# Patient Record
Sex: Female | Born: 1935 | Race: White | Hispanic: No | Smoking: Never smoker
Health system: Southern US, Community
[De-identification: ages and names within clinical notes are randomized; demographics above are authoritative.]

## PROBLEM LIST (undated history)

## (undated) ENCOUNTER — Ambulatory Visit (HOSPITAL_COMMUNITY): Disposition: A | Discharge status: Home / Self Care | Payer: Medicare Other

## (undated) DIAGNOSIS — I5032 Chronic diastolic (congestive) heart failure: Secondary | ICD-10-CM

## (undated) DIAGNOSIS — E039 Hypothyroidism, unspecified: Secondary | ICD-10-CM

## (undated) DIAGNOSIS — R32 Unspecified urinary incontinence: Secondary | ICD-10-CM

## (undated) DIAGNOSIS — R609 Edema, unspecified: Secondary | ICD-10-CM

## (undated) DIAGNOSIS — M1990 Unspecified osteoarthritis, unspecified site: Secondary | ICD-10-CM

## (undated) DIAGNOSIS — I2699 Other pulmonary embolism without acute cor pulmonale: Secondary | ICD-10-CM

## (undated) DIAGNOSIS — I10 Essential (primary) hypertension: Secondary | ICD-10-CM

## (undated) DIAGNOSIS — E119 Type 2 diabetes mellitus without complications: Secondary | ICD-10-CM

## (undated) DIAGNOSIS — E785 Hyperlipidemia, unspecified: Secondary | ICD-10-CM

## (undated) DIAGNOSIS — Z974 Presence of external hearing-aid: Secondary | ICD-10-CM

## (undated) DIAGNOSIS — N183 Chronic kidney disease, stage 3 unspecified: Secondary | ICD-10-CM

## (undated) HISTORY — DX: Edema, unspecified: R60.9

## (undated) HISTORY — DX: Presence of external hearing-aid: Z97.4

## (undated) HISTORY — DX: Other pulmonary embolism without acute cor pulmonale: I26.99

## (undated) HISTORY — PX: EYE SURGERY: SHX253

## (undated) HISTORY — PX: CHOLECYSTECTOMY: SHX55

## (undated) HISTORY — DX: Hypothyroidism, unspecified: E03.9

---
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
## 1997-08-15 HISTORY — PX: OTHER SURGICAL HISTORY: SHX169

## 1998-03-16 ENCOUNTER — Ambulatory Visit (HOSPITAL_COMMUNITY): Admission: RE | Admit: 1998-03-16 | Discharge: 1998-03-16 | Payer: Self-pay | Admitting: Orthopedic Surgery

## 1998-04-03 ENCOUNTER — Inpatient Hospital Stay (HOSPITAL_COMMUNITY): Admission: RE | Admit: 1998-04-03 | Discharge: 1998-04-05 | Payer: Self-pay | Admitting: Orthopedic Surgery

## 1998-08-15 HISTORY — PX: BLADDER SURGERY: SHX569

## 1998-08-18 ENCOUNTER — Other Ambulatory Visit: Admission: RE | Admit: 1998-08-18 | Discharge: 1998-08-18 | Payer: Self-pay | Admitting: Internal Medicine

## 1999-03-01 ENCOUNTER — Encounter: Admission: RE | Admit: 1999-03-01 | Discharge: 1999-03-15 | Payer: Self-pay | Admitting: Internal Medicine

## 1999-03-11 ENCOUNTER — Encounter: Admission: RE | Admit: 1999-03-11 | Discharge: 1999-03-15 | Payer: Self-pay | Admitting: Internal Medicine

## 1999-03-16 ENCOUNTER — Encounter: Admission: RE | Admit: 1999-03-16 | Discharge: 1999-06-14 | Payer: Self-pay | Admitting: Internal Medicine

## 2000-06-11 ENCOUNTER — Emergency Department (HOSPITAL_COMMUNITY): Admission: EM | Admit: 2000-06-11 | Discharge: 2000-06-11 | Payer: Self-pay | Admitting: Emergency Medicine

## 2001-02-28 ENCOUNTER — Encounter (HOSPITAL_BASED_OUTPATIENT_CLINIC_OR_DEPARTMENT_OTHER): Payer: Self-pay | Admitting: Internal Medicine

## 2001-02-28 ENCOUNTER — Encounter: Admission: RE | Admit: 2001-02-28 | Discharge: 2001-02-28 | Payer: Self-pay | Admitting: Internal Medicine

## 2001-10-24 ENCOUNTER — Encounter: Admission: RE | Admit: 2001-10-24 | Discharge: 2002-01-22 | Payer: Self-pay | Admitting: Internal Medicine

## 2002-04-24 ENCOUNTER — Encounter: Admission: RE | Admit: 2002-04-24 | Discharge: 2002-04-24 | Payer: Self-pay | Admitting: Internal Medicine

## 2002-04-24 ENCOUNTER — Encounter: Payer: Self-pay | Admitting: Internal Medicine

## 2002-05-20 ENCOUNTER — Encounter: Payer: Self-pay | Admitting: Urology

## 2002-05-23 ENCOUNTER — Encounter (INDEPENDENT_AMBULATORY_CARE_PROVIDER_SITE_OTHER): Payer: Self-pay | Admitting: Specialist

## 2002-05-23 ENCOUNTER — Observation Stay (HOSPITAL_COMMUNITY): Admission: RE | Admit: 2002-05-23 | Discharge: 2002-05-24 | Payer: Self-pay | Admitting: Urology

## 2006-06-20 ENCOUNTER — Encounter: Admission: RE | Admit: 2006-06-20 | Discharge: 2006-06-20 | Payer: Self-pay | Admitting: Neurology

## 2006-09-08 ENCOUNTER — Encounter: Admission: RE | Admit: 2006-09-08 | Discharge: 2006-09-29 | Payer: Self-pay | Admitting: Neurology

## 2006-10-10 ENCOUNTER — Encounter: Admission: RE | Admit: 2006-10-10 | Discharge: 2007-01-08 | Payer: Self-pay | Admitting: Neurology

## 2006-11-24 ENCOUNTER — Encounter: Admission: RE | Admit: 2006-11-24 | Discharge: 2006-11-24 | Payer: Self-pay | Admitting: Neurology

## 2009-04-22 ENCOUNTER — Encounter: Admission: RE | Admit: 2009-04-22 | Discharge: 2009-07-21 | Payer: Self-pay | Admitting: Orthopaedic Surgery

## 2009-07-23 ENCOUNTER — Encounter: Admission: RE | Admit: 2009-07-23 | Discharge: 2009-10-02 | Payer: Self-pay | Admitting: Orthopaedic Surgery

## 2009-08-03 ENCOUNTER — Encounter: Admission: RE | Admit: 2009-08-03 | Discharge: 2009-08-17 | Payer: Self-pay | Admitting: Orthopaedic Surgery

## 2009-08-28 ENCOUNTER — Encounter: Admission: RE | Admit: 2009-08-28 | Discharge: 2009-11-26 | Payer: Self-pay | Admitting: Orthopaedic Surgery

## 2010-03-22 ENCOUNTER — Ambulatory Visit (HOSPITAL_COMMUNITY): Admission: RE | Admit: 2010-03-22 | Discharge: 2010-03-23 | Payer: Self-pay | Admitting: Surgery

## 2010-03-22 ENCOUNTER — Encounter (INDEPENDENT_AMBULATORY_CARE_PROVIDER_SITE_OTHER): Payer: Self-pay | Admitting: Surgery

## 2010-10-29 LAB — GLUCOSE, CAPILLARY
Glucose-Capillary: 163 mg/dL — ABNORMAL HIGH (ref 70–99)
Glucose-Capillary: 181 mg/dL — ABNORMAL HIGH (ref 70–99)
Glucose-Capillary: 187 mg/dL — ABNORMAL HIGH (ref 70–99)

## 2010-10-30 LAB — DIFFERENTIAL
Basophils Absolute: 0 10*3/uL (ref 0.0–0.1)
Basophils Relative: 1 % (ref 0–1)
Eosinophils Absolute: 0.2 10*3/uL (ref 0.0–0.7)
Eosinophils Relative: 2 % (ref 0–5)
Lymphs Abs: 1.9 10*3/uL (ref 0.7–4.0)
Neutrophils Relative %: 64 % (ref 43–77)

## 2010-10-30 LAB — CBC
HCT: 37.4 % (ref 36.0–46.0)
Hemoglobin: 12.7 g/dL (ref 12.0–15.0)
MCHC: 33.9 g/dL (ref 30.0–36.0)
RBC: 4.16 MIL/uL (ref 3.87–5.11)

## 2010-10-30 LAB — URINALYSIS, ROUTINE W REFLEX MICROSCOPIC
Bilirubin Urine: NEGATIVE
Glucose, UA: NEGATIVE mg/dL
Ketones, ur: NEGATIVE mg/dL
Specific Gravity, Urine: 1.02 (ref 1.005–1.030)
pH: 5.5 (ref 5.0–8.0)

## 2010-10-30 LAB — COMPREHENSIVE METABOLIC PANEL
ALT: 15 U/L (ref 0–35)
AST: 17 U/L (ref 0–37)
Alkaline Phosphatase: 65 U/L (ref 39–117)
CO2: 31 mEq/L (ref 19–32)
Calcium: 10.8 mg/dL — ABNORMAL HIGH (ref 8.4–10.5)
Chloride: 108 mEq/L (ref 96–112)
GFR calc Af Amer: 49 mL/min — ABNORMAL LOW (ref >60)
GFR calc non Af Amer: 40 mL/min — ABNORMAL LOW (ref >60)
Glucose, Bld: 114 mg/dL — ABNORMAL HIGH (ref 70–99)
Sodium: 144 mEq/L (ref 135–145)
Total Bilirubin: 0.9 mg/dL (ref 0.3–1.2)

## 2010-10-30 LAB — SURGICAL PCR SCREEN: Staphylococcus aureus: NEGATIVE

## 2010-10-30 LAB — LIPASE, BLOOD: Lipase: 36 U/L (ref 11–59)

## 2010-12-31 NOTE — Op Note (Signed)
NAME:  Melissa Shannon, Melissa Shannon                          ACCOUNT NO.:  [id number]   MEDICAL RECORD NO.:  [id number]                   PATIENT TYPE:  OBV   LOCATION:  [id number]                                 FACILITY:  [facility]   PHYSICIAN:  Arlean Hopping, M.D.                DATE OF BIRTH:  [birth year]   DATE OF PROCEDURE:  05/23/2002  DATE OF DISCHARGE:                                 OPERATIVE REPORT   PREOPERATIVE DIAGNOSES:  1. Urinary stress incontinence.  2. Vaginal vault prolapse.   POSTOPERATIVE DIAGNOSES:  Not given.   PROCEDURE:  1. SPARC urethral sling.  2. Anterior vaginal vault repair.  3. Excision of cervical polyp.  4. Excision of vaginal polyp.   ANESTHESIA:  General.   SURGEON:  Sigmund I. Gaynelle Arabian, M.D.   ASSISTANT:  Arlean Hopping, M.D.   BRIEF HISTORY:  Melissa Shannon is a 77 year old white female with stress urinary  incontinence and a grade 3 cystocele who has elected for open repair.   DESCRIPTION OF PROCEDURE:  Following the administration of IV antibiotics  and general anesthesia, Melissa Shannon was prepped and draped in the dorsal  lithotomy position. A weighted speculum was placed in the vagina to pull  down the posterior vaginal wall and expose the anterior vaginal wall and  cervix. A Foley catheter was placed sterilely. The Foley catheter balloon  was palpable at the bladder neck through the anterior vaginal wall. At that  time, I began the urethral sling procedure by making a longitudinal incision  in the anterior vaginal mucosa starting proximally at the bladder neck and  extending distally for 1 1/2 cm. A plane was then dissected out submucosally  laterally on both sides of this incision around the urethra. This was  achieved by both blunt and sharp dissection. This was uncomplicated. We then  proceeded to make two small suprapubic incisions at each superior lateral  aspect of the pubic symphysis. These are made sharply using a 10 blade  through the skin. We  then proceeded to pass curved suspension needles from  the abdominal incision down directed out through the previously made  dissections at the anterior vaginal wall. Both the left and right needles  were passed without difficulty. Following passage of the needles, cystoscopy  was performed using both 30 and 70 degree lenses. There was no evidence of  any violation of the bladder by the needles. At that time, the [facility] vaginal  tape was connected to each of the suspension needles. They were both then  slowly pulled upward to the site of both suprapubic skin incisions. They  were pulled upward until the vaginal tape was flushed tension free allowing  passage of a ring clamp between the urethra and the tape. The sleeve of the  tape were then removed while carefully keeping the tape in place. Each end  of the tape were then cut beneath the level of  the skin incisions. A small  piece of cadaveric fascia was then placed over the tape and the mucosal  incision was closed using running 2-0 Vicryl suture. The abdominal incisions  were approximated using Benzoin and Steri-Strips. We then began our vaginal  vault repair by making a longitudinal incision along the anterior bladder  wall starting just distally to the previously made incision. This incision  was approximately 3 cm in length. Then using blunt and sharp dissection, the  cystocele was completely dissected off the overlying vaginal submucosal  tissue creating a good plane circumferentially. This allowed [place] to then  reduce the cystocele by gently pushing it back up into the pelvis. Vertical  mattress sutures were then used to pex the cystocele up in place, this was  performed using the 2-0 Vicryl. While continuing to reduce the cystocele,  the overlying submucosal tissues were reapproximated to keep the cystocele  reduced using mattress sutures with 2-0 Vicryl. Again at that time, the  suture line was covered with a soft piece of cadaveric  fascia which was  sutured in its lateral aspect to the vaginal submucosa. The vaginal mucosa  was then closed using a running 2-0 Vicryl suture. During the course of the  procedure, a small polyp was seen emanating from the cervix. This was  removed using sharp dissection and the stalk was suture ligated.  Additionally we noted a mucosal polyp within the vagina which was removed in  a similar fashion. Both of these were sent to pathology for evaluation. At  the end of the procedure, the vagina was tacked and the Foley catheter was  kept in place.   ESTIMATED BLOOD LOSS:  Minimal.   COMPLICATIONS:  None.   DRAINS:  Foley catheter.   PACKS:  Vaginal packing.   DISPOSITION:  She was taken to the recovery room in stable condition.                                               Arlean Hopping, M.D.    DK/MEDQ  D:  05/23/2002  T:  05/23/2002  Job:  [id number]

## 2011-09-26 DIAGNOSIS — E538 Deficiency of other specified B group vitamins: Secondary | ICD-10-CM | POA: Diagnosis not present

## 2011-09-26 DIAGNOSIS — IMO0001 Reserved for inherently not codable concepts without codable children: Secondary | ICD-10-CM | POA: Diagnosis not present

## 2011-11-01 DIAGNOSIS — E538 Deficiency of other specified B group vitamins: Secondary | ICD-10-CM | POA: Diagnosis not present

## 2011-11-01 DIAGNOSIS — E1149 Type 2 diabetes mellitus with other diabetic neurological complication: Secondary | ICD-10-CM | POA: Diagnosis not present

## 2011-11-01 DIAGNOSIS — G589 Mononeuropathy, unspecified: Secondary | ICD-10-CM | POA: Diagnosis not present

## 2011-12-01 DIAGNOSIS — E1149 Type 2 diabetes mellitus with other diabetic neurological complication: Secondary | ICD-10-CM | POA: Diagnosis not present

## 2011-12-01 DIAGNOSIS — G589 Mononeuropathy, unspecified: Secondary | ICD-10-CM | POA: Diagnosis not present

## 2011-12-01 DIAGNOSIS — E538 Deficiency of other specified B group vitamins: Secondary | ICD-10-CM | POA: Diagnosis not present

## 2012-01-03 DIAGNOSIS — E538 Deficiency of other specified B group vitamins: Secondary | ICD-10-CM | POA: Diagnosis not present

## 2012-02-07 DIAGNOSIS — E538 Deficiency of other specified B group vitamins: Secondary | ICD-10-CM | POA: Diagnosis not present

## 2012-03-08 DIAGNOSIS — E538 Deficiency of other specified B group vitamins: Secondary | ICD-10-CM | POA: Diagnosis not present

## 2012-03-28 DIAGNOSIS — E1149 Type 2 diabetes mellitus with other diabetic neurological complication: Secondary | ICD-10-CM | POA: Diagnosis not present

## 2012-03-28 DIAGNOSIS — E039 Hypothyroidism, unspecified: Secondary | ICD-10-CM | POA: Diagnosis not present

## 2012-03-28 DIAGNOSIS — I10 Essential (primary) hypertension: Secondary | ICD-10-CM | POA: Diagnosis not present

## 2012-03-28 DIAGNOSIS — E785 Hyperlipidemia, unspecified: Secondary | ICD-10-CM | POA: Diagnosis not present

## 2012-03-28 DIAGNOSIS — E538 Deficiency of other specified B group vitamins: Secondary | ICD-10-CM | POA: Diagnosis not present

## 2012-04-12 DIAGNOSIS — E538 Deficiency of other specified B group vitamins: Secondary | ICD-10-CM | POA: Diagnosis not present

## 2012-05-16 DIAGNOSIS — E538 Deficiency of other specified B group vitamins: Secondary | ICD-10-CM | POA: Diagnosis not present

## 2012-06-19 DIAGNOSIS — E538 Deficiency of other specified B group vitamins: Secondary | ICD-10-CM | POA: Diagnosis not present

## 2012-07-19 DIAGNOSIS — E538 Deficiency of other specified B group vitamins: Secondary | ICD-10-CM | POA: Diagnosis not present

## 2012-08-30 DIAGNOSIS — E538 Deficiency of other specified B group vitamins: Secondary | ICD-10-CM | POA: Diagnosis not present

## 2012-10-04 DIAGNOSIS — E538 Deficiency of other specified B group vitamins: Secondary | ICD-10-CM | POA: Diagnosis not present

## 2012-11-07 DIAGNOSIS — E538 Deficiency of other specified B group vitamins: Secondary | ICD-10-CM | POA: Diagnosis not present

## 2012-12-12 DIAGNOSIS — E538 Deficiency of other specified B group vitamins: Secondary | ICD-10-CM | POA: Diagnosis not present

## 2013-01-16 DIAGNOSIS — E538 Deficiency of other specified B group vitamins: Secondary | ICD-10-CM | POA: Diagnosis not present

## 2013-02-11 DIAGNOSIS — H26019 Infantile and juvenile cortical, lamellar, or zonular cataract, unspecified eye: Secondary | ICD-10-CM | POA: Diagnosis not present

## 2013-02-11 DIAGNOSIS — E119 Type 2 diabetes mellitus without complications: Secondary | ICD-10-CM | POA: Diagnosis not present

## 2013-02-11 DIAGNOSIS — H2510 Age-related nuclear cataract, unspecified eye: Secondary | ICD-10-CM | POA: Diagnosis not present

## 2013-02-27 DIAGNOSIS — H2510 Age-related nuclear cataract, unspecified eye: Secondary | ICD-10-CM | POA: Diagnosis not present

## 2013-02-27 DIAGNOSIS — H26019 Infantile and juvenile cortical, lamellar, or zonular cataract, unspecified eye: Secondary | ICD-10-CM | POA: Diagnosis not present

## 2013-03-06 DIAGNOSIS — H26019 Infantile and juvenile cortical, lamellar, or zonular cataract, unspecified eye: Secondary | ICD-10-CM | POA: Diagnosis not present

## 2013-03-06 DIAGNOSIS — H2510 Age-related nuclear cataract, unspecified eye: Secondary | ICD-10-CM | POA: Diagnosis not present

## 2013-03-15 HISTORY — PX: CATARACT EXTRACTION: SUR2

## 2013-03-25 ENCOUNTER — Encounter: Payer: Self-pay | Admitting: Internal Medicine

## 2013-07-04 ENCOUNTER — Emergency Department (HOSPITAL_COMMUNITY)
Admission: EM | Admit: 2013-07-04 | Discharge: 2013-07-04 | Disposition: A | Discharge status: Home / Self Care | Payer: Medicare Other | Attending: Emergency Medicine | Admitting: Emergency Medicine

## 2013-07-04 ENCOUNTER — Encounter (HOSPITAL_COMMUNITY): Payer: Self-pay | Admitting: Emergency Medicine

## 2013-07-04 DIAGNOSIS — E860 Dehydration: Secondary | ICD-10-CM | POA: Diagnosis not present

## 2013-07-04 DIAGNOSIS — I10 Essential (primary) hypertension: Secondary | ICD-10-CM | POA: Insufficient documentation

## 2013-07-04 DIAGNOSIS — E119 Type 2 diabetes mellitus without complications: Secondary | ICD-10-CM | POA: Diagnosis not present

## 2013-07-04 DIAGNOSIS — N390 Urinary tract infection, site not specified: Secondary | ICD-10-CM | POA: Insufficient documentation

## 2013-07-04 DIAGNOSIS — Z8739 Personal history of other diseases of the musculoskeletal system and connective tissue: Secondary | ICD-10-CM | POA: Insufficient documentation

## 2013-07-04 DIAGNOSIS — Z79899 Other long term (current) drug therapy: Secondary | ICD-10-CM | POA: Diagnosis not present

## 2013-07-04 DIAGNOSIS — K5900 Constipation, unspecified: Secondary | ICD-10-CM | POA: Diagnosis not present

## 2013-07-04 DIAGNOSIS — N179 Acute kidney failure, unspecified: Secondary | ICD-10-CM | POA: Insufficient documentation

## 2013-07-04 DIAGNOSIS — R109 Unspecified abdominal pain: Secondary | ICD-10-CM | POA: Diagnosis not present

## 2013-07-04 HISTORY — DX: Essential (primary) hypertension: I10

## 2013-07-04 HISTORY — DX: Type 2 diabetes mellitus without complications: E11.9

## 2013-07-04 HISTORY — DX: Unspecified osteoarthritis, unspecified site: M19.90

## 2013-07-04 LAB — URINALYSIS, ROUTINE W REFLEX MICROSCOPIC
Nitrite: POSITIVE — AB
Specific Gravity, Urine: 1.023 (ref 1.005–1.030)
Urobilinogen, UA: 0.2 mg/dL (ref 0.0–1.0)
pH: 5.5 (ref 5.0–8.0)

## 2013-07-04 LAB — CBC
MCH: 29.3 pg (ref 26.0–34.0)
MCHC: 33.1 g/dL (ref 30.0–36.0)
MCV: 88.6 fL (ref 78.0–100.0)
Platelets: 201 10*3/uL (ref 150–400)
RBC: 4.3 MIL/uL (ref 3.87–5.11)
RDW: 13.2 % (ref 11.5–15.5)

## 2013-07-04 LAB — COMPREHENSIVE METABOLIC PANEL
ALT: 10 U/L (ref 0–35)
AST: 12 U/L (ref 0–37)
CO2: 25 mEq/L (ref 19–32)
Calcium: 10.9 mg/dL — ABNORMAL HIGH (ref 8.4–10.5)
Creatinine, Ser: 1.61 mg/dL — ABNORMAL HIGH (ref 0.50–1.10)
GFR calc Af Amer: 33 mL/min — ABNORMAL LOW (ref >90)
GFR calc non Af Amer: 28 mL/min — ABNORMAL LOW (ref >90)
Sodium: 138 mEq/L (ref 135–145)
Total Protein: 6.9 g/dL (ref 6.0–8.3)

## 2013-07-04 LAB — URINE MICROSCOPIC-ADD ON

## 2013-07-04 MED ORDER — LIDOCAINE HCL 2 % EX GEL
Freq: Once | CUTANEOUS | Status: DC
  Filled 2013-07-04: qty 10

## 2013-07-04 MED ORDER — DEXTROSE 5 % IV SOLN
1.0000 g | Freq: Once | INTRAVENOUS | Status: AC
  Administered 2013-07-04: 1 g via INTRAVENOUS
  Filled 2013-07-04: qty 10

## 2013-07-04 MED ORDER — SORBITOL 70 % SOLN
960.0000 mL | TOPICAL_OIL | Freq: Once | ORAL | Status: AC
  Administered 2013-07-04: 960 mL via RECTAL
  Filled 2013-07-04: qty 240

## 2013-07-04 MED ORDER — SODIUM CHLORIDE 0.9 % IV BOLUS (SEPSIS)
1000.0000 mL | Freq: Once | INTRAVENOUS | Status: AC
  Administered 2013-07-04: 1000 mL via INTRAVENOUS

## 2013-07-04 MED ORDER — CEFPODOXIME PROXETIL 100 MG PO TABS: 100.0000 mg | ORAL_TABLET | Freq: Two times a day (BID) | ORAL | Status: DC

## 2013-07-04 NOTE — ED Notes (Signed)
Pt will be up for discharged but will be able to go after antibiotics finish.

## 2013-07-04 NOTE — ED Provider Notes (Signed)
TIME SEEN: 12:45 PM  CHIEF COMPLAINT: Constipation  HPI: Patient is a 79 y.o. female with a history of hypertension, diabetes who presents emergency Department constipation. She reports her last normal bowel movement was 4 days ago. She normally has a bowel movement everyday. She did try taking one stool softener 2 days ago with no relief. Denies any fevers, chills, nausea, vomiting, chest pain or shortness of breath. No recent changes in medication. She's not on narcotics. She states that she does not feel like she has been drinking as well as she should and this is why she is constipated. She states she is having some discomfort in her abdomen but does not describe it as pain. She is passing gas.  ROS: See HPI Constitutional: no fever  Eyes: no drainage  ENT: no runny nose   Cardiovascular:  no chest pain  Resp: no SOB  GI: no vomiting GU: no dysuria Integumentary: no rash  Allergy: no hives  Musculoskeletal: no leg swelling  Neurological: no slurred speech ROS otherwise negative  PAST MEDICAL HISTORY/PAST SURGICAL HISTORY:  Past Medical History  Diagnosis Date  . Hypertension   . Diabetes mellitus without complication   . Arthritis     MEDICATIONS:  Prior to Admission medications   Medication Sig Start Date End Date Taking? Authorizing Provider  amLODipine-benazepril (LOTREL) 5-20 MG per capsule Take 1 capsule by mouth daily.   Yes Historical Provider, MD  bisoprolol-hydrochlorothiazide (ZIAC) 10-6.25 MG per tablet Take 1 tablet by mouth 2 (two) times daily.   Yes Historical Provider, MD  docusate sodium (COLACE) 100 MG capsule Take 100 mg by mouth daily as needed for mild constipation.   Yes Historical Provider, MD  levothyroxine (SYNTHROID, LEVOTHROID) 100 MCG tablet Take 100 mcg by mouth daily before breakfast.   Yes Historical Provider, MD  metFORMIN (GLUCOPHAGE) 500 MG tablet Take 500 mg by mouth 2 (two) times daily with a meal.   Yes Historical Provider, MD  rosuvastatin  (CRESTOR) 20 MG tablet Take 20 mg by mouth daily.   Yes Historical Provider, MD    ALLERGIES:  No Known Allergies  SOCIAL HISTORY:  History  Substance Use Topics  . Smoking status: Never Smoker   . Smokeless tobacco: Never Used  . Alcohol Use: Not on file    FAMILY HISTORY: History reviewed. No pertinent family history.  EXAM: BP 113/48  Pulse 68  Temp(Src) 99 F (37.2 C) (Oral)  Resp 20  Ht 5\' 7"  (1.702 m)  Wt 245 lb (111.131 kg)  BMI 38.36 kg/m2  SpO2 94% CONSTITUTIONAL: Alert and oriented and responds appropriately to questions. Well-appearing; well-nourished HEAD: Normocephalic EYES: Conjunctivae clear, PERRL ENT: normal nose; no rhinorrhea; moist mucous membranes; pharynx without lesions noted NECK: Supple, no meningismus, no LAD  CARD: RRR; S1 and S2 appreciated; no murmurs, no clicks, no rubs, no gallops RESP: Normal chest excursion without splinting or tachypnea; breath sounds clear and equal bilaterally; no wheezes, no rhonchi, no rales,  ABD/GI: Normal bowel sounds; non-distended; soft, non-tender, no rebound, no guarding RECTAL:  Patient has a large stool ball in her rectal vault which is soft, she is unable to tolerate digital disimpaction; no gross blood or melena BACK:  The back appears normal and is non-tender to palpation, there is no CVA tenderness EXT: Normal ROM in all joints; non-tender to palpation; no edema; normal capillary refill; no cyanosis    SKIN: Normal color for age and race; warm NEURO: Moves all extremities equally PSYCH: The patient's mood  and manner are appropriate. Grooming and personal hygiene are appropriate.  MEDICAL DECISION MAKING: Patient with constipation, fecal impaction. She is unable to tolerate disimpaction in the ED. Given her age and comorbidities, we'll check basic blood work, urine. We'll give IV fluids and enema and reassess. I have reviewed her abdominal xray from Urgent Care today.  X-ray shows no sign of small bowel  obstruction, free air.  There is large stool burden diffusely but there is gas throughout the bowel.  ED PROGRESS: Patient has urinary tract infection. Urine culture pending. Will give ceftriaxone. Suspect her urinary tract infection may be contributing to her constipation. I have disimpacted a large amount of stool from her rectal vault. Will continue enema.   Patient was able to have a large bowel movement and reports she feels much better. We'll discharge home with return precautions, instructions for increasing fluid intake and fiber intake, using MiraLax and Colace over-the-counter. Given return precautions and PCP followup. Patient verbalizes understanding and is comfortable with plan.  [place], DO 07/04/13 [phone]

## 2013-07-04 NOTE — ED Notes (Signed)
Pt on commode having BM now.

## 2013-07-04 NOTE — ED Notes (Addendum)
Patient reports that she last had a BM 4 days ago and it was normal. Patient reports that she did take a stool softener 2 days ago with no results. Patient denies any N/V. Patient c/o feeling tightness in her abdomen. Patient states that she went to an Urgent Care this AM and brought a CD of x-rays.

## 2013-07-04 NOTE — ED Notes (Signed)
Bed: [id number] Expected date:  Expected time:  Means of arrival:  Comments: [place]

## 2013-07-08 DIAGNOSIS — Z961 Presence of intraocular lens: Secondary | ICD-10-CM | POA: Diagnosis not present

## 2013-07-08 LAB — URINE CULTURE: Colony Count: >=100000

## 2014-05-28 DIAGNOSIS — Z1389 Encounter for screening for other disorder: Secondary | ICD-10-CM | POA: Diagnosis not present

## 2014-05-28 DIAGNOSIS — E039 Hypothyroidism, unspecified: Secondary | ICD-10-CM | POA: Diagnosis not present

## 2014-05-28 DIAGNOSIS — I10 Essential (primary) hypertension: Secondary | ICD-10-CM | POA: Diagnosis not present

## 2014-05-28 DIAGNOSIS — E785 Hyperlipidemia, unspecified: Secondary | ICD-10-CM | POA: Diagnosis not present

## 2014-05-28 DIAGNOSIS — Z79899 Other long term (current) drug therapy: Secondary | ICD-10-CM | POA: Diagnosis not present

## 2014-05-28 DIAGNOSIS — E538 Deficiency of other specified B group vitamins: Secondary | ICD-10-CM | POA: Diagnosis not present

## 2014-05-28 DIAGNOSIS — E1149 Type 2 diabetes mellitus with other diabetic neurological complication: Secondary | ICD-10-CM | POA: Diagnosis not present

## 2014-05-28 DIAGNOSIS — E559 Vitamin D deficiency, unspecified: Secondary | ICD-10-CM | POA: Diagnosis not present

## 2014-05-28 DIAGNOSIS — M8580 Other specified disorders of bone density and structure, unspecified site: Secondary | ICD-10-CM | POA: Diagnosis not present

## 2014-06-11 ENCOUNTER — Emergency Department (HOSPITAL_COMMUNITY): Payer: Medicare Other

## 2014-06-11 ENCOUNTER — Inpatient Hospital Stay (HOSPITAL_COMMUNITY)
Admission: EM | Admit: 2014-06-11 | Discharge: 2014-06-19 | DRG: 291 | Disposition: A | Discharge status: Home Health | Payer: Medicare Other | Attending: Internal Medicine | Admitting: Internal Medicine

## 2014-06-11 ENCOUNTER — Encounter (HOSPITAL_COMMUNITY): Payer: Self-pay | Admitting: Emergency Medicine

## 2014-06-11 DIAGNOSIS — R911 Solitary pulmonary nodule: Secondary | ICD-10-CM | POA: Diagnosis present

## 2014-06-11 DIAGNOSIS — M1990 Unspecified osteoarthritis, unspecified site: Secondary | ICD-10-CM | POA: Diagnosis present

## 2014-06-11 DIAGNOSIS — E669 Obesity, unspecified: Secondary | ICD-10-CM | POA: Diagnosis present

## 2014-06-11 DIAGNOSIS — Z880 Allergy status to penicillin: Secondary | ICD-10-CM

## 2014-06-11 DIAGNOSIS — E440 Moderate protein-calorie malnutrition: Secondary | ICD-10-CM | POA: Diagnosis present

## 2014-06-11 DIAGNOSIS — M7989 Other specified soft tissue disorders: Secondary | ICD-10-CM | POA: Diagnosis not present

## 2014-06-11 DIAGNOSIS — I509 Heart failure, unspecified: Secondary | ICD-10-CM | POA: Diagnosis not present

## 2014-06-11 DIAGNOSIS — Z6833 Body mass index (BMI) 33.0-33.9, adult: Secondary | ICD-10-CM | POA: Diagnosis not present

## 2014-06-11 DIAGNOSIS — I5031 Acute diastolic (congestive) heart failure: Secondary | ICD-10-CM | POA: Diagnosis not present

## 2014-06-11 DIAGNOSIS — R918 Other nonspecific abnormal finding of lung field: Secondary | ICD-10-CM | POA: Diagnosis not present

## 2014-06-11 DIAGNOSIS — R0600 Dyspnea, unspecified: Secondary | ICD-10-CM | POA: Diagnosis not present

## 2014-06-11 DIAGNOSIS — I129 Hypertensive chronic kidney disease with stage 1 through stage 4 chronic kidney disease, or unspecified chronic kidney disease: Secondary | ICD-10-CM | POA: Diagnosis present

## 2014-06-11 DIAGNOSIS — Z79899 Other long term (current) drug therapy: Secondary | ICD-10-CM

## 2014-06-11 DIAGNOSIS — I519 Heart disease, unspecified: Secondary | ICD-10-CM | POA: Diagnosis not present

## 2014-06-11 DIAGNOSIS — I270 Primary pulmonary hypertension: Secondary | ICD-10-CM | POA: Diagnosis not present

## 2014-06-11 DIAGNOSIS — N183 Chronic kidney disease, stage 3 (moderate): Secondary | ICD-10-CM | POA: Diagnosis present

## 2014-06-11 DIAGNOSIS — I951 Orthostatic hypotension: Secondary | ICD-10-CM | POA: Diagnosis not present

## 2014-06-11 DIAGNOSIS — J9601 Acute respiratory failure with hypoxia: Secondary | ICD-10-CM

## 2014-06-11 DIAGNOSIS — R748 Abnormal levels of other serum enzymes: Secondary | ICD-10-CM | POA: Diagnosis present

## 2014-06-11 DIAGNOSIS — E039 Hypothyroidism, unspecified: Secondary | ICD-10-CM | POA: Diagnosis present

## 2014-06-11 DIAGNOSIS — R0602 Shortness of breath: Secondary | ICD-10-CM

## 2014-06-11 DIAGNOSIS — I272 Other secondary pulmonary hypertension: Secondary | ICD-10-CM | POA: Diagnosis present

## 2014-06-11 DIAGNOSIS — E119 Type 2 diabetes mellitus without complications: Secondary | ICD-10-CM | POA: Diagnosis present

## 2014-06-11 DIAGNOSIS — R0902 Hypoxemia: Secondary | ICD-10-CM | POA: Diagnosis present

## 2014-06-11 DIAGNOSIS — R634 Abnormal weight loss: Secondary | ICD-10-CM | POA: Diagnosis present

## 2014-06-11 DIAGNOSIS — R42 Dizziness and giddiness: Secondary | ICD-10-CM | POA: Diagnosis not present

## 2014-06-11 DIAGNOSIS — E785 Hyperlipidemia, unspecified: Secondary | ICD-10-CM | POA: Diagnosis present

## 2014-06-11 DIAGNOSIS — I248 Other forms of acute ischemic heart disease: Secondary | ICD-10-CM | POA: Diagnosis present

## 2014-06-11 DIAGNOSIS — I2729 Other secondary pulmonary hypertension: Secondary | ICD-10-CM

## 2014-06-11 DIAGNOSIS — I10 Essential (primary) hypertension: Secondary | ICD-10-CM | POA: Diagnosis not present

## 2014-06-11 DIAGNOSIS — I50810 Right heart failure, unspecified: Secondary | ICD-10-CM

## 2014-06-11 DIAGNOSIS — J984 Other disorders of lung: Secondary | ICD-10-CM | POA: Diagnosis not present

## 2014-06-11 DIAGNOSIS — N179 Acute kidney failure, unspecified: Secondary | ICD-10-CM | POA: Diagnosis not present

## 2014-06-11 DIAGNOSIS — I5023 Acute on chronic systolic (congestive) heart failure: Secondary | ICD-10-CM | POA: Diagnosis not present

## 2014-06-11 DIAGNOSIS — I5032 Chronic diastolic (congestive) heart failure: Secondary | ICD-10-CM | POA: Diagnosis not present

## 2014-06-11 HISTORY — DX: Unspecified urinary incontinence: R32

## 2014-06-11 HISTORY — DX: Hyperlipidemia, unspecified: E78.5

## 2014-06-11 LAB — BASIC METABOLIC PANEL
Anion gap: 16 — ABNORMAL HIGH (ref 5–15)
BUN: 30 mg/dL — ABNORMAL HIGH (ref 6–23)
CO2: 23 mEq/L (ref 19–32)
Calcium: 10.1 mg/dL (ref 8.4–10.5)
Chloride: 102 mEq/L (ref 96–112)
Creatinine, Ser: 1.52 mg/dL — ABNORMAL HIGH (ref 0.50–1.10)
GFR calc Af Amer: 35 mL/min — ABNORMAL LOW (ref >90)
GFR calc non Af Amer: 30 mL/min — ABNORMAL LOW (ref >90)
Glucose, Bld: 136 mg/dL — ABNORMAL HIGH (ref 70–99)
Potassium: 4.3 mEq/L (ref 3.7–5.3)
Sodium: 141 mEq/L (ref 137–147)

## 2014-06-11 LAB — CBC
HCT: 38.6 % (ref 36.0–46.0)
HEMATOCRIT: 36.3 % (ref 36.0–46.0)
HEMOGLOBIN: 11.8 g/dL — AB (ref 12.0–15.0)
Hemoglobin: 12.6 g/dL (ref 12.0–15.0)
MCH: 29.4 pg (ref 26.0–34.0)
MCH: 29.1 pg (ref 26.0–34.0)
MCHC: 32.6 g/dL (ref 30.0–36.0)
MCHC: 32.5 g/dL (ref 30.0–36.0)
MCV: 90 fL (ref 78.0–100.0)
MCV: 89.6 fL (ref 78.0–100.0)
Platelets: 173 10*3/uL (ref 150–400)
Platelets: 166 10*3/uL (ref 150–400)
RBC: 4.29 MIL/uL (ref 3.87–5.11)
RBC: 4.05 MIL/uL (ref 3.87–5.11)
RDW: 13.1 % (ref 11.5–15.5)
RDW: 13 % (ref 11.5–15.5)
WBC: 8.7 10*3/uL (ref 4.0–10.5)
WBC: 9 10*3/uL (ref 4.0–10.5)

## 2014-06-11 LAB — PRO B NATRIURETIC PEPTIDE: Pro B Natriuretic peptide (BNP): 10167 pg/mL — ABNORMAL HIGH (ref 0–450)

## 2014-06-11 LAB — CREATININE, SERUM
Creatinine, Ser: 1.67 mg/dL — ABNORMAL HIGH (ref 0.50–1.10)
GFR, EST AFRICAN AMERICAN: 31 mL/min — AB (ref >90)
GFR, EST NON AFRICAN AMERICAN: 27 mL/min — AB (ref >90)

## 2014-06-11 LAB — I-STAT TROPONIN, ED: Troponin i, poc: 0.3 ng/mL (ref 0.00–0.08)

## 2014-06-11 LAB — TROPONIN I: TROPONIN I: 0.66 ng/mL — AB (ref <0.30)

## 2014-06-11 MED ORDER — ASPIRIN 81 MG PO CHEW
324.0000 mg | CHEWABLE_TABLET | Freq: Once | ORAL | Status: AC
  Administered 2014-06-11: 324 mg via ORAL
  Filled 2014-06-11: qty 4

## 2014-06-11 MED ORDER — ASPIRIN 325 MG PO TABS: 325.0000 mg | ORAL_TABLET | Freq: Once | ORAL | Status: DC

## 2014-06-11 MED ORDER — ACETAMINOPHEN 650 MG RE SUPP: 650.0000 mg | Freq: Four times a day (QID) | RECTAL | Status: DC | PRN

## 2014-06-11 MED ORDER — ROSUVASTATIN CALCIUM 20 MG PO TABS
20.0000 mg | ORAL_TABLET | Freq: Every day | ORAL | Status: DC
  Administered 2014-06-12 – 2014-06-18 (×7): 20 mg via ORAL
  Filled 2014-06-11 (×8): qty 1

## 2014-06-11 MED ORDER — ONDANSETRON HCL 4 MG/2ML IJ SOLN: 4.0000 mg | Freq: Four times a day (QID) | INTRAMUSCULAR | Status: DC | PRN

## 2014-06-11 MED ORDER — METFORMIN HCL 500 MG PO TABS: 500.0000 mg | ORAL_TABLET | Freq: Two times a day (BID) | ORAL | Status: DC

## 2014-06-11 MED ORDER — LEVOTHYROXINE SODIUM 100 MCG PO TABS
100.0000 ug | ORAL_TABLET | Freq: Every day | ORAL | Status: DC
  Administered 2014-06-12 – 2014-06-19 (×8): 100 ug via ORAL
  Filled 2014-06-11 (×9): qty 1

## 2014-06-11 MED ORDER — BISOPROLOL-HYDROCHLOROTHIAZIDE 10-6.25 MG PO TABS
1.0000 | ORAL_TABLET | Freq: Two times a day (BID) | ORAL | Status: DC
  Filled 2014-06-11 (×3): qty 1

## 2014-06-11 MED ORDER — SODIUM CHLORIDE 0.9 % IJ SOLN
3.0000 mL | Freq: Two times a day (BID) | INTRAMUSCULAR | Status: DC
  Administered 2014-06-11 – 2014-06-17 (×7): 3 mL via INTRAVENOUS

## 2014-06-11 MED ORDER — FUROSEMIDE 10 MG/ML IJ SOLN
40.0000 mg | Freq: Every day | INTRAMUSCULAR | Status: DC
  Administered 2014-06-11 – 2014-06-13 (×3): 40 mg via INTRAVENOUS
  Filled 2014-06-11 (×3): qty 4

## 2014-06-11 MED ORDER — HEPARIN SODIUM (PORCINE) 5000 UNIT/ML IJ SOLN
5000.0000 [IU] | Freq: Three times a day (TID) | INTRAMUSCULAR | Status: DC
  Administered 2014-06-11 – 2014-06-14 (×8): 5000 [IU] via SUBCUTANEOUS
  Filled 2014-06-11 (×11): qty 1

## 2014-06-11 MED ORDER — HEPARIN BOLUS VIA INFUSION
4000.0000 [IU] | Freq: Once | INTRAVENOUS | Status: DC
  Filled 2014-06-11: qty 4000

## 2014-06-11 MED ORDER — ONDANSETRON HCL 4 MG PO TABS: 4.0000 mg | ORAL_TABLET | Freq: Four times a day (QID) | ORAL | Status: DC | PRN

## 2014-06-11 MED ORDER — AMLODIPINE BESY-BENAZEPRIL HCL 5-20 MG PO CAPS: 1.0000 | ORAL_CAPSULE | Freq: Two times a day (BID) | ORAL | Status: DC

## 2014-06-11 MED ORDER — SODIUM CHLORIDE 0.9 % IJ SOLN: 3.0000 mL | INTRAMUSCULAR | Status: DC | PRN

## 2014-06-11 MED ORDER — SODIUM CHLORIDE 0.9 % IV SOLN: 250.0000 mL | INTRAVENOUS | Status: DC | PRN

## 2014-06-11 MED ORDER — BENAZEPRIL HCL 20 MG PO TABS
20.0000 mg | ORAL_TABLET | Freq: Every day | ORAL | Status: DC
  Filled 2014-06-11: qty 1

## 2014-06-11 MED ORDER — FOLIC ACID 1 MG PO TABS
1.0000 mg | ORAL_TABLET | Freq: Every day | ORAL | Status: DC
  Administered 2014-06-11 – 2014-06-19 (×9): 1 mg via ORAL
  Filled 2014-06-11 (×9): qty 1

## 2014-06-11 MED ORDER — ZOLPIDEM TARTRATE 5 MG PO TABS: 5.0000 mg | ORAL_TABLET | Freq: Every evening | ORAL | Status: DC | PRN

## 2014-06-11 MED ORDER — AMLODIPINE BESYLATE 5 MG PO TABS
5.0000 mg | ORAL_TABLET | Freq: Every day | ORAL | Status: DC
  Filled 2014-06-11: qty 1

## 2014-06-11 MED ORDER — VITAMIN B-1 100 MG PO TABS
100.0000 mg | ORAL_TABLET | Freq: Every day | ORAL | Status: DC
  Administered 2014-06-11 – 2014-06-19 (×9): 100 mg via ORAL
  Filled 2014-06-11 (×9): qty 1

## 2014-06-11 MED ORDER — ACETAMINOPHEN 325 MG PO TABS: 650.0000 mg | ORAL_TABLET | Freq: Four times a day (QID) | ORAL | Status: DC | PRN

## 2014-06-11 MED ORDER — SODIUM CHLORIDE 0.9 % IJ SOLN
3.0000 mL | Freq: Two times a day (BID) | INTRAMUSCULAR | Status: DC
  Administered 2014-06-11 – 2014-06-18 (×8): 3 mL via INTRAVENOUS

## 2014-06-11 MED ORDER — HEPARIN (PORCINE) IN NACL 100-0.45 UNIT/ML-% IJ SOLN: 1200.0000 [IU]/h | INTRAMUSCULAR | Status: DC

## 2014-06-11 MED ORDER — ADULT MULTIVITAMIN W/MINERALS CH
1.0000 | ORAL_TABLET | Freq: Every day | ORAL | Status: DC
  Administered 2014-06-11 – 2014-06-19 (×9): 1 via ORAL
  Filled 2014-06-11 (×9): qty 1

## 2014-06-11 MED ORDER — FUROSEMIDE 10 MG/ML IJ SOLN
40.0000 mg | Freq: Once | INTRAMUSCULAR | Status: AC
  Administered 2014-06-11: 40 mg via INTRAVENOUS
  Filled 2014-06-11: qty 4

## 2014-06-11 MED ORDER — ASPIRIN EC 325 MG PO TBEC
325.0000 mg | DELAYED_RELEASE_TABLET | Freq: Every day | ORAL | Status: DC
  Administered 2014-06-11 – 2014-06-14 (×4): 325 mg via ORAL
  Filled 2014-06-11 (×4): qty 1

## 2014-06-11 NOTE — ED Notes (Signed)
Pt. i-stat ctnl was 0.30. Results were shown to Dr. Mingo Amber. Nurse was notified.

## 2014-06-11 NOTE — ED Notes (Signed)
Pt was given a [place] sandwich and a Diet coke with permission from [employer].

## 2014-06-11 NOTE — Progress Notes (Signed)
CRITICAL VALUE ALERT  Critical value received:  Troponin 0.66  Date of notification:  06/11/2014  Time of notification:  2320  Critical value read back:Yes.    Nurse who received alert:  Carnella Guadal[place]ara I  MD notified (1st page):  Laurelyn Sickle, MD  Time of first page:  2342  Responding MD:  Laurelyn Sickle, MD  Time MD responded:  2352  MD acknowledged lab value, no new orders given, will continue to monitor pt closely.

## 2014-06-11 NOTE — Progress Notes (Signed)
Pt transported to 4th floor and ambulated to bed with 2 assist. VSS at this time. Daughter at bedside, no distress noted. Belongings with daughter. Will continue to monitor pt and carry out POC. Carnella Gu[place]ara I

## 2014-06-11 NOTE — ED Notes (Signed)
Pt states that she has been having increased SOB since Monday.  Denies CP.  Pt's large legs are baseline for her.  Denies any increased swelling.  States that she has never been SOB before.

## 2014-06-11 NOTE — ED Provider Notes (Signed)
CSN: [id number]     Arrival date & time 06/11/14  1709 History   First MD Initiated Contact with Patient 06/11/14 1725     Chief Complaint  Patient presents with  . Shortness of Breath     (Consider location/radiation/quality/duration/timing/severity/associated sxs/prior Treatment) Patient is a 80 y.o. female presenting with shortness of breath. The history is provided by the patient.  Shortness of Breath Severity:  Moderate Onset quality:  Gradual Duration:  2 days Timing:  Constant Progression:  Worsening Chronicity:  New Context: not URI   Relieved by:  Nothing Worsened by:  Exertion Associated symptoms: no abdominal pain, no cough, no fever and no vomiting     Past Medical History  Diagnosis Date  . Hypertension   . Diabetes mellitus without complication   . Arthritis    Past Surgical History  Procedure Laterality Date  . Cholecystectomy    . Tonsillectomy    . Pilonidal cyst excision     History reviewed. No pertinent family history. History  Substance Use Topics  . Smoking status: Never Smoker   . Smokeless tobacco: Never Used  . Alcohol Use: Not on file   OB History   Grav Para Term Preterm Abortions TAB SAB Ect Mult Living                 Review of Systems  Constitutional: Negative for fever and chills.  Respiratory: Negative for cough and shortness of breath.   Gastrointestinal: Negative for vomiting and abdominal pain.  All other systems reviewed and are negative.     Allergies  Review of patient's allergies indicates no known allergies.  Home Medications   Prior to Admission medications   Medication Sig Start Date End Date Taking? Authorizing Provider  amLODipine-benazepril (LOTREL) 5-20 MG per capsule Take 1 capsule by mouth 2 (two) times daily.    Yes Historical Provider, MD  bisoprolol-hydrochlorothiazide (ZIAC) 10-6.25 MG per tablet Take 1 tablet by mouth 2 (two) times daily.   Yes Historical Provider, MD  Cholecalciferol (VITAMIN D3)  5000 UNITS CHEW Chew 1 tablet by mouth daily.   Yes Historical Provider, MD  levothyroxine (SYNTHROID, LEVOTHROID) 100 MCG tablet Take 100 mcg by mouth daily before breakfast.   Yes Historical Provider, MD  metFORMIN (GLUCOPHAGE) 500 MG tablet Take 500 mg by mouth 2 (two) times daily with a meal.   Yes Historical Provider, MD  rosuvastatin (CRESTOR) 20 MG tablet Take 20 mg by mouth daily.   Yes Historical Provider, MD   BP 128/71  Pulse 79  Temp(Src) 98.6 F (37 C) (Oral)  Resp 18  SpO2 97% Physical Exam  Nursing note and vitals reviewed. Constitutional: She is oriented to person, place, and time. She appears well-developed and well-nourished. No distress.  HENT:  Head: Normocephalic and atraumatic.  Mouth/Throat: Oropharynx is clear and moist.  Eyes: EOM are normal. Pupils are equal, round, and reactive to light.  Neck: Normal range of motion. Neck supple.  Cardiovascular: Normal rate and regular rhythm.  Exam reveals no friction rub.   No murmur heard. Pulmonary/Chest: Effort normal and breath sounds normal. No respiratory distress. She has no wheezes. She has no rales.  Abdominal: Soft. She exhibits no distension. There is no tenderness. There is no rebound.  Musculoskeletal: Normal range of motion. She exhibits no edema.  Neurological: She is alert and oriented to person, place, and time. No cranial nerve deficit. She exhibits normal muscle tone. Coordination normal.  Skin: No rash noted. She is not diaphoretic.  ED Course  Procedures (including critical care time) Labs Review Labs Reviewed  [place], ED    Imaging Review Dg Chest Port 1 View  06/11/2014   CLINICAL DATA:  Shortness of breath x2 days.  EXAM: PORTABLE CHEST - 1 VIEW  COMPARISON:  03/11/2010.  FINDINGS: Mediastinum hilar structures normal. Diffuse mild interstitial prominence noted suggesting mild pneumonitis. Heart size normal. Pulmonary  vascularity is normal. No pleural effusion or pneumothorax. No acute osseous abnormality. Degenerative changes both shoulders. Degenerative changes thoracic spine.  IMPRESSION: Diffuse mild interstitial prominence suggesting mild pneumonitis .   Electronically Signed   By: Marcello Moores  Register   On: 06/11/2014 18:04     EKG Interpretation None      Date: 06/11/2014  Rate: 80  Rhythm: normal sinus rhythm  QRS Axis: normal  Intervals: normal  ST/T Wave abnormalities: normal  Conduction Disutrbances:none  Narrative Interpretation:   Old EKG Reviewed: none available    MDM   Final diagnoses:  Acute congestive heart failure, unspecified congestive heart failure type    51F presents with SOB since Monday. No fevers, cough, CP. DOE and unable to ambulate without assistance which is new. Afebrile, normotensive here. Hypoxic in triage - 87%, improved on 2 L [place]. Doesn't use oxygen at baseline. Lungs clear, no rales, no rhonchi. Will check labs. CXR shows mild pneumonitis, but not pneumonia. Troponin mildlly elevated, BNP majorly elevated. I spoke with Dr. Wynonia Lawman who recommended Hospitalist admission.  Dr. Humphrey Rolls admitting.  Evelina Bucy, MD 06/11/14 [phone]

## 2014-06-11 NOTE — H&P (Signed)
[facility] History and Physical  Melissa Shannon [id number] DOB: Jun 02, 1928 DOA: 06/11/2014  Referring physician: Evelina Bucy, MD PCP: No primary provider on file.   Chief Complaint: Shortness of Breath  HPI: Melissa Shannon is a 80 y.o. female presents with shortness of breath. She states that she has been short of breath since Monday. She has had no chest pain. She has no fevers or chills. No cough or congestion noted. She has chronic edema of her legs. She has no headache dizziness. No prior history of heart failure. She states that she has no cardiac issues in the past. She has no stroke in the past. She states that since she has been in the ED her symptoms have resolved. She is a diabetic and has hypertension.   Review of Systems:  Constitutional:  No weight loss, night sweats, Fevers, chills, fatigue.  HEENT:  No headaches, Difficulty swallowing  Cardio-vascular:  No chest pain, Orthopnea, PND, ++swelling in lower extremities  GI:  No heartburn, indigestion, abdominal pain, nausea, vomiting, diarrhea Resp:  ++shortness of breath with exertion. no productive cough Skin:  no rash or lesions GU:  no dysuria, change in color of urine.  Musculoskeletal:  ++arthritis  Psych:  No change in mood or affect. No depression or anxiety. No memory loss.   Past Medical History  Diagnosis Date  . Hypertension   . Diabetes mellitus without complication   . Arthritis   . Hyperlipidemia   . Urinary incontinence    Past Surgical History  Procedure Laterality Date  . Cholecystectomy    . Tonsillectomy    . Pilonidal cyst excision    . Eye surgery      bilateral cataracts  . Right elbow      x 2, still has one piece of metal in it   Social History:  reports that she has never smoked. She has never used smokeless tobacco. She reports that she does not drink alcohol or use illicit drugs.  Allergies  Allergen Reactions  . Penicillins Other (See Comments)    Throat felt  tight    History reviewed. No pertinent family history.   Prior to Admission medications   Medication Sig Start Date End Date Taking? Authorizing Provider  amLODipine-benazepril (LOTREL) 5-20 MG per capsule Take 1 capsule by mouth 2 (two) times daily.    Yes Historical Provider, MD  bisoprolol-hydrochlorothiazide (ZIAC) 10-6.25 MG per tablet Take 1 tablet by mouth 2 (two) times daily.   Yes Historical Provider, MD  Cholecalciferol (VITAMIN D3) 5000 UNITS CHEW Chew 1 tablet by mouth daily.   Yes Historical Provider, MD  levothyroxine (SYNTHROID, LEVOTHROID) 100 MCG tablet Take 100 mcg by mouth daily before breakfast.   Yes Historical Provider, MD  metFORMIN (GLUCOPHAGE) 500 MG tablet Take 500 mg by mouth 2 (two) times daily with a meal.   Yes Historical Provider, MD  rosuvastatin (CRESTOR) 20 MG tablet Take 20 mg by mouth daily.   Yes Historical Provider, MD   Physical Exam: Filed Vitals:   06/11/14 1735 06/11/14 1736 06/11/14 1839 06/11/14 1941  BP:    101/53  Pulse:    70  Temp:      TempSrc:      Resp:   16 20  SpO2: 87% 97% 98% 100%    Wt Readings from Last 3 Encounters:  07/04/13 111.131 kg (245 lb)    General:  Appears calm and comfortable Eyes: PERRL, normal lids, irises & conjunctiva ENT: grossly normal hearing, lips &  tongue Neck: no LAD, masses or thyromegaly Cardiovascular: RRR, no m/r/g. Chronic LE edema. Telemetry: SR, no arrhythmias Respiratory: few crackles bilaterally, Normal respiratory effort. Abdomen: soft, ntnd Skin: no rash or induration seen on limited exam Musculoskeletal: grossly normal tone BUE/BLE Psychiatric: grossly normal mood and affect, speech fluent and appropriate Neurologic: grossly non-focal.          Labs on Admission:  Basic Metabolic Panel:  Recent Labs Lab 06/11/14 1814  NA 141  K 4.3  CL 102  CO2 23  GLUCOSE 136*  BUN 30*  CREATININE 1.52*  CALCIUM 10.1   Liver Function Tests: No results found for this basename: AST,  ALT, ALKPHOS, BILITOT, PROT, ALBUMIN,  in the last 168 hours No results found for this basename: LIPASE, AMYLASE,  in the last 168 hours No results found for this basename: AMMONIA,  in the last 168 hours CBC:  Recent Labs Lab 06/11/14 1814  WBC 8.7  HGB 12.6  HCT 38.6  MCV 90.0  PLT 173   Cardiac Enzymes: No results found for this basename: CKTOTAL, CKMB, CKMBINDEX, TROPONINI,  in the last 168 hours  BNP (last 3 results)  Recent Labs  06/11/14 1814  PROBNP 10167.0*   CBG: No results found for this basename: GLUCAP,  in the last 168 hours  Radiological Exams on Admission: Dg Chest Port 1 View  06/11/2014   CLINICAL DATA:  Shortness of breath x2 days.  EXAM: PORTABLE CHEST - 1 VIEW  COMPARISON:  03/11/2010.  FINDINGS: Mediastinum hilar structures normal. Diffuse mild interstitial prominence noted suggesting mild pneumonitis. Heart size normal. Pulmonary vascularity is normal. No pleural effusion or pneumothorax. No acute osseous abnormality. Degenerative changes both shoulders. Degenerative changes thoracic spine.  IMPRESSION: Diffuse mild interstitial prominence suggesting mild pneumonitis .   Electronically Signed   By: Marcello Moores  Register   On: 06/11/2014 18:04    EKG: Independently reviewed. NSR  Assessment/Plan Active Problems:   Hypertension   Diabetes mellitus without complication   Hyperlipemia   Congestive heart failure   CHF (congestive heart failure)   1. CHF -will get an echo in am -will start on lasix -continue with oxygen -check serial enzymes  2. Hypertension -will continue on her home medications -continue to monitor her pressures  3. Hyperlipidemia -continue statins  4. Type 2 Diabetes Uncomplicated -will monitor blood glucose -continue oral agents -check A1C  5. Hypothyroid -will check TSH    Code Status: Full Code (must indicate code status--if unknown or must be presumed, indicate so) DVT Prophylaxis:Heparin Family Communication:  Daughter (indicate person spoken with, if applicable, with phone number if by telephone) Disposition Plan: Home (indicate anticipated LOS)  Time spent: 70min  KHAN,SAADAT A [facility] Pager [phone]

## 2014-06-11 NOTE — Progress Notes (Signed)
ANTICOAGULATION CONSULT NOTE - Initial Consult  Pharmacy Consult for Heparin Indication: chest pain/ACS  No Known Allergies  Patient Measurements:   ED RN reports weight as 111 kg and height as 170 cm, IBW 61.4 kg Heparin Dosing Weight: 87 kg  Vital Signs: Temp: 98.6 F (37 C) (10/28 1722) Temp Source: Oral (10/28 1722) BP: 101/53 mmHg (10/28 1941) Pulse Rate: 70 (10/28 1941)  Labs:  Recent Labs  06/11/14 1814  HGB 12.6  HCT 38.6  PLT 173  CREATININE 1.52*    The CrCl is unknown because both a height and weight (above a minimum accepted value) are required for this calculation.   Medical History: Past Medical History  Diagnosis Date  . Hypertension   . Diabetes mellitus without complication   . Arthritis     Assessment: 71 yoF presents with shortness of breath.  CXR shows mild pneumonitis, but not pneumonia.  No fever, WBC WNL.  Troponin elevated.  Pharmacy consulted to start heparin infusion for ACS.  Baseline aPTT and INR ordered. CBC: WNL Renal: SCr 1.52, CrCl~47 ml/min  Goal of Therapy:  Heparin level 0.3-0.7 units/ml Monitor platelets by anticoagulation protocol: Yes   Plan:  1.  Heparin 4000 unit IV bolus x 1 then start heparin infusion at 1200 units/hr. 2.  Check heparin level in 8 hours. 3.  Daily CBC and heparin level while on heparin.  Hershal Coria 06/11/2014,7:46 PM

## 2014-06-11 NOTE — ED Notes (Signed)
Attempted to call report. Floor nurse will call back for report.

## 2014-06-12 DIAGNOSIS — E785 Hyperlipidemia, unspecified: Secondary | ICD-10-CM | POA: Diagnosis not present

## 2014-06-12 DIAGNOSIS — N179 Acute kidney failure, unspecified: Secondary | ICD-10-CM | POA: Diagnosis not present

## 2014-06-12 DIAGNOSIS — M7989 Other specified soft tissue disorders: Secondary | ICD-10-CM | POA: Diagnosis not present

## 2014-06-12 DIAGNOSIS — R918 Other nonspecific abnormal finding of lung field: Secondary | ICD-10-CM | POA: Diagnosis not present

## 2014-06-12 DIAGNOSIS — R42 Dizziness and giddiness: Secondary | ICD-10-CM | POA: Diagnosis not present

## 2014-06-12 DIAGNOSIS — I951 Orthostatic hypotension: Secondary | ICD-10-CM | POA: Diagnosis not present

## 2014-06-12 DIAGNOSIS — R911 Solitary pulmonary nodule: Secondary | ICD-10-CM | POA: Diagnosis not present

## 2014-06-12 DIAGNOSIS — J984 Other disorders of lung: Secondary | ICD-10-CM | POA: Diagnosis not present

## 2014-06-12 DIAGNOSIS — I5023 Acute on chronic systolic (congestive) heart failure: Secondary | ICD-10-CM | POA: Diagnosis not present

## 2014-06-12 DIAGNOSIS — I519 Heart disease, unspecified: Secondary | ICD-10-CM | POA: Diagnosis not present

## 2014-06-12 DIAGNOSIS — I248 Other forms of acute ischemic heart disease: Secondary | ICD-10-CM | POA: Diagnosis present

## 2014-06-12 DIAGNOSIS — R0602 Shortness of breath: Secondary | ICD-10-CM

## 2014-06-12 DIAGNOSIS — N183 Chronic kidney disease, stage 3 (moderate): Secondary | ICD-10-CM | POA: Diagnosis present

## 2014-06-12 DIAGNOSIS — Z6833 Body mass index (BMI) 33.0-33.9, adult: Secondary | ICD-10-CM | POA: Diagnosis not present

## 2014-06-12 DIAGNOSIS — I509 Heart failure, unspecified: Secondary | ICD-10-CM

## 2014-06-12 DIAGNOSIS — I10 Essential (primary) hypertension: Secondary | ICD-10-CM | POA: Diagnosis not present

## 2014-06-12 DIAGNOSIS — I5032 Chronic diastolic (congestive) heart failure: Secondary | ICD-10-CM | POA: Diagnosis not present

## 2014-06-12 DIAGNOSIS — Z79899 Other long term (current) drug therapy: Secondary | ICD-10-CM | POA: Diagnosis not present

## 2014-06-12 DIAGNOSIS — E440 Moderate protein-calorie malnutrition: Secondary | ICD-10-CM | POA: Diagnosis not present

## 2014-06-12 DIAGNOSIS — Z880 Allergy status to penicillin: Secondary | ICD-10-CM | POA: Diagnosis not present

## 2014-06-12 DIAGNOSIS — E119 Type 2 diabetes mellitus without complications: Secondary | ICD-10-CM

## 2014-06-12 DIAGNOSIS — I5031 Acute diastolic (congestive) heart failure: Secondary | ICD-10-CM | POA: Diagnosis not present

## 2014-06-12 DIAGNOSIS — I270 Primary pulmonary hypertension: Secondary | ICD-10-CM | POA: Diagnosis not present

## 2014-06-12 DIAGNOSIS — R0600 Dyspnea, unspecified: Secondary | ICD-10-CM | POA: Diagnosis not present

## 2014-06-12 DIAGNOSIS — E669 Obesity, unspecified: Secondary | ICD-10-CM | POA: Diagnosis present

## 2014-06-12 DIAGNOSIS — E039 Hypothyroidism, unspecified: Secondary | ICD-10-CM | POA: Diagnosis present

## 2014-06-12 DIAGNOSIS — M1990 Unspecified osteoarthritis, unspecified site: Secondary | ICD-10-CM | POA: Diagnosis present

## 2014-06-12 DIAGNOSIS — I272 Other secondary pulmonary hypertension: Secondary | ICD-10-CM | POA: Diagnosis not present

## 2014-06-12 DIAGNOSIS — J9601 Acute respiratory failure with hypoxia: Secondary | ICD-10-CM | POA: Diagnosis not present

## 2014-06-12 DIAGNOSIS — I129 Hypertensive chronic kidney disease with stage 1 through stage 4 chronic kidney disease, or unspecified chronic kidney disease: Secondary | ICD-10-CM | POA: Diagnosis present

## 2014-06-12 DIAGNOSIS — R634 Abnormal weight loss: Secondary | ICD-10-CM | POA: Diagnosis present

## 2014-06-12 DIAGNOSIS — R0902 Hypoxemia: Secondary | ICD-10-CM | POA: Diagnosis present

## 2014-06-12 DIAGNOSIS — R748 Abnormal levels of other serum enzymes: Secondary | ICD-10-CM | POA: Diagnosis present

## 2014-06-12 LAB — COMPREHENSIVE METABOLIC PANEL
ALT: 6 U/L (ref 0–35)
ANION GAP: 11 (ref 5–15)
AST: 10 U/L (ref 0–37)
Albumin: 3.1 g/dL — ABNORMAL LOW (ref 3.5–5.2)
Alkaline Phosphatase: 61 U/L (ref 39–117)
BUN: 34 mg/dL — AB (ref 6–23)
CALCIUM: 9.9 mg/dL (ref 8.4–10.5)
CO2: 28 mEq/L (ref 19–32)
Chloride: 105 mEq/L (ref 96–112)
Creatinine, Ser: 1.7 mg/dL — ABNORMAL HIGH (ref 0.50–1.10)
GFR calc Af Amer: 31 mL/min — ABNORMAL LOW (ref >90)
GFR calc non Af Amer: 26 mL/min — ABNORMAL LOW (ref >90)
Glucose, Bld: 117 mg/dL — ABNORMAL HIGH (ref 70–99)
Potassium: 4.2 mEq/L (ref 3.7–5.3)
Sodium: 144 mEq/L (ref 137–147)
TOTAL PROTEIN: 5.9 g/dL — AB (ref 6.0–8.3)
Total Bilirubin: 0.4 mg/dL (ref 0.3–1.2)

## 2014-06-12 LAB — CBC
HEMATOCRIT: 35.6 % — AB (ref 36.0–46.0)
Hemoglobin: 11.4 g/dL — ABNORMAL LOW (ref 12.0–15.0)
MCH: 29 pg (ref 26.0–34.0)
MCHC: 32 g/dL (ref 30.0–36.0)
MCV: 90.6 fL (ref 78.0–100.0)
Platelets: 148 10*3/uL — ABNORMAL LOW (ref 150–400)
RBC: 3.93 MIL/uL (ref 3.87–5.11)
RDW: 13 % (ref 11.5–15.5)
WBC: 8.6 10*3/uL (ref 4.0–10.5)

## 2014-06-12 LAB — TROPONIN I
TROPONIN I: 0.61 ng/mL — AB (ref <0.30)
Troponin I: 0.59 ng/mL (ref <0.30)

## 2014-06-12 LAB — GLUCOSE, CAPILLARY
GLUCOSE-CAPILLARY: 109 mg/dL — AB (ref 70–99)
GLUCOSE-CAPILLARY: 98 mg/dL (ref 70–99)
Glucose-Capillary: 151 mg/dL — ABNORMAL HIGH (ref 70–99)
Glucose-Capillary: 133 mg/dL — ABNORMAL HIGH (ref 70–99)

## 2014-06-12 LAB — HEMOGLOBIN A1C
Hgb A1c MFr Bld: 6.9 % — ABNORMAL HIGH (ref <5.7)
Mean Plasma Glucose: 151 mg/dL — ABNORMAL HIGH (ref <117)

## 2014-06-12 LAB — TSH: TSH: 0.681 u[IU]/mL (ref 0.350–4.500)

## 2014-06-12 MED ORDER — INSULIN ASPART 100 UNIT/ML ~~LOC~~ SOLN
0.0000 [IU] | Freq: Three times a day (TID) | SUBCUTANEOUS | Status: DC
  Administered 2014-06-12: 2 [IU] via SUBCUTANEOUS
  Administered 2014-06-13 (×3): 1 [IU] via SUBCUTANEOUS
  Administered 2014-06-14: 2 [IU] via SUBCUTANEOUS
  Administered 2014-06-15 (×2): 1 [IU] via SUBCUTANEOUS
  Administered 2014-06-15: 2 [IU] via SUBCUTANEOUS
  Administered 2014-06-16 (×2): 1 [IU] via SUBCUTANEOUS
  Administered 2014-06-16 – 2014-06-17 (×2): 2 [IU] via SUBCUTANEOUS
  Administered 2014-06-17: 1 [IU] via SUBCUTANEOUS
  Administered 2014-06-17: 3 [IU] via SUBCUTANEOUS
  Administered 2014-06-18 (×2): 2 [IU] via SUBCUTANEOUS
  Administered 2014-06-18 – 2014-06-19 (×2): 1 [IU] via SUBCUTANEOUS

## 2014-06-12 MED ORDER — BISOPROLOL FUMARATE 10 MG PO TABS
10.0000 mg | ORAL_TABLET | Freq: Two times a day (BID) | ORAL | Status: DC
  Filled 2014-06-12 (×2): qty 1

## 2014-06-12 MED ORDER — ENSURE COMPLETE PO LIQD: 237.0000 mL | Freq: Two times a day (BID) | ORAL | Status: DC | PRN

## 2014-06-12 NOTE — Progress Notes (Signed)
PHARMACIST - PHYSICIAN COMMUNICATION DR:  Humphrey Rolls CONCERNING:  METFORMIN SAFE ADMINISTRATION POLICY  RECOMMENDATION: Metformin has been placed on DISCONTINUE (rejected order) STATUS and should be reordered only after any of the conditions below are ruled out.  Current safety recommendations include avoiding metformin for a minimum of 48 hours after the patient's exposure to intravenous contrast media.  DESCRIPTION:  The Pharmacy Committee has adopted a policy that restricts the use of metformin in hospitalized patients until all the contraindications to administration have been ruled out. Specific contraindications are: []  Serum creatinine ? 1.5 for males [x]  Serum creatinine ? 1.4 for females []  Shock, acute MI, sepsis, hypoxemia, dehydration []  Planned administration of intravenous iodinated contrast media []  Heart Failure patients with low EF []  Acute or chronic metabolic acidosis (including DKA)

## 2014-06-12 NOTE — Progress Notes (Signed)
INITIAL NUTRITION ASSESSMENT  DOCUMENTATION CODES Per approved criteria  -Non-severe (moderate) malnutrition in the context of chronic illness  Pt meets criteria for moderate MALNUTRITION in the context of chronic illness as evidenced by PO intake < 75% for one month, 10% body weight loss in one year .   INTERVENTION: -Recommend Ensure Complete po BID PRN, each supplement provides 350 kcal and 13 grams of protein -Encouraged intake of Carnation Instant Breakfast to utilize upon d/c, promoted intake of heart healthy snacks -RD to continue to monitor  NUTRITION DIAGNOSIS: Inadequate oral intake related to early satiety/decreased appetite as evidenced by PO intake < 75%, 25 lb wt loss in one year.   Goal: Pt to meet >/= 90% of their estimated nutrition needs    Monitor:  Total protein/energy intake, labs, weights, supplement tolerance/acceptance  Reason for Assessment: MST  80 y.o. female  Admitting Dx: <principal problem not specified>  ASSESSMENT: Melissa Shannon is a 80 y.o. female presents with shortness of breath. She states that she has been short of breath since Monday. She has had no chest pain. She has no fevers or chills. No cough or congestion noted. She has chronic edema of her legs  -Pt endorsed an unintentional wt loss of 25 lbs in past one year (10% body weight loss, significant for time frame) -Diet recall indicated pt consuming three small meals daily. Breakfast consists of one piece of toast, and will have salads with chicken strips, or frozen TV dinners (lean cuisine, "diet" dinners) -Was going to trial [employer] pta, but had not yet tried. Encouraged use of 2% milk for additional calories and to comply with heart healthy recommendations. Reviewed heart healthy protein and snacks for muscle maintenance. Was willing to consume Ensure Complete as needed as current PO intake at 25%. -Denied nausea/vomiting or abd pain; noted feelings of early satiety,  and weakness that inhibit PO intake  Height: Ht Readings from Last 1 Encounters:  06/11/14 5\' 8"  (1.727 m)    Weight: Wt Readings from Last 1 Encounters:  06/11/14 219 lb 9.6 oz (99.61 kg)    Ideal Body Weight: 140 lb  % Ideal Body Weight: 156%  Wt Readings from Last 10 Encounters:  06/11/14 219 lb 9.6 oz (99.61 kg)  07/04/13 245 lb (111.131 kg)    Usual Body Weight: 240-245 lbs  % Usual Body Weight: 90%  BMI:  Body mass index is 33.4 kg/(m^2).  Estimated Nutritional Needs: Kcal: 1600-1800 Protein: 90-100 gram Fluid: >/= 1600 ml daily  Skin: WDL  Diet Order:    EDUCATION NEEDS: -Education needs addressed   Intake/Output Summary (Last 24 hours) at 06/12/14 1602 Last data filed at 06/12/14 1356  Gross per 24 hour  Intake    720 ml  Output   1550 ml  Net   -830 ml    Last BM: PTA   Labs:   Recent Labs Lab 06/11/14 1814 06/11/14 2214 06/12/14 0421  NA 141  --  144  K 4.3  --  4.2  CL 102  --  105  CO2 23  --  28  BUN 30*  --  34*  CREATININE 1.52* 1.67* 1.70*  CALCIUM 10.1  --  9.9  GLUCOSE 136*  --  117*    CBG (last 3)   Recent Labs  06/12/14 0743 06/12/14 1144  GLUCAP 109* 151*    Scheduled Meds: . aspirin EC  325 mg Oral Daily  . bisoprolol  10 mg Oral BID  .  folic acid  1 mg Oral Daily  . furosemide  40 mg Intravenous Daily  . heparin  5,000 Units Subcutaneous 3 times per day  . insulin aspart  0-9 Units Subcutaneous TID WC  . levothyroxine  100 mcg Oral QAC breakfast  . multivitamin with minerals  1 tablet Oral Daily  . rosuvastatin  20 mg Oral q1800  . sodium chloride  3 mL Intravenous Q12H  . sodium chloride  3 mL Intravenous Q12H  . thiamine  100 mg Oral Daily    Continuous Infusions:   Past Medical History  Diagnosis Date  . Hypertension   . Diabetes mellitus without complication   . Arthritis   . Hyperlipidemia   . Urinary incontinence     Past Surgical History  Procedure Laterality Date  . Cholecystectomy     . Tonsillectomy    . Pilonidal cyst excision    . Eye surgery      bilateral cataracts  . Right elbow      x 2, still has one piece of metal in it    Atlee Abide [facility] Clinical Dietitian [id number]

## 2014-06-12 NOTE — Progress Notes (Addendum)
[facility] PROGRESS NOTE  Melissa Shannon [id number] DOB: 1928/05/23 DOA: 06/11/2014 PCP: No primary provider on file.  Assessment/Plan:  Acute CHF: unclear if diastolic or systolic, pending ECHO.  -Chest x ray with interstitial prominence suggesting mild Pneumonitis. BNP elevated at 10,167 -ECHO pending.  -will start on lasix  -Negative 1.3 L.  -cardiology consulted.  -continue with IV lasix 40 mg daily, monitor renal function.  -repeat chest x ray 10-30.   Elevated troponin: could be demand ischemia in setting of heart failure. Also patient with KCD. ECHO ordered. Cardiology consulted.   CKD stage III:  Last Cr per records 2014 at 1.6.  Monitor on lasix.  Hold ACE.   Hypertension -hold Norvasc, HCTZ, and ACE.  -Continue with bistolic.   Hyperlipidemia -continue statins   Type 2 Diabetes Uncomplicated -DC metformin due to renal failure.  -SSI.  -check A1C   5. Hypothyroid  -TSH; 0.68   Code Status: Full Code.  Family Communication: care discussed with patient.  Disposition Plan: remain inpatient.    Consultants:  Cardiology  Procedures:  ECHO pending.   Antibiotics: None  HPI/Subjective: Feeling better, breathing better.  No SOB. No chest pain. No weight gain.   Objective: Filed Vitals:   06/12/14 0448  BP: 102/55  Pulse: 60  Temp: 98.3 F (36.8 C)  Resp: 22    Intake/Output Summary (Last 24 hours) at 06/12/14 0848 Last data filed at 06/12/14 0800  Gross per 24 hour  Intake    240 ml  Output   1550 ml  Net  -1310 ml   Filed Weights   06/11/14 2147  Weight: 99.61 kg (219 lb 9.6 oz)    Exam:   General:  Alert in no distress.   Cardiovascular: S 1, S 2 RRR  Respiratory: CTA  Abdomen: Bs present, soft, NT  Musculoskeletal: trace edema.    Data Reviewed: Basic Metabolic Panel:  Recent Labs Lab 06/11/14 1814 06/11/14 2214 06/12/14 0421  NA 141  --  144  K 4.3  --  4.2  CL 102  --  105  CO2 23  --  28   GLUCOSE 136*  --  117*  BUN 30*  --  34*  CREATININE 1.52* 1.67* 1.70*  CALCIUM 10.1  --  9.9   Liver Function Tests:  Recent Labs Lab 06/12/14 0421  AST 10  ALT 6  ALKPHOS 61  BILITOT 0.4  PROT 5.9*  ALBUMIN 3.1*   No results found for this basename: LIPASE, AMYLASE,  in the last 168 hours No results found for this basename: AMMONIA,  in the last 168 hours CBC:  Recent Labs Lab 06/11/14 1814 06/11/14 2214 06/12/14 0421  WBC 8.7 9.0 8.6  HGB 12.6 11.8* 11.4*  HCT 38.6 36.3 35.6*  MCV 90.0 89.6 90.6  PLT 173 166 148*   Cardiac Enzymes:  Recent Labs Lab 06/11/14 2214 06/12/14 0421  TROPONINI 0.66* 0.61*   BNP (last 3 results)  Recent Labs  06/11/14 1814  PROBNP 10167.0*   CBG:  Recent Labs Lab 06/12/14 0743  GLUCAP 109*    No results found for this or any previous visit (from the past 240 hour(s)).   Studies: Dg Chest Port 1 View  06/11/2014   CLINICAL DATA:  Shortness of breath x2 days.  EXAM: PORTABLE CHEST - 1 VIEW  COMPARISON:  03/11/2010.  FINDINGS: Mediastinum hilar structures normal. Diffuse mild interstitial prominence noted suggesting mild pneumonitis. Heart size normal. Pulmonary vascularity is normal. No pleural  effusion or pneumothorax. No acute osseous abnormality. Degenerative changes both shoulders. Degenerative changes thoracic spine.  IMPRESSION: Diffuse mild interstitial prominence suggesting mild pneumonitis .   Electronically Signed   By: Marcello Moores  Register   On: 06/11/2014 18:04    Scheduled Meds: . amLODipine  5 mg Oral Daily   And  . benazepril  20 mg Oral Daily  . aspirin EC  325 mg Oral Daily  . bisoprolol-hydrochlorothiazide  1 tablet Oral BID  . folic acid  1 mg Oral Daily  . furosemide  40 mg Intravenous Daily  . heparin  5,000 Units Subcutaneous 3 times per day  . levothyroxine  100 mcg Oral QAC breakfast  . multivitamin with minerals  1 tablet Oral Daily  . rosuvastatin  20 mg Oral q1800  . sodium chloride  3 mL  Intravenous Q12H  . sodium chloride  3 mL Intravenous Q12H  . thiamine  100 mg Oral Daily   Continuous Infusions:   Active Problems:   Hypertension   Diabetes mellitus without complication   Hyperlipemia   Congestive heart failure   CHF (congestive heart failure)    Time spent: 35 minutes.     Niel Hummer A  [facility] Pager [phone]. If 7PM-7AM, please contact night-coverage at [email or web address], password [facility] 06/12/2014, 8:48 AM  LOS: 1 day

## 2014-06-12 NOTE — Progress Notes (Signed)
  Echocardiogram 2D Echocardiogram has been performed.  Arne Schlender 06/12/2014, 10:56 AM

## 2014-06-12 NOTE — Progress Notes (Signed)
UR completed 

## 2014-06-12 NOTE — Consult Note (Signed)
Admit date: 06/11/2014 Referring Physician  Dr. Tyrell Antonio Primary Physician No primary provider on file. Primary Cardiologist  none Reason for Consultation  elevated troponin in the setting of heart failure  HPI: 80 year old female admitted with shortness of breath since Monday denying chest pain, fevers, cough with chronic edema of her lower extremities with no prior cardiovascular illness who is currently being treated for heart failure with IV Lasix 40 mg daily whose troponin was mildly elevated at 0.6.  Since Monday, she states that she has been more short of breath with activity, class III symptoms. She is not one to complain. Her daughter is with her presently, states that she lives in a split-level home has help 3 times a week. Has a chairlift up to her bedroom. Over the past year she has lost approximate 30 pounds without trying.  Currently, after receiving IV Lasix, she has felt improvement in her breathing. She did tell me however if she were to try to get up, she would be short of breath. She has complained of weakness in her hip muscles which she thinks is from atorvastatin.  Currently her ACE inhibitor and hydrochlorothiazide are on hold. Blood pressure fairly soft.    PMH:   Past Medical History  Diagnosis Date  . Hypertension   . Diabetes mellitus without complication   . Arthritis   . Hyperlipidemia   . Urinary incontinence     PSH:   Past Surgical History  Procedure Laterality Date  . Cholecystectomy    . Tonsillectomy    . Pilonidal cyst excision    . Eye surgery      bilateral cataracts  . Right elbow      x 2, still has one piece of metal in it   Allergies:  Penicillins Prior to Admit Meds:   Prior to Admission medications   Medication Sig Start Date End Date Taking? Authorizing Provider  amLODipine-benazepril (LOTREL) 5-20 MG per capsule Take 1 capsule by mouth 2 (two) times daily.    Yes Historical Provider, MD  bisoprolol-hydrochlorothiazide (ZIAC)  10-6.25 MG per tablet Take 1 tablet by mouth 2 (two) times daily.   Yes Historical Provider, MD  Cholecalciferol (VITAMIN D3) 5000 UNITS CHEW Chew 1 tablet by mouth daily.   Yes Historical Provider, MD  levothyroxine (SYNTHROID, LEVOTHROID) 100 MCG tablet Take 100 mcg by mouth daily before breakfast.   Yes Historical Provider, MD  metFORMIN (GLUCOPHAGE) 500 MG tablet Take 500 mg by mouth 2 (two) times daily with a meal.   Yes Historical Provider, MD  rosuvastatin (CRESTOR) 20 MG tablet Take 20 mg by mouth daily.   Yes Historical Provider, MD   Fam HX:   History reviewed. No pertinent family history. Social HX:    History   Social History  . Marital Status: Widowed    Spouse Name: N/A    Number of Children: N/A  . Years of Education: N/A   Occupational History  . Not on file.   Social History Main Topics  . Smoking status: Never Smoker   . Smokeless tobacco: Never Used  . Alcohol Use: No  . Drug Use: No  . Sexual Activity: Not on file   Other Topics Concern  . Not on file   Social History Narrative  . No narrative on file     ROS:  Denies any cough, fevers, syncope, strokelike symptoms, bleeding, chest pain. All 11 ROS were addressed and are negative except what is stated in the HPI  Physical Exam: Blood pressure 102/55, pulse 60, temperature 98.3 F (36.8 C), temperature source Oral, resp. rate 22, height 5\' 8"  (1.727 m), weight 219 lb 9.6 oz (99.61 kg), SpO2 96.00%.   General: Elderly well nourished, in no acute distress Head: Eyes PERRLA, No xanthomas.   Normal cephalic and atramatic  Lungs:   Clear bilaterally to auscultation and percussion. Normal respiratory effort. No wheezes, no rales. Heart:   HRRR S1 S2 Pulses are 2+ & equal. No murmur, rubs, gallops.  No carotid bruit. No JVD.  No abdominal bruits.  Abdomen: Bowel sounds are positive, abdomen soft and non-tender without masses. No hepatosplenomegaly. Overweight Msk:  Back normal. Normal strength and tone for  age. Extremities:  No clubbing, cyanosis, chronic heavy burden of subcutaneous tissue in her lower extremities.  Difficult to determine how much edema is present. DP +1 Neuro: Alert and oriented X 3, non-focal, MAE x 4 GU: Deferred Rectal: Deferred Psych:  Good affect, responds appropriately      Labs: Lab Results  Component Value Date   WBC 8.6 06/12/2014   HGB 11.4* 06/12/2014   HCT 35.6* 06/12/2014   MCV 90.6 06/12/2014   PLT 148* 06/12/2014      Recent Labs  06/11/14 2214 06/12/14 0421  TROPONINI 0.66* 0.61*       Radiology:  Dg Chest Port 1 View  06/11/2014   CLINICAL DATA:  Shortness of breath x2 days.  EXAM: PORTABLE CHEST - 1 VIEW  COMPARISON:  03/11/2010.  FINDINGS: Mediastinum hilar structures normal. Diffuse mild interstitial prominence noted suggesting mild pneumonitis. Heart size normal. Pulmonary vascularity is normal. No pleural effusion or pneumothorax. No acute osseous abnormality. Degenerative changes both shoulders. Degenerative changes thoracic spine.  IMPRESSION: Diffuse mild interstitial prominence suggesting mild pneumonitis .   Electronically Signed   By: Marcello Moores  Register   On: 06/11/2014 18:04   Personally viewed.  EKG:  Normal sinus rhythm by report in history and physical however I do not see copy of EKG in epic. I am reordering.  ASSESSMENT/PLAN:    80 year old with new onset heart failure, mildly elevated troponin, chronic kidney disease stage III, hypertension, diabetes, hyperlipidemia, hypothyroidism.  1. Elevated troponin-in the setting of her heart failure, in the absence of chest pain and normal sinus rhythm EKG previously reviewed in history and physical, troponin elevation of 0.6 is likely secondary to demand ischemia. I would not be surprised if she does have underlying coronary artery disease given her advanced age as well as diabetes. However, no chest discomfort. I would not place on full anticoagulation such as IV heparin at this time.  Will continue to treat the underlying heart failure. Awaiting echocardiogram. If ejection fraction is markedly reduced on echocardiogram or if symptoms of shortness of breath with exertion do not resolve with medical therapy, one could consider stress test to evaluate for ischemia. At this time, continue with medical management. Beta blocker, Lasix, aspirin, statin. Holding ACE inhibitor because of chronic kidney disease and soft blood pressure.  2. Acute heart failure-getting echocardiogram to determine whether this is diastolic or systolic. BNP is 10,067. Agree with current treatment strategy. Carefully monitoring creatinine which has increased slightly from 1.5 to 1.70. Chest x-ray read as interstitial prominence suggesting mild pneumonitis.  3. Unexplained weight loss of 30 pounds over the past year-could simply be nutritional. Per primary team.  4. Diabetes-per primary team  5. Hyperlipidemia-Crestor.  Candee Furbish, MD  06/12/2014  9:29 AM

## 2014-06-12 NOTE — Progress Notes (Signed)
Care of pt assumed at this time.  Pt assessed, agree with previously charted assessment from this shift.  Will monitor.  Coolidge Breeze, RN 06/12/2014

## 2014-06-12 NOTE — Progress Notes (Signed)
Pt's BP 104/49 tonight, HR running in low 60's. NP on call notified, K. Schorr regarding beta blocker, bisoprolol, that is due tonight. NP gave verbal orders to hold beta blocker at this time. Will continue to monitor pt closely. Carnella Guadalajara I

## 2014-06-13 ENCOUNTER — Inpatient Hospital Stay (HOSPITAL_COMMUNITY): Payer: Medicare Other

## 2014-06-13 DIAGNOSIS — I5031 Acute diastolic (congestive) heart failure: Principal | ICD-10-CM

## 2014-06-13 LAB — BASIC METABOLIC PANEL
Anion gap: 13 (ref 5–15)
BUN: 41 mg/dL — ABNORMAL HIGH (ref 6–23)
CO2: 29 mEq/L (ref 19–32)
Calcium: 10 mg/dL (ref 8.4–10.5)
Chloride: 101 mEq/L (ref 96–112)
Creatinine, Ser: 1.86 mg/dL — ABNORMAL HIGH (ref 0.50–1.10)
GFR, EST AFRICAN AMERICAN: 28 mL/min — AB (ref >90)
GFR, EST NON AFRICAN AMERICAN: 24 mL/min — AB (ref >90)
Glucose, Bld: 116 mg/dL — ABNORMAL HIGH (ref 70–99)
Potassium: 3.6 mEq/L — ABNORMAL LOW (ref 3.7–5.3)
SODIUM: 143 meq/L (ref 137–147)

## 2014-06-13 LAB — GLUCOSE, CAPILLARY
Glucose-Capillary: 136 mg/dL — ABNORMAL HIGH (ref 70–99)
Glucose-Capillary: 135 mg/dL — ABNORMAL HIGH (ref 70–99)
Glucose-Capillary: 132 mg/dL — ABNORMAL HIGH (ref 70–99)
Glucose-Capillary: 156 mg/dL — ABNORMAL HIGH (ref 70–99)

## 2014-06-13 MED ORDER — TECHNETIUM TC 99M DIETHYLENETRIAME-PENTAACETIC ACID: 44.8000 | Freq: Once | INTRAVENOUS | Status: AC | PRN

## 2014-06-13 MED ORDER — BISOPROLOL FUMARATE 5 MG PO TABS
5.0000 mg | ORAL_TABLET | Freq: Two times a day (BID) | ORAL | Status: DC
  Administered 2014-06-13 – 2014-06-15 (×3): 5 mg via ORAL
  Filled 2014-06-13 (×8): qty 1

## 2014-06-13 MED ORDER — FUROSEMIDE 40 MG PO TABS
40.0000 mg | ORAL_TABLET | Freq: Every day | ORAL | Status: DC
  Administered 2014-06-14 – 2014-06-15 (×2): 40 mg via ORAL
  Filled 2014-06-13 (×4): qty 1

## 2014-06-13 MED ORDER — TECHNETIUM TO 99M ALBUMIN AGGREGATED
5.2000 | Freq: Once | INTRAVENOUS | Status: AC | PRN
  Administered 2014-06-13: 5 via INTRAVENOUS

## 2014-06-13 MED ORDER — BISACODYL 5 MG PO TBEC
5.0000 mg | DELAYED_RELEASE_TABLET | Freq: Every day | ORAL | Status: DC | PRN
  Administered 2014-06-13: 5 mg via ORAL
  Filled 2014-06-13: qty 1

## 2014-06-13 NOTE — Evaluation (Signed)
Occupational Therapy Evaluation and Discharge Summary Patient Details Name: Melissa Shannon MRN: [id number] DOB: [birth year] Today's Date: 06/13/2014    History of Present Illness PT is an 80 yo female admitted for sob.  Pt found to have pulmonary HTN an CHF.  VQ scan done on this date.   Clinical Impression   Pt admitted with the above diagnosis and from an OT standpoint appears to be at baseline. Pt has an aid at home 3 days a week for 4 hours that can supervise bathing/dressing/cooking.  Pt is able to do those tasks with S at this time.  Reviewed energy conservation information with the pt as well as recommended at [facility] in case she would fall at home and not be able to access the phone.  Pt did reluctantly agree.   Pt has had LE weakness for some time now and has learned to compensate very well.  O2 was in place during session as VQ scan results pending but nursing okayed therapy in room.    Follow Up Recommendations  No OT follow up    Equipment Recommendations  3 in 1 bedside comode    Recommendations for Other Services       Precautions / Restrictions Precautions Precautions: Fall Precaution Comments: Pt does occaisionally fall.  Not more than 1-2x per year. Restrictions Weight Bearing Restrictions: No Other Position/Activity Restrictions: Pt with hip and LE weakness for years per report and has not walked w/o assistive device outside home in years.  Furniture walks in bedroom and throughout house with occasional use of walke in home.      Mobility Bed Mobility Overal bed mobility: Modified Independent             General bed mobility comments: Pt needed no assist to get OOB and no rails but did fatigue doing so.  Transfers Overall transfer level: Needs assistance Equipment used: 1 person hand held assist Transfers: Sit to/from Stand Sit to Stand: Supervision         General transfer comment: S for safety    Balance Overall balance assessment: Needs  assistance Sitting-balance support: Feet supported Sitting balance-Leahy Scale: Normal     Standing balance support: Bilateral upper extremity supported;During functional activity Standing balance-Leahy Scale: Fair Standing balance comment: Pt can stand w/o an assistive device but cannot take challenges.  Needs walker for ambulation                            ADL Overall ADL's : At baseline                                       General ADL Comments: Pt has had LE weakness for years and has a specific way she does her adls.  She also has assist from an aid 3x a week for 4 hours.  This aid is there to assist her in shower if needed and cooks and cleans for her.       Vision                     Perception     Praxis      Pertinent Vitals/Pain Pain Assessment: No/denies pain     Hand Dominance Right   Extremity/Trunk Assessment Upper Extremity Assessment Upper Extremity Assessment: Overall WFL for tasks assessed   Lower Extremity Assessment Lower  Extremity Assessment: Defer to PT evaluation   Cervical / Trunk Assessment Cervical / Trunk Assessment: Normal   Communication Communication Communication: No difficulties   Cognition Arousal/Alertness: Awake/alert Behavior During Therapy: WFL for tasks assessed/performed Overall Cognitive Status: Within Functional Limits for tasks assessed                     General Comments       Exercises       Shoulder Instructions      Home Living Family/patient expects to be discharged to:: Private residence Living Arrangements: Alone Available Help at Discharge: Family;Available PRN/intermittently Type of Home: House Home Access: Stairs to enter Entrance Stairs-Number of Steps: 2   Home Layout: Multi-level Alternate Level Stairs-Number of Steps: 7 up and 7 down.  7 up has a lift and she does not have to use 7 down.   Bathroom Shower/Tub: Gaffer;Door   [employer] Toilet:  Standard     Home Equipment: Walker - 2 wheels          Prior Functioning/Environment Level of Independence: Independent with assistive device(s)        Comments: uses walker at times and holds onto furniture.    OT Diagnosis: Generalized weakness   OT Problem List:     OT Treatment/Interventions:      OT Goals(Current goals can be found in the care plan section) Acute Rehab OT Goals Patient Stated Goal: to get home.  OT Frequency:     Barriers to D/C:            Co-evaluation              End of Session Equipment Utilized During Treatment: Rolling walker Nurse Communication: Mobility status  Activity Tolerance: Patient tolerated treatment well Patient left: in chair;with call bell/phone within reach;with family/visitor present   Time: 1125-1200 OT Time Calculation (min): 35 min Charges:  OT General Charges $OT Visit: 1 Procedure OT Evaluation $Initial OT Evaluation Tier I: 1 Procedure OT Treatments $Self Care/Home Management : 23-37 mins G-Codes:    Glenford Peers 2014/06/29, 12:11 PM [phone]

## 2014-06-13 NOTE — Progress Notes (Signed)
[facility] PROGRESS NOTE  Melissa Shannon [id number] DOB: Jul 12, 1928 DOA: 06/11/2014 PCP: No primary provider on file.  Assessment/Plan:  Acute Diastolic CHF:  -Chest x ray with interstitial prominence suggesting mild Pneumonitis. BNP elevated at 10,167 -ECHO pulmonary HTN, diastolic dysfunction grade 1.   -Negative 1.3 L.  -cardiology consulted.  -plan to change lasix to oral on 10-30  Intermediate probability V-Q scan. Order by cardio due to pulmonary HTN. Radiology recommend CT chest to evaluate for mass. Will check doppler.   Elevated troponin: could be demand ischemia in setting of heart failure. Also patient with KCD.   CKD stage III:  Last Cr per records 2014 at 1.6.  Monitor on lasix.  Hold ACE.  Cr today at 1.8  Hypertension -hold Norvasc, HCTZ, and ACE.  -Continue with bistolic.   Hyperlipidemia -continue statins   Type 2 Diabetes Uncomplicated -DC metformin due to renal failure.  -SSI.  -check A1C   5. Hypothyroid  -TSH; 0.68   Code Status: Full Code.  Family Communication: care discussed with patient.  Disposition Plan: remain inpatient.    Consultants:  Cardiology  Procedures:  ECHO pulmonary HTN, diastolic dysfunction.   Antibiotics: None  HPI/Subjective: Feeling better, breathing better. Eating lunch.   Objective: Filed Vitals:   06/13/14 1430  BP: 93/47  Pulse: 62  Temp: 97.7 F (36.5 C)  Resp: 18    Intake/Output Summary (Last 24 hours) at 06/13/14 1727 Last data filed at 06/13/14 0811  Gross per 24 hour  Intake    360 ml  Output    650 ml  Net   -290 ml   Filed Weights   06/11/14 2147 06/13/14 0454  Weight: 99.61 kg (219 lb 9.6 oz) 98.567 kg (217 lb 4.8 oz)    Exam:   General:  Alert in no distress.   Cardiovascular: S 1, S 2 RRR  Respiratory: CTA  Abdomen: Bs present, soft, NT  Musculoskeletal: trace edema.    Data Reviewed: Basic Metabolic Panel:  Recent Labs Lab 06/11/14 1814  06/11/14 2214 06/12/14 0421 06/13/14 0338  NA 141  --  144 143  K 4.3  --  4.2 3.6*  CL 102  --  105 101  CO2 23  --  28 29  GLUCOSE 136*  --  117* 116*  BUN 30*  --  34* 41*  CREATININE 1.52* 1.67* 1.70* 1.86*  CALCIUM 10.1  --  9.9 10.0   Liver Function Tests:  Recent Labs Lab 06/12/14 0421  AST 10  ALT 6  ALKPHOS 61  BILITOT 0.4  PROT 5.9*  ALBUMIN 3.1*   No results found for this basename: LIPASE, AMYLASE,  in the last 168 hours No results found for this basename: AMMONIA,  in the last 168 hours CBC:  Recent Labs Lab 06/11/14 1814 06/11/14 2214 06/12/14 0421  WBC 8.7 9.0 8.6  HGB 12.6 11.8* 11.4*  HCT 38.6 36.3 35.6*  MCV 90.0 89.6 90.6  PLT 173 166 148*   Cardiac Enzymes:  Recent Labs Lab 06/11/14 2214 06/12/14 0421 06/12/14 1010  TROPONINI 0.66* 0.61* 0.59*   BNP (last 3 results)  Recent Labs  06/11/14 1814  PROBNP 10167.0*   CBG:  Recent Labs Lab 06/12/14 1640 06/12/14 2104 06/13/14 0722 06/13/14 1153 06/13/14 1640  GLUCAP 98 133* 136* 135* 132*    No results found for this or any previous visit (from the past 240 hour(s)).   Studies: Dg Chest 2 View  06/13/2014   CLINICAL DATA:  Shortness of Breath, hypertension, diabetes  EXAM: CHEST  2 VIEW  COMPARISON:  06/11/2014  FINDINGS: Cardiomediastinal silhouette is stable. No acute infiltrate or pleural effusion. No pulmonary edema. Osteopenia and mild degenerative changes thoracic spine.  IMPRESSION: No active cardiopulmonary disease.   Electronically Signed   By: Lahoma Crocker M.D.   On: 06/13/2014 08:53   Ct Chest Wo Contrast  06/13/2014   CLINICAL DATA:  Shortness of breath.  EXAM: CT CHEST WITHOUT CONTRAST  TECHNIQUE: Multidetector CT imaging of the chest was performed following the standard protocol without IV contrast.  COMPARISON:  Chest radiograph and V/Q scan of same day.  FINDINGS: No pneumothorax or pleural effusion is noted. 7 x 3 mm nodule is seen anteriorly in the left lower  lobe best seen on image number 41 of series 5. Fusiform density is seen in the right middle lobe medially that measures 35 x 11 x 10 mm. No mediastinal mass or adenopathy is noted. Atherosclerotic calcifications of thoracic aorta are noted. No significant abnormality seen in the visualized portion of the upper abdomen  IMPRESSION: 35 x 11 x 10 mm fusiform density is seen in the right middle lobe medially. The possibility of neoplasm or malignancy must be considered, and further evaluation with PET scan is recommended.  Also noted is 7 x 3 mm nodule anteriorly in the left lower lobe. If the patient is at high risk for bronchogenic carcinoma, follow-up chest CT at 3-30months is recommended. If the patient is at low risk for bronchogenic carcinoma, follow-up chest CT at 6-12 months is recommended. This recommendation follows the consensus statement: Guidelines for Management of Small Pulmonary Nodules Detected on CT Scans: A Statement from the [employer] as published in Radiology 2005; 237:395-400.   Electronically Signed   By: Sabino Dick M.D.   On: 06/13/2014 16:33   Nm Pulmonary Perf And Vent  06/13/2014   CLINICAL DATA:  Dyspnea for 5 days.  EXAM: NUCLEAR MEDICINE VENTILATION - PERFUSION LUNG SCAN  TECHNIQUE: Ventilation images were obtained in multiple projections using inhaled aerosol technetium 99 M DTPA. Perfusion images were obtained in multiple projections after intravenous injection of Tc-80m MAA.  RADIOPHARMACEUTICALS:  44.8 mCi Tc-77m DTPA aerosol and 5 point to mCi Tc-17m MAA  COMPARISON:  Chest radiographs 06/13/2014  FINDINGS: Ventilation: Diffusely heterogeneous tracer, particularly peripherally, suggestive of underlying COPD, with some central airway tracer deposition.  Perfusion: There is a moderate sized matched defect in the superior segment left lower lobe. There is also a moderate sized matched defect in the anterior segment right upper lobe. Perfusion is diffusely diminished  throughout the right lung compared to the left.  IMPRESSION: Intermediate probability for pulmonary embolus. No mismatched defects are identified, however there is asymmetrically diminished perfusion throughout the right lung. Potential causes of this include hilar obstruction due to mass or lymphadenopathy, pulmonary artery stenosis, as well as proximal pulmonary embolus. Due to the patient's diminished renal function, a noncontrast chest CT is recommended to assess for a central obstructing lesion.   Electronically Signed   By: Logan Bores   On: 06/13/2014 12:26   Dg Chest Port 1 View  06/11/2014   CLINICAL DATA:  Shortness of breath x2 days.  EXAM: PORTABLE CHEST - 1 VIEW  COMPARISON:  03/11/2010.  FINDINGS: Mediastinum hilar structures normal. Diffuse mild interstitial prominence noted suggesting mild pneumonitis. Heart size normal. Pulmonary vascularity is normal. No pleural effusion or pneumothorax. No acute osseous abnormality. Degenerative changes both shoulders. Degenerative changes thoracic spine.  IMPRESSION: Diffuse mild interstitial prominence suggesting mild pneumonitis .   Electronically Signed   By: [place]   On: 06/11/2014 18:04    Scheduled Meds: . aspirin EC  325 mg Oral Daily  . bisoprolol  5 mg Oral BID  . folic acid  1 mg Oral Daily  . furosemide  40 mg Intravenous Daily  . heparin  5,000 Units Subcutaneous 3 times per day  . insulin aspart  0-9 Units Subcutaneous TID WC  . levothyroxine  100 mcg Oral QAC breakfast  . multivitamin with minerals  1 tablet Oral Daily  . rosuvastatin  20 mg Oral q1800  . sodium chloride  3 mL Intravenous Q12H  . sodium chloride  3 mL Intravenous Q12H  . thiamine  100 mg Oral Daily   Continuous Infusions:   Principal Problem:   CHF (congestive heart failure) Active Problems:   Hypertension   Diabetes mellitus without complication   Hyperlipemia   Congestive heart failure   Malnutrition of moderate degree    Time spent: 35  minutes.     Niel Hummer A  [facility] Pager [phone]. If 7PM-7AM, please contact night-coverage at [email or web address], password [facility] 06/13/2014, 5:27 PM  LOS: 2 days

## 2014-06-13 NOTE — Evaluation (Signed)
Physical Therapy Evaluation Patient Details Name: Melissa Shannon MRN: [id number] DOB: 03/30/1928 Today's Date: 06/13/2014   History of Present Illness  PT is an 80 yo female admitted for SOB.  Pt found to have pulmonary HTN an CHF.  VQ scan: Intermediate probability for pulmonary embolus. No mismatched defects are identified, however there is asymmetrically diminished perfusion throughout the right lung. Potential causes of this include hilar obstruction due to mass or lymphadenopathy, pulmonary artery stenosis, as well as proximal pulmonary embolus. Due to the patient's diminished renal function, a noncontrast chest CT is recommended to assess for a central obstructing lesion."   Clinical Impression  Pt currently with functional limitations due to the deficits listed below (see PT Problem List).  Pt will benefit from skilled PT to increase their independence and safety with mobility to allow discharge to the venue listed below.  Pt limited today due to reported dizziness which pt reported upon PT entering room and did not get better so pt requested back to bed.  Pt reports increased DOE lately, typically household ambulation distances.  Pt reports not being on oxygen at home.  Pt also states she has had increased LE weakness esp in hips over the years believed to be related to her statin per pt and daughter.  Pt to f/u with cardiologist so suggested pt discuss statin and weakness side effect during visit.  Pt and daughter agreeable to HHPT to improve endurance and strength.  Daughter reports she and her siblings are going to rotate nights for a while upon pt d/c home and try to increase home health care hours/days.      Follow Up Recommendations Home health PT;Supervision for mobility/OOB    Equipment Recommendations  None recommended by PT    Recommendations for Other Services       Precautions / Restrictions Precautions Precautions: Fall Precaution Comments: Pt does occaisionally fall.  Not  more than 1-2x per year. Restrictions Weight Bearing Restrictions: No Other Position/Activity Restrictions: Pt with hip and LE weakness for years per report and has not walked w/o assistive device outside home in years.  Furniture walks in bedroom and throughout house with occasional use of walke in home.      Mobility  Bed Mobility Overal bed mobility: Modified Independent             General bed mobility comments: increased time and UE assist for LEs  Transfers Overall transfer level: Needs assistance Equipment used: Rolling walker (2 wheeled) Transfers: Sit to/from Stand Sit to Stand: Min guard         General transfer comment: min/guard due to pt feeling dizzy, pt up in recliner on arrival so assisted back to bed per pt request, pt did not feel well enough to ambulate due to dizziness however RN gave insulin and pt feeling better upon laying down, pt on pulse ox during session and SpO2 never dropped below 93% on 3L O2 [place] and HR WNL  Ambulation/Gait                Stairs            Wheelchair Mobility    Modified Rankin (Stroke Patients Only)       Balance Overall balance assessment: Needs assistance Sitting-balance support: Feet supported Sitting balance-Leahy Scale: Normal     Standing balance support: Bilateral upper extremity supported;During functional activity Standing balance-Leahy Scale: Fair Standing balance comment: Pt can stand w/o an assistive device but cannot take challenges.  Needs walker  for ambulation                             Pertinent Vitals/Pain Pain Assessment: No/denies pain    Home Living Family/patient expects to be discharged to:: Private residence Living Arrangements: Alone Available Help at Discharge: Family;Available PRN/intermittently Type of Home: House Home Access: Stairs to enter   Entrance Stairs-Number of Steps: 2 Home Layout: Multi-level Home Equipment: Walker - 2 wheels      Prior  Function Level of Independence: Independent with assistive device(s)         Comments: Pt with hip and LE weakness for years per report and has not walked w/o assistive device outside home in years.  Furniture walks in bedroom and throughout house with occasional use of walke in home.     Hand Dominance   Dominant Hand: Right    Extremity/Trunk Assessment   Upper Extremity Assessment: Overall WFL for tasks assessed           Lower Extremity Assessment: Generalized weakness;RLE deficits/detail;LLE deficits/detail RLE Deficits / Details: decreased functional hip strength requiring assist from UEs for placement (crossing legs, lifting onto bed) LLE Deficits / Details: decreased functional hip strength requiring assist from UEs for placement (crossing legs, lifting onto bed)  Cervical / Trunk Assessment: Normal  Communication   Communication: No difficulties  Cognition Arousal/Alertness: Awake/alert Behavior During Therapy: WFL for tasks assessed/performed Overall Cognitive Status: Within Functional Limits for tasks assessed                      General Comments      Exercises        Assessment/Plan    PT Assessment Patient needs continued PT services  PT Diagnosis Difficulty walking   PT Problem List Decreased strength;Decreased activity tolerance;Decreased mobility  PT Treatment Interventions DME instruction;Gait training;Functional mobility training;Therapeutic activities;Therapeutic exercise;Patient/family education   PT Goals (Current goals can be found in the Care Plan section) Acute Rehab PT Goals Patient Stated Goal: to get home. PT Goal Formulation: With patient/family Time For Goal Achievement: 06/20/14 Potential to Achieve Goals: Good    Frequency Min 3X/week   Barriers to discharge        Co-evaluation               End of Session Equipment Utilized During Treatment: Oxygen Activity Tolerance: Other (comment) (limited by  dizziness) Patient left: in bed;with call bell/phone within reach;with family/visitor present Nurse Communication: Mobility status (aware pt back in bed and dizziness)         Time: [id number] PT Time Calculation (min): 25 min   Charges:   PT Evaluation $Initial PT Evaluation Tier I: 1 Procedure PT Treatments $Therapeutic Activity: 8-22 mins   PT G Codes:          Koleman Marling,KATHrine E 06/13/2014, 1:34 PM  Carmelia Bake, PT, DPT 06/13/2014 Pager: [phone]

## 2014-06-13 NOTE — Progress Notes (Signed)
    Subjective:  Dyspnea improving; no chest pain   Objective:  Filed Vitals:   06/12/14 0448 06/12/14 1355 06/12/14 2109 06/13/14 0454  BP: 102/55 106/58 104/49 103/45  Pulse: 60 71 65 57  Temp: 98.3 F (36.8 C) 98.1 F (36.7 C) 98.2 F (36.8 C) 98.2 F (36.8 C)  TempSrc: Oral Oral Oral Oral  Resp: 22 18 22 20   Height:      Weight:    217 lb 4.8 oz (98.567 kg)  SpO2: 96% 96% 96% 99%    Intake/Output from previous day:  Intake/Output Summary (Last 24 hours) at 06/13/14 0701 Last data filed at 06/13/14 0502  Gross per 24 hour  Intake    720 ml  Output    950 ml  Net   -230 ml    Physical Exam: Physical exam: Well-developed obese in no acute distress.  Skin is warm and dry.  HEENT is normal.  Neck is supple.  Chest is clear to auscultation with normal expansion.  Cardiovascular exam is regular rate and rhythm.  Abdominal exam nontender or distended. No masses palpated. Extremities show no edema. neuro grossly intact    Lab Results: Basic Metabolic Panel:  Recent Labs  06/12/14 0421 06/13/14 0338  NA 144 143  K 4.2 3.6*  CL 105 101  CO2 28 29  GLUCOSE 117* 116*  BUN 34* 41*  CREATININE 1.70* 1.86*  CALCIUM 9.9 10.0   CBC:  Recent Labs  06/11/14 2214 06/12/14 0421  WBC 9.0 8.6  HGB 11.8* 11.4*  HCT 36.3 35.6*  MCV 89.6 90.6  PLT 166 148*   Cardiac Enzymes:  Recent Labs  06/11/14 2214 06/12/14 0421 06/12/14 1010  TROPONINI 0.66* 0.61* 0.59*     Assessment/Plan:  1 acute diastolic congestive heart failure-echocardiogram has been reviewed. LV function is normal with grade 1 diastolic dysfunction. There is evidence of right side enlargement/reduced RV function suggestive of pulmonary hypertension. Her symptoms are improving. Will continue Lasix IV today and transition to by mouth tomorrow. Follow renal function closely. 2 pulmonary hypertension-noted on echocardiogram. Etiology unclear. There may be a component of obesity hypoventilation  syndrome or sleep apnea. I will schedule a VQ scan to screen for thrombo-embolic disease. She will need pulmonary function tests. If above negative would arrange outpatient sleep study and if sleep apnea is documented she would need CPAP. There may also be a component of pulmonary venous hypertension. 3 elevated troponin-the patient has not had chest pain and there is no clear trend up with her troponins. Plan outpatient nuclear study and then follow-up with Dr. Marlou Porch. 4 acute on chronic renal failure-patient's BUN and creatinine are mildly increased. Will continue Lasix 40 mg IV daily today and transition to oral regimen tomorrow. Follow renal function closely. 5 hypertension-blood pressure borderline. Decrease bisoprolol to 5 mg twice a day. 6 hyperlipidemia-continue statin. 7 weight loss-further evaluation per primary care.  Kirk Ruths 06/13/2014, 7:01 AM

## 2014-06-14 DIAGNOSIS — I272 Other secondary pulmonary hypertension: Secondary | ICD-10-CM

## 2014-06-14 DIAGNOSIS — I2729 Other secondary pulmonary hypertension: Secondary | ICD-10-CM

## 2014-06-14 DIAGNOSIS — I50810 Right heart failure, unspecified: Secondary | ICD-10-CM

## 2014-06-14 DIAGNOSIS — J9601 Acute respiratory failure with hypoxia: Secondary | ICD-10-CM

## 2014-06-14 DIAGNOSIS — I270 Primary pulmonary hypertension: Secondary | ICD-10-CM

## 2014-06-14 DIAGNOSIS — M7989 Other specified soft tissue disorders: Secondary | ICD-10-CM

## 2014-06-14 DIAGNOSIS — R918 Other nonspecific abnormal finding of lung field: Secondary | ICD-10-CM

## 2014-06-14 LAB — BASIC METABOLIC PANEL
ANION GAP: 12 (ref 5–15)
BUN: 46 mg/dL — ABNORMAL HIGH (ref 6–23)
CHLORIDE: 100 meq/L (ref 96–112)
CO2: 28 meq/L (ref 19–32)
CREATININE: 1.8 mg/dL — AB (ref 0.50–1.10)
Calcium: 9.6 mg/dL (ref 8.4–10.5)
GFR calc Af Amer: 29 mL/min — ABNORMAL LOW (ref >90)
GFR calc non Af Amer: 25 mL/min — ABNORMAL LOW (ref >90)
Glucose, Bld: 119 mg/dL — ABNORMAL HIGH (ref 70–99)
Potassium: 3.3 mEq/L — ABNORMAL LOW (ref 3.7–5.3)
Sodium: 140 mEq/L (ref 137–147)

## 2014-06-14 LAB — CBC
HCT: 33.4 % — ABNORMAL LOW (ref 36.0–46.0)
HEMOGLOBIN: 10.7 g/dL — AB (ref 12.0–15.0)
MCH: 28.8 pg (ref 26.0–34.0)
MCHC: 32 g/dL (ref 30.0–36.0)
MCV: 90 fL (ref 78.0–100.0)
Platelets: 154 10*3/uL (ref 150–400)
RBC: 3.71 MIL/uL — ABNORMAL LOW (ref 3.87–5.11)
RDW: 13 % (ref 11.5–15.5)
WBC: 9.1 10*3/uL (ref 4.0–10.5)

## 2014-06-14 LAB — GLUCOSE, CAPILLARY
GLUCOSE-CAPILLARY: 120 mg/dL — AB (ref 70–99)
GLUCOSE-CAPILLARY: 118 mg/dL — AB (ref 70–99)
Glucose-Capillary: 156 mg/dL — ABNORMAL HIGH (ref 70–99)
Glucose-Capillary: 112 mg/dL — ABNORMAL HIGH (ref 70–99)

## 2014-06-14 MED ORDER — ASPIRIN EC 81 MG PO TBEC
81.0000 mg | DELAYED_RELEASE_TABLET | Freq: Every day | ORAL | Status: DC
  Administered 2014-06-15 – 2014-06-19 (×5): 81 mg via ORAL
  Filled 2014-06-14 (×5): qty 1

## 2014-06-14 MED ORDER — ENOXAPARIN SODIUM 100 MG/ML ~~LOC~~ SOLN
100.0000 mg | SUBCUTANEOUS | Status: DC
  Administered 2014-06-14 – 2014-06-16 (×3): 100 mg via SUBCUTANEOUS
  Filled 2014-06-14 (×4): qty 1

## 2014-06-14 MED ORDER — POTASSIUM CHLORIDE CRYS ER 20 MEQ PO TBCR
20.0000 meq | EXTENDED_RELEASE_TABLET | Freq: Once | ORAL | Status: AC
  Administered 2014-06-14: 20 meq via ORAL
  Filled 2014-06-14: qty 1

## 2014-06-14 NOTE — Progress Notes (Signed)
[facility] PROGRESS NOTE  Melissa Shannon [id number] DOB: 05-31-1928 DOA: 06/11/2014 PCP: No primary provider on file.  Assessment/Plan:  Acute Diastolic CHF:  -Chest x ray with interstitial prominence suggesting mild Pneumonitis. BNP elevated at 10,167 -ECHO pulmonary HTN, diastolic dysfunction grade 1.   -Negative 1.3 L.  -cardiology consulted.  -plan to change lasix to oral on 10-30  Pulmonary HTN, probably secondary to PE.  -Intermediate probability V-Q scan.  -Radiology recommend CT chest to evaluate for mass.  -CT Chest : 35 x 11 x 10 mm fusiform density is seen in the right middle lobe medially. Also noted is 7 x 3 mm nodule anteriorly in the left lower lobe. If the patient is at high risk for bronchogenic carcinoma, -Doppler preliminary report small deep vein thrombosis of the proximal popliteal vein , age undetermined.  -Will start patient on Lovenox.  -need ECHO in 3 month.   Multiple Pulmonary nodules: Lung nodule, and fusiform density 35 X 11 mm: Pulmonary consulted.  Appreciate pulmonary recommendation. Need CT contrast scan in 3 month.   Elevated troponin: could be demand ischemia in setting of heart failure. Also patient with KCD.   CKD stage III:  Last Cr per records 2014 at 1.6.  Monitor on lasix.  Hold ACE.  Cr today at 1.8  Hypertension -hold Norvasc, HCTZ, and ACE.  -Continue with bistolic.   Hyperlipidemia -continue statins   Type 2 Diabetes Uncomplicated -DC metformin due to renal failure.  -SSI.  - A1C 6.9  5. Hypothyroid  -TSH; 0.68   Code Status: Full Code.  Family Communication: care discussed with patient.  Disposition Plan: remain inpatient.    Consultants:  Cardiology  Pulmonary   Procedures:  ECHO pulmonary HTN, diastolic dysfunction.   Doppler: Positive for a small deep vein thrombosis of the proximal popliteal vein , age undetermined. No evidence of a superficial thrombosis or Baker's cyst. Left: No evidence of  DVT, superficial thrombosis, or Baker's cyst.   Antibiotics: None  HPI/Subjective: Feeling better, breathing better.   Objective: Filed Vitals:   06/14/14 0542  BP: 101/53  Pulse: 62  Temp: 97.9 F (36.6 C)  Resp: 18    Intake/Output Summary (Last 24 hours) at 06/14/14 1059 Last data filed at 06/14/14 1000  Gross per 24 hour  Intake    483 ml  Output    750 ml  Net   -267 ml   Filed Weights   06/11/14 2147 06/13/14 0454 06/14/14 0542  Weight: 99.61 kg (219 lb 9.6 oz) 98.567 kg (217 lb 4.8 oz) 98.9 kg (218 lb 0.6 oz)    Exam:   General:  Alert in no distress.   Cardiovascular: S 1, S 2 RRR  Respiratory: CTA  Abdomen: Bs present, soft, NT  Musculoskeletal: trace edema.    Data Reviewed: Basic Metabolic Panel:  Recent Labs Lab 06/11/14 1814 06/11/14 2214 06/12/14 0421 06/13/14 0338 06/14/14 0405  NA 141  --  144 143 140  K 4.3  --  4.2 3.6* 3.3*  CL 102  --  105 101 100  CO2 23  --  28 29 28   GLUCOSE 136*  --  117* 116* 119*  BUN 30*  --  34* 41* 46*  CREATININE 1.52* 1.67* 1.70* 1.86* 1.80*  CALCIUM 10.1  --  9.9 10.0 9.6   Liver Function Tests:  Recent Labs Lab 06/12/14 0421  AST 10  ALT 6  ALKPHOS 61  BILITOT 0.4  PROT 5.9*  ALBUMIN 3.1*  No results found for this basename: LIPASE, AMYLASE,  in the last 168 hours No results found for this basename: AMMONIA,  in the last 168 hours CBC:  Recent Labs Lab 06/11/14 1814 06/11/14 2214 06/12/14 0421 06/14/14 0405  WBC 8.7 9.0 8.6 9.1  HGB 12.6 11.8* 11.4* 10.7*  HCT 38.6 36.3 35.6* 33.4*  MCV 90.0 89.6 90.6 90.0  PLT 173 166 148* 154   Cardiac Enzymes:  Recent Labs Lab 06/11/14 2214 06/12/14 0421 06/12/14 1010  TROPONINI 0.66* 0.61* 0.59*   BNP (last 3 results)  Recent Labs  06/11/14 1814  PROBNP 10167.0*   CBG:  Recent Labs Lab 06/13/14 0722 06/13/14 1153 06/13/14 1640 06/13/14 2127 06/14/14 0756  GLUCAP 136* 135* 132* 156* 120*    No results found for  this or any previous visit (from the past 240 hour(s)).   Studies: Dg Chest 2 View  06/13/2014   CLINICAL DATA:  Shortness of Breath, hypertension, diabetes  EXAM: CHEST  2 VIEW  COMPARISON:  06/11/2014  FINDINGS: Cardiomediastinal silhouette is stable. No acute infiltrate or pleural effusion. No pulmonary edema. Osteopenia and mild degenerative changes thoracic spine.  IMPRESSION: No active cardiopulmonary disease.   Electronically Signed   By: Lahoma Crocker M.D.   On: 06/13/2014 08:53   Ct Chest Wo Contrast  06/13/2014   CLINICAL DATA:  Shortness of breath.  EXAM: CT CHEST WITHOUT CONTRAST  TECHNIQUE: Multidetector CT imaging of the chest was performed following the standard protocol without IV contrast.  COMPARISON:  Chest radiograph and V/Q scan of same day.  FINDINGS: No pneumothorax or pleural effusion is noted. 7 x 3 mm nodule is seen anteriorly in the left lower lobe best seen on image number 41 of series 5. Fusiform density is seen in the right middle lobe medially that measures 35 x 11 x 10 mm. No mediastinal mass or adenopathy is noted. Atherosclerotic calcifications of thoracic aorta are noted. No significant abnormality seen in the visualized portion of the upper abdomen  IMPRESSION: 35 x 11 x 10 mm fusiform density is seen in the right middle lobe medially. The possibility of neoplasm or malignancy must be considered, and further evaluation with PET scan is recommended.  Also noted is 7 x 3 mm nodule anteriorly in the left lower lobe. If the patient is at high risk for bronchogenic carcinoma, follow-up chest CT at 3-76months is recommended. If the patient is at low risk for bronchogenic carcinoma, follow-up chest CT at 6-12 months is recommended. This recommendation follows the consensus statement: Guidelines for Management of Small Pulmonary Nodules Detected on CT Scans: A Statement from the [place] as published in Radiology 2005; 237:395-400.   Electronically Signed   By: Sabino Dick  M.D.   On: 06/13/2014 16:33   Nm Pulmonary Perf And Vent  06/13/2014   CLINICAL DATA:  Dyspnea for 5 days.  EXAM: NUCLEAR MEDICINE VENTILATION - PERFUSION LUNG SCAN  TECHNIQUE: Ventilation images were obtained in multiple projections using inhaled aerosol technetium 99 M DTPA. Perfusion images were obtained in multiple projections after intravenous injection of Tc-90m MAA.  RADIOPHARMACEUTICALS:  44.8 mCi Tc-49m DTPA aerosol and 5 point to mCi Tc-60m MAA  COMPARISON:  Chest radiographs 06/13/2014  FINDINGS: Ventilation: Diffusely heterogeneous tracer, particularly peripherally, suggestive of underlying COPD, with some central airway tracer deposition.  Perfusion: There is a moderate sized matched defect in the superior segment left lower lobe. There is also a moderate sized matched defect in the anterior segment right upper  lobe. Perfusion is diffusely diminished throughout the right lung compared to the left.  IMPRESSION: Intermediate probability for pulmonary embolus. No mismatched defects are identified, however there is asymmetrically diminished perfusion throughout the right lung. Potential causes of this include hilar obstruction due to mass or lymphadenopathy, pulmonary artery stenosis, as well as proximal pulmonary embolus. Due to the patient's diminished renal function, a noncontrast chest CT is recommended to assess for a central obstructing lesion.   Electronically Signed   By: Logan Bores   On: 06/13/2014 12:26    Scheduled Meds: . aspirin EC  325 mg Oral Daily  . bisoprolol  5 mg Oral BID  . folic acid  1 mg Oral Daily  . furosemide  40 mg Oral Daily  . heparin  5,000 Units Subcutaneous 3 times per day  . insulin aspart  0-9 Units Subcutaneous TID WC  . levothyroxine  100 mcg Oral QAC breakfast  . multivitamin with minerals  1 tablet Oral Daily  . potassium chloride  20 mEq Oral Once  . rosuvastatin  20 mg Oral q1800  . sodium chloride  3 mL Intravenous Q12H  . sodium chloride  3 mL  Intravenous Q12H  . thiamine  100 mg Oral Daily   Continuous Infusions:   Principal Problem:   CHF (congestive heart failure) Active Problems:   Hypertension   Diabetes mellitus without complication   Hyperlipemia   Congestive heart failure   Malnutrition of moderate degree   Right heart failure due to pulmonary hypertension   Pulmonary hypertension    Time spent: 35 minutes.     Niel Hummer A  [facility] Pager [phone]. If 7PM-7AM, please contact night-coverage at [email or web address], password [facility] 06/14/2014, 10:59 AM  LOS: 3 days

## 2014-06-14 NOTE — Consult Note (Signed)
PULMONARY / CRITICAL CARE MEDICINE   Name: Melissa Shannon MRN: [id number] DOB: [birth year]    ADMISSION DATE:  06/11/2014 CONSULTATION DATE:  06/14/14   REFERRING MD :  [facility]  CHIEF COMPLAINT: sob  INITIAL PRESENTATION:  65 yowf never smoker very inactive  typically limited by djd with new onset sob with minimal activity 06/08/14 with cards  w/u c/w PH so v/q done showing intermediate with abnormal CT s contrast and preliminary evidence dvt so rx with lovenox and PCCM eval requested am 10/31   STUDIES:  Echo 06/12/14 Normal LV size with compression from enlarged RV. LV EF 60-65%. D-shaped interventricular septum suggesting RV pressure/volume overload. The RV was severely dilated with moderate systolic dysfunciton. There was mild pulmonary hypertension. 06/13/13:  intrmediate pob VQ 06/13/14 : CT s contrast  MPNs largest oblong density  35 x 11 x 10 mm in RML 06/13/14 Venous dopplers>>>   SIGNIFICANT EVENTS:  therapeutic lovenox rx 10/31 >>>  HISTORY OF PRESENT ILLNESS:   86 yowf presented with shortness of breath. She states that she has been short of breath since 10/25 She has had no chest pain. She has no fevers or chills. No cough or congestion noted. She has chronic edema of her legs. She has no headache dizziness. No prior history of heart failure. With she no cardiac issues in the past. She has no stroke in the past. She states that since she arroved om ED her symptoms  resolved (at rest on 02) She is a diabetic and has hypertension.  No obvious day to day or daytime variabilty or assoc chronic cough or cp or chest tightness, subjective wheeze overt sinus or hb symptoms. No unusual exp hx or h/o childhood pna/ asthma or knowledge of premature birth.  Sleeping ok without nocturnal  or early am exacerbation  of respiratory  c/o's or need for noct saba. Also denies any obvious fluctuation of symptoms with weather or environmental changes or other aggravating or alleviating factors  except as outlined above   Current Medications, Allergies, Complete Past Medical History, Past Surgical History, Family History, and Social History were reviewed in [employer] record.  ROS  The following are not active complaints unless bolded sore throat, dysphagia, dental problems, itching, sneezing,  nasal congestion or excess/ purulent secretions, ear ache,   fever, chills, sweats, unintended wt loss x 30 lbs, pleuritic or exertional cp, hemoptysis,  orthopnea pnd or leg swelling no change x 4 y, presyncope, palpitations, heartburn, abdominal pain, anorexia, nausea, vomiting, diarrhea  or change in bowel or urinary habits, change in stools or urine, dysuria,hematuria,  rash, arthralgias, visual complaints, headache, numbness weakness or ataxia or problems with walking or coordination,  change in mood/affect or memory.        PAST MEDICAL HISTORY :   has a past medical history of Hypertension; Diabetes mellitus without complication; Arthritis; Hyperlipidemia; and Urinary incontinence.  has past surgical history that includes Cholecystectomy; Tonsillectomy; Pilonidal cyst excision; Eye surgery; and right elbow. Prior to Admission medications   Medication Sig Start Date End Date Taking? Authorizing Provider  amLODipine-benazepril (LOTREL) 5-20 MG per capsule Take 1 capsule by mouth 2 (two) times daily.    Yes Historical Provider, MD  bisoprolol-hydrochlorothiazide (ZIAC) 10-6.25 MG per tablet Take 1 tablet by mouth 2 (two) times daily.   Yes Historical Provider, MD  Cholecalciferol (VITAMIN D3) 5000 UNITS CHEW Chew 1 tablet by mouth daily.   Yes Historical Provider, MD  levothyroxine (SYNTHROID, LEVOTHROID) 100 MCG  tablet Take 100 mcg by mouth daily before breakfast.   Yes Historical Provider, MD  metFORMIN (GLUCOPHAGE) 500 MG tablet Take 500 mg by mouth 2 (two) times daily with a meal.   Yes Historical Provider, MD  rosuvastatin (CRESTOR) 20 MG tablet Take 20 mg by mouth  daily.   Yes Historical Provider, MD   Allergies  Allergen Reactions  . Penicillins Other (See Comments)    Throat felt tight    FAMILY HISTORY:  indicated that her mother is deceased. She indicated that her father is deceased.  SOCIAL HISTORY:  reports that she has never smoked. She has never used smokeless tobacco. She reports that she does not drink alcohol or use illicit drugs.     SUBJECTIVE:  nad at rest at 45 degrees on 02 2lpm   VITAL SIGNS: Temp:  [97.7 F (36.5 C)-98.1 F (36.7 C)] 97.9 F (36.6 C) (10/31 0542) Pulse Rate:  [62-69] 62 (10/31 0542) Resp:  [18] 18 (10/31 0542) BP: (93-101)/(41-53) 101/53 mmHg (10/31 0542) SpO2:  [95 %-98 %] 95 % (10/31 0542) Weight:  [218 lb 0.6 oz (98.9 kg)] 218 lb 0.6 oz (98.9 kg) (10/31 0542) HEMODYNAMICS:   VENTILATOR SETTINGS:   INTAKE / OUTPUT:  Intake/Output Summary (Last 24 hours) at 06/14/14 1237 Last data filed at 06/14/14 1000  Gross per 24 hour  Intake    483 ml  Output    750 ml  Net   -267 ml    PHYSICAL EXAMINATION: General: elderly talkative wf nad  HEENT: nl dentition, turbinates, and orophanx. Nl external ear canals without cough reflex   NECK :  without JVD/Nodes/TM/ nl carotid upstrokes bilaterally   LUNGS: no acc muscle use, clear to A and P bilaterally without cough on insp or exp maneuvers   CV:  RRR  no s3 or murmur or increase in P2, trace sym bilateral pitting  edema   ABD:  Very obese but soft and nontender with nl excursion in the supine position. No bruits or organomegaly, bowel sounds nl  MS:  warm without deformities, calf tenderness, cyanosis or clubbing  SKIN: warm and dry without lesions    NEURO:  alert, approp, no deficits   LABS:  CBC  Recent Labs Lab 06/11/14 2214 06/12/14 0421 06/14/14 0405  WBC 9.0 8.6 9.1  HGB 11.8* 11.4* 10.7*  HCT 36.3 35.6* 33.4*  PLT 166 148* 154   Coag's No results found for this basename: APTT, INR,  in the last 168  hours BMET  Recent Labs Lab 06/12/14 0421 06/13/14 0338 06/14/14 0405  NA 144 143 140  K 4.2 3.6* 3.3*  CL 105 101 100  CO2 28 29 28   BUN 34* 41* 46*  CREATININE 1.70* 1.86* 1.80*  GLUCOSE 117* 116* 119*   Electrolytes  Recent Labs Lab 06/12/14 0421 06/13/14 0338 06/14/14 0405  CALCIUM 9.9 10.0 9.6       Recent Labs Lab 06/11/14 1814 06/11/14 2214 06/12/14 0421 06/12/14 1010  TROPONINI  --  0.66* 0.61* 0.59*  PROBNP 10167.0*  --   --   --    Glucose  Recent Labs Lab 06/12/14 2104 06/13/14 0722 06/13/14 1153 06/13/14 1640 06/13/14 2127 06/14/14 0756  GLUCAP 133* 136* 135* 132* 156* 120*    Imaging Dg Chest 2 View  06/13/2014   CLINICAL DATA:  Shortness of Breath, hypertension, diabetes  EXAM: CHEST  2 VIEW  COMPARISON:  06/11/2014  FINDINGS: Cardiomediastinal silhouette is stable. No acute infiltrate or pleural effusion.  No pulmonary edema. Osteopenia and mild degenerative changes thoracic spine.  IMPRESSION: No active cardiopulmonary disease.   Electronically Signed   By: Lahoma Crocker M.D.   On: 06/13/2014 08:53   Ct Chest Wo Contrast  06/13/2014   CLINICAL DATA:  Shortness of breath.  EXAM: CT CHEST WITHOUT CONTRAST  TECHNIQUE: Multidetector CT imaging of the chest was performed following the standard protocol without IV contrast.  COMPARISON:  Chest radiograph and V/Q scan of same day.  FINDINGS: No pneumothorax or pleural effusion is noted. 7 x 3 mm nodule is seen anteriorly in the left lower lobe best seen on image number 41 of series 5. Fusiform density is seen in the right middle lobe medially that measures 35 x 11 x 10 mm. No mediastinal mass or adenopathy is noted. Atherosclerotic calcifications of thoracic aorta are noted. No significant abnormality seen in the visualized portion of the upper abdomen  IMPRESSION: 35 x 11 x 10 mm fusiform density is seen in the right middle lobe medially. The possibility of neoplasm or malignancy must be considered, and  further evaluation with PET scan is recommended.  Also noted is 7 x 3 mm nodule anteriorly in the left lower lobe. If the patient is at high risk for bronchogenic carcinoma, follow-up chest CT at 3-27months is recommended. If the patient is at low risk for bronchogenic carcinoma, follow-up chest CT at 6-12 months is recommended. This recommendation follows the consensus statement: Guidelines for Management of Small Pulmonary Nodules Detected on CT Scans: A Statement from the [employer] as published in Radiology 2005; 237:395-400.   Electronically Signed   By: Sabino Dick M.D.   On: 06/13/2014 16:33   Nm Pulmonary Perf And Vent  06/13/2014   CLINICAL DATA:  Dyspnea for 5 days.  EXAM: NUCLEAR MEDICINE VENTILATION - PERFUSION LUNG SCAN  TECHNIQUE: Ventilation images were obtained in multiple projections using inhaled aerosol technetium 99 M DTPA. Perfusion images were obtained in multiple projections after intravenous injection of Tc-49m MAA.  RADIOPHARMACEUTICALS:  44.8 mCi Tc-70m DTPA aerosol and 5 point to mCi Tc-21m MAA  COMPARISON:  Chest radiographs 06/13/2014  FINDINGS: Ventilation: Diffusely heterogeneous tracer, particularly peripherally, suggestive of underlying COPD, with some central airway tracer deposition.  Perfusion: There is a moderate sized matched defect in the superior segment left lower lobe. There is also a moderate sized matched defect in the anterior segment right upper lobe. Perfusion is diffusely diminished throughout the right lung compared to the left.  IMPRESSION: Intermediate probability for pulmonary embolus. No mismatched defects are identified, however there is asymmetrically diminished perfusion throughout the right lung. Potential causes of this include hilar obstruction due to mass or lymphadenopathy, pulmonary artery stenosis, as well as proximal pulmonary embolus. Due to the patient's diminished renal function, a noncontrast chest CT is recommended to assess for a  central obstructing lesion.   Electronically Signed   By: Logan Bores   On: 06/13/2014 12:26     ASSESSMENT / PLAN:  1) Acute PH secondary to likely multiple PE which are underestimated by v/q. - Rec min of 6 months of lovenox bridged to coumdin or a factor Xa inhibitor adjusted to creat clearance.  - repeat Echo at 3 months   2) Multiple Pulmonary nodules/ never smoker so very unlikely this is neoplasm though the new clotting problem does raise the issue of acquired hypercoagulability.  I don't know of a single life saved looking for metastatic ca as cause so I certainly don't recommend a  bx on anything having just started full anticoagulation - Rec repeat CT s contrast in 3 months    3) Acute respiratory failure with hypoxemia secondary to PE - titrate 02 off for sats > 92 % in setting of new dx of TEPAH noting the management is no different for the first 3 months and many would say 6 in this setting    I would be happy to see her back @ 3 m or sooner if needed to coordinate these studies or defer it to Dr Loren Racer capable hands     Christinia Gully, MD Pulmonary and [place] [phone] After 5:30 PM or weekends, call [phone]

## 2014-06-14 NOTE — Progress Notes (Signed)
VASCULAR LAB PRELIMINARY  PRELIMINARY  PRELIMINARY  PRELIMINARY  Bilateral lower extremity venous duplex completed.    Preliminary report:  Positive for  a small deep vein thrombosis of the proximal popliteal vein , age undetermined. No evidence of a superficial thrombosis or Baker's cyst. Left:  No evidence of DVT, superficial thrombosis, or Baker's cyst.  Alasha Mcguinness, RVS 06/14/2014, 10:29 AM

## 2014-06-14 NOTE — Progress Notes (Signed)
ANTICOAGULATION CONSULT NOTE - Initial Consult  Pharmacy Consult for Lovenox Indication: VTE treatment  Allergies  Allergen Reactions  . Penicillins Other (See Comments)    Throat felt tight    Patient Measurements: Height: 5\' 8"  (172.7 cm) Weight: 218 lb 0.6 oz (98.9 kg) IBW/kg (Calculated) : 63.9  Vital Signs: Temp: 97.9 F (36.6 C) (10/31 0542) Temp Source: Oral (10/31 0542) BP: 101/53 mmHg (10/31 0542) Pulse Rate: 62 (10/31 0542)  Labs:  Recent Labs  06/11/14 2214 06/12/14 0421 06/12/14 1010 06/13/14 0338 06/14/14 0405  HGB 11.8* 11.4*  --   --  10.7*  HCT 36.3 35.6*  --   --  33.4*  PLT 166 148*  --   --  154  CREATININE 1.67* 1.70*  --  1.86* 1.80*  TROPONINI 0.66* 0.61* 0.59*  --   --     Estimated Creatinine Clearance: 28.6 ml/min (by C-G formula based on Cr of 1.8).   Medical History: Past Medical History  Diagnosis Date  . Hypertension   . Diabetes mellitus without complication   . Arthritis   . Hyperlipidemia   . Urinary incontinence     Assessment: 52 yoF with PMH of diastolic CHF, CKD stage III, HTN, hyperlipidemia, DM, hypothyroidism. Bilateral lower extremity venous duplex revealed a small DVT of the proximal popliteal vein. Pharmacy consulted to dose Lovenox for VTE treatment.  CBC: Hgb low but stable, Pltc WNL Renal: SCr 1.80 CrCl 53ml/min (N)  Goal of Therapy:  Appropriate anticoagulation for pt with DVT Monitor platelets by anticoagulation protocol: Yes   Plan:  Start Lovenox 100mg  subq 24H Follow renal function, CBC  Kizzie Furnish, PharmD Pager: [phone] 06/14/2014 11:07 AM

## 2014-06-14 NOTE — Progress Notes (Addendum)
Patient Name: Melissa Shannon Date of Encounter: 06/14/2014    SUBJECTIVE: Breathing is improved. Lower extremity swelling is improved.  TELEMETRY:  Normal sinus rhythm and sinus bradycardia Filed Vitals:   06/13/14 0454 06/13/14 1430 06/13/14 2124 06/14/14 0542  BP: 103/45 93/47 93/41  101/53  Pulse: 57 62 69 62  Temp: 98.2 F (36.8 C) 97.7 F (36.5 C) 98.1 F (36.7 C) 97.9 F (36.6 C)  TempSrc: Oral Oral Oral Oral  Resp: 20 18 18 18   Height:      Weight: 217 lb 4.8 oz (98.567 kg)   218 lb 0.6 oz (98.9 kg)  SpO2: 99% 97% 98% 95%    Intake/Output Summary (Last 24 hours) at 06/14/14 0746 Last data filed at 06/14/14 0555  Gross per 24 hour  Intake    600 ml  Output    550 ml  Net     50 ml   LABS: Basic Metabolic Panel:  Recent Labs  06/13/14 0338 06/14/14 0405  NA 143 140  K 3.6* 3.3*  CL 101 100  CO2 29 28  GLUCOSE 116* 119*  BUN 41* 46*  CREATININE 1.86* 1.80*  CALCIUM 10.0 9.6   CBC:  Recent Labs  06/12/14 0421 06/14/14 0405  WBC 8.6 9.1  HGB 11.4* 10.7*  HCT 35.6* 33.4*  MCV 90.6 90.0  PLT 148* 154   Cardiac Enzymes:  Recent Labs  06/11/14 2214 06/12/14 0421 06/12/14 1010  TROPONINI 0.66* 0.61* 0.59*   BNP: No components found with this basename: POCBNP,  Hemoglobin A1C:  Recent Labs  06/11/14 2214  HGBA1C 6.9*   BNP    Component Value Date/Time   PROBNP 10167.0* 06/11/2014 1814   Echocardiogram: 06/12/14 Study Conclusions  - Left ventricle: The cavity size was normal. Wall thickness was normal. Systolic function was normal. The estimated ejection fraction was in the range of 60% to 65%. Wall motion was normal; there were no regional wall motion abnormalities. Doppler parameters are consistent with abnormal left ventricular relaxation (grade 1 diastolic dysfunction). - Ventricular septum: D-shaped interventricular septum and compression of the LV, suggesting RV pressure/volume overload. - Aortic valve: Poorly  visualized. Probably trileaflet. There was no stenosis. - Mitral valve: There was no significant regurgitation. - Right ventricle: The cavity size was severely dilated. Systolic function was moderately reduced. - Right atrium: The atrium was moderately dilated. - Tricuspid valve: Peak RV-RA gradient (S): 39 mm Hg. - Pulmonary arteries: PA peak pressure: 47 mm Hg (S). - Systemic veins: IVC measured 2.3 cm with normal respirophasic variation, suggesting RA pressure 8 mmHg.    Radiology/Studies:  Ventilation Perfusion nuclear scan: 06/13/14 IMPRESSION:  Intermediate probability for pulmonary embolus. No mismatched  defects are identified, however there is asymmetrically diminished  perfusion throughout the right lung. Potential causes of this  include hilar obstruction due to mass or lymphadenopathy, pulmonary  artery stenosis, as well as proximal pulmonary embolus. Due to the  patient's diminished renal function, a noncontrast chest CT is  recommended to assess for a central obstructing lesion.   Non Contrast Chest CT: 06/13/14 IMPRESSION:  35 x 11 x 10 mm fusiform density is seen in the right middle lobe  medially. The possibility of neoplasm or malignancy must be  considered, and further evaluation with PET scan is recommended.  Also noted is 7 x 3 mm nodule anteriorly in the left lower lobe. If  the patient is at high risk for bronchogenic carcinoma, follow-up  chest CT at  3-34months is recommended. If the patient is at low risk  for bronchogenic carcinoma, follow-up chest CT at 6-12 months is  recommended. This recommendation follows the consensus statement:  Guidelines for Management of Small Pulmonary Nodules Detected on CT  Scans: A Statement from the [employer] as published in  Radiology 2005; 237:395-400.   Physical Exam: Blood pressure 101/53, pulse 62, temperature 97.9 F (36.6 C), temperature source Oral, resp. rate 18, height 5\' 8"  (1.727 m), weight 218 lb  0.6 oz (98.9 kg), SpO2 95.00%. Weight change: 11.8 oz (0.333 kg)  Wt Readings from Last 3 Encounters:  06/14/14 218 lb 0.6 oz (98.9 kg)  07/04/13 245 lb (111.131 kg)   Patient is lying flat without dyspnea Elevated neck veins. Lower extremities is soft without obvious edema Cardiac exam reveals an S4 gallop. No significant murmurs heard. Abdomen soft without ascites Neurological exam is unremarkable  ASSESSMENT:  1. Right heart failure, chronicity unknown. Moderate pulmonary hypertension noted by echocardiography. 2. Diastolic left heart failure, acute on chronic, markedly improved 3. Abnormal CT scan with suggesting right lung cancer. Could this finding be a pulmonary infarction? 4. Elevated troponin levels likely due to demand ischemia. Underlying coronary disease needs to be excluded with a myocardial perfusion study when appropriate 5. Acute on chronic kidney failure, this impacts the use of diuretic therapy   Plan:  1. Careful diuresis given the patient's right heart failure and pulmonary hypertension. Monitor renal function daily. 2. Further workup in characterization of the pulmonary abnormalities which suggest the possibility of lung cancer 3. DVT prophylaxis and consideration of full anticoagulation if there is concern for PE based upon the intermediate probability V/Q and abnormal chest CT (pulmonary infarction). 4. Consider pulmonary consult  Signed, Sinclair Grooms 06/14/2014, 7:46 AM

## 2014-06-15 DIAGNOSIS — I5032 Chronic diastolic (congestive) heart failure: Secondary | ICD-10-CM

## 2014-06-15 LAB — CBC
HCT: 35 % — ABNORMAL LOW (ref 36.0–46.0)
Hemoglobin: 11.6 g/dL — ABNORMAL LOW (ref 12.0–15.0)
MCH: 29.7 pg (ref 26.0–34.0)
MCHC: 33.1 g/dL (ref 30.0–36.0)
MCV: 89.7 fL (ref 78.0–100.0)
PLATELETS: 165 10*3/uL (ref 150–400)
RBC: 3.9 MIL/uL (ref 3.87–5.11)
RDW: 13.1 % (ref 11.5–15.5)
WBC: 7.7 10*3/uL (ref 4.0–10.5)

## 2014-06-15 LAB — GLUCOSE, CAPILLARY
GLUCOSE-CAPILLARY: 198 mg/dL — AB (ref 70–99)
Glucose-Capillary: 131 mg/dL — ABNORMAL HIGH (ref 70–99)
Glucose-Capillary: 146 mg/dL — ABNORMAL HIGH (ref 70–99)
Glucose-Capillary: 133 mg/dL — ABNORMAL HIGH (ref 70–99)

## 2014-06-15 LAB — BASIC METABOLIC PANEL
ANION GAP: 11 (ref 5–15)
BUN: 47 mg/dL — ABNORMAL HIGH (ref 6–23)
CHLORIDE: 102 meq/L (ref 96–112)
CO2: 28 meq/L (ref 19–32)
CREATININE: 1.76 mg/dL — AB (ref 0.50–1.10)
Calcium: 9.3 mg/dL (ref 8.4–10.5)
GFR calc Af Amer: 29 mL/min — ABNORMAL LOW (ref >90)
GFR calc non Af Amer: 25 mL/min — ABNORMAL LOW (ref >90)
GLUCOSE: 114 mg/dL — AB (ref 70–99)
Potassium: 3.6 mEq/L — ABNORMAL LOW (ref 3.7–5.3)
Sodium: 141 mEq/L (ref 137–147)

## 2014-06-15 MED ORDER — VITAMINS A & D EX OINT
TOPICAL_OINTMENT | CUTANEOUS | Status: AC
  Administered 2014-06-15: 01:00:00
  Filled 2014-06-15: qty 5

## 2014-06-15 MED ORDER — POTASSIUM CHLORIDE CRYS ER 20 MEQ PO TBCR
20.0000 meq | EXTENDED_RELEASE_TABLET | Freq: Once | ORAL | Status: AC
  Administered 2014-06-15: 20 meq via ORAL
  Filled 2014-06-15: qty 1

## 2014-06-15 NOTE — Progress Notes (Signed)
BP 99/46, HR 67, pt asymptomatic. On call MD made aware, held Zebeta's dose at 2200. Will cont to monitor.

## 2014-06-15 NOTE — Plan of Care (Signed)
Problem: Phase II Progression Outcomes Goal: Walk in hall or up in chair TID Outcome: Completed/Met Date Met:  06/15/14

## 2014-06-15 NOTE — Progress Notes (Signed)
       Patient Name: Melissa Shannon Date of Encounter: 06/15/2014    SUBJECTIVE:feels better this morning. No cough. Somewhat dizzy when she stands.  TELEMETRY:  Normal sinus rhythm: Filed Vitals:   06/14/14 1400 06/14/14 2203 06/15/14 0538 06/15/14 0612  BP: 94/51 99/46 94/48  110/55  Pulse: 70 67 62 63  Temp:  97.4 F (36.3 C) 97.1 F (36.2 C)   TempSrc: Oral Oral Oral   Resp: 18 16 16    Height:      Weight:   216 lb 7.9 oz (98.2 kg)   SpO2: 98% 97% 97%     Intake/Output Summary (Last 24 hours) at 06/15/14 0655 Last data filed at 06/15/14 [id number]  Gross per 24 hour  Intake      3 ml  Output   1300 ml  Net  -1297 ml   LABS: Basic Metabolic Panel:  Recent Labs  06/14/14 0405 06/15/14 0530  NA 140 141  K 3.3* 3.6*  CL 100 102  CO2 28 28  GLUCOSE 119* 114*  BUN 46* 47*  CREATININE 1.80* 1.76*  CALCIUM 9.6 9.3   CBC:  Recent Labs  06/14/14 0405 06/15/14 0530  WBC 9.1 7.7  HGB 10.7* 11.6*  HCT 33.4* 35.0*  MCV 90.0 89.7  PLT 154 165   Cardiac Enzymes:  Recent Labs  06/12/14 1010  TROPONINI 0.59*     Radiology/Studies:  No new data  Physical Exam: Blood pressure 110/55, pulse 63, temperature 97.1 F (36.2 C), temperature source Oral, resp. rate 16, height 5\' 8"  (1.727 m), weight 216 lb 7.9 oz (98.2 kg), SpO2 97 %. Weight change: -1 lb 8.7 oz (-0.7 kg)  Wt Readings from Last 3 Encounters:  06/15/14 216 lb 7.9 oz (98.2 kg)  07/04/13 245 lb (111.131 kg)    Moderate JVD with patient lying flat Chest decreased breath sounds at the bases Cardiac exam reveals no  Rub or gallop. He 2/6 systolic murmur is heard at the left lower sternal border No significant lower extremity edema  ASSESSMENT:  1. Pulmonary hypertension with right heart failure likely multifactorial related to diastolic left heart failure and pulmonary emboli 2. Elevated troponins felt secondary to demand 3. Diastolic left heart failure, compensated with modest diuresis. 4. Acute  on chronic kidney injury, stable/improving  Plan:  Agree with full dose anticoagulation under the circumstances Low-dose diuretic therapy as tolerated by blood pressure and renal function. Seems to be tolerating the oral diuretic regimen.  Demetrios Isaacs 06/15/2014, 6:55 AM

## 2014-06-15 NOTE — Progress Notes (Addendum)
[facility] PROGRESS NOTE  Melissa Shannon [id number] DOB: [birth year] DOA: 06/11/2014 PCP: No primary care provider on file.  Assessment/Plan:  Acute Diastolic CHF:  -Chest x ray with interstitial prominence suggesting mild Pneumonitis. BNP elevated at 10,167 -ECHO pulmonary HTN, diastolic dysfunction grade 1.   -Negative 2.7 L.  -cardiology consulted. Appreciate cardiology recommendation -continue with oral lasix, monitor renal function.   Pulmonary HTN, probably secondary to PE.  -Intermediate probability V-Q scan.  -Radiology recommend CT chest to evaluate for mass.  -CT Chest : 35 x 11 x 10 mm fusiform density is seen in the right middle lobe medially. Also noted is 7 x 3 mm nodule anteriorly in the left lower lobe. If the patient is at high risk for bronchogenic carcinoma, -Doppler negative. Final doppler report was negative for DVT.  -Continue with Lovenox. If renal function improved, will consider transition to eliquis.  -need ECHO in 3 month.   Multiple Pulmonary nodules: Lung nodule, and fusiform density 35 X 11 mm: Pulmonary consulted.  Appreciate pulmonary recommendation. Need CT contrast scan in 3 month.    Elevated troponin: could be demand ischemia in setting of heart failure. Also patient with KCD.   CKD stage III:  Last Cr per records 2014 at 1.6.  Monitor on lasix.  Hold ACE.  Cr today at 1.7  Hypertension -hold Norvasc, HCTZ, and ACE.  -Continue with bistolic.   Hyperlipidemia -continue statins   Type 2 Diabetes Uncomplicated -DC metformin due to renal failure.  -SSI.  - A1C 6.9  5. Hypothyroid  -TSH; 0.68  6-Dizziness; check orthostatic.    Code Status: Full Code.  Family Communication: care discussed with patient.  Disposition Plan: remain inpatient. Needs PT assessment.    Consultants:  Cardiology  Pulmonary   Procedures:  ECHO pulmonary HTN, diastolic dysfunction.   Doppler: Positive for a small deep vein thrombosis of  the proximal popliteal vein , age undetermined. No evidence of a superficial thrombosis or Baker's cyst. Left: No evidence of DVT, superficial thrombosis, or Baker's cyst.   Antibiotics: None  HPI/Subjective: She is feeling better. She was dizzy early this morning.  No chest pain, no dyspnea.   Objective: Filed Vitals:   06/15/14 0904  BP: 127/51  Pulse: 68  Temp:   Resp:     Intake/Output Summary (Last 24 hours) at 06/15/14 1323 Last data filed at 06/15/14 0945  Gross per 24 hour  Intake      0 ml  Output   1400 ml  Net  -1400 ml   Filed Weights   06/13/14 0454 06/14/14 0542 06/15/14 0538  Weight: 98.567 kg (217 lb 4.8 oz) 98.9 kg (218 lb 0.6 oz) 98.2 kg (216 lb 7.9 oz)    Exam:   General:  Alert in no distress.   Cardiovascular: S 1, S 2 RRR  Respiratory: CTA  Abdomen: Bs present, soft, NT  Musculoskeletal: trace edema.    Data Reviewed: Basic Metabolic Panel:  Recent Labs Lab 06/11/14 1814 06/11/14 2214 06/12/14 0421 06/13/14 0338 06/14/14 0405 06/15/14 0530  NA 141  --  144 143 140 141  K 4.3  --  4.2 3.6* 3.3* 3.6*  CL 102  --  105 101 100 102  CO2 23  --  28 29 28 28   GLUCOSE 136*  --  117* 116* 119* 114*  BUN 30*  --  34* 41* 46* 47*  CREATININE 1.52* 1.67* 1.70* 1.86* 1.80* 1.76*  CALCIUM 10.1  --  9.9 10.0  9.6 9.3   Liver Function Tests:  Recent Labs Lab 06/12/14 0421  AST 10  ALT 6  ALKPHOS 61  BILITOT 0.4  PROT 5.9*  ALBUMIN 3.1*   No results for input(s): LIPASE, AMYLASE in the last 168 hours. No results for input(s): AMMONIA in the last 168 hours. CBC:  Recent Labs Lab 06/11/14 1814 06/11/14 2214 06/12/14 0421 06/14/14 0405 06/15/14 0530  WBC 8.7 9.0 8.6 9.1 7.7  HGB 12.6 11.8* 11.4* 10.7* 11.6*  HCT 38.6 36.3 35.6* 33.4* 35.0*  MCV 90.0 89.6 90.6 90.0 89.7  PLT 173 166 148* 154 165   Cardiac Enzymes:  Recent Labs Lab 06/11/14 2214 06/12/14 0421 06/12/14 1010  TROPONINI 0.66* 0.61* 0.59*   BNP (last 3  results)  Recent Labs  06/11/14 1814  PROBNP 10167.0*   CBG:  Recent Labs Lab 06/14/14 1233 06/14/14 1640 06/14/14 2209 06/15/14 0734 06/15/14 1141  GLUCAP 156* 112* 118* 131* 198*    No results found for this or any previous visit (from the past 240 hour(s)).   Studies: Ct Chest Wo Contrast  06/13/2014   CLINICAL DATA:  Shortness of breath.  EXAM: CT CHEST WITHOUT CONTRAST  TECHNIQUE: Multidetector CT imaging of the chest was performed following the standard protocol without IV contrast.  COMPARISON:  Chest radiograph and V/Q scan of same day.  FINDINGS: No pneumothorax or pleural effusion is noted. 7 x 3 mm nodule is seen anteriorly in the left lower lobe best seen on image number 41 of series 5. Fusiform density is seen in the right middle lobe medially that measures 35 x 11 x 10 mm. No mediastinal mass or adenopathy is noted. Atherosclerotic calcifications of thoracic aorta are noted. No significant abnormality seen in the visualized portion of the upper abdomen  IMPRESSION: 35 x 11 x 10 mm fusiform density is seen in the right middle lobe medially. The possibility of neoplasm or malignancy must be considered, and further evaluation with PET scan is recommended.  Also noted is 7 x 3 mm nodule anteriorly in the left lower lobe. If the patient is at high risk for bronchogenic carcinoma, follow-up chest CT at 3-58months is recommended. If the patient is at low risk for bronchogenic carcinoma, follow-up chest CT at 6-12 months is recommended. This recommendation follows the consensus statement: Guidelines for Management of Small Pulmonary Nodules Detected on CT Scans: A Statement from the [employer] as published in Radiology 2005; 237:395-400.   Electronically Signed   By: Sabino Dick M.D.   On: 06/13/2014 16:33    Scheduled Meds: . aspirin EC  81 mg Oral Daily  . bisoprolol  5 mg Oral BID  . enoxaparin (LOVENOX) injection  100 mg Subcutaneous Q24H  . folic acid  1 mg Oral  Daily  . furosemide  40 mg Oral Daily  . insulin aspart  0-9 Units Subcutaneous TID WC  . levothyroxine  100 mcg Oral QAC breakfast  . multivitamin with minerals  1 tablet Oral Daily  . potassium chloride  20 mEq Oral Once  . rosuvastatin  20 mg Oral q1800  . sodium chloride  3 mL Intravenous Q12H  . sodium chloride  3 mL Intravenous Q12H  . thiamine  100 mg Oral Daily   Continuous Infusions:   Principal Problem:   CHF (congestive heart failure) Active Problems:   Hypertension   Diabetes mellitus without complication   Hyperlipemia   Congestive heart failure   Malnutrition of moderate degree   Right heart  failure due to pulmonary hypertension   Pulmonary hypertension   Multiple pulmonary nodules   Acute respiratory failure with hypoxia    Time spent: 35 minutes.     Niel Hummer A  [facility] Pager [phone]. If 7PM-7AM, please contact night-coverage at [email or web address], password [facility] 06/15/2014, 1:23 PM  LOS: 4 days

## 2014-06-16 DIAGNOSIS — I5023 Acute on chronic systolic (congestive) heart failure: Secondary | ICD-10-CM

## 2014-06-16 LAB — BASIC METABOLIC PANEL
ANION GAP: 10 (ref 5–15)
Anion gap: 9 (ref 5–15)
BUN: 44 mg/dL — ABNORMAL HIGH (ref 6–23)
BUN: 40 mg/dL — AB (ref 6–23)
CHLORIDE: 99 meq/L (ref 96–112)
CO2: 31 mEq/L (ref 19–32)
CO2: 33 mEq/L — ABNORMAL HIGH (ref 19–32)
CREATININE: 1.51 mg/dL — AB (ref 0.50–1.10)
Calcium: 9.8 mg/dL (ref 8.4–10.5)
Calcium: 9.6 mg/dL (ref 8.4–10.5)
Chloride: 100 mEq/L (ref 96–112)
Creatinine, Ser: 1.64 mg/dL — ABNORMAL HIGH (ref 0.50–1.10)
GFR calc Af Amer: 32 mL/min — ABNORMAL LOW (ref >90)
GFR calc Af Amer: 35 mL/min — ABNORMAL LOW (ref >90)
GFR calc non Af Amer: 28 mL/min — ABNORMAL LOW (ref >90)
GFR calc non Af Amer: 31 mL/min — ABNORMAL LOW (ref >90)
GLUCOSE: 219 mg/dL — AB (ref 70–99)
Glucose, Bld: 129 mg/dL — ABNORMAL HIGH (ref 70–99)
POTASSIUM: 3.7 meq/L (ref 3.7–5.3)
POTASSIUM: 5.3 meq/L (ref 3.7–5.3)
Sodium: 140 mEq/L (ref 137–147)
Sodium: 142 mEq/L (ref 137–147)

## 2014-06-16 LAB — GLUCOSE, CAPILLARY
GLUCOSE-CAPILLARY: 123 mg/dL — AB (ref 70–99)
Glucose-Capillary: 183 mg/dL — ABNORMAL HIGH (ref 70–99)
Glucose-Capillary: 123 mg/dL — ABNORMAL HIGH (ref 70–99)
Glucose-Capillary: 149 mg/dL — ABNORMAL HIGH (ref 70–99)

## 2014-06-16 LAB — CBC
HEMATOCRIT: 34.4 % — AB (ref 36.0–46.0)
Hemoglobin: 11 g/dL — ABNORMAL LOW (ref 12.0–15.0)
MCH: 29 pg (ref 26.0–34.0)
MCHC: 32 g/dL (ref 30.0–36.0)
MCV: 90.8 fL (ref 78.0–100.0)
PLATELETS: 154 10*3/uL (ref 150–400)
RBC: 3.79 MIL/uL — AB (ref 3.87–5.11)
RDW: 13.1 % (ref 11.5–15.5)
WBC: 8.2 10*3/uL (ref 4.0–10.5)

## 2014-06-16 LAB — PRO B NATRIURETIC PEPTIDE: PRO B NATRI PEPTIDE: 3540 pg/mL — AB (ref 0–450)

## 2014-06-16 MED ORDER — BISOPROLOL FUMARATE 5 MG PO TABS
5.0000 mg | ORAL_TABLET | Freq: Two times a day (BID) | ORAL | Status: DC
  Filled 2014-06-16 (×2): qty 1

## 2014-06-16 NOTE — Progress Notes (Signed)
Subjective: Breathing is stable  Not dizzy in bed.  Has not been up yet   Objective: Filed Vitals:   06/15/14 0904 06/15/14 1432 06/15/14 2237 06/16/14 0453  BP: 127/51  95/51 96/52  Pulse: 68  68 60  Temp:  97.5 F (36.4 C) 98.1 F (36.7 C) 97.6 F (36.4 C)  TempSrc:  Oral Oral Oral  Resp:  22 24 22   Height:      Weight:    215 lb 13.3 oz (97.9 kg)  SpO2:  97% 96% 95%   Weight change: -10.6 oz (-0.3 kg)  Intake/Output Summary (Last 24 hours) at 06/16/14 0745 Last data filed at 06/15/14 2026  Gross per 24 hour  Intake    640 ml  Output   1200 ml  Net   -560 ml    General: Alert, awake, oriented x3, in no acute distress Neck:  JVP is increased  Heart: Regular rate and rhythm, without murmurs, rubs, gallops.  Lungs: Clear to auscultation.  No rales or wheezes. Exemities:  Tr  edema.   Neuro: Grossly intact, nonfocal.  Tele:  SR   Lab Results: Results for orders placed or performed during the hospital encounter of 06/11/14 (from the past 24 hour(s))  Glucose, capillary     Status: Abnormal   Collection Time: 06/15/14 11:41 AM  Result Value Ref Range   Glucose-Capillary 198 (H) 70 - 99 mg/dL  Glucose, capillary     Status: Abnormal   Collection Time: 06/15/14  5:12 PM  Result Value Ref Range   Glucose-Capillary 146 (H) 70 - 99 mg/dL  Glucose, capillary     Status: Abnormal   Collection Time: 06/15/14 10:44 PM  Result Value Ref Range   Glucose-Capillary 133 (H) 70 - 99 mg/dL  CBC     Status: Abnormal   Collection Time: 06/16/14  4:18 AM  Result Value Ref Range   WBC 8.2 4.0 - 10.5 K/uL   RBC 3.79 (L) 3.87 - 5.11 MIL/uL   Hemoglobin 11.0 (L) 12.0 - 15.0 g/dL   HCT 34.4 (L) 36.0 - 46.0 %   MCV 90.8 78.0 - 100.0 fL   MCH 29.0 26.0 - 34.0 pg   MCHC 32.0 30.0 - 36.0 g/dL   RDW 13.1 11.5 - 15.5 %   Platelets 154 150 - 400 K/uL  Basic metabolic panel     Status: Abnormal   Collection Time: 06/16/14  4:18 AM  Result Value Ref Range   Sodium 140 137 - 147 mEq/L     Potassium 3.7 3.7 - 5.3 mEq/L   Chloride 99 96 - 112 mEq/L   CO2 31 19 - 32 mEq/L   Glucose, Bld 129 (H) 70 - 99 mg/dL   BUN 44 (H) 6 - 23 mg/dL   Creatinine, Ser 1.64 (H) 0.50 - 1.10 mg/dL   Calcium 9.8 8.4 - 10.5 mg/dL   GFR calc non Af Amer 28 (L) >90 mL/min   GFR calc Af Amer 32 (L) >90 mL/min   Anion gap 10 5 - 15    Studies/Results: No results found.  Medications: Reviewed   @PROBHOSP @  1.  Pulmonary HTN with RV failure.  Volume status has improved since admit  Prob still increased some.  BP limiting  Note pulmonary input Currently on bisoprolol  And antiicoagulation but no other agents. Will need close follow up in clinic  Will arrange for f/u in our pulm HTN clinic .   Check BNP Need instruction on low Na  diet.    2.  Dispo  Patient lives alone  Need to get PT/OT to assess    LOS: 5 days   Dorris Carnes 06/16/2014, 7:45 AM

## 2014-06-16 NOTE — Progress Notes (Signed)
PT Cancellation Note  Patient Details Name: Melissa Shannon MRN: [id number] DOB: [birth year]   Cancelled Treatment:     MD PN note "Hold PT today" hypotension   Rica Koyanagi  PTA [facility]  Acute  Rehab Pager      [phone]

## 2014-06-16 NOTE — Progress Notes (Addendum)
[facility] PROGRESS NOTE  Melissa Shannon [id number] DOB: Jun 23, 1928 DOA: 06/11/2014 PCP: No primary care provider on file.  Assessment/Plan:  Hypotension: Orthostatic. Hold diuretic, lasix. Will also hold bisoprolol. She denies chest pain, no worsening dyspnea. Oxygen sat stable.  Hold on PT therapy today. PT tomorrow if BP better and no dizziness.   Acute Diastolic CHF:  -Chest x ray with interstitial prominence suggesting mild Pneumonitis. BNP elevated at 10,167 -ECHO pulmonary HTN, diastolic dysfunction grade 1.   -Negative 2.7 L.  -cardiology consulted. Appreciate cardiology recommendation -BP low, orthostatic positive. Hold lasix.   Pulmonary HTN, probably secondary to PE.  -Intermediate probability V-Q scan.  -Radiology recommend CT chest to evaluate for mass.  -CT Chest : 35 x 11 x 10 mm fusiform density is seen in the right middle lobe medially. Also noted is 7 x 3 mm nodule anteriorly in the left lower lobe. If the patient is at high risk for bronchogenic carcinoma, -Doppler negative. Final doppler report was negative for DVT.  -Continue with Lovenox. If renal function improved, will consider transition to eliquis.  -need ECHO in 3 month.  -will consider start eliquis 11-03 if renal function continue to improved.   Multiple Pulmonary nodules: Lung nodule, and fusiform density 35 X 11 mm: Pulmonary consulted.  Appreciate pulmonary recommendation. Need CT contrast scan in 3 month.   Elevated troponin: could be demand ischemia in setting of heart failure. Also patient with KCD.   CKD stage III:  Last Cr per records 2014 at 1.6.  Monitor on lasix.  Hold ACE.  Cr today at 1.6  Hypertension -hold Norvasc, HCTZ, and ACE.  -hold Bisoprolol.  BP soft.   Hyperlipidemia -continue statins   Type 2 Diabetes Uncomplicated -DC metformin due to renal failure.  -SSI.  - A1C 6.9  5. Hypothyroid  -TSH; 0.68  6-Dizziness; orthostatic positive. Will need to hold  lasix and bisoprolol.     Code Status: Full Code.  Family Communication: care discussed with patient.  Disposition Plan: remain inpatient. Needs PT assessment.    Consultants:  Cardiology  Pulmonary   Procedures:  ECHO pulmonary HTN, diastolic dysfunction.   Doppler: Positive for a small deep vein thrombosis of the proximal popliteal vein , age undetermined. No evidence of a superficial thrombosis or Baker's cyst. Left: No evidence of DVT, superficial thrombosis, or Baker's cyst.   Antibiotics: None  HPI/Subjective: No dizziness if she is in bed.  No worsening dyspnea, no chest pain.   Objective: Filed Vitals:   06/16/14 0453  BP: 96/52  Pulse: 60  Temp: 97.6 F (36.4 C)  Resp: 22    Intake/Output Summary (Last 24 hours) at 06/16/14 1336 Last data filed at 06/16/14 1246  Gross per 24 hour  Intake    363 ml  Output    400 ml  Net    -37 ml   Filed Weights   06/14/14 0542 06/15/14 0538 06/16/14 0453  Weight: 98.9 kg (218 lb 0.6 oz) 98.2 kg (216 lb 7.9 oz) 97.9 kg (215 lb 13.3 oz)    Exam:   General:  Alert in no distress.   Cardiovascular: S 1, S 2 RRR  Respiratory: CTA  Abdomen: Bs present, soft, NT  Musculoskeletal: trace edema.    Data Reviewed: Basic Metabolic Panel:  Recent Labs Lab 06/12/14 0421 06/13/14 0338 06/14/14 0405 06/15/14 0530 06/16/14 0418  NA 144 143 140 141 140  K 4.2 3.6* 3.3* 3.6* 3.7  CL 105 101 100 102 99  CO2 28 29 28 28 31   GLUCOSE 117* 116* 119* 114* 129*  BUN 34* 41* 46* 47* 44*  CREATININE 1.70* 1.86* 1.80* 1.76* 1.64*  CALCIUM 9.9 10.0 9.6 9.3 9.8   Liver Function Tests:  Recent Labs Lab 06/12/14 0421  AST 10  ALT 6  ALKPHOS 61  BILITOT 0.4  PROT 5.9*  ALBUMIN 3.1*   No results for input(s): LIPASE, AMYLASE in the last 168 hours. No results for input(s): AMMONIA in the last 168 hours. CBC:  Recent Labs Lab 06/11/14 2214 06/12/14 0421 06/14/14 0405 06/15/14 0530 06/16/14 0418  WBC 9.0  8.6 9.1 7.7 8.2  HGB 11.8* 11.4* 10.7* 11.6* 11.0*  HCT 36.3 35.6* 33.4* 35.0* 34.4*  MCV 89.6 90.6 90.0 89.7 90.8  PLT 166 148* 154 165 154   Cardiac Enzymes:  Recent Labs Lab 06/11/14 2214 06/12/14 0421 06/12/14 1010  TROPONINI 0.66* 0.61* 0.59*   BNP (last 3 results)  Recent Labs  06/11/14 1814 06/16/14 0418  PROBNP 10167.0* 3540.0*   CBG:  Recent Labs Lab 06/15/14 1141 06/15/14 1712 06/15/14 2244 06/16/14 0730 06/16/14 1152  GLUCAP 198* 146* 133* 123* 183*    No results found for this or any previous visit (from the past 240 hour(s)).   Studies: No results found.  Scheduled Meds: . aspirin EC  81 mg Oral Daily  . bisoprolol  5 mg Oral BID  . enoxaparin (LOVENOX) injection  100 mg Subcutaneous Q24H  . folic acid  1 mg Oral Daily  . insulin aspart  0-9 Units Subcutaneous TID WC  . levothyroxine  100 mcg Oral QAC breakfast  . multivitamin with minerals  1 tablet Oral Daily  . rosuvastatin  20 mg Oral q1800  . sodium chloride  3 mL Intravenous Q12H  . sodium chloride  3 mL Intravenous Q12H  . thiamine  100 mg Oral Daily   Continuous Infusions:   Principal Problem:   CHF (congestive heart failure) Active Problems:   Hypertension   Diabetes mellitus without complication   Hyperlipemia   Congestive heart failure   Malnutrition of moderate degree   Right heart failure due to pulmonary hypertension   Pulmonary hypertension   Multiple pulmonary nodules   Acute respiratory failure with hypoxia    Time spent: 35 minutes.     Niel Hummer A  [facility] Pager [phone]. If 7PM-7AM, please contact night-coverage at [email or web address], password [email or web address] 06/16/2014, 1:36 PM  LOS: 5 days

## 2014-06-17 LAB — GLUCOSE, CAPILLARY
GLUCOSE-CAPILLARY: 239 mg/dL — AB (ref 70–99)
GLUCOSE-CAPILLARY: 163 mg/dL — AB (ref 70–99)
GLUCOSE-CAPILLARY: 154 mg/dL — AB (ref 70–99)
Glucose-Capillary: 144 mg/dL — ABNORMAL HIGH (ref 70–99)

## 2014-06-17 LAB — CBC
HEMATOCRIT: 35.2 % — AB (ref 36.0–46.0)
Hemoglobin: 11.1 g/dL — ABNORMAL LOW (ref 12.0–15.0)
MCH: 29 pg (ref 26.0–34.0)
MCHC: 31.5 g/dL (ref 30.0–36.0)
MCV: 91.9 fL (ref 78.0–100.0)
Platelets: 160 10*3/uL (ref 150–400)
RBC: 3.83 MIL/uL — AB (ref 3.87–5.11)
RDW: 13.2 % (ref 11.5–15.5)
WBC: 7.3 10*3/uL (ref 4.0–10.5)

## 2014-06-17 MED ORDER — APIXABAN 5 MG PO TABS
10.0000 mg | ORAL_TABLET | Freq: Two times a day (BID) | ORAL | Status: DC
  Administered 2014-06-17 – 2014-06-19 (×5): 10 mg via ORAL
  Filled 2014-06-17 (×6): qty 2

## 2014-06-17 MED ORDER — FUROSEMIDE 20 MG PO TABS
20.0000 mg | ORAL_TABLET | Freq: Every day | ORAL | Status: DC
  Administered 2014-06-17 – 2014-06-18 (×2): 20 mg via ORAL
  Filled 2014-06-17 (×3): qty 1

## 2014-06-17 MED ORDER — APIXABAN 5 MG PO TABS: 5.0000 mg | ORAL_TABLET | Freq: Two times a day (BID) | ORAL | Status: DC

## 2014-06-17 NOTE — Discharge Instructions (Signed)
Information on my medicine - ELIQUIS (apixaban)  This medication education was reviewed with me or my healthcare representative as part of my discharge preparation.  The pharmacist that spoke with me during my hospital stay was:  Hershal Coria, [facility]  Why was Eliquis prescribed for you? Eliquis was prescribed to treat blood clots that may have been found in the veins of your legs (deep vein thrombosis) or in your lungs (pulmonary embolism) and to reduce the risk of them occurring again.  What do You need to know about Eliquis ? The starting dose is 10 mg (two 5 mg tablets) taken TWICE daily for the FIRST SEVEN (7) DAYS, then on (enter date)  06/24/2014  the dose is reduced to ONE 5 mg tablet taken TWICE daily.  Eliquis may be taken with or without food.   Try to take the dose about the same time in the morning and in the evening. If you have difficulty swallowing the tablet whole please discuss with your pharmacist how to take the medication safely.  Take Eliquis exactly as prescribed and DO NOT stop taking Eliquis without talking to the doctor who prescribed the medication.  Stopping may increase your risk of developing a new blood clot.  Refill your prescription before you run out.  After discharge, you should have regular check-up appointments with your healthcare provider that is prescribing your Eliquis.    What do you do if you miss a dose? If a dose of ELIQUIS is not taken at the scheduled time, take it as soon as possible on the same day and twice-daily administration should be resumed. The dose should not be doubled to make up for a missed dose.  Important Safety Information A possible side effect of Eliquis is bleeding. You should call your healthcare provider right away if you experience any of the following: ? Bleeding from an injury or your nose that does not stop. ? Unusual colored urine (red or dark brown) or unusual colored stools (red or black). ? Unusual bruising  for unknown reasons. ? A serious fall or if you hit your head (even if there is no bleeding).  Some medicines may interact with Eliquis and might increase your risk of bleeding or clotting while on Eliquis. To help avoid this, consult your healthcare provider or pharmacist prior to using any new prescription or non-prescription medications, including herbals, vitamins, non-steroidal anti-inflammatory drugs (NSAIDs) and supplements.  This website has more information on Eliquis (apixaban): http://www.eliquis.com/eliquis/home

## 2014-06-17 NOTE — Progress Notes (Signed)
Primary team is adjusting meds. Nothing to add today.  Daryel November, MD

## 2014-06-17 NOTE — Plan of Care (Signed)
Problem: Phase II Progression Outcomes Goal: Dyspnea controlled with activity Outcome: Progressing     

## 2014-06-17 NOTE — Progress Notes (Addendum)
ANTICOAGULATION CONSULT NOTE - Initial Consult  Pharmacy Consult for Apixaban Indication: VTE treatment  Allergies  Allergen Reactions  . Penicillins Other (See Comments)    Throat felt tight    Patient Measurements: Height: 5\' 8"  (172.7 cm) Weight: 219 lb 2.2 oz (99.4 kg) IBW/kg (Calculated) : 63.9  Vital Signs: Temp: 98.2 F (36.8 C) (11/03 0613) Temp Source: Oral (11/03 [id number]) BP: 111/51 mmHg (11/03 [id number]) Pulse Rate: 61 (11/03 0613)  Labs:  Recent Labs  06/15/14 0530 06/16/14 0418 06/16/14 1410 06/17/14 0437  HGB 11.6* 11.0*  --  11.1*  HCT 35.0* 34.4*  --  35.2*  PLT 165 154  --  160  CREATININE 1.76* 1.64* 1.51*  --     Assessment: 23 yoF admitted with shortness of breath, acute heart failure.  Was initially started on IV heparin for elevated troponins but cardiology attributing mild troponin elevation 2/2 demand ischemia and d/c'd heparin.  Bilateral LE dopplers was positive for small DVT of proximal popliteal vein, age undetermined.  VQ scan showed intermediate probability for pulmonary embolus.  Lovenox per pharmacy started for VTE treatment.  Now, preparing for discharge and MD would like to transition to apixaban for continued VTE treatment of possible PE.   Renal: SCr elevated but has been improving.  SCr 1.51 yesterday, CrCl~42 ml/min (CG).  No dosage adjustment recommended by manufacturer for VTE treatment; however, patients with a SCr >2.5 mg/dL or CrCl <25 mL/minute (CG) were excluded from the clinical trials.  CBC: Hgb low/stable, platelets WNL  Goal of Therapy:  VTE treatment   Plan:  1.  Start apixaban 10 mg BID x 7 days (last dose 11/9 PM) then start 5 mg BID on 11/10 AM.  Give first 10 mg dose today at 1200 (when next Lovenox dose was due). 2.  Recommend continued monitoring of SCr outpatient. 3.  Pharmacy will provide education prior to discharge.  Hershal Coria 06/17/2014,10:25 AM  Addendum: 06/17/2014 11:24 AM Patient education  completed.  Hershal Coria, PharmD, BCPS Pager: [phone] 06/17/2014 11:24 AM

## 2014-06-17 NOTE — Progress Notes (Signed)
Insurance check for medications: co pay for eliquis 15mg  qd is $65.00 and lovenox 100mg  twice a day is $125.00 per Julie/ [facility].

## 2014-06-17 NOTE — Progress Notes (Signed)
[facility] PROGRESS NOTE  Melissa Shannon [id number] DOB: November 26, 1927 DOA: 06/11/2014 PCP: No primary care provider on file.  Assessment/Plan:  Hypotension: improved. Continue to  hold bisoprolol. She denies chest pain, no worsening dyspnea. Oxygen sat stable.   Acute Diastolic CHF:  -Chest x ray with interstitial prominence suggesting mild Pneumonitis. BNP elevated at 10,167 -ECHO pulmonary HTN, diastolic dysfunction grade 1.   -Negative 2.7 L.  -cardiology consulted. Appreciate cardiology recommendation -BP improved. Will resume lasix lower dose. Monitor for hypotension.   Pulmonary HTN, probably secondary to PE.  -Intermediate probability V-Q scan.  -Radiology recommend CT chest to evaluate for mass.  -CT Chest : 35 x 11 x 10 mm fusiform density is seen in the right middle lobe medially. Also noted is 7 x 3 mm nodule anteriorly in the left lower lobe. If the patient is at high risk for bronchogenic carcinoma, -Doppler negative. Final doppler report was negative for DVT.  -Continue with Lovenox. If renal function improved, will consider transition to eliquis.  -need ECHO in 3 month.  Start Eliquis, renal function stable.   Multiple Pulmonary nodules: Lung nodule, and fusiform density 35 X 11 mm: Pulmonary consulted.  Appreciate pulmonary recommendation. Need CT contrast scan in 3 month.   Elevated troponin: could be demand ischemia in setting of heart failure. Also patient with KCD.   CKD stage III:  Last Cr per records 2014 at 1.6.  Monitor on lasix.  Hold ACE.  Cr today at 1.5  Hypertension -hold Norvasc, HCTZ, and ACE.  -hold Bisoprolol.  BP soft.   Hyperlipidemia -continue statins   Type 2 Diabetes Uncomplicated -DC metformin due to renal failure.  -SSI.  - A1C 6.9  5. Hypothyroid  -TSH; 0.68  6-Dizziness; orthostatic positive. Hold  bisoprolol.  Feeling better. Need to be careful while doing PT evaluation.    Code Status: Full Code.  Family  Communication: Care discussed with patient.  Disposition Plan: remain inpatient. Needs PT assessment.    Consultants:  Cardiology  Pulmonary   Procedures:  ECHO pulmonary HTN, diastolic dysfunction.   Doppler: negative for DVT.    Antibiotics: None  HPI/Subjective: No dizziness if she is in bed.  Breathing well. Feels better than yesterday,   Objective: Filed Vitals:   06/17/14 1341  BP: 108/56  Pulse:   Temp: 97.7 F (36.5 C)  Resp: 18    Intake/Output Summary (Last 24 hours) at 06/17/14 1342 Last data filed at 06/17/14 1130  Gross per 24 hour  Intake    480 ml  Output   1500 ml  Net  -1020 ml   Filed Weights   06/15/14 0538 06/16/14 0453 06/17/14 0613  Weight: 98.2 kg (216 lb 7.9 oz) 97.9 kg (215 lb 13.3 oz) 99.4 kg (219 lb 2.2 oz)    Exam:   General:  Alert in no distress.   Cardiovascular: S 1, S 2 RRR  Respiratory: CTA  Abdomen: Bs present, soft, NT  Musculoskeletal: trace edema.    Data Reviewed: Basic Metabolic Panel:  Recent Labs Lab 06/13/14 0338 06/14/14 0405 06/15/14 0530 06/16/14 0418 06/16/14 1410  NA 143 140 141 140 142  K 3.6* 3.3* 3.6* 3.7 5.3  CL 101 100 102 99 100  CO2 29 28 28 31  33*  GLUCOSE 116* 119* 114* 129* 219*  BUN 41* 46* 47* 44* 40*  CREATININE 1.86* 1.80* 1.76* 1.64* 1.51*  CALCIUM 10.0 9.6 9.3 9.8 9.6   Liver Function Tests:  Recent Labs Lab 06/12/14  0421  AST 10  ALT 6  ALKPHOS 61  BILITOT 0.4  PROT 5.9*  ALBUMIN 3.1*   No results for input(s): LIPASE, AMYLASE in the last 168 hours. No results for input(s): AMMONIA in the last 168 hours. CBC:  Recent Labs Lab 06/12/14 0421 06/14/14 0405 06/15/14 0530 06/16/14 0418 06/17/14 0437  WBC 8.6 9.1 7.7 8.2 7.3  HGB 11.4* 10.7* 11.6* 11.0* 11.1*  HCT 35.6* 33.4* 35.0* 34.4* 35.2*  MCV 90.6 90.0 89.7 90.8 91.9  PLT 148* 154 165 154 160   Cardiac Enzymes:  Recent Labs Lab 06/11/14 2214 06/12/14 0421 06/12/14 1010  TROPONINI 0.66*  0.61* 0.59*   BNP (last 3 results)  Recent Labs  06/11/14 1814 06/16/14 0418  PROBNP 10167.0* 3540.0*   CBG:  Recent Labs Lab 06/16/14 0730 06/16/14 1152 06/16/14 1631 06/16/14 2213 06/17/14 0741  GLUCAP 123* 183* 123* 149* 144*    No results found for this or any previous visit (from the past 240 hour(s)).   Studies: No results found.  Scheduled Meds: . apixaban  10 mg Oral BID   Followed by  . [START ON 06/24/2014] apixaban  5 mg Oral BID  . aspirin EC  81 mg Oral Daily  . folic acid  1 mg Oral Daily  . furosemide  20 mg Oral Daily  . insulin aspart  0-9 Units Subcutaneous TID WC  . levothyroxine  100 mcg Oral QAC breakfast  . multivitamin with minerals  1 tablet Oral Daily  . rosuvastatin  20 mg Oral q1800  . sodium chloride  3 mL Intravenous Q12H  . sodium chloride  3 mL Intravenous Q12H  . thiamine  100 mg Oral Daily   Continuous Infusions:   Principal Problem:   CHF (congestive heart failure) Active Problems:   Hypertension   Diabetes mellitus without complication   Hyperlipemia   Congestive heart failure   Malnutrition of moderate degree   Right heart failure due to pulmonary hypertension   Pulmonary hypertension   Multiple pulmonary nodules   Acute respiratory failure with hypoxia    Time spent: 35 minutes.     Niel Hummer A  [facility] Pager [phone]. If 7PM-7AM, please contact night-coverage at [email or web address], password [facility] 06/17/2014, 1:42 PM  LOS: 6 days

## 2014-06-18 LAB — BASIC METABOLIC PANEL
ANION GAP: 8 (ref 5–15)
BUN: 38 mg/dL — ABNORMAL HIGH (ref 6–23)
CALCIUM: 10 mg/dL (ref 8.4–10.5)
CO2: 34 mEq/L — ABNORMAL HIGH (ref 19–32)
Chloride: 101 mEq/L (ref 96–112)
Creatinine, Ser: 1.44 mg/dL — ABNORMAL HIGH (ref 0.50–1.10)
GFR calc Af Amer: 37 mL/min — ABNORMAL LOW (ref >90)
GFR calc non Af Amer: 32 mL/min — ABNORMAL LOW (ref >90)
GLUCOSE: 148 mg/dL — AB (ref 70–99)
Potassium: 4.4 mEq/L (ref 3.7–5.3)
Sodium: 143 mEq/L (ref 137–147)

## 2014-06-18 LAB — GLUCOSE, CAPILLARY
GLUCOSE-CAPILLARY: 131 mg/dL — AB (ref 70–99)
Glucose-Capillary: 156 mg/dL — ABNORMAL HIGH (ref 70–99)
Glucose-Capillary: 152 mg/dL — ABNORMAL HIGH (ref 70–99)
Glucose-Capillary: 164 mg/dL — ABNORMAL HIGH (ref 70–99)

## 2014-06-18 LAB — CBC
HCT: 35.9 % — ABNORMAL LOW (ref 36.0–46.0)
HEMOGLOBIN: 11.1 g/dL — AB (ref 12.0–15.0)
MCH: 28.8 pg (ref 26.0–34.0)
MCHC: 30.9 g/dL (ref 30.0–36.0)
MCV: 93 fL (ref 78.0–100.0)
Platelets: 175 10*3/uL (ref 150–400)
RBC: 3.86 MIL/uL — AB (ref 3.87–5.11)
RDW: 13.2 % (ref 11.5–15.5)
WBC: 8 10*3/uL (ref 4.0–10.5)

## 2014-06-18 MED ORDER — POLYETHYLENE GLYCOL 3350 17 G PO PACK
17.0000 g | PACK | Freq: Two times a day (BID) | ORAL | Status: DC
  Administered 2014-06-18 (×2): 17 g via ORAL
  Filled 2014-06-18 (×4): qty 1

## 2014-06-18 NOTE — Progress Notes (Signed)
[facility] PROGRESS NOTE  Melissa Shannon [id number] DOB: 1928/05/25 DOA: 06/11/2014 PCP: No primary care provider on file.  Assessment/Plan:  Hypotension: improved. Continue to  hold bisoprolol. She denies chest pain, no worsening dyspnea. Oxygen sat stable.   Acute Diastolic CHF:  -Chest x ray with interstitial prominence suggesting mild Pneumonitis. BNP elevated at 10,167 -ECHO pulmonary HTN, diastolic dysfunction grade 1.   -Negative 2.7 L.  -cardiology consulted. Appreciate cardiology recommendation -BP improved. Will resume lasix lower dose. Monitor for hypotension. BP stable.   Pulmonary HTN, probably secondary to PE.  -Intermediate probability V-Q scan.  -Radiology recommend CT chest to evaluate for mass.  -CT Chest : 35 x 11 x 10 mm fusiform density is seen in the right middle lobe medially. Also noted is 7 x 3 mm nodule anteriorly in the left lower lobe. If the patient is at high risk for bronchogenic carcinoma, -Doppler negative. Final doppler report was negative for DVT.  -Continue with Lovenox. If renal function improved, will consider transition to eliquis.  -need ECHO in 3 month.  Started  Eliquis, renal function stable.   Multiple Pulmonary nodules: Lung nodule, and fusiform density 35 X 11 mm: Pulmonary consulted.  Appreciate pulmonary recommendation. Need CT contrast scan in 3 month.   Elevated troponin: could be demand ischemia in setting of heart failure. Also patient with KCD.   CKD stage III:  Last Cr per records 2014 at 1.6.  Monitor on lasix.  Hold ACE.  Cr today at 1.5  Hypertension -hold Norvasc, HCTZ, and ACE.  -hold Bisoprolol.  BP soft.   Hyperlipidemia -continue statins   Type 2 Diabetes Uncomplicated -DC metformin due to renal failure.  -SSI.  - A1C 6.9  5. Hypothyroid  -TSH; 0.68  6-Dizziness; orthostatic positive. Hold  bisoprolol.  Feeling better. Need to be careful while doing PT evaluation.    Code Status: Full Code.   Family Communication: Care discussed with patient.  Disposition Plan: remain inpatient. Needs PT assessment.    Consultants:  Cardiology  Pulmonary   Procedures:  ECHO pulmonary HTN, diastolic dysfunction.   Doppler: negative for DVT.    Antibiotics: None  HPI/Subjective: Feeling better today, no dyspnea, no dizziness.   Objective: Filed Vitals:   06/18/14 1307  BP: 104/58  Pulse: 78  Temp: 97.7 F (36.5 C)  Resp: 18    Intake/Output Summary (Last 24 hours) at 06/18/14 1835 Last data filed at 06/18/14 1700  Gross per 24 hour  Intake   1410 ml  Output      0 ml  Net   1410 ml   Filed Weights   06/16/14 0453 06/17/14 0613 06/18/14 0558  Weight: 97.9 kg (215 lb 13.3 oz) 99.4 kg (219 lb 2.2 oz) 99 kg (218 lb 4.1 oz)    Exam:   General:  Alert in no distress.   Cardiovascular: S 1, S 2 RRR  Respiratory: CTA  Abdomen: Bs present, soft, NT  Musculoskeletal: trace edema.    Data Reviewed: Basic Metabolic Panel:  Recent Labs Lab 06/14/14 0405 06/15/14 0530 06/16/14 0418 06/16/14 1410 06/18/14 0420  NA 140 141 140 142 143  K 3.3* 3.6* 3.7 5.3 4.4  CL 100 102 99 100 101  CO2 28 28 31  33* 34*  GLUCOSE 119* 114* 129* 219* 148*  BUN 46* 47* 44* 40* 38*  CREATININE 1.80* 1.76* 1.64* 1.51* 1.44*  CALCIUM 9.6 9.3 9.8 9.6 10.0   Liver Function Tests:  Recent Labs Lab 06/12/14 0421  AST 10  ALT 6  ALKPHOS 61  BILITOT 0.4  PROT 5.9*  ALBUMIN 3.1*   No results for input(s): LIPASE, AMYLASE in the last 168 hours. No results for input(s): AMMONIA in the last 168 hours. CBC:  Recent Labs Lab 06/14/14 0405 06/15/14 0530 06/16/14 0418 06/17/14 0437 06/18/14 0400  WBC 9.1 7.7 8.2 7.3 8.0  HGB 10.7* 11.6* 11.0* 11.1* 11.1*  HCT 33.4* 35.0* 34.4* 35.2* 35.9*  MCV 90.0 89.7 90.8 91.9 93.0  PLT 154 165 154 160 175   Cardiac Enzymes:  Recent Labs Lab 06/11/14 2214 06/12/14 0421 06/12/14 1010  TROPONINI 0.66* 0.61* 0.59*   BNP (last  3 results)  Recent Labs  06/11/14 1814 06/16/14 0418  PROBNP 10167.0* 3540.0*   CBG:  Recent Labs Lab 06/17/14 1655 06/17/14 2131 06/18/14 0747 06/18/14 1301 06/18/14 1654  GLUCAP 163* 154* 131* 156* 152*    No results found for this or any previous visit (from the past 240 hour(s)).   Studies: No results found.  Scheduled Meds: . apixaban  10 mg Oral BID   Followed by  . [START ON 06/24/2014] apixaban  5 mg Oral BID  . aspirin EC  81 mg Oral Daily  . folic acid  1 mg Oral Daily  . furosemide  20 mg Oral Daily  . insulin aspart  0-9 Units Subcutaneous TID WC  . levothyroxine  100 mcg Oral QAC breakfast  . multivitamin with minerals  1 tablet Oral Daily  . polyethylene glycol  17 g Oral BID  . rosuvastatin  20 mg Oral q1800  . sodium chloride  3 mL Intravenous Q12H  . thiamine  100 mg Oral Daily   Continuous Infusions:   Principal Problem:   CHF (congestive heart failure) Active Problems:   Hypertension   Diabetes mellitus without complication   Hyperlipemia   Congestive heart failure   Malnutrition of moderate degree   Right heart failure due to pulmonary hypertension   Pulmonary hypertension   Multiple pulmonary nodules   Acute respiratory failure with hypoxia    Time spent: 35 minutes.     Niel Hummer A  [facility] Pager [phone]. If 7PM-7AM, please contact night-coverage at [email or web address], password [facility] 06/18/2014, 6:35 PM  LOS: 7 days

## 2014-06-18 NOTE — Progress Notes (Signed)
Physical Therapy Treatment Patient Details Name: Melissa Shannon MRN: [id number] DOB: [birth year] Today's Date: 06/18/2014    History of Present Illness PT is an 80 yo female admitted for SOB.  Pt found to have pulmonary HTN an CHF.  VQ scan: Intermediate probability for pulmonary embolus. No mismatched defects are identified, however there is asymmetrically diminished perfusion throughout the right lung. Potential causes of this include hilar obstruction due to mass or lymphadenopathy, pulmonary artery stenosis, as well as proximal pulmonary embolus. Due to the patient's diminished renal function, a noncontrast chest CT is recommended to assess for a central obstructing lesion."     PT Comments    Pt was seen for continued exercise and was ambulatory today with a trip from chair to chair.  Her plan is to go home with HHPT and will definitely need assistance for this to be successful.  Nursing aware she is up to have her assist with her Adl's.  Plan to go to [place] when appropriate.  Follow Up Recommendations  Home health PT     Equipment Recommendations  None recommended by PT    Recommendations for Other Services       Precautions / Restrictions Precautions Precautions: Fall Restrictions Weight Bearing Restrictions: No    Mobility  Bed Mobility Overal bed mobility: Needs Assistance Bed Mobility: Sidelying to Sit   Sidelying to sit: Min assist       General bed mobility comments: increased time, bedrails and HOB elevated  Transfers Overall transfer level: Needs assistance Equipment used: Rolling walker (2 wheeled) Transfers: Sit to/from [facility] Sit to Stand: Min guard Stand pivot transfers: Min assist       General transfer comment: Pt was feeling weak in her legs and needed to control the speed of transition  Ambulation/Gait Ambulation/Gait assistance: Min assist Ambulation Distance (Feet): 5 Feet Assistive device: Rolling walker (2  wheeled) Gait Pattern/deviations: Step-through pattern;Decreased dorsiflexion - right;Decreased dorsiflexion - left;Decreased step length - right;Decreased step length - left;Wide base of support;Trunk flexed Gait velocity: slow Gait velocity interpretation: Below normal speed for age/gender General Gait Details: pt was able to assist with controlling walker but needs extra time and cues to line up safely to sit in chair   Stairs            Wheelchair Mobility    Modified Rankin (Stroke Patients Only)       Balance Overall balance assessment: Needs assistance Sitting-balance support: Feet supported;Single extremity supported Sitting balance-Leahy Scale: Good Sitting balance - Comments: sat to see how her energy would be to stand and walk Postural control: Posterior lean Standing balance support: Bilateral upper extremity supported Standing balance-Leahy Scale: Fair Standing balance comment: walker needed for controlling legs, but can stand still with less fear of LOB                    Cognition Arousal/Alertness: Awake/alert Behavior During Therapy: WFL for tasks assessed/performed Overall Cognitive Status: Within Functional Limits for tasks assessed                      Exercises General Exercises - Lower Extremity Ankle Circles/Pumps: AROM;Both;10 reps Long Arc Quad: Strengthening;Both;10 reps Heel Slides: Strengthening;Both;10 reps Hip ABduction/ADduction: Strengthening;Both;10 reps    General Comments General comments (skin integrity, edema, etc.): Up in chair after getting to [facility] and is tired but controls her effort to conserve energy      Pertinent Vitals/Pain Pain Assessment: No/denies pain  BP  was 101/61, pulse 63 and O2 sat 97%    Home Living                      Prior Function            PT Goals (current goals can now be found in the care plan section) Acute Rehab PT Goals Patient Stated Goal: to get home. Progress  towards PT goals: Progressing toward goals    Frequency  Min 3X/week    PT Plan Current plan remains appropriate    Co-evaluation             End of Session   Activity Tolerance: Patient limited by fatigue Patient left: in chair;with call bell/phone within reach;with chair alarm set     Time: 1119-1155 PT Time Calculation (min): 36 min  Charges:  $Therapeutic Exercise: 8-22 mins $Therapeutic Activity: 8-22 mins                    G Codes:      Ramond Dial Jun 20, 2014, 12:40 PM  Mee Hives, PT MS Acute Rehab Dept. Number: [phone]

## 2014-06-18 NOTE — Progress Notes (Signed)
Patient ID: Melissa Shannon, female   DOB: June 06, 1928, 80 y.o.   MRN: [id number]  Continuing to follow at a distance. Nothing to add today.  Daryel November, MD

## 2014-06-19 DIAGNOSIS — I509 Heart failure, unspecified: Secondary | ICD-10-CM | POA: Insufficient documentation

## 2014-06-19 LAB — GLUCOSE, CAPILLARY: Glucose-Capillary: 135 mg/dL — ABNORMAL HIGH (ref 70–99)

## 2014-06-19 LAB — PRO B NATRIURETIC PEPTIDE: PRO B NATRI PEPTIDE: 1415 pg/mL — AB (ref 0–450)

## 2014-06-19 MED ORDER — ENSURE COMPLETE PO LIQD: 237.0000 mL | Freq: Two times a day (BID) | ORAL | Status: DC | PRN

## 2014-06-19 MED ORDER — APIXABAN 5 MG PO TABS: 10.0000 mg | ORAL_TABLET | Freq: Two times a day (BID) | ORAL | Status: DC

## 2014-06-19 MED ORDER — POLYETHYLENE GLYCOL 3350 17 G PO PACK: 17.0000 g | PACK | Freq: Two times a day (BID) | ORAL | Status: DC

## 2014-06-19 MED ORDER — FUROSEMIDE 40 MG PO TABS: 40.0000 mg | ORAL_TABLET | Freq: Every day | ORAL | Status: DC

## 2014-06-19 MED ORDER — ADULT MULTIVITAMIN W/MINERALS CH: 1.0000 | ORAL_TABLET | Freq: Every day | ORAL | Status: AC

## 2014-06-19 MED ORDER — FUROSEMIDE 40 MG PO TABS
40.0000 mg | ORAL_TABLET | Freq: Every day | ORAL | Status: DC
  Administered 2014-06-19: 40 mg via ORAL
  Filled 2014-06-19: qty 1

## 2014-06-19 MED ORDER — APIXABAN 5 MG PO TABS: 5.0000 mg | ORAL_TABLET | Freq: Two times a day (BID) | ORAL | Status: DC

## 2014-06-19 NOTE — Progress Notes (Signed)
   Subjective: patinet says that she is breathing better than when I saw her Monday  Got up a couple times yesterday  No dizziness.   Objective: Filed Vitals:   06/18/14 1307 06/18/14 2112 06/19/14 0420 06/19/14 0546  BP: 104/58 127/81  139/64  Pulse: 78 72  71  Temp: 97.7 F (36.5 C) 98.2 F (36.8 C)  98.2 F (36.8 C)  TempSrc: Oral Oral  Oral  Resp: 18 18  18   Height:      Weight:   219 lb 3.2 oz (99.428 kg)   SpO2: 96% 100%  96%   Weight change: 15.1 oz (0.428 kg)  Intake/Output Summary (Last 24 hours) at 06/19/14 0713 Last data filed at 06/18/14 1700  Gross per 24 hour  Intake   1080 ml  Output      0 ml  Net   1080 ml    General: Alert, awake, oriented x3, in no acute distress Neck:  JVP increased Heart: Regular rate and rhythm, without murmurs, rubs, gallops.  Lungs: Clear to auscultation.  No rales or wheezes. Exemities:  No edema.   Neuro: Grossly intact, nonfocal.   Lab Results: Results for orders placed or performed during the hospital encounter of 06/11/14 (from the past 24 hour(s))  Glucose, capillary     Status: Abnormal   Collection Time: 06/18/14  7:47 AM  Result Value Ref Range   Glucose-Capillary 131 (H) 70 - 99 mg/dL  Glucose, capillary     Status: Abnormal   Collection Time: 06/18/14  1:01 PM  Result Value Ref Range   Glucose-Capillary 156 (H) 70 - 99 mg/dL  Glucose, capillary     Status: Abnormal   Collection Time: 06/18/14  4:54 PM  Result Value Ref Range   Glucose-Capillary 152 (H) 70 - 99 mg/dL  Glucose, capillary     Status: Abnormal   Collection Time: 06/18/14  9:45 PM  Result Value Ref Range   Glucose-Capillary 164 (H) 70 - 99 mg/dL   Comment 1 Notify RN    Comment 2 Documented in Chart     Studies/Results: No results found.  Medications: Reviewed   @PROBHOSP @  1.  Pulm HTN  Breathing inproved with some diuresis.  Would repeat BNP  Exam is difficult  She prob still with volume increase  Only on 20 Lasix  I would at least  increase to 40 mg.  Follow renal function.  (Actually improved from previous) Pt will need close follow up as outpatient in [place] HTN/CHF clinic I will make sure she has appt.   Continue anticoagulation.    LOS: 8 days   Dorris Carnes 06/19/2014, 7:13 AM

## 2014-06-19 NOTE — Discharge Summary (Signed)
Physician Discharge Summary  Melissa Shannon XBJ:[id number] DOB: 01/25/1928 DOA: 06/11/2014  PCP: No primary care provider on file.  Admit date: 06/11/2014 Discharge date: 06/19/2014  Time spent: 35 minutes  Recommendations for Outpatient Follow-up:  1. Needs B-met to follow renal function, patient on eliquis.  2. Adjust lasix as BP allows it.  3. Needs to follow up with Dr Melvyn Novas, patient will need CT chest in 3 month to follow lung lesion.   Discharge Diagnoses:    Acute diastolic CHF (congestive heart failure)   Pulmonary HTN, probably PE.    Hypertension   Diabetes mellitus without complication   Hyperlipemia   Congestive heart failure   Malnutrition of moderate degree   Right heart failure due to pulmonary hypertension   Pulmonary hypertension   Multiple pulmonary nodules   Acute respiratory failure with hypoxia   Congestive heart disease   Discharge Condition: Stable.   Diet recommendation: Heart Healthy  Filed Weights   06/17/14 3086 06/18/14 0558 06/19/14 0420  Weight: 99.4 kg (219 lb 2.2 oz) 99 kg (218 lb 4.1 oz) 99.428 kg (219 lb 3.2 oz)    History of present illness:  Melissa Shannon is a 80 y.o. female presents with shortness of breath. She states that she has been short of breath since Monday. She has had no chest pain. She has no fevers or chills. No cough or congestion noted. She has chronic edema of her legs. She has no headache dizziness. No prior history of heart failure. She states that she has no cardiac issues in the past. She has no stroke in the past. She states that since she has been in the ED her symptoms have resolved. She is a diabetic and has hypertension.  Hospital Course:   Hypotension: improved. Continue to hold bisoprolol. She denies chest pain, no worsening dyspnea. Oxygen sat stable.   Acute Diastolic CHF:  -Chest x ray with interstitial prominence suggesting mild Pneumonitis. BNP elevated at 10,167 -ECHO pulmonary HTN, diastolic  dysfunction grade 1.  -Negative 2.7 L.  -cardiology consulted. Appreciate cardiology recommendation -BP improved. lasix resume. Monitor for hypotension. BP stable.   Pulmonary HTN, probably secondary to PE.  -Intermediate probability V-Q scan. Dr Melvyn Novas consulted, who agree with anticoagulation.  -Radiology recommend CT chest to evaluate for mass.  -CT Chest : 35 x 11 x 10 mm fusiform density is seen in the right middle lobe medially. Also noted is 7 x 3 mm nodule anteriorly in the left lower lobe. If the patient is at high risk for bronchogenic carcinoma, -Doppler negative. Final doppler report was negative for DVT.  -Patient was initially on Lovenox. After renal function improved, she was transition to eliquis.  -need ECHO in 3 month.  Started Eliquis, renal function stable.   Multiple Pulmonary nodules: Lung nodule, and fusiform density 35 X 11 mm: Pulmonary consulted.  Appreciate pulmonary recommendation. Need CT contrast scan in 3 month.   Elevated troponin: could be demand ischemia in setting of heart failure. Also patient with KCD.   CKD stage III:  Last Cr per records 2014 at 1.6.  Monitor on lasix.  Hold ACE.  Cr today at 1.4  Hypertension -hold Norvasc, HCTZ, and ACE.  -hold Bisoprolol. BP soft.   Hyperlipidemia -continue statins   Type 2 Diabetes Uncomplicated -DC metformin due to renal failure.  -SSI.  - A1C 6.9  5. Hypothyroid  -TSH; 0.68  6-Orthostatic Hypotension: Dizziness; orthostatic positive during this admission. Hold bisoprolol. Feeling better. Resolved after adjusting  BP medications and diuretics.   Procedures:  ECHO; Pulmonary HTN, diastolic dysfunction.   Doppler; Negative for DVT.   Consultations:  Cardiology  Pulmonary  Discharge Exam: Filed Vitals:   06/19/14 0546  BP: 139/64  Pulse: 71  Temp: 98.2 F (36.8 C)  Resp: 18    General: Stable.  Cardiovascular: S 1, S 2 RRR Respiratory: CTA  Discharge  Instructions You were cared for by a hospitalist during your hospital stay. If you have any questions about your discharge medications or the care you received while you were in the hospital after you are discharged, you can call the unit and asked to speak with the hospitalist on call if the hospitalist that took care of you is not available. Once you are discharged, your primary care physician will handle any further medical issues. Please note that NO REFILLS for any discharge medications will be authorized once you are discharged, as it is imperative that you return to your primary care physician (or establish a relationship with a primary care physician if you do not have one) for your aftercare needs so that they can reassess your need for medications and monitor your lab values.  Discharge Instructions    Diet - low sodium heart healthy    Complete by:  As directed      Increase activity slowly    Complete by:  As directed           Current Discharge Medication List    START taking these medications   Details  !! apixaban (ELIQUIS) 5 MG TABS tablet Take 1 tablet (5 mg total) by mouth 2 (two) times daily. Qty: 60 tablet, Refills: 1    !! apixaban (ELIQUIS) 5 MG TABS tablet Take 2 tablets (10 mg total) by mouth 2 (two) times daily. Qty: 8 tablet, Refills: 0    feeding supplement, ENSURE COMPLETE, (ENSURE COMPLETE) LIQD Take 237 mLs by mouth 2 (two) times daily between meals as needed (As requested by patient). Qty: 30 Bottle, Refills: 0    furosemide (LASIX) 40 MG tablet Take 1 tablet (40 mg total) by mouth daily. Qty: 30 tablet, Refills: 0    Multiple Vitamin (MULTIVITAMIN WITH MINERALS) TABS tablet Take 1 tablet by mouth daily. Qty: 30 tablet, Refills: 0    polyethylene glycol (MIRALAX / GLYCOLAX) packet Take 17 g by mouth 2 (two) times daily. Qty: 14 each, Refills: 0     !! - Potential duplicate medications found. Please discuss with provider.    CONTINUE these medications  which have NOT CHANGED   Details  Cholecalciferol (VITAMIN D3) 5000 UNITS CHEW Chew 1 tablet by mouth daily.    levothyroxine (SYNTHROID, LEVOTHROID) 100 MCG tablet Take 100 mcg by mouth daily before breakfast.    rosuvastatin (CRESTOR) 20 MG tablet Take 20 mg by mouth daily.      STOP taking these medications     amLODipine-benazepril (LOTREL) 5-20 MG per capsule      bisoprolol-hydrochlorothiazide (ZIAC) 10-6.25 MG per tablet      metFORMIN (GLUCOPHAGE) 500 MG tablet        Allergies  Allergen Reactions  . Penicillins Other (See Comments)    Throat felt tight   Follow-up Information    Follow up with Loralie Champagne, MD.   Specialty:  Cardiology   Why:  November 9th at 10:50 AM  with heart failure clinic   Contact information:   [address] [place] [address] [phone]  The results of significant diagnostics from this hospitalization (including imaging, microbiology, ancillary and laboratory) are listed below for reference.    Significant Diagnostic Studies: Dg Chest 2 View  06/13/2014   CLINICAL DATA:  Shortness of Breath, hypertension, diabetes  EXAM: CHEST  2 VIEW  COMPARISON:  06/11/2014  FINDINGS: Cardiomediastinal silhouette is stable. No acute infiltrate or pleural effusion. No pulmonary edema. Osteopenia and mild degenerative changes thoracic spine.  IMPRESSION: No active cardiopulmonary disease.   Electronically Signed   By: Lahoma Crocker M.D.   On: 06/13/2014 08:53   Ct Chest Wo Contrast  06/13/2014   CLINICAL DATA:  Shortness of breath.  EXAM: CT CHEST WITHOUT CONTRAST  TECHNIQUE: Multidetector CT imaging of the chest was performed following the standard protocol without IV contrast.  COMPARISON:  Chest radiograph and V/Q scan of same day.  FINDINGS: No pneumothorax or pleural effusion is noted. 7 x 3 mm nodule is seen anteriorly in the left lower lobe best seen on image number 41 of series 5. Fusiform density is seen in the right  middle lobe medially that measures 35 x 11 x 10 mm. No mediastinal mass or adenopathy is noted. Atherosclerotic calcifications of thoracic aorta are noted. No significant abnormality seen in the visualized portion of the upper abdomen  IMPRESSION: 35 x 11 x 10 mm fusiform density is seen in the right middle lobe medially. The possibility of neoplasm or malignancy must be considered, and further evaluation with PET scan is recommended.  Also noted is 7 x 3 mm nodule anteriorly in the left lower lobe. If the patient is at high risk for bronchogenic carcinoma, follow-up chest CT at 3-52month is recommended. If the patient is at low risk for bronchogenic carcinoma, follow-up chest CT at 6-12 months is recommended. This recommendation follows the consensus statement: Guidelines for Management of Small Pulmonary Nodules Detected on CT Scans: A Statement from the [employer] published in Radiology 2005; 237:395-400.   Electronically Signed   By: JSabino DickM.D.   On: 06/13/2014 16:33   Nm Pulmonary Perf And Vent  06/13/2014   CLINICAL DATA:  Dyspnea for 5 days.  EXAM: NUCLEAR MEDICINE VENTILATION - PERFUSION LUNG SCAN  TECHNIQUE: Ventilation images were obtained in multiple projections using inhaled aerosol technetium 99 M DTPA. Perfusion images were obtained in multiple projections after intravenous injection of Tc-925mAA.  RADIOPHARMACEUTICALS:  44.8 mCi Tc-9922mPA aerosol and 5 point to mCi Tc-54m21m  COMPARISON:  Chest radiographs 06/13/2014  FINDINGS: Ventilation: Diffusely heterogeneous tracer, particularly peripherally, suggestive of underlying COPD, with some central airway tracer deposition.  Perfusion: There is a moderate sized matched defect in the superior segment left lower lobe. There is also a moderate sized matched defect in the anterior segment right upper lobe. Perfusion is diffusely diminished throughout the right lung compared to the left.  IMPRESSION: Intermediate probability for  pulmonary embolus. No mismatched defects are identified, however there is asymmetrically diminished perfusion throughout the right lung. Potential causes of this include hilar obstruction due to mass or lymphadenopathy, pulmonary artery stenosis, as well as proximal pulmonary embolus. Due to the patient's diminished renal function, a noncontrast chest CT is recommended to assess for a central obstructing lesion.   Electronically Signed   By: AlleLogan Boresn: 06/13/2014 12:26   Dg Chest Port 1 View  06/11/2014   CLINICAL DATA:  Shortness of breath x2 days.  EXAM: PORTABLE CHEST - 1 VIEW  COMPARISON:  03/11/2010.  FINDINGS:  Mediastinum hilar structures normal. Diffuse mild interstitial prominence noted suggesting mild pneumonitis. Heart size normal. Pulmonary vascularity is normal. No pleural effusion or pneumothorax. No acute osseous abnormality. Degenerative changes both shoulders. Degenerative changes thoracic spine.  IMPRESSION: Diffuse mild interstitial prominence suggesting mild pneumonitis .   Electronically Signed   By: Marcello Moores  Register   On: 06/11/2014 18:04    Microbiology: No results found for this or any previous visit (from the past 240 hour(s)).   Labs: Basic Metabolic Panel:  Recent Labs Lab 06/14/14 0405 06/15/14 0530 06/16/14 0418 06/16/14 1410 06/18/14 0420  NA 140 141 140 142 143  K 3.3* 3.6* 3.7 5.3 4.4  CL 100 102 99 100 101  CO2 _0 33* 34*  GLUCOSE 119* 114* 129* 219* 148*  BUN 46* 47* 44* 40* 38*  CREATININE 1.80* 1.76* 1.64* 1.51* 1.44*  CALCIUM 9.6 9.3 9.8 9.6 10.0   Liver Function Tests: No results for input(s): AST, ALT, ALKPHOS, BILITOT, PROT, ALBUMIN in the last 168 hours. No results for input(s): LIPASE, AMYLASE in the last 168 hours. No results for input(s): AMMONIA in the last 168 hours. CBC:  Recent Labs Lab 06/14/14 0405 06/15/14 0530 06/16/14 0418 06/17/14 0437 06/18/14 0400  WBC 9.1 7.7 8.2 7.3 8.0  HGB 10.7* 11.6* 11.0* 11.1* 11.1*   HCT 33.4* 35.0* 34.4* 35.2* 35.9*  MCV 90.0 89.7 90.8 91.9 93.0  PLT 154 165 154 160 175   Cardiac Enzymes: No results for input(s): CKTOTAL, CKMB, CKMBINDEX, TROPONINI in the last 168 hours. BNP: BNP (last 3 results)  Recent Labs  06/11/14 1814 06/16/14 0418 06/18/14 0400  PROBNP 10167.0* 3540.0* 1415.0*   CBG:  Recent Labs Lab 06/18/14 0747 06/18/14 1301 06/18/14 1654 06/18/14 2145 06/19/14 0739  GLUCAP 131* 156* 152* 164* 135*       Signed:  Regalado, Belkys A  [facility] 06/19/2014, 10:58 AM

## 2014-06-19 NOTE — Progress Notes (Signed)
Physical Therapy Treatment Patient Details Name: Melissa Shannon MRN: [id number] DOB: Aug 09, 1928 Today's Date: 06/19/2014    History of Present Illness PT is an 80 yo female admitted for SOB.  Pt found to have pulmonary HTN an CHF.  VQ scan: Intermediate probability for pulmonary embolus. No mismatched defects are identified, however there is asymmetrically diminished perfusion throughout the right lung. Potential causes of this include hilar obstruction due to mass or lymphadenopathy, pulmonary artery stenosis, as well as proximal pulmonary embolus. Due to the patient's diminished renal function, a noncontrast chest CT is recommended to assess for a central obstructing lesion."     PT Comments    Pt feeling much better.  Tolerated amb a great distance in hallway then practiced stairs.  Assisted back to bed per pt request.   Follow Up Recommendations  Home health PT     Equipment Recommendations  Rolling walker with 5" wheels (does need a RW, hers is old)    Recommendations for Other Services       Precautions / Restrictions Precautions Precautions: Fall Restrictions Weight Bearing Restrictions: No    Mobility  Bed Mobility Overal bed mobility: Needs Assistance Bed Mobility: Supine to Sit;Sit to Supine     Supine to sit: Supervision Sit to supine: Supervision   General bed mobility comments: only required increased time and used rails  Transfers Overall transfer level: Needs assistance Equipment used: Rolling walker (2 wheeled) Transfers: Sit to/from Stand Sit to Stand: Supervision         General transfer comment: pt prefers to lean forward and pull self up on walker with sit to stand.  "This is how I do it".  Good safety cognition.  Ambulation/Gait Ambulation/Gait assistance: Supervision;Min guard Ambulation Distance (Feet): 185 Feet Assistive device: Rolling walker (2 wheeled) Gait Pattern/deviations: Step-through pattern;Trunk flexed;Wide base of support Gait  velocity: WFL   General Gait Details: good safetycognition with slow but functional gait speed.     Stairs Stairs: Yes Stairs assistance: Min guard Stair Management: One rail Right;Step to pattern;Forwards Number of Stairs: 2 General stair comments: pt used one R rail B hands and pulled self up one step at a time.    Wheelchair Mobility    Modified Rankin (Stroke Patients Only)       Balance                                    Cognition                            Exercises      General Comments        Pertinent Vitals/Pain Pain Assessment: No/denies pain    Home Living                      Prior Function            PT Goals (current goals can now be found in the care plan section) Progress towards PT goals: Progressing toward goals    Frequency  Min 3X/week    PT Plan      Co-evaluation             End of Session Equipment Utilized During Treatment: Gait belt Activity Tolerance: Patient tolerated treatment well Patient left: in bed;with call bell/phone within reach;with family/visitor present     Time: [id number] PT Time  Calculation (min): 25 min  Charges:  $Gait Training: 8-22 mins $Therapeutic Activity: 8-22 mins                    G Codes:      Rica Koyanagi  PTA [facility]  Acute  Rehab Pager      [phone]

## 2014-06-19 NOTE — Progress Notes (Signed)
[facility]  [facility] is providing the following services: RW  If patient discharges after hours, please call [phone].   Linward Headland 06/19/2014, 3:30 PM

## 2014-06-19 NOTE — Plan of Care (Signed)
Problem: Phase II Progression Outcomes Goal: Dyspnea controlled with activity Outcome: Completed/Met Date Met:  06/19/14

## 2014-06-19 NOTE — Progress Notes (Signed)
Patient has appt made in CHF Clinic  On Monday November 9th at 10:50 AM  To be with Dr Aundra Dubin or Dr Haroldine Laws.  Clinic in [facility] Vascular center at [facility].

## 2014-06-21 DIAGNOSIS — R911 Solitary pulmonary nodule: Secondary | ICD-10-CM | POA: Diagnosis not present

## 2014-06-21 DIAGNOSIS — R32 Unspecified urinary incontinence: Secondary | ICD-10-CM | POA: Diagnosis not present

## 2014-06-21 DIAGNOSIS — Z7901 Long term (current) use of anticoagulants: Secondary | ICD-10-CM | POA: Diagnosis not present

## 2014-06-21 DIAGNOSIS — E785 Hyperlipidemia, unspecified: Secondary | ICD-10-CM | POA: Diagnosis not present

## 2014-06-21 DIAGNOSIS — I509 Heart failure, unspecified: Secondary | ICD-10-CM | POA: Diagnosis not present

## 2014-06-21 DIAGNOSIS — I272 Other secondary pulmonary hypertension: Secondary | ICD-10-CM | POA: Diagnosis not present

## 2014-06-21 DIAGNOSIS — M1990 Unspecified osteoarthritis, unspecified site: Secondary | ICD-10-CM | POA: Diagnosis not present

## 2014-06-21 DIAGNOSIS — E119 Type 2 diabetes mellitus without complications: Secondary | ICD-10-CM

## 2014-06-21 DIAGNOSIS — E440 Moderate protein-calorie malnutrition: Secondary | ICD-10-CM | POA: Diagnosis not present

## 2014-06-22 DIAGNOSIS — E440 Moderate protein-calorie malnutrition: Secondary | ICD-10-CM | POA: Diagnosis not present

## 2014-06-22 DIAGNOSIS — M1990 Unspecified osteoarthritis, unspecified site: Secondary | ICD-10-CM | POA: Diagnosis not present

## 2014-06-22 DIAGNOSIS — I272 Other secondary pulmonary hypertension: Secondary | ICD-10-CM | POA: Diagnosis not present

## 2014-06-22 DIAGNOSIS — I509 Heart failure, unspecified: Secondary | ICD-10-CM | POA: Diagnosis not present

## 2014-06-22 DIAGNOSIS — E119 Type 2 diabetes mellitus without complications: Secondary | ICD-10-CM | POA: Diagnosis not present

## 2014-06-22 DIAGNOSIS — R911 Solitary pulmonary nodule: Secondary | ICD-10-CM | POA: Diagnosis not present

## 2014-06-23 ENCOUNTER — Encounter (HOSPITAL_COMMUNITY): Payer: Self-pay

## 2014-06-23 ENCOUNTER — Ambulatory Visit (HOSPITAL_COMMUNITY)
Admit: 2014-06-23 | Discharge: 2014-06-23 | Disposition: A | Discharge status: Home / Self Care | Payer: Medicare Other | Source: Ambulatory Visit | Attending: Cardiology | Admitting: Cardiology

## 2014-06-23 VITALS — BP 126/82 | HR 114 | Ht 68.0 in | Wt 217.0 lb

## 2014-06-23 DIAGNOSIS — I2699 Other pulmonary embolism without acute cor pulmonale: Secondary | ICD-10-CM

## 2014-06-23 DIAGNOSIS — I272 Other secondary pulmonary hypertension: Secondary | ICD-10-CM

## 2014-06-23 DIAGNOSIS — E785 Hyperlipidemia, unspecified: Secondary | ICD-10-CM | POA: Diagnosis not present

## 2014-06-23 DIAGNOSIS — I5031 Acute diastolic (congestive) heart failure: Secondary | ICD-10-CM

## 2014-06-23 DIAGNOSIS — J9601 Acute respiratory failure with hypoxia: Secondary | ICD-10-CM | POA: Diagnosis not present

## 2014-06-23 DIAGNOSIS — M1990 Unspecified osteoarthritis, unspecified site: Secondary | ICD-10-CM | POA: Insufficient documentation

## 2014-06-23 DIAGNOSIS — R7989 Other specified abnormal findings of blood chemistry: Secondary | ICD-10-CM | POA: Diagnosis not present

## 2014-06-23 DIAGNOSIS — E039 Hypothyroidism, unspecified: Secondary | ICD-10-CM | POA: Diagnosis not present

## 2014-06-23 DIAGNOSIS — R918 Other nonspecific abnormal finding of lung field: Secondary | ICD-10-CM | POA: Diagnosis not present

## 2014-06-23 DIAGNOSIS — I2729 Other secondary pulmonary hypertension: Secondary | ICD-10-CM

## 2014-06-23 DIAGNOSIS — E119 Type 2 diabetes mellitus without complications: Secondary | ICD-10-CM | POA: Diagnosis not present

## 2014-06-23 DIAGNOSIS — I509 Heart failure, unspecified: Secondary | ICD-10-CM | POA: Diagnosis not present

## 2014-06-23 DIAGNOSIS — N183 Chronic kidney disease, stage 3 (moderate): Secondary | ICD-10-CM | POA: Insufficient documentation

## 2014-06-23 DIAGNOSIS — I129 Hypertensive chronic kidney disease with stage 1 through stage 4 chronic kidney disease, or unspecified chronic kidney disease: Secondary | ICD-10-CM | POA: Insufficient documentation

## 2014-06-23 DIAGNOSIS — I50810 Right heart failure, unspecified: Secondary | ICD-10-CM

## 2014-06-23 NOTE — Progress Notes (Addendum)
Patient ID: MIIA ABURTO, female   DOB: 06-25-1928, 80 y.o.   MRN: [id number] PCP: Dr. Bubba Camp  80 yo with history of HTN, DM, and immobility due to osteoarthritis presents for followup of admission with right heart failure and suspect acute PE.  At baseline, patient is not very active.  She has knee arthritis that is very limiting.  She does not do much walking and uses a walker to get around.  She has been very limited for a few years now.  She was admitted in 10/15 with dyspnea.  The dyspnea began about 2-3 days prior to admission.  She would get short of breath after walking about 20 feet with her walker.  She was noted to be in CHF at admission with troponin elevated to 0.6.  Echo showed normal LV size and systolic function but severely dilated/dysfunctional RV with pulmonary hypertension.  V/Q scan was done and showed intermediate probability for PE.  She was seen by pulmonary and thought to have a PE.  Apixaban was started.  She was diuresed with IV Lasix.    She is breathing much better now than prior to admission.  Legs are much less heavy and swollen.  No dyspnea walking around her house.  No chest pain.  No lightheadedness or syncope.    ECG: NSR, 99 bpm, overall normal  Labs (11/15): K 4.4, creatinine 1.44, HCT 35.9, BNP 10167 => 1415  PMH: 1. HTN 2. Hyperlipidemia 3. Osteoarthritis: Involving knees, uses walker.  4. Type II diabetes 5. H/o CCY 6. CKD stage III 7. Hypothyroidism 8. PE: Admitted 10/15 with dyspnea/CHF.  Echo (10/15) with EF 60-65%, D-shaped interventricular septum, severely dilated RV with moderately decreased RV systolic function, PA systolic pressure 47 mmHg.  V/Q scan (10/15) was intermediate probability for PE. Patient was started on apixaban.  9. Nodules on CT chest 10/15.   FH: No h/o venous thromboembolism, no heart problems that she knows of.   SH: Widow, lives alone, nonsmoker, 3 kids.   ROS: All systems reviewed and negative except as per HPI.    Current Outpatient Prescriptions  Medication Sig Dispense Refill  . [START ON 06/24/2014] apixaban (ELIQUIS) 5 MG TABS tablet Take 1 tablet (5 mg total) by mouth 2 (two) times daily. 60 tablet 1  . Cholecalciferol (VITAMIN D3) 5000 UNITS CHEW Chew 1 tablet by mouth daily.    . feeding supplement, ENSURE COMPLETE, (ENSURE COMPLETE) LIQD Take 237 mLs by mouth 2 (two) times daily between meals as needed (As requested by patient). 30 Bottle 0  . furosemide (LASIX) 40 MG tablet Take 1 tablet (40 mg total) by mouth daily. 30 tablet 0  . levothyroxine (SYNTHROID, LEVOTHROID) 100 MCG tablet Take 100 mcg by mouth daily before breakfast.    . Multiple Vitamin (MULTIVITAMIN WITH MINERALS) TABS tablet Take 1 tablet by mouth daily. 30 tablet 0  . polyethylene glycol (MIRALAX / GLYCOLAX) packet Take 17 g by mouth 2 (two) times daily. 14 each 0  . rosuvastatin (CRESTOR) 20 MG tablet Take 20 mg by mouth daily.     No current facility-administered medications for this encounter.   BP 126/82 mmHg  Pulse 114  Ht 5\' 8"  (1.727 m)  Wt 217 lb (98.431 kg)  BMI 33.00 kg/m2  SpO2 94% General: NAD Neck: No JVD, no thyromegaly or thyroid nodule.  Lungs: Clear to auscultation bilaterally with normal respiratory effort. CV: Nondisplaced PMI.  Heart mildly tachy, regular S1/S2, no S3/S4, no murmur.  Thick lower legs  but no pitting edema.  No carotid bruit.  Normal pedal pulses.  Abdomen: Soft, nontender, no hepatosplenomegaly, no distention.  Skin: Intact without lesions or rashes.  Neurologic: Alert and oriented x 3.  Psych: Normal affect. Extremities: No clubbing or cyanosis.  HEENT: Normal.   Assessment/Plan 1. Right heart failure: Echo on 10/15 showed dilated RV with decreased systolic function.  She is suspected by V/Q scan to have had a PE.  This would explain the acute decompensation.  Symptomatically, she is much improved.  She is not volume overloaded on exam.  - Continue Lasix 40 mg daily.  - Repeat  echo in 1/15 to assess for RV recovery.  If RV remains dysfunctional, would consider right heart cath to assess PA pressure.   2. PE: V/Q scan intermediate probability for PE in 10/15.  Pulmonary saw and suspect that she did indeed have PE.  She is at risk for PE given general immobility.   - Continue apixaban for 6 months.  After that time, would give strong consideration to continuing apixaban at 2.5 mg bid long-term to decrease long-term risk as she will likely remain fairly immobile due to her arthritis.  3. CKD: Will need to follow creatinine over time.  4. Pulmonary nodules: Will need noncontrast chest CT in 1/15 to follow.  5. Elevated troponin: Suspect this was demand ischemia with right heart failure and PE.  6. Followup in 6 wks with BMET.   Loralie Champagne 06/23/2014

## 2014-06-23 NOTE — Addendum Note (Signed)
Encounter addended by: Larey Dresser, MD on: 06/23/2014 11:00 PM<BR>     Documentation filed: Notes Section

## 2014-06-23 NOTE — Patient Instructions (Signed)
Your physician recommends that you schedule a follow-up appointment in: 6 weeks with labs (bmet)  Your physician has requested that you have an echocardiogram. Echocardiography is a painless test that uses sound waves to create images of your heart. It provides your doctor with information about the size and shape of your heart and how well your heart's chambers and valves are working. This procedure takes approximately one hour. There are no restrictions for this procedure.  IN January  You have been referred to Dr Melvyn Novas in Pulmonary

## 2014-06-24 DIAGNOSIS — I509 Heart failure, unspecified: Secondary | ICD-10-CM | POA: Diagnosis not present

## 2014-06-24 DIAGNOSIS — R911 Solitary pulmonary nodule: Secondary | ICD-10-CM | POA: Diagnosis not present

## 2014-06-24 DIAGNOSIS — I272 Other secondary pulmonary hypertension: Secondary | ICD-10-CM | POA: Diagnosis not present

## 2014-06-24 DIAGNOSIS — E440 Moderate protein-calorie malnutrition: Secondary | ICD-10-CM | POA: Diagnosis not present

## 2014-06-24 DIAGNOSIS — E119 Type 2 diabetes mellitus without complications: Secondary | ICD-10-CM | POA: Diagnosis not present

## 2014-06-24 DIAGNOSIS — M1990 Unspecified osteoarthritis, unspecified site: Secondary | ICD-10-CM | POA: Diagnosis not present

## 2014-06-25 DIAGNOSIS — E440 Moderate protein-calorie malnutrition: Secondary | ICD-10-CM | POA: Diagnosis not present

## 2014-06-25 DIAGNOSIS — E119 Type 2 diabetes mellitus without complications: Secondary | ICD-10-CM | POA: Diagnosis not present

## 2014-06-25 DIAGNOSIS — M1990 Unspecified osteoarthritis, unspecified site: Secondary | ICD-10-CM | POA: Diagnosis not present

## 2014-06-25 DIAGNOSIS — I509 Heart failure, unspecified: Secondary | ICD-10-CM | POA: Diagnosis not present

## 2014-06-25 DIAGNOSIS — I272 Other secondary pulmonary hypertension: Secondary | ICD-10-CM | POA: Diagnosis not present

## 2014-06-25 DIAGNOSIS — R911 Solitary pulmonary nodule: Secondary | ICD-10-CM | POA: Diagnosis not present

## 2014-06-26 DIAGNOSIS — I509 Heart failure, unspecified: Secondary | ICD-10-CM | POA: Diagnosis not present

## 2014-06-26 DIAGNOSIS — E119 Type 2 diabetes mellitus without complications: Secondary | ICD-10-CM | POA: Diagnosis not present

## 2014-06-26 DIAGNOSIS — E440 Moderate protein-calorie malnutrition: Secondary | ICD-10-CM | POA: Diagnosis not present

## 2014-06-26 DIAGNOSIS — M1990 Unspecified osteoarthritis, unspecified site: Secondary | ICD-10-CM | POA: Diagnosis not present

## 2014-06-26 DIAGNOSIS — R911 Solitary pulmonary nodule: Secondary | ICD-10-CM | POA: Diagnosis not present

## 2014-06-26 DIAGNOSIS — I272 Other secondary pulmonary hypertension: Secondary | ICD-10-CM | POA: Diagnosis not present

## 2014-06-27 DIAGNOSIS — M1990 Unspecified osteoarthritis, unspecified site: Secondary | ICD-10-CM | POA: Diagnosis not present

## 2014-06-27 DIAGNOSIS — I509 Heart failure, unspecified: Secondary | ICD-10-CM | POA: Diagnosis not present

## 2014-06-27 DIAGNOSIS — E440 Moderate protein-calorie malnutrition: Secondary | ICD-10-CM | POA: Diagnosis not present

## 2014-06-27 DIAGNOSIS — E119 Type 2 diabetes mellitus without complications: Secondary | ICD-10-CM | POA: Diagnosis not present

## 2014-06-27 DIAGNOSIS — I272 Other secondary pulmonary hypertension: Secondary | ICD-10-CM | POA: Diagnosis not present

## 2014-06-27 DIAGNOSIS — R911 Solitary pulmonary nodule: Secondary | ICD-10-CM | POA: Diagnosis not present

## 2014-06-30 ENCOUNTER — Encounter: Payer: Self-pay | Admitting: Internal Medicine

## 2014-06-30 ENCOUNTER — Ambulatory Visit (INDEPENDENT_AMBULATORY_CARE_PROVIDER_SITE_OTHER): Payer: Medicare Other | Admitting: Internal Medicine

## 2014-06-30 VITALS — BP 140/90 | HR 100 | Ht 68.0 in | Wt 220.0 lb

## 2014-06-30 DIAGNOSIS — R918 Other nonspecific abnormal finding of lung field: Secondary | ICD-10-CM

## 2014-06-30 DIAGNOSIS — R911 Solitary pulmonary nodule: Secondary | ICD-10-CM | POA: Diagnosis not present

## 2014-06-30 DIAGNOSIS — E119 Type 2 diabetes mellitus without complications: Secondary | ICD-10-CM | POA: Diagnosis not present

## 2014-06-30 DIAGNOSIS — I272 Other secondary pulmonary hypertension: Secondary | ICD-10-CM | POA: Diagnosis not present

## 2014-06-30 DIAGNOSIS — I2699 Other pulmonary embolism without acute cor pulmonale: Secondary | ICD-10-CM | POA: Diagnosis not present

## 2014-06-30 DIAGNOSIS — I270 Primary pulmonary hypertension: Secondary | ICD-10-CM

## 2014-06-30 DIAGNOSIS — I509 Heart failure, unspecified: Secondary | ICD-10-CM | POA: Diagnosis not present

## 2014-06-30 DIAGNOSIS — E440 Moderate protein-calorie malnutrition: Secondary | ICD-10-CM | POA: Diagnosis not present

## 2014-06-30 DIAGNOSIS — M1990 Unspecified osteoarthritis, unspecified site: Secondary | ICD-10-CM | POA: Diagnosis not present

## 2014-06-30 MED ORDER — APIXABAN 5 MG PO TABS: 5.0000 mg | ORAL_TABLET | Freq: Two times a day (BID) | ORAL | Status: DC

## 2014-06-30 NOTE — Progress Notes (Signed)
Subjective:    Patient ID: Melissa Shannon, female    DOB: 1928/03/29,    MRN: [id number]  HPI  22 yowf never smoker s/p admit  Admit date: 06/11/2014 Discharge date: 06/19/2014     Recommendations for Outpatient Follow-up:  1. Needs B-met to follow renal function, patient on eliquis.  2. Adjust lasix as BP allows it.  3. Needs to follow up with Dr Melvyn Novas, patient will need CT chest in 3 month to follow lung lesion.  Discharge Diagnoses:   Acute diastolic CHF (congestive heart failure)  Pulmonary HTN, probably PE.   Hypertension  Diabetes mellitus without complication  Hyperlipemia  Congestive heart failure  Malnutrition of moderate degree  Right heart failure due to pulmonary hypertension  Pulmonary hypertension  Multiple pulmonary nodules  Acute respiratory failure with hypoxia  Congestive heart disease    06/30/2014 transition of care/ post hosp  F/u ov/Wert re:  Chief Complaint  Patient presents with  . Pulmonary Consult    Referred by Dr. Loralie Champagne for eval of pulmonary nodule. Pt c/o DOE with walking approx 25 ft.    see my inpt note from 06/14/14 with new onset sob/desat assoc with RV strain and intermediate V/Q c/w PE ? Chronicity > she is  Improving back to baseline doe on walker for djd / knees slow her down more than breathing.  No obvious other patterns in day to day or daytime variabilty or assoc chronic cough or cp or chest tightness, subjective wheeze overt sinus or hb symptoms. No unusual exp hx or h/o childhood pna/ asthma or knowledge of premature birth.  Sleeping ok without nocturnal  or early am exacerbation  of respiratory  c/o's or need for noct saba. Also denies any obvious fluctuation of symptoms with weather or environmental changes or other aggravating or alleviating factors except as outlined above   Current Medications, Allergies, Complete Past Medical History, Past Surgical History, Family History, and Social History were  reviewed in [employer] record.           Review of Systems  Constitutional: Positive for unexpected weight change. Negative for fever and chills.  HENT: Positive for dental problem. Negative for congestion, ear pain, nosebleeds, postnasal drip, rhinorrhea, sinus pressure, sneezing, sore throat, trouble swallowing and voice change.   Eyes: Negative for visual disturbance.  Respiratory: Positive for shortness of breath. Negative for cough and choking.   Cardiovascular: Positive for leg swelling. Negative for chest pain.  Gastrointestinal: Negative for vomiting, abdominal pain and diarrhea.  Genitourinary: Negative for difficulty urinating.  Musculoskeletal: Negative for arthralgias.  Skin: Negative for rash.  Neurological: Negative for tremors, syncope and headaches.  Hematological: Does not bruise/bleed easily.       Objective:   Physical Exam  Pleasant but very frail appearing amb wf nad  Wt Readings from Last 3 Encounters:  06/30/14 220 lb (99.791 kg)  06/23/14 217 lb (98.431 kg)  06/19/14 219 lb 3.2 oz (99.428 kg)    Vital signs reviewed  HEENT: nl dentition, turbinates, and orophanx. Nl external ear canals without cough reflex   NECK :  without JVD/Nodes/TM/ nl carotid upstrokes bilaterally   LUNGS: no acc muscle use, clear to A and P bilaterally without cough on insp or exp maneuvers   CV:  RRR  no s3 or murmur or increase in P2, no edema   ABD:  soft and nontender with nl excursion in the supine position. No bruits or organomegaly, bowel sounds nl  MS:  warm without deformities, calf tenderness, cyanosis or clubbing  SKIN: warm and dry without lesions    NEURO:  alert, approp, no deficits    Ct chest 06/13/14 35 x 11 x 10 mm fusiform density is seen in the right middle lobe medially. The possibility of neoplasm or malignancy must be considered, and further evaluation with PET scan is recommended.  Also noted is 7 x 3 mm nodule  anteriorly in the left lower lobe      Assessment & Plan:

## 2014-06-30 NOTE — Patient Instructions (Addendum)
Please schedule a follow up visit in 3 months but call sooner if needed and we will schedule you a CT chest in the meantime

## 2014-07-01 DIAGNOSIS — M1990 Unspecified osteoarthritis, unspecified site: Secondary | ICD-10-CM | POA: Diagnosis not present

## 2014-07-01 DIAGNOSIS — I509 Heart failure, unspecified: Secondary | ICD-10-CM | POA: Diagnosis not present

## 2014-07-01 DIAGNOSIS — E119 Type 2 diabetes mellitus without complications: Secondary | ICD-10-CM | POA: Diagnosis not present

## 2014-07-01 DIAGNOSIS — E440 Moderate protein-calorie malnutrition: Secondary | ICD-10-CM | POA: Diagnosis not present

## 2014-07-01 DIAGNOSIS — I272 Other secondary pulmonary hypertension: Secondary | ICD-10-CM | POA: Diagnosis not present

## 2014-07-01 DIAGNOSIS — R911 Solitary pulmonary nodule: Secondary | ICD-10-CM | POA: Diagnosis not present

## 2014-07-02 DIAGNOSIS — E119 Type 2 diabetes mellitus without complications: Secondary | ICD-10-CM | POA: Diagnosis not present

## 2014-07-02 DIAGNOSIS — E440 Moderate protein-calorie malnutrition: Secondary | ICD-10-CM | POA: Diagnosis not present

## 2014-07-02 DIAGNOSIS — R911 Solitary pulmonary nodule: Secondary | ICD-10-CM | POA: Diagnosis not present

## 2014-07-02 DIAGNOSIS — M1990 Unspecified osteoarthritis, unspecified site: Secondary | ICD-10-CM | POA: Diagnosis not present

## 2014-07-02 DIAGNOSIS — I509 Heart failure, unspecified: Secondary | ICD-10-CM | POA: Diagnosis not present

## 2014-07-02 DIAGNOSIS — I272 Other secondary pulmonary hypertension: Secondary | ICD-10-CM | POA: Diagnosis not present

## 2014-07-02 NOTE — Assessment & Plan Note (Signed)
See V/Q 06/13/14 with neg venous dopplers 06/14/14  - echo 06/13/14 c/w RV strain, ? Acute on chronic  rec continue eliquis for at least 3 months before repeating echo and regrouping at that point re Risk vs benefit

## 2014-07-02 NOTE — Assessment & Plan Note (Signed)
Acute PH secondary to likely multiple PE dx 06/13/14 which are underestimated by v/q. - Rec min of 3 m factor Xa inhibitor adjusted to creat clearance.  - repeat Echo at 3 months then decide risk vs benefit of continued rx given she is quite frail with neg venous dopplers noted 06/14/14

## 2014-07-02 NOTE — Assessment & Plan Note (Signed)
Although there are clearly abnormalities on CT scan, they should probably be considered "microscopic" since not obvious on plain cxr .     In the setting of obvious "macroscopic" health issues,  I am very reluctatnt to embark on an invasive w/u at this point but will arrange consevative  follow up and in the meantime see what we can do to address the patient's subjective concerns.    Repeat CT in 3 months is all that I would rec for now as she is low risk ca and high risk one or both densities are infarcts.

## 2014-07-07 DIAGNOSIS — R911 Solitary pulmonary nodule: Secondary | ICD-10-CM | POA: Diagnosis not present

## 2014-07-07 DIAGNOSIS — I509 Heart failure, unspecified: Secondary | ICD-10-CM | POA: Diagnosis not present

## 2014-07-07 DIAGNOSIS — E440 Moderate protein-calorie malnutrition: Secondary | ICD-10-CM | POA: Diagnosis not present

## 2014-07-07 DIAGNOSIS — E119 Type 2 diabetes mellitus without complications: Secondary | ICD-10-CM | POA: Diagnosis not present

## 2014-07-07 DIAGNOSIS — M1990 Unspecified osteoarthritis, unspecified site: Secondary | ICD-10-CM | POA: Diagnosis not present

## 2014-07-07 DIAGNOSIS — I272 Other secondary pulmonary hypertension: Secondary | ICD-10-CM | POA: Diagnosis not present

## 2014-07-09 DIAGNOSIS — I509 Heart failure, unspecified: Secondary | ICD-10-CM | POA: Diagnosis not present

## 2014-07-09 DIAGNOSIS — M1990 Unspecified osteoarthritis, unspecified site: Secondary | ICD-10-CM | POA: Diagnosis not present

## 2014-07-09 DIAGNOSIS — R911 Solitary pulmonary nodule: Secondary | ICD-10-CM | POA: Diagnosis not present

## 2014-07-09 DIAGNOSIS — E119 Type 2 diabetes mellitus without complications: Secondary | ICD-10-CM | POA: Diagnosis not present

## 2014-07-09 DIAGNOSIS — E440 Moderate protein-calorie malnutrition: Secondary | ICD-10-CM | POA: Diagnosis not present

## 2014-07-09 DIAGNOSIS — I272 Other secondary pulmonary hypertension: Secondary | ICD-10-CM | POA: Diagnosis not present

## 2014-07-13 DIAGNOSIS — E119 Type 2 diabetes mellitus without complications: Secondary | ICD-10-CM | POA: Diagnosis not present

## 2014-07-13 DIAGNOSIS — I509 Heart failure, unspecified: Secondary | ICD-10-CM | POA: Diagnosis not present

## 2014-07-13 DIAGNOSIS — E440 Moderate protein-calorie malnutrition: Secondary | ICD-10-CM | POA: Diagnosis not present

## 2014-07-13 DIAGNOSIS — M1990 Unspecified osteoarthritis, unspecified site: Secondary | ICD-10-CM | POA: Diagnosis not present

## 2014-07-13 DIAGNOSIS — R911 Solitary pulmonary nodule: Secondary | ICD-10-CM | POA: Diagnosis not present

## 2014-07-13 DIAGNOSIS — I272 Other secondary pulmonary hypertension: Secondary | ICD-10-CM | POA: Diagnosis not present

## 2014-07-15 DIAGNOSIS — R911 Solitary pulmonary nodule: Secondary | ICD-10-CM | POA: Diagnosis not present

## 2014-07-15 DIAGNOSIS — E119 Type 2 diabetes mellitus without complications: Secondary | ICD-10-CM | POA: Diagnosis not present

## 2014-07-15 DIAGNOSIS — I509 Heart failure, unspecified: Secondary | ICD-10-CM | POA: Diagnosis not present

## 2014-07-15 DIAGNOSIS — I272 Other secondary pulmonary hypertension: Secondary | ICD-10-CM | POA: Diagnosis not present

## 2014-07-15 DIAGNOSIS — M1990 Unspecified osteoarthritis, unspecified site: Secondary | ICD-10-CM | POA: Diagnosis not present

## 2014-07-15 DIAGNOSIS — E440 Moderate protein-calorie malnutrition: Secondary | ICD-10-CM | POA: Diagnosis not present

## 2014-07-16 DIAGNOSIS — E440 Moderate protein-calorie malnutrition: Secondary | ICD-10-CM | POA: Diagnosis not present

## 2014-07-16 DIAGNOSIS — E119 Type 2 diabetes mellitus without complications: Secondary | ICD-10-CM | POA: Diagnosis not present

## 2014-07-16 DIAGNOSIS — M1990 Unspecified osteoarthritis, unspecified site: Secondary | ICD-10-CM | POA: Diagnosis not present

## 2014-07-16 DIAGNOSIS — I509 Heart failure, unspecified: Secondary | ICD-10-CM | POA: Diagnosis not present

## 2014-07-16 DIAGNOSIS — R911 Solitary pulmonary nodule: Secondary | ICD-10-CM | POA: Diagnosis not present

## 2014-07-16 DIAGNOSIS — I272 Other secondary pulmonary hypertension: Secondary | ICD-10-CM | POA: Diagnosis not present

## 2014-07-20 DIAGNOSIS — I272 Other secondary pulmonary hypertension: Secondary | ICD-10-CM | POA: Diagnosis not present

## 2014-07-20 DIAGNOSIS — I509 Heart failure, unspecified: Secondary | ICD-10-CM | POA: Diagnosis not present

## 2014-07-20 DIAGNOSIS — E119 Type 2 diabetes mellitus without complications: Secondary | ICD-10-CM | POA: Diagnosis not present

## 2014-07-20 DIAGNOSIS — M1990 Unspecified osteoarthritis, unspecified site: Secondary | ICD-10-CM | POA: Diagnosis not present

## 2014-07-20 DIAGNOSIS — R911 Solitary pulmonary nodule: Secondary | ICD-10-CM | POA: Diagnosis not present

## 2014-07-20 DIAGNOSIS — E440 Moderate protein-calorie malnutrition: Secondary | ICD-10-CM | POA: Diagnosis not present

## 2014-07-22 ENCOUNTER — Ambulatory Visit: Payer: Self-pay | Admitting: Internal Medicine

## 2014-07-22 DIAGNOSIS — I509 Heart failure, unspecified: Secondary | ICD-10-CM | POA: Diagnosis not present

## 2014-07-22 DIAGNOSIS — I272 Other secondary pulmonary hypertension: Secondary | ICD-10-CM | POA: Diagnosis not present

## 2014-07-22 DIAGNOSIS — E440 Moderate protein-calorie malnutrition: Secondary | ICD-10-CM | POA: Diagnosis not present

## 2014-07-22 DIAGNOSIS — R911 Solitary pulmonary nodule: Secondary | ICD-10-CM | POA: Diagnosis not present

## 2014-07-22 DIAGNOSIS — E119 Type 2 diabetes mellitus without complications: Secondary | ICD-10-CM | POA: Diagnosis not present

## 2014-07-22 DIAGNOSIS — M1990 Unspecified osteoarthritis, unspecified site: Secondary | ICD-10-CM | POA: Diagnosis not present

## 2014-07-24 ENCOUNTER — Ambulatory Visit (INDEPENDENT_AMBULATORY_CARE_PROVIDER_SITE_OTHER): Payer: Medicare Other | Admitting: Nurse Practitioner

## 2014-07-24 ENCOUNTER — Encounter: Payer: Self-pay | Admitting: Nurse Practitioner

## 2014-07-24 VITALS — BP 118/72 | HR 98 | Temp 97.0°F | Resp 10 | Ht 68.0 in | Wt 217.0 lb

## 2014-07-24 DIAGNOSIS — E785 Hyperlipidemia, unspecified: Secondary | ICD-10-CM | POA: Diagnosis not present

## 2014-07-24 DIAGNOSIS — I10 Essential (primary) hypertension: Secondary | ICD-10-CM | POA: Diagnosis not present

## 2014-07-24 DIAGNOSIS — I2699 Other pulmonary embolism without acute cor pulmonale: Secondary | ICD-10-CM | POA: Diagnosis not present

## 2014-07-24 DIAGNOSIS — I5032 Chronic diastolic (congestive) heart failure: Secondary | ICD-10-CM

## 2014-07-24 DIAGNOSIS — E039 Hypothyroidism, unspecified: Secondary | ICD-10-CM

## 2014-07-24 DIAGNOSIS — E119 Type 2 diabetes mellitus without complications: Secondary | ICD-10-CM | POA: Diagnosis not present

## 2014-07-24 MED ORDER — LEVOTHYROXINE SODIUM 100 MCG PO TABS
100.0000 ug | ORAL_TABLET | Freq: Every day | ORAL | Status: DC
Start: 2014-07-24 — End: 2015-02-09

## 2014-07-24 MED ORDER — APIXABAN 5 MG PO TABS: 5.0000 mg | ORAL_TABLET | Freq: Two times a day (BID) | ORAL | Status: DC

## 2014-07-24 MED ORDER — FUROSEMIDE 40 MG PO TABS: 40.0000 mg | ORAL_TABLET | Freq: Every day | ORAL | Status: DC

## 2014-07-24 NOTE — Progress Notes (Signed)
Patient ID: Melissa Shannon, female   DOB: Feb 26, 1928, 80 y.o.   MRN: [id number]    PCP: Lauree Chandler, NP  Allergies  Allergen Reactions  . Penicillins Other (See Comments)    Throat felt tight    Chief Complaint  Patient presents with  . Establish Care    New patient establish care: Discuss metformin, medication was d/c'ed in the hospital-? if needs medications for DM      HPI: Patient is a 80 y.o. female seen in the office today to establish care. Previously with Dr Osborne Casco ([facility])  but wanting to change. recently in the hospital and they referred her here.   Was diagnosed with PE and CHF during hospitalization. Following with Cardiologist next week and Pulmonary in Jan. Has echo scheduled  No shortness of breath or swelling at this time.   OA of the knees- sees Dr Rush Farmer occasionally however does not have pain. Does report stiffness of joints  Pt previously on metformin prior to hospitalization but was stopped while she was in there. Does not check blood sugars when she was home.     Review of Systems:  Review of Systems  Constitutional: Negative for activity change, appetite change, fatigue and unexpected weight change.  HENT: Negative for congestion and hearing loss.   Eyes: Negative.   Respiratory: Negative for cough and shortness of breath.   Cardiovascular: Positive for leg swelling. Negative for chest pain and palpitations.  Gastrointestinal: Negative for abdominal pain, diarrhea and constipation.  Genitourinary: Negative for dysuria and difficulty urinating.       Incontinence of bladder  Musculoskeletal: Positive for gait problem. Negative for myalgias and arthralgias.       Joint stiffness  Skin: Negative for color change and wound.  Neurological: Positive for weakness. Negative for dizziness.  Psychiatric/Behavioral: Positive for sleep disturbance. Negative for behavioral problems, confusion and agitation.    Past Medical History   Diagnosis Date  . Hypertension   . Diabetes mellitus without complication   . Arthritis   . Hyperlipidemia   . Urinary incontinence   . Hypothyroidism    Past Surgical History  Procedure Laterality Date  . Cholecystectomy    . Tonsillectomy and adenoidectomy  1937    Dr.Brewer  . Pilonidal cyst excision  [facility]      bilateral cataracts  . Right elbow  1999    x 2, still has one piece of metal in it  . Bladder surgery  2000    Dr.Tannerbaum (Bladder Tact)  . Cataract extraction  2014    Dr.Shapiro   Social History:   reports that she has never smoked. She has never used smokeless tobacco. She reports that she does not drink alcohol or use illicit drugs.  Family History  Problem Relation Age of Onset  . Suicidality Father   . Cancer Brother     Medications: Patient's Medications  New Prescriptions   No medications on file  Previous Medications   APIXABAN (ELIQUIS) 5 MG TABS TABLET    Take 1 tablet (5 mg total) by mouth 2 (two) times daily.   CHOLECALCIFEROL (VITAMIN D3) 5000 UNITS CHEW    Chew 1 tablet by mouth daily.   FEEDING SUPPLEMENT, ENSURE COMPLETE, (ENSURE COMPLETE) LIQD    Take 237 mLs by mouth 2 (two) times daily between meals as needed (As requested by patient).   FUROSEMIDE (LASIX) 40 MG TABLET    Take  1 tablet (40 mg total) by mouth daily.   LEVOTHYROXINE (SYNTHROID, LEVOTHROID) 100 MCG TABLET    Take 100 mcg by mouth daily before breakfast. For Hypothyroidism   MULTIPLE VITAMIN (MULTIVITAMIN WITH MINERALS) TABS TABLET    Take 1 tablet by mouth daily.   POLYETHYLENE GLYCOL (MIRALAX / GLYCOLAX) PACKET    Take 17 g by mouth as needed.   ROSUVASTATIN (CRESTOR) 20 MG TABLET    Take 20 mg by mouth daily. For High Cholesterol  Modified Medications   No medications on file  Discontinued Medications   No medications on file     Physical Exam:  Filed Vitals:   07/24/14 1326  BP: 118/72  Pulse: 98  Temp: 97 F (36.1 C)    TempSrc: Oral  Resp: 10  Height: 5\' 8"  (1.727 m)  Weight: 217 lb (98.431 kg)  SpO2: 95%    Physical Exam  Constitutional: She is oriented to person, place, and time. She appears well-developed and well-nourished. No distress.  HENT:  Head: Normocephalic and atraumatic.  Mouth/Throat: Oropharynx is clear and moist. No oropharyngeal exudate.  Eyes: Conjunctivae are normal. Pupils are equal, round, and reactive to light.  Neck: Normal range of motion. Neck supple.  Cardiovascular: Normal rate, regular rhythm and normal heart sounds.   Pulmonary/Chest: Effort normal and breath sounds normal.  Abdominal: Soft. Bowel sounds are normal.  Musculoskeletal: She exhibits no edema.  Neurological: She is alert and oriented to person, place, and time.  Skin: Skin is warm and dry. She is not diaphoretic.  Psychiatric: She has a normal mood and affect.    Labs reviewed: Basic Metabolic Panel:  Recent Labs  06/11/14 2214  06/16/14 0418 06/16/14 1410 06/18/14 0420  NA  --   < > 140 142 143  K  --   < > 3.7 5.3 4.4  CL  --   < > 99 100 101  CO2  --   < > 31 33* 34*  GLUCOSE  --   < > 129* 219* 148*  BUN  --   < > 44* 40* 38*  CREATININE 1.67*  < > 1.64* 1.51* 1.44*  CALCIUM  --   < > 9.8 9.6 10.0  TSH 0.681  --   --   --   --   < > = values in this interval not displayed. Liver Function Tests:  Recent Labs  06/12/14 0421  AST 10  ALT 6  ALKPHOS 61  BILITOT 0.4  PROT 5.9*  ALBUMIN 3.1*   No results for input(s): LIPASE, AMYLASE in the last 8760 hours. No results for input(s): AMMONIA in the last 8760 hours. CBC:  Recent Labs  06/16/14 0418 06/17/14 0437 06/18/14 0400  WBC 8.2 7.3 8.0  HGB 11.0* 11.1* 11.1*  HCT 34.4* 35.2* 35.9*  MCV 90.8 91.9 93.0  PLT 154 160 175   Lipid Panel: No results for input(s): CHOL, HDL, LDLCALC, TRIG, CHOLHDL, LDLDIRECT in the last 8760 hours. TSH:  Recent Labs  06/11/14 2214  TSH 0.681   A1C: Lab Results  Component Value Date    HGBA1C 6.9* 06/11/2014     Assessment/Plan 1. Chronic diastolic congestive heart failure -CHF appers to be stable at this time, no edema, shortness of breath or JVD.  Will follow up labs before next appt  - furosemide (LASIX) 40 MG tablet; Take 1 tablet (40 mg total) by mouth daily.  Dispense: 90 tablet; Refill: 1  2. Pulmonary embolus -following with cardiology and  pulmonary, conts on eliquis - apixaban (ELIQUIS) 5 MG TABS tablet; Take 1 tablet (5 mg total) by mouth 2 (two) times daily.  Dispense: 60 tablet; Refill: 3 - CBC With differential/Platelet; Future  3. Essential hypertension Only on lasix at this time  4. Diabetes mellitus without complication -off all medications at this time, will follow up labs before next visit  - Hemoglobin A1C; Future  5. Hyperlipemia -conts on crestor - Comprehensive metabolic panel; Future - Lipid panel; Future  6. Hypothyroidism, unspecified hypothyroidism type - levothyroxine (SYNTHROID, LEVOTHROID) 100 MCG tablet; Take 1 tablet (100 mcg total) by mouth daily before breakfast. For Hypothyroidism  Dispense: 90 tablet; Refill: 1 -TSH stable in October   Will follow up in 4 weeks with EV with MMSE and fasting blood work prior to visit

## 2014-07-24 NOTE — Patient Instructions (Signed)
Follow up in 1 month for EV with fasting blood work prior to visit

## 2014-07-25 DIAGNOSIS — Z961 Presence of intraocular lens: Secondary | ICD-10-CM | POA: Diagnosis not present

## 2014-07-25 DIAGNOSIS — E119 Type 2 diabetes mellitus without complications: Secondary | ICD-10-CM | POA: Diagnosis not present

## 2014-07-25 LAB — HM DIABETES EYE EXAM

## 2014-07-28 DIAGNOSIS — I272 Other secondary pulmonary hypertension: Secondary | ICD-10-CM | POA: Diagnosis not present

## 2014-07-28 DIAGNOSIS — I509 Heart failure, unspecified: Secondary | ICD-10-CM | POA: Diagnosis not present

## 2014-07-28 DIAGNOSIS — R911 Solitary pulmonary nodule: Secondary | ICD-10-CM | POA: Diagnosis not present

## 2014-07-28 DIAGNOSIS — M1990 Unspecified osteoarthritis, unspecified site: Secondary | ICD-10-CM | POA: Diagnosis not present

## 2014-07-28 DIAGNOSIS — E119 Type 2 diabetes mellitus without complications: Secondary | ICD-10-CM | POA: Diagnosis not present

## 2014-07-28 DIAGNOSIS — E440 Moderate protein-calorie malnutrition: Secondary | ICD-10-CM | POA: Diagnosis not present

## 2014-07-30 DIAGNOSIS — M1990 Unspecified osteoarthritis, unspecified site: Secondary | ICD-10-CM | POA: Diagnosis not present

## 2014-07-30 DIAGNOSIS — I509 Heart failure, unspecified: Secondary | ICD-10-CM | POA: Diagnosis not present

## 2014-07-30 DIAGNOSIS — E440 Moderate protein-calorie malnutrition: Secondary | ICD-10-CM | POA: Diagnosis not present

## 2014-07-30 DIAGNOSIS — E119 Type 2 diabetes mellitus without complications: Secondary | ICD-10-CM | POA: Diagnosis not present

## 2014-07-30 DIAGNOSIS — I272 Other secondary pulmonary hypertension: Secondary | ICD-10-CM | POA: Diagnosis not present

## 2014-07-30 DIAGNOSIS — R911 Solitary pulmonary nodule: Secondary | ICD-10-CM | POA: Diagnosis not present

## 2014-07-31 ENCOUNTER — Ambulatory Visit (HOSPITAL_COMMUNITY)
Admission: RE | Admit: 2014-07-31 | Discharge: 2014-07-31 | Disposition: A | Discharge status: Home / Self Care | Payer: Medicare Other | Source: Ambulatory Visit | Attending: Cardiology | Admitting: Cardiology

## 2014-07-31 VITALS — BP 128/74 | HR 111 | Wt 215.1 lb

## 2014-07-31 DIAGNOSIS — E039 Hypothyroidism, unspecified: Secondary | ICD-10-CM | POA: Diagnosis not present

## 2014-07-31 DIAGNOSIS — Z79899 Other long term (current) drug therapy: Secondary | ICD-10-CM | POA: Diagnosis not present

## 2014-07-31 DIAGNOSIS — I272 Other secondary pulmonary hypertension: Secondary | ICD-10-CM

## 2014-07-31 DIAGNOSIS — I2699 Other pulmonary embolism without acute cor pulmonale: Secondary | ICD-10-CM

## 2014-07-31 DIAGNOSIS — R7989 Other specified abnormal findings of blood chemistry: Secondary | ICD-10-CM | POA: Diagnosis not present

## 2014-07-31 DIAGNOSIS — E119 Type 2 diabetes mellitus without complications: Secondary | ICD-10-CM | POA: Insufficient documentation

## 2014-07-31 DIAGNOSIS — Z7409 Other reduced mobility: Secondary | ICD-10-CM | POA: Insufficient documentation

## 2014-07-31 DIAGNOSIS — I5032 Chronic diastolic (congestive) heart failure: Secondary | ICD-10-CM | POA: Diagnosis not present

## 2014-07-31 DIAGNOSIS — R9430 Abnormal result of cardiovascular function study, unspecified: Secondary | ICD-10-CM | POA: Insufficient documentation

## 2014-07-31 DIAGNOSIS — Z7901 Long term (current) use of anticoagulants: Secondary | ICD-10-CM | POA: Insufficient documentation

## 2014-07-31 DIAGNOSIS — N183 Chronic kidney disease, stage 3 (moderate): Secondary | ICD-10-CM | POA: Insufficient documentation

## 2014-07-31 DIAGNOSIS — M170 Bilateral primary osteoarthritis of knee: Secondary | ICD-10-CM | POA: Insufficient documentation

## 2014-07-31 DIAGNOSIS — I129 Hypertensive chronic kidney disease with stage 1 through stage 4 chronic kidney disease, or unspecified chronic kidney disease: Secondary | ICD-10-CM | POA: Diagnosis not present

## 2014-07-31 DIAGNOSIS — E785 Hyperlipidemia, unspecified: Secondary | ICD-10-CM | POA: Diagnosis not present

## 2014-07-31 DIAGNOSIS — I509 Heart failure, unspecified: Secondary | ICD-10-CM | POA: Insufficient documentation

## 2014-07-31 DIAGNOSIS — I50810 Right heart failure, unspecified: Secondary | ICD-10-CM

## 2014-07-31 DIAGNOSIS — I2729 Other secondary pulmonary hypertension: Secondary | ICD-10-CM

## 2014-07-31 LAB — BASIC METABOLIC PANEL
ANION GAP: 14 (ref 5–15)
BUN: 32 mg/dL — ABNORMAL HIGH (ref 6–23)
CHLORIDE: 98 meq/L (ref 96–112)
CO2: 27 mEq/L (ref 19–32)
Calcium: 10.8 mg/dL — ABNORMAL HIGH (ref 8.4–10.5)
Creatinine, Ser: 1.37 mg/dL — ABNORMAL HIGH (ref 0.50–1.10)
GFR calc non Af Amer: 34 mL/min — ABNORMAL LOW (ref >90)
GFR, EST AFRICAN AMERICAN: 40 mL/min — AB (ref >90)
Glucose, Bld: 193 mg/dL — ABNORMAL HIGH (ref 70–99)
Potassium: 4.2 mEq/L (ref 3.7–5.3)
Sodium: 139 mEq/L (ref 137–147)

## 2014-07-31 MED ORDER — FUROSEMIDE 40 MG PO TABS: 40.0000 mg | ORAL_TABLET | Freq: Every day | ORAL | Status: DC

## 2014-07-31 NOTE — Progress Notes (Signed)
Patient ID: Melissa Shannon, female   DOB: January 17, 1928, 80 y.o.   MRN: [id number] PCP: Dr. Bubba Camp  80 yo with history of HTN, DM, and immobility due to osteoarthritis presents for followup of admission with right heart failure and suspect acute PE.  At baseline, patient is not very active.  She has knee arthritis that is very limiting.  She does not do much walking and uses a walker to get around.  She has been very limited for a few years now.  She was admitted in 10/15 with dyspnea.  The dyspnea began about 2-3 days prior to admission.  She would get short of breath after walking about 20 feet with her walker.  She was noted to be in CHF at admission with troponin elevated to 0.6.  Echo showed normal LV size and systolic function but severely dilated/dysfunctional RV with pulmonary hypertension.  V/Q scan was done and showed intermediate probability for PE.  She was seen by pulmonary and thought to have a PE.  Apixaban was started.  She was diuresed with IV Lasix.    She returns for follow up. Overall feeling good. Weight at home 209-201. Denies SOB/PND/Orthopnea. Taking all medications. Difficulty walking due to arthritis.Tries to follow low salt diet.  Ambulates with a walker. She has an aide 3 days a week.  [facility] following.   ECG: Sinus Tach 111  Labs (06/18/14): K 4.4, creatinine 1.44, HCT 35.9, BNP 10167 => 1415  PMH: 1. HTN 2. Hyperlipidemia 3. Osteoarthritis: Involving knees, uses walker.  4. Type II diabetes 5. H/o CCY 6. CKD stage III 7. Hypothyroidism 8. PE: Admitted 10/15 with dyspnea/CHF.  Echo (10/15) with EF 60-65%, D-shaped interventricular septum, severely dilated RV with moderately decreased RV systolic function, PA systolic pressure 47 mmHg.  V/Q scan (10/15) was intermediate probability for PE. Patient was started on apixaban.  9. Nodules on CT chest 10/15.   FH: No h/o venous thromboembolism, no heart problems that she knows of.   SH: Widow, lives alone, nonsmoker, 3 kids.    ROS: All systems reviewed and negative except as per HPI.   Current Outpatient Prescriptions  Medication Sig Dispense Refill  . apixaban (ELIQUIS) 5 MG TABS tablet Take 1 tablet (5 mg total) by mouth 2 (two) times daily. 60 tablet 3  . Cholecalciferol (VITAMIN D3) 5000 UNITS CHEW Chew 1 tablet by mouth daily.    . feeding supplement, ENSURE COMPLETE, (ENSURE COMPLETE) LIQD Take 237 mLs by mouth 2 (two) times daily between meals as needed (As requested by patient). (Patient taking differently: Take 237 mLs by mouth daily. ) 30 Bottle 0  . furosemide (LASIX) 40 MG tablet Take 1 tablet (40 mg total) by mouth daily. 90 tablet 1  . levothyroxine (SYNTHROID, LEVOTHROID) 100 MCG tablet Take 1 tablet (100 mcg total) by mouth daily before breakfast. For Hypothyroidism 90 tablet 1  . Multiple Vitamin (MULTIVITAMIN WITH MINERALS) TABS tablet Take 1 tablet by mouth daily. 30 tablet 0  . polyethylene glycol (MIRALAX / GLYCOLAX) packet Take 17 g by mouth as needed.    . rosuvastatin (CRESTOR) 20 MG tablet Take 20 mg by mouth daily. For High Cholesterol     No current facility-administered medications for this encounter.   BP 128/74 mmHg  Pulse 111  Wt 215 lb 1.9 oz (97.578 kg)  SpO2 97% General: NAD Neck: No JVD, no thyromegaly or thyroid nodule.  Lungs: Clear to auscultation bilaterally with normal respiratory effort. CV: Nondisplaced PMI.  Heart mildly  tachy, regular S1/S2, no S3/S4, no murmur.  Thick lower legs but no pitting edema.  No carotid bruit.  Normal pedal pulses.  Abdomen: Soft, nontender, no hepatosplenomegaly, no distention.  Skin: Intact without lesions or rashes.  Neurologic: Alert and oriented x 3.  Psych: Normal affect. Extremities: No clubbing or cyanosis.  HEENT: Normal.  EKG  Assessment/Plan 1. Right heart failure: Echo on 10/15 showed dilated RV with decreased systolic function.  She is suspected by V/Q scan to have had a PE.    - Continue Lasix 40 mg daily. Instructed to  take an additonal 40 mg lasix for weight 214 or greater.  - Repeat echo in 08/25/14 to assess for RV recovery.  If RV remains dysfunctional, would consider right heart cath to assess PA pressure.   2. PE: V/Q scan intermediate probability for PE in 10/15.  Pulmonary saw and suspect that she did indeed have PE.  She is at risk for PE given general immobility.   - Continue apixaban for 6 months.  After that time, would give strong consideration to continuing apixaban at 2.5 mg bid long-term to decrease long-term risk as she will likely remain fairly immobile due to her arthritis.  3. CKD: Will need to follow creatinine over time.  4. Pulmonary nodules: Will need noncontrast chest CT in 1/15 to follow.  5. Elevated troponin: Suspect this was demand ischemia with right heart failure and PE.    Check BMET. Follow up in 1 month with Dr Aundra Dubin.   Jaimeson Gopal 07/31/2014

## 2014-07-31 NOTE — Patient Instructions (Signed)
CONTINUE Lasix 40 mg daily however you may take additional 40 mg of Lasix for weight greater than 214  Labs today  Your physician recommends that you schedule a follow-up appointment in: 4 weeks with Dr. Aundra Dubin  Do the following things EVERYDAY: 1) Weigh yourself in the morning before breakfast. Write it down and keep it in a log. 2) Take your medicines as prescribed 3) Eat low salt foods-Limit salt (sodium) to 2000 mg per day.  4) Stay as active as you can everyday 5) Limit all fluids for the day to less than 2 liters 6)

## 2014-08-15 DIAGNOSIS — I272 Other secondary pulmonary hypertension: Secondary | ICD-10-CM | POA: Diagnosis not present

## 2014-08-15 DIAGNOSIS — E119 Type 2 diabetes mellitus without complications: Secondary | ICD-10-CM | POA: Diagnosis not present

## 2014-08-15 DIAGNOSIS — E440 Moderate protein-calorie malnutrition: Secondary | ICD-10-CM | POA: Diagnosis not present

## 2014-08-15 DIAGNOSIS — I509 Heart failure, unspecified: Secondary | ICD-10-CM | POA: Diagnosis not present

## 2014-08-15 DIAGNOSIS — R911 Solitary pulmonary nodule: Secondary | ICD-10-CM | POA: Diagnosis not present

## 2014-08-15 DIAGNOSIS — M1990 Unspecified osteoarthritis, unspecified site: Secondary | ICD-10-CM | POA: Diagnosis not present

## 2014-08-25 ENCOUNTER — Ambulatory Visit (HOSPITAL_COMMUNITY)
Admission: RE | Admit: 2014-08-25 | Discharge: 2014-08-25 | Disposition: A | Discharge status: Home / Self Care | Payer: Medicare Other | Source: Ambulatory Visit | Attending: Cardiology | Admitting: Cardiology

## 2014-08-25 DIAGNOSIS — I50810 Right heart failure, unspecified: Secondary | ICD-10-CM

## 2014-08-25 DIAGNOSIS — I369 Nonrheumatic tricuspid valve disorder, unspecified: Secondary | ICD-10-CM

## 2014-08-25 DIAGNOSIS — I2729 Other secondary pulmonary hypertension: Secondary | ICD-10-CM

## 2014-08-25 DIAGNOSIS — I509 Heart failure, unspecified: Secondary | ICD-10-CM | POA: Insufficient documentation

## 2014-08-25 NOTE — Progress Notes (Signed)
  Echocardiogram 2D Echocardiogram has been performed.  Melissa Shannon 08/25/2014, 9:57 AM

## 2014-09-01 ENCOUNTER — Other Ambulatory Visit: Payer: Medicare Other

## 2014-09-01 DIAGNOSIS — I2699 Other pulmonary embolism without acute cor pulmonale: Secondary | ICD-10-CM | POA: Diagnosis not present

## 2014-09-01 DIAGNOSIS — E119 Type 2 diabetes mellitus without complications: Secondary | ICD-10-CM

## 2014-09-01 DIAGNOSIS — E785 Hyperlipidemia, unspecified: Secondary | ICD-10-CM | POA: Diagnosis not present

## 2014-09-02 LAB — CBC WITH DIFFERENTIAL
BASOS: 1 %
Basophils Absolute: 0.1 10*3/uL (ref 0.0–0.2)
Eos: 4 %
Eosinophils Absolute: 0.3 10*3/uL (ref 0.0–0.4)
HCT: 40.7 % (ref 34.0–46.6)
HEMOGLOBIN: 12.7 g/dL (ref 11.1–15.9)
Immature Grans (Abs): 0 10*3/uL (ref 0.0–0.1)
Immature Granulocytes: 0 %
Lymphocytes Absolute: 2.9 10*3/uL (ref 0.7–3.1)
Lymphs: 32 %
MCH: 28.4 pg (ref 26.6–33.0)
MCHC: 31.2 g/dL — ABNORMAL LOW (ref 31.5–35.7)
MCV: 91 fL (ref 79–97)
MONOS ABS: 0.7 10*3/uL (ref 0.1–0.9)
Monocytes: 8 %
Neutrophils Absolute: 5 10*3/uL (ref 1.4–7.0)
Neutrophils Relative %: 55 %
PLATELETS: 248 10*3/uL (ref 150–379)
RBC: 4.47 x10E6/uL (ref 3.77–5.28)
RDW: 12.8 % (ref 12.3–15.4)
WBC: 8.9 10*3/uL (ref 3.4–10.8)

## 2014-09-02 LAB — COMPREHENSIVE METABOLIC PANEL
ALT: 16 IU/L (ref 0–32)
AST: 16 IU/L (ref 0–40)
Albumin/Globulin Ratio: 1.9 (ref 1.1–2.5)
Albumin: 4 g/dL (ref 3.5–4.7)
Alkaline Phosphatase: 82 IU/L (ref 39–117)
BILIRUBIN TOTAL: 0.5 mg/dL (ref 0.0–1.2)
BUN/Creatinine Ratio: 22 (ref 11–26)
BUN: 27 mg/dL (ref 8–27)
CALCIUM: 10.5 mg/dL — AB (ref 8.7–10.3)
CO2: 29 mmol/L (ref 18–29)
CREATININE: 1.23 mg/dL — AB (ref 0.57–1.00)
Chloride: 98 mmol/L (ref 97–108)
GFR calc non Af Amer: 40 mL/min/{1.73_m2} — ABNORMAL LOW (ref >59)
GFR, EST AFRICAN AMERICAN: 47 mL/min/{1.73_m2} — AB (ref >59)
GLUCOSE: 192 mg/dL — AB (ref 65–99)
Globulin, Total: 2.1 g/dL (ref 1.5–4.5)
Potassium: 4.2 mmol/L (ref 3.5–5.2)
SODIUM: 141 mmol/L (ref 134–144)
Total Protein: 6.1 g/dL (ref 6.0–8.5)

## 2014-09-02 LAB — LIPID PANEL
Chol/HDL Ratio: 2.8 ratio units (ref 0.0–4.4)
Cholesterol, Total: 173 mg/dL (ref 100–199)
HDL: 61 mg/dL (ref >39)
LDL Calculated: 66 mg/dL (ref 0–99)
Triglycerides: 232 mg/dL — ABNORMAL HIGH (ref 0–149)
VLDL Cholesterol Cal: 46 mg/dL — ABNORMAL HIGH (ref 5–40)

## 2014-09-02 LAB — HEMOGLOBIN A1C
Est. average glucose Bld gHb Est-mCnc: 174 mg/dL
HEMOGLOBIN A1C: 7.7 % — AB (ref 4.8–5.6)

## 2014-09-04 ENCOUNTER — Ambulatory Visit (INDEPENDENT_AMBULATORY_CARE_PROVIDER_SITE_OTHER): Payer: Medicare Other | Admitting: Nurse Practitioner

## 2014-09-04 ENCOUNTER — Encounter: Payer: Self-pay | Admitting: Nurse Practitioner

## 2014-09-04 ENCOUNTER — Other Ambulatory Visit: Payer: Self-pay | Admitting: Internal Medicine

## 2014-09-04 VITALS — BP 142/78 | HR 86 | Temp 98.2°F | Resp 10 | Ht 68.0 in | Wt 216.0 lb

## 2014-09-04 DIAGNOSIS — I509 Heart failure, unspecified: Secondary | ICD-10-CM | POA: Diagnosis not present

## 2014-09-04 DIAGNOSIS — I50810 Right heart failure, unspecified: Secondary | ICD-10-CM

## 2014-09-04 DIAGNOSIS — I272 Other secondary pulmonary hypertension: Secondary | ICD-10-CM | POA: Diagnosis not present

## 2014-09-04 DIAGNOSIS — Z139 Encounter for screening, unspecified: Secondary | ICD-10-CM | POA: Diagnosis not present

## 2014-09-04 DIAGNOSIS — Z0000 Encounter for general adult medical examination without abnormal findings: Secondary | ICD-10-CM | POA: Diagnosis not present

## 2014-09-04 DIAGNOSIS — E785 Hyperlipidemia, unspecified: Secondary | ICD-10-CM | POA: Diagnosis not present

## 2014-09-04 DIAGNOSIS — E119 Type 2 diabetes mellitus without complications: Secondary | ICD-10-CM | POA: Diagnosis not present

## 2014-09-04 DIAGNOSIS — R918 Other nonspecific abnormal finding of lung field: Secondary | ICD-10-CM

## 2014-09-04 DIAGNOSIS — Z1231 Encounter for screening mammogram for malignant neoplasm of breast: Secondary | ICD-10-CM | POA: Diagnosis not present

## 2014-09-04 DIAGNOSIS — I2729 Other secondary pulmonary hypertension: Secondary | ICD-10-CM

## 2014-09-04 MED ORDER — LINAGLIPTIN 5 MG PO TABS: 5.0000 mg | ORAL_TABLET | Freq: Every day | ORAL | Status: DC

## 2014-09-04 MED ORDER — AMBULATORY NON FORMULARY MEDICATION: Status: DC

## 2014-09-04 NOTE — Progress Notes (Signed)
Pass clock drawing 

## 2014-09-04 NOTE — Progress Notes (Signed)
Patient ID: Melissa Shannon, female   DOB: 02/03/1928, 81 y.o.   MRN: [id number]    PCP: Lauree Chandler, NP  Allergies  Allergen Reactions  . Penicillins Other (See Comments)    Throat felt tight    Chief Complaint  Patient presents with  . Annual Exam    Yearly check-up, EKG done 08/25/14 ( copy of labs printed inside)  . Medication Management    Discuss the need for diabetic medicine, medication was removed in hospital   . MMSE    30/30 passed clock drawing     HPI: Patient is a 81 y.o. female seen in the office today for EV.  MMSE of 30/30. Lives alone, but has a caregiver that helps her with going to the store, changes linens and does laundry, cooks. needs minimal assistance bathing. Does her own finances  Diet- does not follow diet, trying to cut down on sodium, eats limited fried foods. Likes sweets  Exercise- leg exercise, twice daily for only 5 minutes  Dentist- does not go Eye exam- Dr Delia Heady - diabetic eye exam in 07/2014 Mammogram- has not had one in years. Does not think she would treat cancer Colonoscopy- 2011 Nonsmoker Does not drink ETOH     Review of Systems:  Review of Systems  Constitutional: Negative for activity change, appetite change, fatigue and unexpected weight change.  HENT: Negative for congestion and hearing loss.   Eyes: Negative.   Respiratory: Negative for cough and shortness of breath.   Cardiovascular: Positive for leg swelling. Negative for chest pain and palpitations.  Gastrointestinal: Negative for abdominal pain, diarrhea and constipation.  Genitourinary: Negative for dysuria and difficulty urinating.       Incontinence of bladder  Musculoskeletal: Positive for gait problem. Negative for myalgias and arthralgias.       Joint stiffness  Skin: Negative for color change and wound.  Neurological: Positive for weakness. Negative for dizziness.  Psychiatric/Behavioral: Positive for sleep disturbance. Negative for behavioral problems,  confusion and agitation.    Past Medical History  Diagnosis Date  . Hypertension   . Diabetes mellitus without complication   . Hyperlipidemia   . Urinary incontinence   . Hypothyroidism   . Arthritis     bilateral knees  . Pulmonary embolism   . Edema   . CHF (congestive heart failure)    Past Surgical History  Procedure Laterality Date  . Cholecystectomy    . Tonsillectomy and adenoidectomy  1937    Dr.Brewer  . Pilonidal cyst excision  [place] surgery      bilateral cataracts  . Right elbow  1999    x 2, still has one piece of metal in it  . Bladder surgery  2000    Dr.Tannerbaum (Bladder Tact)  . Cataract extraction  2014    Dr.Shapiro   Social History:   reports that she has never smoked. She has never used smokeless tobacco. She reports that she does not drink alcohol or use illicit drugs.  Family History  Problem Relation Age of Onset  . Suicidality Father   . Cancer Brother     Medications: Patient's Medications  New Prescriptions   No medications on file  Previous Medications   APIXABAN (ELIQUIS) 5 MG TABS TABLET    Take 1 tablet (5 mg total) by mouth 2 (two) times daily.   CHOLECALCIFEROL (VITAMIN D3) 5000 UNITS CHEW    Chew 1 tablet by mouth daily.  FEEDING SUPPLEMENT, ENSURE COMPLETE, (ENSURE COMPLETE) LIQD    Take 237 mLs by mouth 2 (two) times daily between meals as needed (As requested by patient).   FUROSEMIDE (LASIX) 40 MG TABLET    Take 1 tablet (40 mg total) by mouth daily. May take addiitonal 40 mg in the PM for weight 214 lbs. Or greater   LEVOTHYROXINE (SYNTHROID, LEVOTHROID) 100 MCG TABLET    Take 1 tablet (100 mcg total) by mouth daily before breakfast. For Hypothyroidism   MULTIPLE VITAMIN (MULTIVITAMIN WITH MINERALS) TABS TABLET    Take 1 tablet by mouth daily.   POLYETHYLENE GLYCOL (MIRALAX / GLYCOLAX) PACKET    Take 17 g by mouth as needed.   ROSUVASTATIN (CRESTOR) 20 MG TABLET    Take 20 mg by mouth daily. For  High Cholesterol  Modified Medications   No medications on file  Discontinued Medications   No medications on file     Physical Exam:  Filed Vitals:   09/04/14 1116  BP: 142/78  Pulse: 86  Temp: 98.2 F (36.8 C)  TempSrc: Oral  Resp: 10  Height: 5\' 8"  (1.727 m)  Weight: 216 lb (97.977 kg)  SpO2: 97%    Physical Exam  Constitutional: She is oriented to person, place, and time. She appears well-developed and well-nourished. No distress.  HENT:  Head: Normocephalic and atraumatic.  Mouth/Throat: Oropharynx is clear and moist. No oropharyngeal exudate.  Eyes: Conjunctivae are normal. Pupils are equal, round, and reactive to light.  Neck: Normal range of motion. Neck supple.  Cardiovascular: Normal rate, regular rhythm and normal heart sounds.   Pulmonary/Chest: Effort normal and breath sounds normal.  Abdominal: Soft. Bowel sounds are normal.  Musculoskeletal: She exhibits no edema.  Neurological: She is alert and oriented to person, place, and time.  Skin: Skin is warm and dry. She is not diaphoretic.  Psychiatric: She has a normal mood and affect.    Labs reviewed: Basic Metabolic Panel:  Recent Labs  06/11/14 2214  06/18/14 0420 07/31/14 1009 09/01/14 0855  NA  --   < > 143 139 141  K  --   < > 4.4 4.2 4.2  CL  --   < > 101 98 98  CO2  --   < > 34* 27 29  GLUCOSE  --   < > 148* 193* 192*  BUN  --   < > 38* 32* 27  CREATININE 1.67*  < > 1.44* 1.37* 1.23*  CALCIUM  --   < > 10.0 10.8* 10.5*  TSH 0.681  --   --   --   --   < > = values in this interval not displayed. Liver Function Tests:  Recent Labs  06/12/14 0421 09/01/14 0855  AST 10 16  ALT 6 16  ALKPHOS 61 82  BILITOT 0.4 0.5  PROT 5.9* 6.1  ALBUMIN 3.1*  --    No results for input(s): LIPASE, AMYLASE in the last 8760 hours. No results for input(s): AMMONIA in the last 8760 hours. CBC:  Recent Labs  06/17/14 0437 06/18/14 0400 09/01/14 0855  WBC 7.3 8.0 8.9  NEUTROABS  --   --  5.0    HGB 11.1* 11.1* 12.7  HCT 35.2* 35.9* 40.7  MCV 91.9 93.0 91  PLT 160 175 248   Lipid Panel:  Recent Labs  09/01/14 0855  HDL 61  LDLCALC 66  TRIG 232*  CHOLHDL 2.8   TSH:  Recent Labs  06/11/14 2214  TSH  0.681   A1C: Lab Results  Component Value Date   HGBA1C 7.7* 09/01/2014     Assessment/Plan  1. Diabetes mellitus without complication -discussed dietary modifications and exercise goal of 30 mins 5/days a week -will start on tradjenta 5 mg daily for better glycemic control, A1c worse at 7.7 -will take blood sugar every other day, fasting and bring to visit with Tye Maryland, Pharm D in 2 weeks,  - Hemoglobin A1c; Future - Microalbumin, urine   2. Right heart failure due to pulmonary hypertension Remains stable, conts on lasix 40 mg daily   3. Screening - [facility] DEXA SCAN  4. Visit for screening mammogram -pt unsure if she wish to get mammogram, will order and she can decide if she wants to go  - MM [place]; Future  5. Hyperlipemia -triglycerides worse, most likely due to worsening blood sugars. Discussed diet and exercise modifications -will start tradjenta to help blood sugar control and follow up lipids prior to next visit  - Comprehensive metabolic panel; Future - Lipid panel; Future  6. Preventative Health -PREVENTIVE COUNSELING:  The patient was counseled regarding the appropriate use of alcohol, regular self-examination of the breasts on a monthly basis, prevention of dental and periodontal disease, diet, regular sustained exercise for at least 30 minutes 5 times per week, outine screening interval for mammogram as recommended by the [place] and ACOG, importance of regular PAP smears, and recommended schedule for GI hemoccult testing, colonoscopy, cholesterol, thyroid and diabetes screening. Pt aware she needs to see dentist, education given.  Ordered dexa scan and mammogram today  Follow up in 4 weeks with Michaela Corner D  and 3 months with Dr Eulas Post for routine follow up

## 2014-09-04 NOTE — Patient Instructions (Addendum)
Will start you on tradjenta 5 mg daily with biggest meal Take blood sugar every other day and follow up with Cathey, Pharm D in 4 weeks   Will order mammogram and bone density   Follow up with Dr Eulas Post in 3 months with fasting blood work prior to visit

## 2014-09-05 LAB — MICROALBUMIN, URINE: Microalbumin, Urine: 36.7 ug/mL — ABNORMAL HIGH (ref 0.0–17.0)

## 2014-09-09 ENCOUNTER — Other Ambulatory Visit: Payer: Self-pay | Admitting: *Deleted

## 2014-09-09 DIAGNOSIS — I10 Essential (primary) hypertension: Secondary | ICD-10-CM

## 2014-09-09 MED ORDER — LISINOPRIL 10 MG PO TABS: 10.0000 mg | ORAL_TABLET | Freq: Every day | ORAL | Status: DC

## 2014-09-11 ENCOUNTER — Ambulatory Visit (INDEPENDENT_AMBULATORY_CARE_PROVIDER_SITE_OTHER)
Admission: RE | Admit: 2014-09-11 | Discharge: 2014-09-11 | Disposition: A | Discharge status: Home / Self Care | Payer: Medicare Other | Source: Ambulatory Visit | Attending: Internal Medicine | Admitting: Internal Medicine

## 2014-09-11 DIAGNOSIS — R918 Other nonspecific abnormal finding of lung field: Secondary | ICD-10-CM

## 2014-09-12 ENCOUNTER — Ambulatory Visit (HOSPITAL_COMMUNITY)
Admission: RE | Admit: 2014-09-12 | Discharge: 2014-09-12 | Disposition: A | Discharge status: Home / Self Care | Payer: Medicare Other | Source: Ambulatory Visit | Attending: Adult Health | Admitting: Adult Health

## 2014-09-12 ENCOUNTER — Encounter (HOSPITAL_COMMUNITY): Payer: Self-pay

## 2014-09-12 VITALS — BP 118/56 | HR 99 | Wt 216.4 lb

## 2014-09-12 DIAGNOSIS — I509 Heart failure, unspecified: Secondary | ICD-10-CM | POA: Insufficient documentation

## 2014-09-12 DIAGNOSIS — N189 Chronic kidney disease, unspecified: Secondary | ICD-10-CM | POA: Diagnosis not present

## 2014-09-12 DIAGNOSIS — I2699 Other pulmonary embolism without acute cor pulmonale: Secondary | ICD-10-CM

## 2014-09-12 DIAGNOSIS — I50810 Right heart failure, unspecified: Secondary | ICD-10-CM

## 2014-09-12 DIAGNOSIS — I2729 Other secondary pulmonary hypertension: Secondary | ICD-10-CM

## 2014-09-12 DIAGNOSIS — I272 Other secondary pulmonary hypertension: Secondary | ICD-10-CM | POA: Insufficient documentation

## 2014-09-12 DIAGNOSIS — R911 Solitary pulmonary nodule: Secondary | ICD-10-CM | POA: Diagnosis not present

## 2014-09-12 NOTE — Progress Notes (Signed)
Quick Note:  Spoke with pt and notified of results per Dr. Wert. Pt verbalized understanding and denied any questions.  ______ 

## 2014-09-12 NOTE — Patient Instructions (Signed)
Your physician has requested that you have an echocardiogram. Echocardiography is a painless test that uses sound waves to create images of your heart. It provides your doctor with information about the size and shape of your heart and how well your heart's chambers and valves are working. This procedure takes approximately one hour. There are no restrictions for this procedure.  Your physician recommends that you schedule a follow-up appointment in: 4 weeks with echocardiogram

## 2014-09-12 NOTE — Progress Notes (Signed)
Patient ID: Melissa Shannon, female   DOB: [birth year], 81 y.o.   MRN: [id number] PCP: Dr. Bubba Camp  81 yo with history of HTN, DM, and immobility due to osteoarthritis presents for followup of admission with right heart failure and suspect acute PE.  At baseline, patient is not very active.  She has knee arthritis that is very limiting.  She does not do much walking and uses a walker to get around.  She has been very limited for a few years now.  She was admitted in 10/15 with dyspnea.  The dyspnea began about 2-3 days prior to admission.  She would get short of breath after walking about 20 feet with her walker.  She was noted to be in CHF at admission with troponin elevated to 0.6.  Echo showed normal LV size and systolic function but severely dilated/dysfunctional RV with pulmonary hypertension.  V/Q scan was done and showed intermediate probability for PE.  She was seen by pulmonary and thought to have a PE.  Apixaban was started.  She was diuresed with IV Lasix.    She returns for follow up. Mild dyspnea with exertion but this is her baseline. . Denies PND/Orthopnea. Weight at home 209 pounds. Taking all medications. Ambulates with a walker. No bleeding problems.   Labs (06/18/14): K 4.4, creatinine 1.44, HCT 35.9, BNP 10167 => 1415 Labs 09/01/2014 K 4.2 Creatinine 1.23   PMH: 1. HTN 2. Hyperlipidemia 3. Osteoarthritis: Involving knees, uses walker.  4. Type II diabetes 5. H/o CCY 6. CKD stage III 7. Hypothyroidism 8. PE: Admitted 10/15 with dyspnea/CHF.  Echo (10/15) with EF 60-65%, D-shaped interventricular septum, severely dilated RV with moderately decreased RV systolic function, PA systolic pressure 47 mmHg.  V/Q scan (10/15) was intermediate probability for PE. Patient was started on apixaban.  9. Nodules on CT chest 10/15.   FH: No h/o venous thromboembolism, no heart problems that she knows of.   SH: Widow, lives alone, nonsmoker, 3 kids.   ROS: All systems reviewed and negative except  as per HPI.   Current Outpatient Prescriptions  Medication Sig Dispense Refill  . AMBULATORY NON FORMULARY MEDICATION One touch mini lancets and strips, use every other day to test blood sugar Dx: E11.9 30 each 3  . apixaban (ELIQUIS) 5 MG TABS tablet Take 1 tablet (5 mg total) by mouth 2 (two) times daily. 60 tablet 3  . Cholecalciferol (VITAMIN D3) 5000 UNITS CHEW Chew 1 tablet by mouth daily.    . feeding supplement, ENSURE COMPLETE, (ENSURE COMPLETE) LIQD Take 237 mLs by mouth 2 (two) times daily between meals as needed (As requested by patient). (Patient taking differently: Take 237 mLs by mouth daily. ) 30 Bottle 0  . furosemide (LASIX) 40 MG tablet Take 1 tablet (40 mg total) by mouth daily. May take addiitonal 40 mg in the PM for weight 214 lbs. Or greater 120 tablet 1  . levothyroxine (SYNTHROID, LEVOTHROID) 100 MCG tablet Take 1 tablet (100 mcg total) by mouth daily before breakfast. For Hypothyroidism 90 tablet 1  . linagliptin (TRADJENTA) 5 MG TABS tablet Take 1 tablet (5 mg total) by mouth daily. 30 tablet 3  . lisinopril (PRINIVIL,ZESTRIL) 10 MG tablet Take 1 tablet (10 mg total) by mouth daily. 30 tablet 3  . Multiple Vitamin (MULTIVITAMIN WITH MINERALS) TABS tablet Take 1 tablet by mouth daily. 30 tablet 0  . polyethylene glycol (MIRALAX / GLYCOLAX) packet Take 17 g by mouth as needed.    . rosuvastatin (  CRESTOR) 20 MG tablet Take 20 mg by mouth daily. For High Cholesterol     No current facility-administered medications for this encounter.   BP 118/56 mmHg  Pulse 99  Wt 216 lb 6.4 oz (98.158 kg)  SpO2 94% General: NAD Neck: No JVD, no thyromegaly or thyroid nodule.  Lungs: Clear to auscultation bilaterally with normal respiratory effort. CV: Nondisplaced PMI.  Heart mildly tachy, regular S1/S2, no S3/S4, no murmur.  Thick lower legs but no pitting edema.  No carotid bruit.  Normal pedal pulses.  Abdomen: Soft, nontender, no hepatosplenomegaly, no distention.  Skin: Intact  without lesions or rashes.  Neurologic: Alert and oriented x 3.  Psych: Normal affect. Extremities: No clubbing or cyanosis.  HEENT: Normal.     Assessment/Plan 1. Right heart failure: Echo on 10/15 showed dilated RV with decreased systolic function.  V/Q scan with suspected PE. NYHAI   - Continue Lasix 40 mg daily. Instructed to take an additonal 40 mg lasix for weight 214 or greater.  - Repeat echo next visit to assess for RV recovery.  If RV remains dysfunctional, would consider right heart cath to assess PA pressure.   2. PE: V/Q scan intermediate probability for PE in 10/15.  Pulmonary saw and suspect that she did indeed have PE.  She is at risk for PE given general immobility.No bleeding problems. Continue apixaban for 6 months (until April).  After that time, would give strong consideration to continuing apixaban at 2.5 mg bid long-term to decrease long-term risk as she will likely remain fairly immobile due to her arthritis.  3. CKD: Reviewed renal function from 09/01/2014. 4. Pulmonary nodules: Had CT chest yesterday.   Follow up 4 weeks with an ECHO . If RV remains down will need RHC.   Lilya Smitherman NP-C  09/12/2014

## 2014-09-18 ENCOUNTER — Ambulatory Visit
Admission: RE | Admit: 2014-09-18 | Discharge: 2014-09-18 | Disposition: A | Discharge status: Home / Self Care | Payer: Medicare Other | Source: Ambulatory Visit | Attending: Nurse Practitioner | Admitting: Nurse Practitioner

## 2014-09-18 DIAGNOSIS — Z1231 Encounter for screening mammogram for malignant neoplasm of breast: Secondary | ICD-10-CM | POA: Diagnosis not present

## 2014-09-22 ENCOUNTER — Ambulatory Visit (INDEPENDENT_AMBULATORY_CARE_PROVIDER_SITE_OTHER): Payer: Medicare Other | Admitting: Internal Medicine

## 2014-09-22 ENCOUNTER — Encounter: Payer: Self-pay | Admitting: Internal Medicine

## 2014-09-22 ENCOUNTER — Ambulatory Visit (INDEPENDENT_AMBULATORY_CARE_PROVIDER_SITE_OTHER)
Admission: RE | Admit: 2014-09-22 | Discharge: 2014-09-22 | Disposition: A | Discharge status: Home / Self Care | Payer: Medicare Other | Source: Ambulatory Visit | Attending: Internal Medicine | Admitting: Internal Medicine

## 2014-09-22 VITALS — BP 110/60 | HR 90 | Ht 68.0 in | Wt 218.0 lb

## 2014-09-22 DIAGNOSIS — I2699 Other pulmonary embolism without acute cor pulmonale: Secondary | ICD-10-CM

## 2014-09-22 DIAGNOSIS — R918 Other nonspecific abnormal finding of lung field: Secondary | ICD-10-CM

## 2014-09-22 DIAGNOSIS — I10 Essential (primary) hypertension: Secondary | ICD-10-CM | POA: Diagnosis not present

## 2014-09-22 NOTE — Progress Notes (Signed)
Subjective:    Patient ID: Melissa Shannon, female    DOB: 03/25/1928,    MRN: [id number]    Brief patient profile:  66 yowf never smoker s/p admit  Admit date: 06/11/2014 Discharge date: 06/19/2014   Discharge Diagnoses:   Acute diastolic CHF (congestive heart failure)  Pulmonary HTN, probably PE.   Hypertension  Diabetes mellitus without complication  Hyperlipemia  Congestive heart failure  Malnutrition of moderate degree  Right heart failure due to pulmonary hypertension  Pulmonary hypertension  Multiple pulmonary nodules  Acute respiratory failure with hypoxia  Congestive heart disease    06/30/2014 transition of care/ post hosp  F/u ov/Melissa Shannon re:  Chief Complaint  Patient presents with  . Pulmonary Consult    Referred by Dr. Loralie Champagne for eval of pulmonary nodule. Pt c/o DOE with walking approx 25 ft.   see my inpt note from 06/14/14 with new onset sob/desat assoc with RV strain and intermediate V/Q c/w PE ? Chronicity > she is  Improving back to baseline doe on walker for djd / knees slow her down more than breathing rec F/u CT chest in 3 m > no change c/w mucus plugs    09/22/2014 f/u ov/Melissa Shannon re: f/u abn ct chest /presumed PE with PH s/p 3 m anticoagulation  Chief Complaint  Patient presents with  . Follow-up    Pt states her breathing is doing well overall. No new co's today.    pt not enthusiastic for more studies/no cough or limiting sob  No obvious day to day or daytime variabilty or assoc   cp or chest tightness, subjective wheeze overt sinus or hb symptoms. No unusual exp hx or h/o childhood pna/ asthma or knowledge of premature birth.  Sleeping ok without nocturnal  or early am exacerbation  of respiratory  c/o's or need for noct saba. Also denies any obvious fluctuation of symptoms with weather or environmental changes or other aggravating or alleviating factors except as outlined above   Current Medications, Allergies, Complete Past  Medical History, Past Surgical History, Family History, and Social History were reviewed in [employer] record.  ROS  The following are not active complaints unless bolded sore throat, dysphagia, dental problems, itching, sneezing,  nasal congestion or excess/ purulent secretions, ear ache,   fever, chills, sweats, unintended wt loss, pleuritic or exertional cp, hemoptysis,  orthopnea pnd or leg swelling, presyncope, palpitations, heartburn, abdominal pain, anorexia, nausea, vomiting, diarrhea  or change in bowel or urinary habits, change in stools or urine, dysuria,hematuria,  rash, arthralgias, visual complaints, headache, numbness weakness or ataxia or problems with walking or coordination,  change in mood/affect or memory.                      Objective:   Physical Exam  Pleasant  amb wf nad walking with rolling walker   09/22/2014          219  Wt Readings from Last 3 Encounters:  06/30/14 220 lb (99.791 kg)  06/23/14 217 lb (98.431 kg)  06/19/14 219 lb 3.2 oz (99.428 kg)    Vital signs reviewed  HEENT: nl dentition, turbinates, and orophanx. Nl external ear canals without cough reflex   NECK :  without JVD/Nodes/TM/ nl carotid upstrokes bilaterally   LUNGS: no acc muscle use, clear to A and P bilaterally without cough on insp or exp maneuvers   CV:  RRR  no s3 or murmur or increase in P2, no  edema   ABD:  soft and nontender with nl excursion in the supine position. No bruits or organomegaly, bowel sounds nl  MS:  warm without deformities, calf tenderness, cyanosis or clubbing  SKIN: warm and dry without lesions           CXR PA and Lateral:   09/22/2014 :     I personally reviewed images and agree with radiology impression as follows:    Mediastinum and hilar structures are normal. Lungs are clear. No focal pulmonary infiltrate or focal pulmonary lesion identified. No pleural effusion or pneumothorax. Reference made to prior chest CT of  09/11/2014. Stable sclerotic densities left proximal humerus, no change.      Assessment & Plan:

## 2014-09-22 NOTE — Patient Instructions (Addendum)
Please remember to go to the  x-ray department downstairs for your tests - we will call you with the results when they are available.  Please schedule a follow up visit in 3 months but call sooner if needed Add needs cxr / v/q and venous dopplers on return

## 2014-09-22 NOTE — Progress Notes (Signed)
Quick Note:  Spoke with pt and notified of results per Dr. Wert. Pt verbalized understanding and denied any questions.  ______ 

## 2014-09-23 ENCOUNTER — Encounter: Payer: Self-pay | Admitting: Internal Medicine

## 2014-09-23 NOTE — Assessment & Plan Note (Addendum)
Never smoker - See CT chest no contrast 06/13/14 ? Infarcts?  - CT 09/11/14 1. Fusiform lesion in the right middle lobe is oriented with the bronchial tree. Finding could represent chronic mucous plugging. The lesion is slightly thickened compared to prior which warrants continued follow-up with CT or FDG PET-CT. 2. Stable branching nodular pattern in the inferior left lower lobe appears benign and may also represent mucous plugging. >cxr 09/22/14 no obvious abnormality    I had an extended discussion with the patient reviewing all relevant studies completed to date and  lasting 15 to 20 minutes of a 25 minute visit on the following ongoing concerns:   Herbaseline cxr and repeat cxr 09/22/14 are both nl and she does not wish for additional studies.    I think this likely to be a completely benign problem that is very low risk for malignancy and likely just mucus plugs but hard to prove it.  Discussed in detail all the  indications, usual  risks and alternatives  relative to the benefits with patient who agrees to proceed with conservative f/u in 3 m

## 2014-09-23 NOTE — Assessment & Plan Note (Addendum)
-   See V/Q 06/13/14  - echo 06/13/14 c/w RV strain, ? Acute on chronic  - Venous dopplers 06/14/14 neg both lower ext but tds  - echo 08/25/14 repeat Echo >  PAS down to 81  Main risk for recurent clots is inactivity >would probably do v/q and repeat venous dopplers at 3 more months rx then repeat risk/ benefit assessment  Discussed in detail all the  indications, usual  risks and alternatives  relative to the benefits with patient who agrees to proceed with continue eliquis another 3 months as tolerating well

## 2014-10-10 ENCOUNTER — Other Ambulatory Visit: Payer: Medicare Other

## 2014-10-10 DIAGNOSIS — E119 Type 2 diabetes mellitus without complications: Secondary | ICD-10-CM | POA: Diagnosis not present

## 2014-10-10 DIAGNOSIS — E785 Hyperlipidemia, unspecified: Secondary | ICD-10-CM

## 2014-10-10 DIAGNOSIS — I10 Essential (primary) hypertension: Secondary | ICD-10-CM

## 2014-10-11 LAB — HEMOGLOBIN A1C
Est. average glucose Bld gHb Est-mCnc: 194 mg/dL
Hgb A1c MFr Bld: 8.4 % — ABNORMAL HIGH (ref 4.8–5.6)

## 2014-10-11 LAB — COMPREHENSIVE METABOLIC PANEL
ALT: 14 IU/L (ref 0–32)
AST: 11 IU/L (ref 0–40)
Albumin/Globulin Ratio: 1.5 (ref 1.1–2.5)
Albumin: 3.9 g/dL (ref 3.5–4.7)
Alkaline Phosphatase: 89 IU/L (ref 39–117)
BUN/Creatinine Ratio: 23 (ref 11–26)
BUN: 37 mg/dL — AB (ref 8–27)
Bilirubin Total: 0.4 mg/dL (ref 0.0–1.2)
CO2: 25 mmol/L (ref 18–29)
CREATININE: 1.62 mg/dL — AB (ref 0.57–1.00)
Calcium: 10.5 mg/dL — ABNORMAL HIGH (ref 8.7–10.3)
Chloride: 98 mmol/L (ref 97–108)
GFR calc Af Amer: 33 mL/min/{1.73_m2} — ABNORMAL LOW (ref >59)
GFR calc non Af Amer: 29 mL/min/{1.73_m2} — ABNORMAL LOW (ref >59)
GLOBULIN, TOTAL: 2.6 g/dL (ref 1.5–4.5)
GLUCOSE: 201 mg/dL — AB (ref 65–99)
Potassium: 4.3 mmol/L (ref 3.5–5.2)
Sodium: 140 mmol/L (ref 134–144)
TOTAL PROTEIN: 6.5 g/dL (ref 6.0–8.5)

## 2014-10-11 LAB — LIPID PANEL
Chol/HDL Ratio: 2.7 ratio units (ref 0.0–4.4)
Cholesterol, Total: 178 mg/dL (ref 100–199)
HDL: 66 mg/dL (ref >39)
LDL Calculated: 72 mg/dL (ref 0–99)
Triglycerides: 198 mg/dL — ABNORMAL HIGH (ref 0–149)
VLDL CHOLESTEROL CAL: 40 mg/dL (ref 5–40)

## 2014-10-13 ENCOUNTER — Encounter: Payer: Self-pay | Admitting: Nurse Practitioner

## 2014-10-13 ENCOUNTER — Other Ambulatory Visit: Payer: Self-pay

## 2014-10-13 MED ORDER — LINAGLIPTIN 5 MG PO TABS: 5.0000 mg | ORAL_TABLET | Freq: Every day | ORAL | Status: DC

## 2014-10-16 ENCOUNTER — Ambulatory Visit (HOSPITAL_BASED_OUTPATIENT_CLINIC_OR_DEPARTMENT_OTHER)
Admission: RE | Admit: 2014-10-16 | Discharge: 2014-10-16 | Disposition: A | Discharge status: Home / Self Care | Payer: Medicare Other | Source: Ambulatory Visit | Attending: Cardiology | Admitting: Cardiology

## 2014-10-16 ENCOUNTER — Ambulatory Visit (HOSPITAL_COMMUNITY)
Admission: RE | Admit: 2014-10-16 | Discharge: 2014-10-16 | Disposition: A | Discharge status: Home / Self Care | Payer: Medicare Other | Source: Ambulatory Visit | Attending: Cardiology | Admitting: Cardiology

## 2014-10-16 VITALS — BP 138/74 | HR 89 | Wt 216.8 lb

## 2014-10-16 DIAGNOSIS — I2729 Other secondary pulmonary hypertension: Secondary | ICD-10-CM

## 2014-10-16 DIAGNOSIS — I272 Other secondary pulmonary hypertension: Secondary | ICD-10-CM

## 2014-10-16 DIAGNOSIS — I50810 Right heart failure, unspecified: Secondary | ICD-10-CM

## 2014-10-16 DIAGNOSIS — I509 Heart failure, unspecified: Secondary | ICD-10-CM | POA: Diagnosis not present

## 2014-10-16 DIAGNOSIS — I5032 Chronic diastolic (congestive) heart failure: Secondary | ICD-10-CM | POA: Diagnosis not present

## 2014-10-16 DIAGNOSIS — I2699 Other pulmonary embolism without acute cor pulmonale: Secondary | ICD-10-CM

## 2014-10-16 MED ORDER — APIXABAN 5 MG PO TABS: 2.5000 mg | ORAL_TABLET | Freq: Two times a day (BID) | ORAL | Status: DC

## 2014-10-16 MED ORDER — FUROSEMIDE 40 MG PO TABS: 20.0000 mg | ORAL_TABLET | Freq: Every day | ORAL | Status: DC

## 2014-10-16 NOTE — Progress Notes (Signed)
  Echocardiogram 2D Echocardiogram has been performed.  Melissa Shannon M 10/16/2014, 11:11 AM

## 2014-10-16 NOTE — Patient Instructions (Signed)
Decrease Furosemide to 20 mg (1/2 tab) daily  IN MAY decrease Eliquis to 2.5 mg twice daily  Follow up in 6 months

## 2014-10-17 ENCOUNTER — Telehealth: Payer: Self-pay | Admitting: *Deleted

## 2014-10-17 NOTE — Progress Notes (Signed)
Patient ID: Melissa Shannon, female   DOB: January 27, 1928, 81 y.o.   MRN: [id number] PCP: Dr. Bubba Camp  81 yo with history of HTN, DM, and immobility due to osteoarthritis presents for followup of admission with right heart failure and suspect acute PE.  At baseline, patient is not very active.  She has knee arthritis that is very limiting.  She does not do much walking and uses a walker to get around.  She has been very limited for a few years now.  She was admitted in 10/15 with dyspnea.  The dyspnea began about 2-3 days prior to admission.  She would get short of breath after walking about 20 feet with her walker.  She was noted to be in CHF at admission with troponin elevated to 0.6.  Echo showed normal LV size and systolic function but severely dilated/dysfunctional RV with pulmonary hypertension.  V/Q scan was done and showed intermediate probability for PE.  She was seen by pulmonary and thought to have a PE.  Apixaban was started.  She was diuresed with IV Lasix.  Repeat echo was done today.  This showed EF 55-60%, normal RV size and systolic function, and PA systolic pressure 28 mmHg.   She returns for follow up. Breathing is ok though she is not very active. Denies PND/Orthopnea. Ongoing knee pain walks with walking. No BRBPR or melena on apixaban.  Weight is stable.  Labs (06/18/14): K 4.4, creatinine 1.44, HCT 35.9, BNP 10167 => 1415 Labs (09/01/2014): K 4.2 Creatinine 1.23  Labs (2/16): K 4.3, creatinine 1.62, HCT 40.7  PMH: 1. HTN 2. Hyperlipidemia 3. Osteoarthritis: Involving knees, uses walker.  4. Type II diabetes 5. H/o CCY 6. CKD stage III 7. Hypothyroidism 8. PE: Admitted 10/15 with dyspnea/CHF.  Echo (10/15) with EF 60-65%, D-shaped interventricular septum, severely dilated RV with moderately decreased RV systolic function, PA systolic pressure 47 mmHg.  V/Q scan (10/15) was intermediate probability for PE. Patient was started on apixaban.  Echo (3/16) with EF 55-60%, basal inferior  hypokinesis, normal RV size and systolic function, PA systolic pressure 28 mmHg.  9. Nodules on CT chest 10/15: Per Dr Melvyn Novas, likely benign findings.   FH: No h/o venous thromboembolism, no heart problems that she knows of.   SH: Widow, lives alone, nonsmoker, 3 kids.   ROS: All systems reviewed and negative except as per HPI.   Current Outpatient Prescriptions  Medication Sig Dispense Refill  . AMBULATORY NON FORMULARY MEDICATION One touch mini lancets and strips, use every other day to test blood sugar Dx: E11.9 30 each 3  . [START ON 12/15/2014] apixaban (ELIQUIS) 5 MG TABS tablet Take 0.5 tablets (2.5 mg total) by mouth 2 (two) times daily. 60 tablet 3  . Cholecalciferol (VITAMIN D3) 5000 UNITS CHEW Chew 1 tablet by mouth daily.    . feeding supplement, ENSURE COMPLETE, (ENSURE COMPLETE) LIQD Take 237 mLs by mouth 2 (two) times daily between meals as needed (As requested by patient). (Patient taking differently: Take 237 mLs by mouth 2 (two) times daily between meals. ) 30 Bottle 0  . furosemide (LASIX) 40 MG tablet Take 0.5 tablets (20 mg total) by mouth daily. May take addiitonal 40 mg in the PM for weight 214 lbs. Or greater 120 tablet 1  . levothyroxine (SYNTHROID, LEVOTHROID) 100 MCG tablet Take 1 tablet (100 mcg total) by mouth daily before breakfast. For Hypothyroidism 90 tablet 1  . lisinopril (PRINIVIL,ZESTRIL) 10 MG tablet Take 1 tablet (10 mg total) by  mouth daily. 30 tablet 3  . Multiple Vitamin (MULTIVITAMIN WITH MINERALS) TABS tablet Take 1 tablet by mouth daily. 30 tablet 0  . polyethylene glycol (MIRALAX / GLYCOLAX) packet Take 17 g by mouth as needed.    . rosuvastatin (CRESTOR) 20 MG tablet Take 20 mg by mouth daily. For High Cholesterol    . linagliptin (TRADJENTA) 5 MG TABS tablet Take 1 tablet (5 mg total) by mouth daily. (Patient not taking: Reported on 10/16/2014) 30 tablet 3   No current facility-administered medications for this encounter.   BP 138/74 mmHg  Pulse 89   Wt 216 lb 12 oz (98.317 kg)  SpO2 95% General: NAD, obese Neck: No JVD, no thyromegaly or thyroid nodule.  Lungs: Clear to auscultation bilaterally with normal respiratory effort. CV: Nondisplaced PMI.  Heart mildly tachy, regular S1/S2, no S3/S4, no murmur.  Thick lower legs but no pitting edema.  No carotid bruit.  Normal pedal pulses.  Abdomen: Soft, nontender, no hepatosplenomegaly, no distention.  Skin: Intact without lesions or rashes.  Neurologic: Alert and oriented x 3.  Psych: Normal affect. Extremities: No clubbing or cyanosis.  HEENT: Normal.   Assessment/Plan 1. Right heart failure: Echo on 10/15 showed dilated RV with decreased systolic function.  V/Q scan with suspected PE.  Today's echo ( I reviewed) showed normal RV size and systolic function with normal estimate PA systolic pressure.  She is not volume overloaded on exam.  She is not limited by dyspnea (has orthopedic issues that keep her sedentary).  - She can decrease Lasix to 20 mg daily and can likely stop in the future.    2. PE: V/Q scan intermediate probability for PE in 10/15.  Pulmonary saw and suspect that she did indeed have PE.  The PE was spontaneous.  She is at risk given general immobility. No bleeding problems. Continue apixaban for 6 months (until April).  After that time, would give strong consideration to continuing apixaban at 2.5 mg bid long-term to decrease long-term risk as she will likely remain fairly immobile long-term due to her arthritis.  3. CKD: Cut back Lasix to 20 mg daily.   Loralie Champagne 10/17/2014

## 2014-10-17 NOTE — Telephone Encounter (Signed)
Prior Authorization done for Tradjenta 5mg . Spoke with [employer]. # [phone] [id number] and approved through 10/17/2015. Pharmacy[facility] [address] # 16/11/2015 Notified.

## 2014-10-27 ENCOUNTER — Ambulatory Visit (INDEPENDENT_AMBULATORY_CARE_PROVIDER_SITE_OTHER): Payer: Medicare Other | Admitting: Pharmacotherapy

## 2014-10-27 ENCOUNTER — Encounter: Payer: Self-pay | Admitting: Pharmacotherapy

## 2014-10-27 VITALS — BP 138/64 | HR 89 | Temp 98.6°F | Resp 20 | Ht 68.0 in | Wt 220.6 lb

## 2014-10-27 DIAGNOSIS — I10 Essential (primary) hypertension: Secondary | ICD-10-CM | POA: Diagnosis not present

## 2014-10-27 DIAGNOSIS — E119 Type 2 diabetes mellitus without complications: Secondary | ICD-10-CM

## 2014-10-27 MED ORDER — INSULIN GLARGINE 100 UNIT/ML SOLOSTAR PEN: 10.0000 [IU] | PEN_INJECTOR | Freq: Every day | SUBCUTANEOUS | Status: DC

## 2014-10-27 MED ORDER — INSULIN PEN NEEDLE 32G X 4 MM MISC: 1.0000 | Freq: Every day | Status: DC

## 2014-10-27 NOTE — Progress Notes (Signed)
  Subjective:    Melissa Shannon is a 81 y.o.white female who presents for follow-up of Type 2 diabetes mellitus.   A1C has been going up - 6.9% to 7.7% to 8.4% GFR:  28m/min.  Not a metformin, SGLT-2, or GLP-1 candidate. Triglycerides have improved to 198 (was 232).  LDL 72  Feeling much better now. Used to see Dr. TOsborne Casco  Decided while in the hospital to start seeing a geriatric specialist. [facility] have actually [facility]  Did not bring meter, but did bring her logbook. BG: 170-220 range.  Has significant peripheral edema. She tries to make healthy food choices.  She has been limiting her sodium. Her exercise is limited.  She does some chair exercises.  Tries to walk for exercise.  Wears corrective lenses. Denies problems with vision.  Eye exam with SLoran Sentersin December 2015. Denies problems with feet. Nocturia every night.   Review of Systems A comprehensive review of systems was negative except for: Eyes: positive for contacts/glasses Cardiovascular: positive for lower extremity edema Genitourinary: positive for nocturia    Objective:    BP 138/64 mmHg  Pulse 89  Temp(Src) 98.6 F (37 C) (Oral)  Resp 20  Ht 5' 8"$  (1.727 m)  Wt 220 lb 9.6 oz (100.064 kg)  BMI 33.55 kg/m2  SpO2 96%  General:  alert, cooperative and no distress  Oropharynx: normal findings: lips normal without lesions and gums healthy   Eyes:  negative findings: lids and lashes normal and conjunctivae and sclerae normal   Ears:  external ears normal        Lung: clear to auscultation bilaterally  Heart:  regular rate and rhythm     Extremities: edema bilateral lower extremities  Skin: dry     Neuro: mental status, speech normal, alert and oriented x3   Lab Review GLUCOSE (mg/dL)  Date Value  10/10/2014 201*  09/01/2014 192*   GLUCOSE, BLD (mg/dL)  Date Value  07/31/2014 193*  06/18/2014 148*  06/16/2014 219*   CO2  Date  Value  10/10/2014 25 mmol/L  09/01/2014 29 mmol/L  07/31/2014 27 mEq/L   BUN (mg/dL)  Date Value  10/10/2014 37*  09/01/2014 27  07/31/2014 32*  06/18/2014 38*  06/16/2014 40*   CREATININE, SER (mg/dL)  Date Value  10/10/2014 1.62*  09/01/2014 1.23*  07/31/2014 1.37*       Assessment:    Diabetes Mellitus type II, under poor control.   BP at goal <140/90   Plan:    1.  Rx changes: Add Lantus 10 units once daily.  Counseled on risk / benefit.  Taught her how to self-inject.  First dose given in office. 2.  Continue Tradjenta 539mdaily. 3.  Counseled on nutrition goals. 4.  Counseled on benefit of routine exercise.   Goal is 30-45 minutes 5 x week. 5.  Not a candidate for metformin, SGLT-2, GLP-1 due to renal function. 6.  BP at goal. 7.  RTC in 6 weeks.

## 2014-10-27 NOTE — Patient Instructions (Signed)
Start Lantus 10 units daily

## 2014-11-28 ENCOUNTER — Encounter (HOSPITAL_COMMUNITY): Payer: Self-pay | Admitting: Emergency Medicine

## 2014-11-28 ENCOUNTER — Emergency Department (HOSPITAL_COMMUNITY): Payer: Medicare Other

## 2014-11-28 ENCOUNTER — Inpatient Hospital Stay (HOSPITAL_COMMUNITY)
Admission: EM | Admit: 2014-11-28 | Discharge: 2014-11-30 | DRG: 689 | Disposition: A | Discharge status: Home / Self Care | Payer: Medicare Other | Attending: Internal Medicine | Admitting: Internal Medicine

## 2014-11-28 DIAGNOSIS — Z66 Do not resuscitate: Secondary | ICD-10-CM | POA: Diagnosis present

## 2014-11-28 DIAGNOSIS — E039 Hypothyroidism, unspecified: Secondary | ICD-10-CM | POA: Diagnosis present

## 2014-11-28 DIAGNOSIS — I509 Heart failure, unspecified: Secondary | ICD-10-CM

## 2014-11-28 DIAGNOSIS — N390 Urinary tract infection, site not specified: Principal | ICD-10-CM

## 2014-11-28 DIAGNOSIS — I129 Hypertensive chronic kidney disease with stage 1 through stage 4 chronic kidney disease, or unspecified chronic kidney disease: Secondary | ICD-10-CM | POA: Diagnosis present

## 2014-11-28 DIAGNOSIS — I248 Other forms of acute ischemic heart disease: Secondary | ICD-10-CM | POA: Diagnosis present

## 2014-11-28 DIAGNOSIS — E119 Type 2 diabetes mellitus without complications: Secondary | ICD-10-CM

## 2014-11-28 DIAGNOSIS — R0602 Shortness of breath: Secondary | ICD-10-CM | POA: Diagnosis not present

## 2014-11-28 DIAGNOSIS — Z833 Family history of diabetes mellitus: Secondary | ICD-10-CM

## 2014-11-28 DIAGNOSIS — R531 Weakness: Secondary | ICD-10-CM | POA: Diagnosis not present

## 2014-11-28 DIAGNOSIS — M1990 Unspecified osteoarthritis, unspecified site: Secondary | ICD-10-CM | POA: Diagnosis present

## 2014-11-28 DIAGNOSIS — Z880 Allergy status to penicillin: Secondary | ICD-10-CM

## 2014-11-28 DIAGNOSIS — I272 Other secondary pulmonary hypertension: Secondary | ICD-10-CM | POA: Diagnosis present

## 2014-11-28 DIAGNOSIS — Z7901 Long term (current) use of anticoagulants: Secondary | ICD-10-CM | POA: Diagnosis not present

## 2014-11-28 DIAGNOSIS — I5042 Chronic combined systolic (congestive) and diastolic (congestive) heart failure: Secondary | ICD-10-CM | POA: Diagnosis present

## 2014-11-28 DIAGNOSIS — I5032 Chronic diastolic (congestive) heart failure: Secondary | ICD-10-CM

## 2014-11-28 DIAGNOSIS — IMO0002 Reserved for concepts with insufficient information to code with codable children: Secondary | ICD-10-CM | POA: Diagnosis present

## 2014-11-28 DIAGNOSIS — I10 Essential (primary) hypertension: Secondary | ICD-10-CM | POA: Diagnosis not present

## 2014-11-28 DIAGNOSIS — Z794 Long term (current) use of insulin: Secondary | ICD-10-CM | POA: Diagnosis not present

## 2014-11-28 DIAGNOSIS — I4891 Unspecified atrial fibrillation: Secondary | ICD-10-CM | POA: Diagnosis present

## 2014-11-28 DIAGNOSIS — I2699 Other pulmonary embolism without acute cor pulmonale: Secondary | ICD-10-CM | POA: Diagnosis present

## 2014-11-28 DIAGNOSIS — E1129 Type 2 diabetes mellitus with other diabetic kidney complication: Secondary | ICD-10-CM | POA: Diagnosis present

## 2014-11-28 DIAGNOSIS — G9340 Encephalopathy, unspecified: Secondary | ICD-10-CM | POA: Diagnosis present

## 2014-11-28 DIAGNOSIS — E785 Hyperlipidemia, unspecified: Secondary | ICD-10-CM | POA: Diagnosis present

## 2014-11-28 DIAGNOSIS — Z86711 Personal history of pulmonary embolism: Secondary | ICD-10-CM

## 2014-11-28 DIAGNOSIS — Z808 Family history of malignant neoplasm of other organs or systems: Secondary | ICD-10-CM

## 2014-11-28 DIAGNOSIS — R4182 Altered mental status, unspecified: Secondary | ICD-10-CM | POA: Diagnosis not present

## 2014-11-28 DIAGNOSIS — E1122 Type 2 diabetes mellitus with diabetic chronic kidney disease: Secondary | ICD-10-CM | POA: Diagnosis present

## 2014-11-28 DIAGNOSIS — R4701 Aphasia: Secondary | ICD-10-CM | POA: Diagnosis present

## 2014-11-28 DIAGNOSIS — R51 Headache: Secondary | ICD-10-CM | POA: Diagnosis not present

## 2014-11-28 DIAGNOSIS — E1165 Type 2 diabetes mellitus with hyperglycemia: Secondary | ICD-10-CM | POA: Diagnosis present

## 2014-11-28 DIAGNOSIS — E440 Moderate protein-calorie malnutrition: Secondary | ICD-10-CM | POA: Diagnosis present

## 2014-11-28 DIAGNOSIS — N183 Chronic kidney disease, stage 3 (moderate): Secondary | ICD-10-CM | POA: Diagnosis present

## 2014-11-28 LAB — CBC WITH DIFFERENTIAL/PLATELET
BASOS ABS: 0 10*3/uL (ref 0.0–0.1)
BASOS PCT: 0 % (ref 0–1)
EOS ABS: 0.1 10*3/uL (ref 0.0–0.7)
Eosinophils Relative: 1 % (ref 0–5)
HCT: 39.4 % (ref 36.0–46.0)
Hemoglobin: 12.7 g/dL (ref 12.0–15.0)
Lymphocytes Relative: 19 % (ref 12–46)
Lymphs Abs: 1.7 10*3/uL (ref 0.7–4.0)
MCH: 28.8 pg (ref 26.0–34.0)
MCHC: 32.2 g/dL (ref 30.0–36.0)
MCV: 89.3 fL (ref 78.0–100.0)
MONO ABS: 0.9 10*3/uL (ref 0.1–1.0)
Monocytes Relative: 10 % (ref 3–12)
NEUTROS ABS: 6.6 10*3/uL (ref 1.7–7.7)
Neutrophils Relative %: 70 % (ref 43–77)
Platelets: 200 10*3/uL (ref 150–400)
RBC: 4.41 MIL/uL (ref 3.87–5.11)
RDW: 13.5 % (ref 11.5–15.5)
WBC: 9.3 10*3/uL (ref 4.0–10.5)

## 2014-11-28 LAB — TROPONIN I: TROPONIN I: 0.04 ng/mL — AB (ref <0.031)

## 2014-11-28 LAB — COMPREHENSIVE METABOLIC PANEL
ALBUMIN: 4.1 g/dL (ref 3.5–5.2)
ALT: 14 U/L (ref 0–35)
AST: 16 U/L (ref 0–37)
Alkaline Phosphatase: 76 U/L (ref 39–117)
Anion gap: 11 (ref 5–15)
BUN: 28 mg/dL — ABNORMAL HIGH (ref 6–23)
CO2: 27 mmol/L (ref 19–32)
Calcium: 10 mg/dL (ref 8.4–10.5)
Chloride: 101 mmol/L (ref 96–112)
Creatinine, Ser: 1.9 mg/dL — ABNORMAL HIGH (ref 0.50–1.10)
GFR calc Af Amer: 27 mL/min — ABNORMAL LOW (ref >90)
GFR calc non Af Amer: 23 mL/min — ABNORMAL LOW (ref >90)
Glucose, Bld: 119 mg/dL — ABNORMAL HIGH (ref 70–99)
POTASSIUM: 3.8 mmol/L (ref 3.5–5.1)
Sodium: 139 mmol/L (ref 135–145)
Total Bilirubin: 0.7 mg/dL (ref 0.3–1.2)
Total Protein: 7 g/dL (ref 6.0–8.3)

## 2014-11-28 LAB — URINALYSIS, ROUTINE W REFLEX MICROSCOPIC
Bilirubin Urine: NEGATIVE
GLUCOSE, UA: NEGATIVE mg/dL
Hgb urine dipstick: NEGATIVE
Ketones, ur: NEGATIVE mg/dL
Nitrite: POSITIVE — AB
PH: 6 (ref 5.0–8.0)
PROTEIN: NEGATIVE mg/dL
Specific Gravity, Urine: 1.017 (ref 1.005–1.030)
Urobilinogen, UA: 0.2 mg/dL (ref 0.0–1.0)

## 2014-11-28 LAB — URINE MICROSCOPIC-ADD ON

## 2014-11-28 LAB — BRAIN NATRIURETIC PEPTIDE: B NATRIURETIC PEPTIDE 5: 49.1 pg/mL (ref 0.0–100.0)

## 2014-11-28 LAB — CBG MONITORING, ED: Glucose-Capillary: 130 mg/dL — ABNORMAL HIGH (ref 70–99)

## 2014-11-28 MED ORDER — CIPROFLOXACIN HCL 500 MG PO TABS
500.0000 mg | ORAL_TABLET | Freq: Once | ORAL | Status: AC
Start: 2014-11-28 — End: 2014-11-28
  Administered 2014-11-28: 500 mg via ORAL
  Filled 2014-11-28: qty 1

## 2014-11-28 NOTE — ED Notes (Addendum)
Patient transported to X-ray 

## 2014-11-28 NOTE — H&P (Addendum)
[facility] History and Physical  Patient: Melissa Shannon  MRN: [id number]  DOB: [birth year]  DOS: the patient was seen and examined on 11/28/2014 PCP: Lauree Chandler, NP  Chief Complaint: Speech difficulty  HPI: Melissa Shannon is a 81 y.o. female with Past medical history of hypertension, diabetes mellitus, urinary incontinence, hypothyroidism, pulmonary embolism, A. fib, on anticoagulation, chronic kidney disease. The patient is presenting with complaints of generalized weakness associated with speech difficulty. She mentions that she has been having generalized weakness ongoing since last 2 days. She woke up this morning and has not been feeling herself. She has been disoriented as per family. She also has been having difficulty with speech with difficulty bending of the right words. Her words are still understandable. She denies any chest pain or shortness of breath. Does not have any cough. Does not have any blurring of the vision or any other focal deficit. She has chronic leg swelling. She does not have any diarrhea or constipation currently. Does not have any blood in her urine but occasional burning has been reported. Does not have any abdominal pain. Complains of nausea but no vomiting. She is taking half of Eliquis twice a day. She is taking full dose of Lasix daily. She was recommended to take full Eliquis and start taking half Eliquis on May 2 and also reduce her dose of Lasix to 20 mg which will be half tablet of 40 mg.  The patient is coming from home. And at her baseline independent for most of her ADL.  Review of Systems: as mentioned in the history of present illness.  A comprehensive review of the other systems is negative.  Past Medical History  Diagnosis Date  . Hypertension   . Diabetes mellitus without complication   . Hyperlipidemia   . Urinary incontinence   . Hypothyroidism   . Arthritis     bilateral knees  . Pulmonary embolism   . Edema    . CHF (congestive heart failure)    Past Surgical History  Procedure Laterality Date  . Cholecystectomy    . Tonsillectomy and adenoidectomy  1937    Dr.Brewer  . Pilonidal cyst excision  [facility]      bilateral cataracts  . Right elbow  1999    x 2, still has one piece of metal in it  . Bladder surgery  2000    Dr.Tannerbaum (Bladder Tact)  . Cataract extraction  03/2013    Dr.Shapiro   Social History:  reports that she has never smoked. She has never used smokeless tobacco. She reports that she does not drink alcohol or use illicit drugs.  Allergies  Allergen Reactions  . Penicillins Other (See Comments)    Throat felt tight    Family History  Problem Relation Age of Onset  . Suicidality Father   . Cancer Brother   . Cancer Maternal Grandfather   . Diabetes Maternal Grandmother   . Melanoma Brother     Prior to Admission medications   Medication Sig Start Date End Date Taking? Authorizing Provider  apixaban (ELIQUIS) 5 MG TABS tablet Take 0.5 tablets (2.5 mg total) by mouth 2 (two) times daily. 12/15/14  Yes Larey Dresser, MD  Cholecalciferol (VITAMIN D3) 5000 UNITS CHEW Chew 1 tablet by mouth daily.   Yes Historical Provider, MD  feeding supplement, ENSURE COMPLETE, (ENSURE COMPLETE) LIQD Take 237 mLs by mouth 2 (two) times daily between meals  as needed (As requested by patient). Patient taking differently: Take 237 mLs by mouth 2 (two) times daily between meals.  06/19/14  Yes Belkys A Regalado, MD  furosemide (LASIX) 40 MG tablet Take 0.5 tablets (20 mg total) by mouth daily. May take addiitonal 40 mg in the PM for weight 214 lbs. Or greater 10/16/14  Yes Larey Dresser, MD  Insulin Glargine (LANTUS SOLOSTAR) 100 UNIT/ML Solostar Pen Inject 10 Units into the skin daily. 10/27/14  Yes Tivis Ringer, RPH-CPP  levothyroxine (SYNTHROID, LEVOTHROID) 100 MCG tablet Take 1 tablet (100 mcg total) by mouth daily before breakfast. For Hypothyroidism  07/24/14  Yes Lauree Chandler, NP  linagliptin (TRADJENTA) 5 MG TABS tablet Take 1 tablet (5 mg total) by mouth daily. 10/13/14  Yes Lauree Chandler, NP  lisinopril (PRINIVIL,ZESTRIL) 10 MG tablet Take 1 tablet (10 mg total) by mouth daily. 09/09/14  Yes Lauree Chandler, NP  Multiple Vitamin (MULTIVITAMIN WITH MINERALS) TABS tablet Take 1 tablet by mouth daily. 06/19/14  Yes Belkys A Regalado, MD  polyethylene glycol (MIRALAX / GLYCOLAX) packet Take 17 g by mouth as needed for mild constipation.    Yes Historical Provider, MD  rosuvastatin (CRESTOR) 20 MG tablet Take 20 mg by mouth daily. For High Cholesterol   Yes Historical Provider, MD    Physical Exam: Filed Vitals:   11/28/14 2100 11/28/14 2115 11/28/14 2145 11/28/14 2200  BP: 153/69 148/70 151/70 146/98  Pulse: 74 72 69 81  Temp:      TempSrc:      Resp: 19 16 29 15   Height:      Weight:      SpO2: 95% 97% 97% 97%    General: Alert, Awake and Oriented to Time, Place and Person. Appear in mild distress Eyes: PERRL ENT: Oral Mucosa clear moist. Neck: no JVD Cardiovascular: S1 and S2 Present, aortic systolic Murmur, Peripheral Pulses Present Respiratory: Bilateral Air entry equal and Decreased,  Clear to Auscultation, noCrackles, no wheezes Abdomen: Bowel Sound present, Soft and non tender Skin: no Rash Extremities: Bilateral Pedal edema, no calf tenderness Neurologic: Grossly no focal neuro deficit.  Labs on Admission:  CBC:  Recent Labs Lab 11/28/14 1952  WBC 9.3  NEUTROABS 6.6  HGB 12.7  HCT 39.4  MCV 89.3  PLT 200    CMP     Component Value Date/Time   NA 139 11/28/2014 1952   NA 140 10/10/2014 0929   K 3.8 11/28/2014 1952   CL 101 11/28/2014 1952   CO2 27 11/28/2014 1952   GLUCOSE 119* 11/28/2014 1952   GLUCOSE 201* 10/10/2014 0929   BUN 28* 11/28/2014 1952   BUN 37* 10/10/2014 0929   CREATININE 1.90* 11/28/2014 1952   CALCIUM 10.0 11/28/2014 1952   PROT 7.0 11/28/2014 1952   PROT 6.5  10/10/2014 0929   ALBUMIN 4.1 11/28/2014 1952   AST 16 11/28/2014 1952   ALT 14 11/28/2014 1952   ALKPHOS 76 11/28/2014 1952   BILITOT 0.7 11/28/2014 1952   BILITOT 0.4 10/10/2014 0929   GFRNONAA 23* 11/28/2014 1952   GFRAA 27* 11/28/2014 1952    No results for input(s): LIPASE, AMYLASE in the last 168 hours.   Recent Labs Lab 11/28/14 1952  TROPONINI 0.04*   BNP (last 3 results)  Recent Labs  11/28/14 1952  BNP 49.1    ProBNP (last 3 results)  Recent Labs  06/11/14 1814 06/16/14 0418 06/18/14 0400  PROBNP 10167.0* 3540.0* 1415.0*     Radiological  Exams on Admission: Dg Chest 2 View  11/28/2014   CLINICAL DATA:  Shortness of breath, weakness, confusion. Symptoms for 1 day.  EXAM: CHEST  2 VIEW  COMPARISON:  Chest radiograph 09/22/2014, CT 09/11/2014  FINDINGS: The cardiomediastinal contours are unchanged, heart size is normal. Mild hyperinflation, unchanged. Pulmonary vasculature is normal. The tubular right middle lobe opacity on prior CT is not well seen radiographically. No consolidation, pleural effusion, or pneumothorax. No acute osseous abnormalities are seen. Probable intra-articular body in the left shoulder joint.  IMPRESSION: No acute pulmonary process.   Electronically Signed   By: Jeb Levering M.D.   On: 11/28/2014 19:50   Ct Head Wo Contrast  11/28/2014   CLINICAL DATA:  Headache, weakness, shortness of breath and mental status changes since yesterday.  EXAM: CT HEAD WITHOUT CONTRAST  TECHNIQUE: Contiguous axial images were obtained from the base of the skull through the vertex without intravenous contrast.  COMPARISON:  None.  FINDINGS: Age related cerebral atrophy, ventriculomegaly and periventricular white matter disease. No extra-axial fluid collections are identified. No CT findings for acute hemispheric infarction or intracranial hemorrhage. No mass lesions. The brainstem and cerebellum are normal.  No acute bony findings. Scattered scalp  calcifications are noted. The left frontal sinus is opacified. There is also scattered opacification of some of the left-sided ethmoid air cells and the left half of the sphenoid sinus. The mastoid air cells and middle ear cavities are clear.  IMPRESSION: Age related cerebral atrophy, ventriculomegaly and periventricular white matter disease. No acute intracranial findings or mass lesions.  Scattered left-sided sinus disease.   Electronically Signed   By: Marijo Sanes M.D.   On: 11/28/2014 20:07   Mr Brain Wo Contrast  11/28/2014   CLINICAL DATA:  Initial evaluation for acute onset aphasia, headache, weakness. Symptomatic since yesterday.  EXAM: MRI HEAD WITHOUT CONTRAST  TECHNIQUE: Multiplanar, multiecho pulse sequences of the brain and surrounding structures were obtained without intravenous contrast.  COMPARISON:  Prior CT from earlier the same day.  FINDINGS: Mild diffuse prominence of the CSF containing spaces is compatible with generalized cerebral atrophy. Patchy and confluent T2/FLAIR hyperintensity within the periventricular and deep white matter both cerebral hemispheres most consistent with chronic small vessel ischemic disease, fairly mild for patient age. Several small remote infarcts present within the left cerebellar hemisphere.  No abnormal foci of restricted diffusion to suggest acute intracranial infarct. Gray-white matter differentiation maintained. Normal intravascular flow voids are preserved. No acute or chronic intracranial hemorrhage identified.  No mass lesion or midline shift. No mass effect. There is no hydrocephalus. No extra-axial fluid collection.  Craniocervical junction within normal limits. Pituitary gland normal. No acute abnormality about the orbits. Sequelae of prior cataract extraction noted bilaterally.  Mucoperiosteal thickening present within the maxillary sinuses. The left frontal sinus and anterior left ethmoidal air cells are opacified. No mastoid effusion. Inner ear  structures normal.  Mild degenerative changes noted within the upper cervical spine. Bone marrow signal intensity normal. Scalp soft tissues unremarkable.  IMPRESSION: 1. No acute intracranial infarct or other abnormality identified. 2. Small remote left cerebellar infarcts. 3. Age-related cerebral atrophy with mild chronic small vessel ischemic disease. 4. Left frontoethmoidal and maxillary sinus disease.   Electronically Signed   By: Jeannine Boga M.D.   On: 11/28/2014 23:14   EKG: Independently reviewed. normal sinus rhythm, nonspecific ST and T waves changes.  Assessment/Plan Principal Problem:   Aphasia Active Problems:   Hypertension   CHF (congestive heart failure)  Pulmonary hypertension   Pulmonary embolus   General weakness   UTI (urinary tract infection)   Acute encephalopathy   Diabetes mellitus with renal manifestations, uncontrolled   1. Aphasia UTI  The patient is presenting with complaints of expressive aphasia. CT of the head and MRI are negative for any any strokes. She continues to have complaints of generalized weakness. Most likely has symptoms are secondary to UTI. We will treat her with ciprofloxacin.  2. Chronic kidney disease. Chronic CHF.  Serum creatinine mildly elevated. Patient was recommended to reduce her dose of Lasix. We will continue to hold Lasix at present. Continue monitoring BMP. Avoid nephrotoxic medications. A renal dozing off all the medications.  3. Chronic anticoagulation for PE as well as A. fib. At present patient certainly continuing full dose of Eliquis. Therefore I would continue her on full dose of Eliquis and will resume half dose of Eliquis starting May 2.  4. Diabetes mellitus. With chronic kidney disease Placing her on sliding scale since she will remain nothing by mouth. We will start her Lantus on 11/30/2014.  5. Elevated troponin. Patient does not have any compressive chest pain. Initially she did have some  shortness of breath on exertion. Which she is denying at present. EKG does not show any evidence of ischemia. We'll continue to monitor serial troponin. Most likely a troponin is elevated secondary to chronic kidney disease. Echocardiogram in the morning. She remains nothing by mouth.  Advance goals of care discussion: DNR/DNI as per my discussion with patient and family  DVT Prophylaxis: on chronic therapeutic anticoagulation. Nutrition: Nothing by mouth  Family Communication: family was present at bedside, opportunity was given to ask question and all questions were answered satisfactorily at the time of interview. Disposition: Admitted as observation, telemetry unit.  Author: Berle Mull, MD [facility] Pager: [phone] 11/28/2014  If 7PM-7AM, please contact night-coverage [email or web address] Password [email or web address]

## 2014-11-28 NOTE — ED Provider Notes (Signed)
CSN: [id number]     Arrival date & time 11/28/14  1826 History   First MD Initiated Contact with Patient 11/28/14 1841     Chief Complaint  Patient presents with  . Shortness of Breath  . Weakness     (Consider location/radiation/quality/duration/timing/severity/associated sxs/prior Treatment) HPI Comments: Patient presents with weakness and shortness of breath. She states that 2 days ago she started having a headache to the left side of her head. She states the headache is fairly mild but she's woke up for the last 2 days with a headache. She feels like she's having trouble with disorientation. She knows what she wants to say but she's having trouble finding words. She also has noticed that she has a diffuse tremor. Her lips have been trembling and her hands up in having tremors. She denies any unilateral weakness. She feels generally weak. She is able to ambulate. She denies any vision changes. She denies any dizziness. She denies any facial numbness or drooping. She denies any recent falls or injuries. Of note she is on eloquent with for a recent DVT with probable pulmonary embolus. This was last fall. She denies any chest pain. She has been having some intermittent palpitations over the last 2 days. She also has been feeling more short of breath than normal with shortness of breath on minimal exertion. She has lymphedema with baseline leg swelling but denies any change from her baseline. She denies any fevers. She denies any urinary symptoms. She denies any vomiting or diarrhea.  Patient is a 81 y.o. female presenting with shortness of breath and weakness.  Shortness of Breath Associated symptoms: headaches   Associated symptoms: no abdominal pain, no chest pain, no cough, no diaphoresis, no fever, no rash and no vomiting   Weakness Associated symptoms include headaches and shortness of breath. Pertinent negatives include no chest pain and no abdominal pain.    Past Medical History   Diagnosis Date  . Hypertension   . Diabetes mellitus without complication   . Hyperlipidemia   . Urinary incontinence   . Hypothyroidism   . Arthritis     bilateral knees  . Pulmonary embolism   . Edema   . CHF (congestive heart failure)    Past Surgical History  Procedure Laterality Date  . Cholecystectomy    . Tonsillectomy and adenoidectomy  1937    Dr.Brewer  . Pilonidal cyst excision  [facility] surgery      bilateral cataracts  . Right elbow  1999    x 2, still has one piece of metal in it  . Bladder surgery  2000    Dr.Tannerbaum (Bladder Tact)  . Cataract extraction  03/2013    Dr.Shapiro   Family History  Problem Relation Age of Onset  . Suicidality Father   . Cancer Brother   . Cancer Maternal Grandfather   . Diabetes Maternal Grandmother   . Melanoma Brother    History  Substance Use Topics  . Smoking status: Never Smoker   . Smokeless tobacco: Never Used  . Alcohol Use: No   OB History    No data available     Review of Systems  Constitutional: Positive for fatigue. Negative for fever, chills and diaphoresis.  HENT: Negative for congestion, rhinorrhea and sneezing.   Eyes: Negative.   Respiratory: Positive for shortness of breath. Negative for cough and chest tightness.   Cardiovascular: Positive for palpitations. Negative for chest pain and leg  swelling.  Gastrointestinal: Negative for nausea, vomiting, abdominal pain, diarrhea and blood in stool.  Genitourinary: Negative for frequency, hematuria, flank pain and difficulty urinating.  Musculoskeletal: Negative for back pain and arthralgias.  Skin: Negative for rash.  Neurological: Positive for tremors, speech difficulty, weakness and headaches. Negative for dizziness and numbness.      Allergies  Penicillins  Home Medications   Prior to Admission medications   Medication Sig Start Date End Date Taking? Authorizing Provider  apixaban (ELIQUIS) 5 MG TABS tablet Take  0.5 tablets (2.5 mg total) by mouth 2 (two) times daily. 12/15/14  Yes Larey Dresser, MD  Cholecalciferol (VITAMIN D3) 5000 UNITS CHEW Chew 1 tablet by mouth daily.   Yes Historical Provider, MD  feeding supplement, ENSURE COMPLETE, (ENSURE COMPLETE) LIQD Take 237 mLs by mouth 2 (two) times daily between meals as needed (As requested by patient). Patient taking differently: Take 237 mLs by mouth 2 (two) times daily between meals.  06/19/14  Yes Belkys A Regalado, MD  furosemide (LASIX) 40 MG tablet Take 0.5 tablets (20 mg total) by mouth daily. May take addiitonal 40 mg in the PM for weight 214 lbs. Or greater 10/16/14  Yes Larey Dresser, MD  Insulin Glargine (LANTUS SOLOSTAR) 100 UNIT/ML Solostar Pen Inject 10 Units into the skin daily. 10/27/14  Yes Tivis Ringer, RPH-CPP  levothyroxine (SYNTHROID, LEVOTHROID) 100 MCG tablet Take 1 tablet (100 mcg total) by mouth daily before breakfast. For Hypothyroidism 07/24/14  Yes Lauree Chandler, NP  linagliptin (TRADJENTA) 5 MG TABS tablet Take 1 tablet (5 mg total) by mouth daily. 10/13/14  Yes Lauree Chandler, NP  lisinopril (PRINIVIL,ZESTRIL) 10 MG tablet Take 1 tablet (10 mg total) by mouth daily. 09/09/14  Yes Lauree Chandler, NP  Multiple Vitamin (MULTIVITAMIN WITH MINERALS) TABS tablet Take 1 tablet by mouth daily. 06/19/14  Yes Belkys A Regalado, MD  polyethylene glycol (MIRALAX / GLYCOLAX) packet Take 17 g by mouth as needed for mild constipation.    Yes Historical Provider, MD  rosuvastatin (CRESTOR) 20 MG tablet Take 20 mg by mouth daily. For High Cholesterol   Yes Historical Provider, MD   BP 148/70 mmHg  Pulse 72  Temp(Src) 98.1 F (36.7 C) (Oral)  Resp 16  Ht 5\' 8"  (1.727 m)  Wt 210 lb (95.255 kg)  BMI 31.94 kg/m2  SpO2 97% Physical Exam  Constitutional: She is oriented to person, place, and time. She appears well-developed and well-nourished.  HENT:  Head: Normocephalic and atraumatic.  No nystagmus  Eyes: EOM are normal. Pupils  are equal, round, and reactive to light.  Visual fields full to confrontation  Neck: Normal range of motion. Neck supple.  Cardiovascular: Normal rate, regular rhythm and normal heart sounds.   Pulmonary/Chest: Effort normal and breath sounds normal. No respiratory distress. She has no wheezes. She has no rales. She exhibits no tenderness.  Abdominal: Soft. Bowel sounds are normal. There is no tenderness. There is no rebound and no guarding.  Musculoskeletal: Normal range of motion. She exhibits no edema.  Lymphadenopathy:    She has no cervical adenopathy.  Neurological: She is alert and oriented to person, place, and time.  Motor 5 out of 5 in upper extremities, 4 out of 5 in the lower extremities but patient seems to be at baseline. She has normal sensation to light touch in all extremities. Finger-nose intact. Cranial nerves II through XII grossly intact. No pronator drift.  Skin: Skin is warm and dry. No  rash noted.  Psychiatric: She has a normal mood and affect.    ED Course  Procedures (including critical care time) Labs Review Labs Reviewed  COMPREHENSIVE METABOLIC PANEL - Abnormal; Notable for the following:    Glucose, Bld 119 (*)    BUN 28 (*)    Creatinine, Ser 1.90 (*)    GFR calc non Af Amer 23 (*)    GFR calc Af Amer 27 (*)    All other components within normal limits  URINALYSIS, ROUTINE W REFLEX MICROSCOPIC - Abnormal; Notable for the following:    APPearance CLOUDY (*)    Nitrite POSITIVE (*)    Leukocytes, UA LARGE (*)    All other components within normal limits  TROPONIN I - Abnormal; Notable for the following:    Troponin I 0.04 (*)    All other components within normal limits  URINE MICROSCOPIC-ADD ON - Abnormal; Notable for the following:    Bacteria, UA MANY (*)    All other components within normal limits  CBG MONITORING, ED - Abnormal; Notable for the following:    Glucose-Capillary 130 (*)    All other components within normal limits  URINE CULTURE   CBC WITH DIFFERENTIAL/PLATELET  BRAIN NATRIURETIC PEPTIDE    Imaging Review Dg Chest 2 View  11/28/2014   CLINICAL DATA:  Shortness of breath, weakness, confusion. Symptoms for 1 day.  EXAM: CHEST  2 VIEW  COMPARISON:  Chest radiograph 09/22/2014, CT 09/11/2014  FINDINGS: The cardiomediastinal contours are unchanged, heart size is normal. Mild hyperinflation, unchanged. Pulmonary vasculature is normal. The tubular right middle lobe opacity on prior CT is not well seen radiographically. No consolidation, pleural effusion, or pneumothorax. No acute osseous abnormalities are seen. Probable intra-articular body in the left shoulder joint.  IMPRESSION: No acute pulmonary process.   Electronically Signed   By: Jeb Levering M.D.   On: 11/28/2014 19:50   Ct Head Wo Contrast  11/28/2014   CLINICAL DATA:  Headache, weakness, shortness of breath and mental status changes since yesterday.  EXAM: CT HEAD WITHOUT CONTRAST  TECHNIQUE: Contiguous axial images were obtained from the base of the skull through the vertex without intravenous contrast.  COMPARISON:  None.  FINDINGS: Age related cerebral atrophy, ventriculomegaly and periventricular white matter disease. No extra-axial fluid collections are identified. No CT findings for acute hemispheric infarction or intracranial hemorrhage. No mass lesions. The brainstem and cerebellum are normal.  No acute bony findings. Scattered scalp calcifications are noted. The left frontal sinus is opacified. There is also scattered opacification of some of the left-sided ethmoid air cells and the left half of the sphenoid sinus. The mastoid air cells and middle ear cavities are clear.  IMPRESSION: Age related cerebral atrophy, ventriculomegaly and periventricular white matter disease. No acute intracranial findings or mass lesions.  Scattered left-sided sinus disease.   Electronically Signed   By: Marijo Sanes M.D.   On: 11/28/2014 20:07     EKG  Interpretation   Date/Time:  Friday November 28 2014 18:44:40 EDT Ventricular Rate:  76 PR Interval:  170 QRS Duration: 83 QT Interval:  362 QTC Calculation: 407 R Axis:   37 Text Interpretation:  Sinus rhythm Paired ventricular premature complexes  since last tracing no significant change Confirmed by Alix Lahmann  MD, Delesa Kawa  ([id number]) on 11/28/2014 7:26:40 PM      MDM   Final diagnoses:  Aphasia  Shortness of breath    Patient presents with aphasia and associated tremor. She also has a  headache and shortness of breath with some intermittent palpitations. She has a very minimally elevated troponin but no ischemic changes on her EKG. She has no chest pain.  She is on eloquent is in has no symptoms that would be more consistent with a PE. She has no hypoxia or tachycardia. She does have some subtle word finding difficulty but no other neurologic deficits. She does have a mild resting tremor bilaterally. Her head CT is negative. I spoke with the hospitalist, Dr. Posey Pronto who will admit the patient. I also spoke with Dr. Nicole Kindred with neurology who is fine with the patient stating Elvina Sidle however if the patient ends up having a stroke on MR, she will need to be transferred to [facility]. However were unable to get an MR tonight. At this point her urine has come back and it does appear that she has a UTI which could be contributing to her symptoms. She has a penicillin allergy and I gave her dose of Cipro. Her urine culture was also sent.   Malvin Johns, MD 11/28/14 [phone]

## 2014-11-28 NOTE — ED Notes (Signed)
Pt reports onset headache, weakness, SOB, tremors, and difficulty answering questions, onset yesterday.

## 2014-11-28 NOTE — ED Notes (Signed)
Let pt know a urine sample is needed from her, she says she does not have to go right now

## 2014-11-29 DIAGNOSIS — E1129 Type 2 diabetes mellitus with other diabetic kidney complication: Secondary | ICD-10-CM | POA: Diagnosis present

## 2014-11-29 DIAGNOSIS — R531 Weakness: Secondary | ICD-10-CM

## 2014-11-29 DIAGNOSIS — IMO0002 Reserved for concepts with insufficient information to code with codable children: Secondary | ICD-10-CM | POA: Diagnosis present

## 2014-11-29 DIAGNOSIS — N390 Urinary tract infection, site not specified: Secondary | ICD-10-CM

## 2014-11-29 DIAGNOSIS — E1165 Type 2 diabetes mellitus with hyperglycemia: Secondary | ICD-10-CM

## 2014-11-29 DIAGNOSIS — G9340 Encephalopathy, unspecified: Secondary | ICD-10-CM | POA: Diagnosis present

## 2014-11-29 LAB — GLUCOSE, CAPILLARY
GLUCOSE-CAPILLARY: 103 mg/dL — AB (ref 70–99)
Glucose-Capillary: 114 mg/dL — ABNORMAL HIGH (ref 70–99)
Glucose-Capillary: 114 mg/dL — ABNORMAL HIGH (ref 70–99)
Glucose-Capillary: 106 mg/dL — ABNORMAL HIGH (ref 70–99)
Glucose-Capillary: 129 mg/dL — ABNORMAL HIGH (ref 70–99)

## 2014-11-29 LAB — CBC WITH DIFFERENTIAL/PLATELET
Basophils Absolute: 0 10*3/uL (ref 0.0–0.1)
Basophils Relative: 0 % (ref 0–1)
EOS ABS: 0.1 10*3/uL (ref 0.0–0.7)
EOS PCT: 2 % (ref 0–5)
HCT: 40.1 % (ref 36.0–46.0)
HEMOGLOBIN: 13.1 g/dL (ref 12.0–15.0)
LYMPHS PCT: 25 % (ref 12–46)
Lymphs Abs: 2.3 10*3/uL (ref 0.7–4.0)
MCH: 29.2 pg (ref 26.0–34.0)
MCHC: 32.7 g/dL (ref 30.0–36.0)
MCV: 89.3 fL (ref 78.0–100.0)
MONOS PCT: 10 % (ref 3–12)
Monocytes Absolute: 0.9 10*3/uL (ref 0.1–1.0)
NEUTROS ABS: 5.8 10*3/uL (ref 1.7–7.7)
NEUTROS PCT: 63 % (ref 43–77)
Platelets: 200 10*3/uL (ref 150–400)
RBC: 4.49 MIL/uL (ref 3.87–5.11)
RDW: 13.5 % (ref 11.5–15.5)
WBC: 9.3 10*3/uL (ref 4.0–10.5)

## 2014-11-29 LAB — COMPREHENSIVE METABOLIC PANEL
ALBUMIN: 3.9 g/dL (ref 3.5–5.2)
ALT: 12 U/L (ref 0–35)
AST: 18 U/L (ref 0–37)
Alkaline Phosphatase: 75 U/L (ref 39–117)
Anion gap: 8 (ref 5–15)
BILIRUBIN TOTAL: 0.9 mg/dL (ref 0.3–1.2)
BUN: 27 mg/dL — ABNORMAL HIGH (ref 6–23)
CALCIUM: 10.1 mg/dL (ref 8.4–10.5)
CO2: 28 mmol/L (ref 19–32)
CREATININE: 1.49 mg/dL — AB (ref 0.50–1.10)
Chloride: 102 mmol/L (ref 96–112)
GFR calc non Af Amer: 31 mL/min — ABNORMAL LOW (ref >90)
GFR, EST AFRICAN AMERICAN: 36 mL/min — AB (ref >90)
Glucose, Bld: 129 mg/dL — ABNORMAL HIGH (ref 70–99)
POTASSIUM: 3.8 mmol/L (ref 3.5–5.1)
Sodium: 138 mmol/L (ref 135–145)
Total Protein: 7 g/dL (ref 6.0–8.3)

## 2014-11-29 LAB — TROPONIN I
TROPONIN I: 0.05 ng/mL — AB (ref <0.031)
Troponin I: 0.04 ng/mL — ABNORMAL HIGH (ref <0.031)
Troponin I: 0.04 ng/mL — ABNORMAL HIGH (ref <0.031)

## 2014-11-29 LAB — TSH: TSH: 0.737 u[IU]/mL (ref 0.350–4.500)

## 2014-11-29 LAB — PROTIME-INR
INR: 1.09 (ref 0.00–1.49)
Prothrombin Time: 14.2 seconds (ref 11.6–15.2)

## 2014-11-29 LAB — APTT: APTT: 32 s (ref 24–37)

## 2014-11-29 MED ORDER — INSULIN ASPART 100 UNIT/ML ~~LOC~~ SOLN
0.0000 [IU] | Freq: Four times a day (QID) | SUBCUTANEOUS | Status: DC
  Administered 2014-11-29 – 2014-11-30 (×2): 1 [IU] via SUBCUTANEOUS

## 2014-11-29 MED ORDER — INSULIN GLARGINE 100 UNIT/ML ~~LOC~~ SOLN: 10.0000 [IU] | Freq: Every day | SUBCUTANEOUS | Status: DC

## 2014-11-29 MED ORDER — CIPROFLOXACIN IN D5W 400 MG/200ML IV SOLN
400.0000 mg | Freq: Two times a day (BID) | INTRAVENOUS | Status: DC
  Administered 2014-11-29 – 2014-11-30 (×2): 400 mg via INTRAVENOUS
  Filled 2014-11-29 (×2): qty 200

## 2014-11-29 MED ORDER — ENSURE ENLIVE PO LIQD
237.0000 mL | Freq: Two times a day (BID) | ORAL | Status: DC
  Administered 2014-11-29 – 2014-11-30 (×2): 237 mL via ORAL

## 2014-11-29 MED ORDER — CIPROFLOXACIN HCL 500 MG PO TABS
500.0000 mg | ORAL_TABLET | ORAL | Status: DC
  Administered 2014-11-29: 500 mg via ORAL
  Filled 2014-11-29: qty 1

## 2014-11-29 MED ORDER — SODIUM CHLORIDE 0.9 % IJ SOLN
3.0000 mL | Freq: Two times a day (BID) | INTRAMUSCULAR | Status: DC
  Administered 2014-11-29 – 2014-11-30 (×3): 3 mL via INTRAVENOUS

## 2014-11-29 MED ORDER — ACETAMINOPHEN 650 MG RE SUPP: 650.0000 mg | Freq: Four times a day (QID) | RECTAL | Status: DC | PRN

## 2014-11-29 MED ORDER — CIPROFLOXACIN IN D5W 400 MG/200ML IV SOLN: 400.0000 mg | INTRAVENOUS | Status: DC

## 2014-11-29 MED ORDER — ONDANSETRON HCL 4 MG/2ML IJ SOLN: 4.0000 mg | Freq: Four times a day (QID) | INTRAMUSCULAR | Status: DC | PRN

## 2014-11-29 MED ORDER — ASPIRIN EC 81 MG PO TBEC
81.0000 mg | DELAYED_RELEASE_TABLET | Freq: Every day | ORAL | Status: DC
  Administered 2014-11-29 – 2014-11-30 (×2): 81 mg via ORAL
  Filled 2014-11-29 (×2): qty 1

## 2014-11-29 MED ORDER — APIXABAN 2.5 MG PO TABS: 2.5000 mg | ORAL_TABLET | Freq: Two times a day (BID) | ORAL | Status: DC

## 2014-11-29 MED ORDER — ACETAMINOPHEN 325 MG PO TABS: 650.0000 mg | ORAL_TABLET | Freq: Four times a day (QID) | ORAL | Status: DC | PRN

## 2014-11-29 MED ORDER — ROSUVASTATIN CALCIUM 20 MG PO TABS
20.0000 mg | ORAL_TABLET | Freq: Every day | ORAL | Status: DC
  Administered 2014-11-29: 20 mg via ORAL
  Filled 2014-11-29: qty 1

## 2014-11-29 MED ORDER — ONDANSETRON HCL 4 MG PO TABS: 4.0000 mg | ORAL_TABLET | Freq: Four times a day (QID) | ORAL | Status: DC | PRN

## 2014-11-29 MED ORDER — APIXABAN 2.5 MG PO TABS
5.0000 mg | ORAL_TABLET | Freq: Two times a day (BID) | ORAL | Status: DC
  Administered 2014-11-29 – 2014-11-30 (×3): 5 mg via ORAL
  Filled 2014-11-29 (×4): qty 2

## 2014-11-29 MED ORDER — HYDRALAZINE HCL 10 MG PO TABS
10.0000 mg | ORAL_TABLET | Freq: Three times a day (TID) | ORAL | Status: DC
  Administered 2014-11-29 – 2014-11-30 (×3): 10 mg via ORAL
  Filled 2014-11-29 (×3): qty 1

## 2014-11-29 MED ORDER — LEVOTHYROXINE SODIUM 100 MCG PO TABS
100.0000 ug | ORAL_TABLET | Freq: Every day | ORAL | Status: DC
  Administered 2014-11-29 – 2014-11-30 (×2): 100 ug via ORAL
  Filled 2014-11-29 (×2): qty 1

## 2014-11-29 NOTE — Progress Notes (Signed)
ANTICOAGULATION CONSULT NOTE - Initial Consult  Pharmacy Consult for Apixaban Indication: pulmonary embolus  Allergies  Allergen Reactions  . Penicillins Other (See Comments)    Throat felt tight    Patient Measurements: Height: 5\' 8"  (172.7 cm) Weight: 216 lb 11.4 oz (98.3 kg) IBW/kg (Calculated) : 63.9 Heparin Dosing Weight:   Vital Signs: Temp: 98.3 F (36.8 C) (04/16 0509) Temp Source: Oral (04/16 0509) BP: 144/67 mmHg (04/16 0509) Pulse Rate: 68 (04/16 0509)  Labs:  Recent Labs  11/28/14 1952 11/29/14 0140  HGB 12.7  --   HCT 39.4  --   PLT 200  --   CREATININE 1.90*  --   TROPONINI 0.04* 0.04*    Estimated Creatinine Clearance: 26.6 mL/min (by C-G formula based on Cr of 1.9).   Medical History: Past Medical History  Diagnosis Date  . Hypertension   . Diabetes mellitus without complication   . Hyperlipidemia   . Urinary incontinence   . Hypothyroidism   . Arthritis     bilateral knees  . Pulmonary embolism   . Edema   . CHF (congestive heart failure)     Medications:  Scheduled:  . [START ON 12/15/2014] apixaban  2.5 mg Oral BID  . apixaban  5 mg Oral BID  . aspirin EC  81 mg Oral Daily  . ciprofloxacin  500 mg Oral Q18H  . feeding supplement (ENSURE ENLIVE)  237 mL Oral BID BM  . insulin aspart  0-9 Units Subcutaneous 4 times per day  . [START ON 11/30/2014] insulin glargine  10 Units Subcutaneous Q2200  . levothyroxine  100 mcg Oral QAC breakfast  . rosuvastatin  20 mg Oral q1800  . sodium chloride  3 mL Intravenous Q12H    Assessment: Patient with apixaban ordered prior to Belk.  Patient with new PE 06/2014, Med rec notes that on 12/15/2014 patient will have completed 6 months of 5mg  bid and planned to change to 2.5mg  bid (per med rec and package insert).  Per Med rec patient has already changed to 2.5mg  bid, spoke with night MD and Dr. Carola Frost to dose 5mg  bid until the planned dose change.  Goal of Therapy:  Apixaban dosed based on  patient weight, dose recomendation and renal function  Monitor platelets by anticoagulation protocol: Yes   Plan:  Apixaban 5mg  po bid, then change to 2.5mg  po bid on 5/2.    Tyler Deis, Shea Stakes Crowford 11/29/2014,5:27 AM

## 2014-11-29 NOTE — Progress Notes (Addendum)
Patient ID: [name], female   DOB: Sep 11, 1927, 81 y.o.   MRN: [id number] [facility] PROGRESS NOTE  Melissa Shannon [id number] DOB: March 28, 1928 DOA: 11/28/2014 PCP: Melissa Chandler, NP  Brief narrative:    81 y.o. female with past medical history of hypertension, diabetes mellitus, hypothyroidism, pulmonary embolism, A. fib, on anticoagulation with apixaban, chronic kidney disease stage 3 who presented to [facility] with generalized weakness and speech difficulty for past 2 days prior to this admission. Family reported on admission that pt has not been herself lately and appeared disoriented.  On admission, pt was hemodynamically stable. CT head and MRI brain did not reveal acute intracranial findings. Her blood owrk revealed creatinine of 1.9 (in 05/2014 Cr 1.86), troponin was 0.04 on admission, 12 lead EKG showed siunus rhythm. She was found to have UTI based on UA. She was started on ciprofloxacin.   Assessment/Plan:    Principal Problem: Aphasia / generalized weakness / Acute encephalopathy - Unclear etiology of difficult speech. CT head and MRI brain did not show acute intracranial findings. Possibly from UTI. - Continue to treat for UTI with Cipro. - TSH WNL. - Obtain PT evaluation  Active Problems: UTI - UA revealed large leukocytes and many bacteria  - Started cipro on admission - Follow up urine culture results   Hypertension, essential  - Lisinopril and lasix on hold because of elevated creatinine but her Cr has been 1.86 about 5 months ago.  - Will start hydralazine 10 mg every 8 hours   Diabetes mellitus type 2, uncontrolled with renal manifestations - Last A1c 8.4 in 09/2014 indicating poor glycemic control - Resumed pt home Lantus - Added sliding scale insulin - Januvia on hold - Appreciate DM coordinator consult   Chronic combined systolic and diastolic CHF - Compensated - Last 2 D ECHO 10/16/2014 with EF 55% - BNP in recent past 1400 range but on this  admission WNL - Lasix on hold due to elevated creatinine  - Daily weight and monitor intake and output - Replete electrolytes as needed  Pulmonary embolus / atrial fibrillation - CHADS vasc score at least 6 - On anticoagulation with apixaban . - HR 75 without beta blocker  Chronic kidney disease, stage 3 - Baseline Cr 1.86 about 5 months ago - On this admission, Cr 1.9 - Trended down with holding lasix and losartan - Follow up BMP tomorrow am  Mild troponin elevation - Likely demand ischemia from acute infection,, CKD - No chest pain - No acute ischemic changes on 12 lead EKG  Hypothyroidism - TSH WNL - Continue levothyroxine  Dyslipidemia - Resume crestor   Protein calorie malnutrition, moderate - Continue nutritional supplementation - Nutrition consulted     DVT Prophylaxis  - on AC with apixaban    Code Status: DNR/DNI Family Communication:  Family not at the bedside  Disposition Plan: needs PT eval; she is on Abx for UTI  IV access:  Peripheral IV  Procedures and diagnostic studies:    Dg Chest 2 View 11/28/2014   No acute pulmonary process.   Electronically Signed   By: Jeb Levering M.D.   On: 11/28/2014 19:50   Ct Head Wo Contrast 11/28/2014  Age related cerebral atrophy, ventriculomegaly and periventricular white matter disease. No acute intracranial findings or mass lesions.  Scattered left-sided sinus disease.   Electronically Signed   By: Marijo Sanes M.D.   On: 11/28/2014 20:07   Mr Brain Wo Contrast 11/28/2014 1. No acute intracranial infarct  or other abnormality identified. 2. Small remote left cerebellar infarcts. 3. Age-related cerebral atrophy with mild chronic small vessel ischemic disease. 4. Left frontoethmoidal and maxillary sinus disease.   Electronically Signed   By: Jeannine Boga M.D.   On: 11/28/2014 23:14    Medical Consultants:  None   Other Consultants:  Physical therapy Nutrition DM coordinator   IAnti-Infectives:    Cipro 11/28/2014 -->    [name], MD  [facility] Pager [phone]  Time spent in minutes: 25 minutes  If 7PM-7AM, please contact night-coverage [email or web address] Password [facility] 11/29/2014, 12:32 PM   LOS: 1 day    HPI/Subjective: No acute overnight events. Pt sleeping this am, awakes easily.   Objective: Filed Vitals:   11/28/14 2200 11/29/14 0013 11/29/14 0509 11/29/14 0655  BP: 146/98 146/94 144/67   Pulse: 81 71 68   Temp:  98.4 F (36.9 C) 98.3 F (36.8 C)   TempSrc:  Oral Oral   Resp: 15 18 20    Height:  5\' 8"  (1.727 m)    Weight:  98.3 kg (216 lb 11.4 oz)  97.5 kg (214 lb 15.2 oz)  SpO2: 97% 98% 98%     Intake/Output Summary (Last 24 hours) at 11/29/14 1232 Last data filed at 11/29/14 1046  Gross per 24 hour  Intake      0 ml  Output   1050 ml  Net  -1050 ml    Exam:   General:  Pt is not in acute distress, sleeping   Cardiovascular: Rate controlled, (+) S1,S2  Respiratory: no wheezing, no rhonchi   Abdomen: non tender, non distended, (+) BS  Extremities: No edema, pulses  palpable bilaterally  Neuro: no focal deficits. Pt sleeping, wakes up briefly but cannot accurately assess if speech difficult   Data Reviewed: Basic Metabolic Panel:  Recent Labs Lab 11/28/14 1952 11/29/14 0650  NA 139 138  K 3.8 3.8  CL 101 102  CO2 27 28  GLUCOSE 119* 129*  BUN 28* 27*  CREATININE 1.90* 1.49*  CALCIUM 10.0 10.1   Liver Function Tests:  Recent Labs Lab 11/28/14 1952 11/29/14 0650  AST 16 18  ALT 14 12  ALKPHOS 76 75  BILITOT 0.7 0.9  PROT 7.0 7.0  ALBUMIN 4.1 3.9   No results for input(s): LIPASE, AMYLASE in the last 168 hours. No results for input(s): AMMONIA in the last 168 hours. CBC:  Recent Labs Lab 11/28/14 1952 11/29/14 0650  WBC 9.3 9.3  NEUTROABS 6.6 5.8  HGB 12.7 13.1  HCT 39.4 40.1  MCV 89.3 89.3  PLT 200 200   Cardiac Enzymes:  Recent Labs Lab 11/28/14 1952 11/29/14 0140 11/29/14 0650  TROPONINI  0.04* 0.04* 0.05*   BNP: Invalid input(s): POCBNP CBG:  Recent Labs Lab 11/28/14 1853 11/29/14 0139 11/29/14 0630  GLUCAP 130* 114* 114*    No results found for this or any previous visit (from the past 240 hour(s)).   Scheduled Meds: .  apixaban  2.5 mg Oral BID  . apixaban  5 mg Oral BID  . aspirin EC  81 mg Oral Daily  . ciprofloxacin  500 mg Oral Q18H  . feeding supplement   237 mL Oral BID BM  . insulin aspart  0-9 Units Subcutaneous 4 times per day  .  insulin glargine  10 Units Subcutaneous Q2200  . levothyroxine  100 mcg Oral QAC breakfast  . rosuvastatin  20 mg Oral q1800

## 2014-11-29 NOTE — Progress Notes (Signed)
ANTIBIOTIC CONSULT NOTE - FOLLOW UP  Pharmacy Consult for Cipro Indication: UTI  Allergies  Allergen Reactions  . Penicillins Other (See Comments)    Throat felt tight    Patient Measurements: Height: 5\' 8"  (172.7 cm) Weight: 214 lb 15.2 oz (97.5 kg) IBW/kg (Calculated) : 63.9  Vital Signs: Temp: 98.2 F (36.8 C) (04/16 1351) Temp Source: Oral (04/16 1351) BP: 145/72 mmHg (04/16 1351) Pulse Rate: 75 (04/16 1351) Intake/Output from previous day: 04/15 0701 - 04/16 0700 In: -  Out: 800 [Urine:800] Intake/Output from this shift: Total I/O In: 480 [P.O.:480] Out: 400 [Urine:400]  Labs:  Recent Labs  11/28/14 1952 11/29/14 0650  WBC 9.3 9.3  HGB 12.7 13.1  PLT 200 200  CREATININE 1.90* 1.49*   Estimated Creatinine Clearance: 33.7 mL/min (by C-G formula based on Cr of 1.49). No results for input(s): VANCOTROUGH, VANCOPEAK, VANCORANDOM, GENTTROUGH, GENTPEAK, GENTRANDOM, TOBRATROUGH, TOBRAPEAK, TOBRARND, AMIKACINPEAK, AMIKACINTROU, AMIKACIN in the last 72 hours.   Assessment: 45 yoF admitted with aphasia, weakness, acute encephalopathy. UA revealed large leukocytes and many bacteria.  Pharmacy consulted to start Cipro.  PO Cipro was started this AM.  [facility] wishes to have Cipro administered IV for now and ordered Cipro 400 mg IV 24h.  SCr has improved since admission and CrCl>30 ml/min so would recommend q12h dosing of Cipro.  4/16 >> Cipro >>   Tmax: AF WBC: wnl  Renal: SCr 1.49 (improved from admission), CrCl~42 ml/min  4/15 Ucx: sent  Goal of Therapy:  Doses adjusted per renal function Eradication of infection  Plan:  1. Adjust Cipro to 400 mg IV q12h.  2. F/u SCr, culture results, clinical course.  Hershal Coria 11/29/2014,3:34 PM

## 2014-11-29 NOTE — Progress Notes (Signed)
UR completed 

## 2014-11-29 NOTE — Progress Notes (Signed)
ANTIBIOTIC CONSULT NOTE - INITIAL  Pharmacy Consult for ciprofloxacin Indication: UTI  Allergies  Allergen Reactions  . Penicillins Other (See Comments)    Throat felt tight    Patient Measurements: Height: 5\' 8"  (172.7 cm) Weight: 216 lb 11.4 oz (98.3 kg) IBW/kg (Calculated) : 63.9 Adjusted Body Weight:   Vital Signs: Temp: 98.3 F (36.8 C) (04/16 0509) Temp Source: Oral (04/16 0509) BP: 144/67 mmHg (04/16 0509) Pulse Rate: 68 (04/16 0509) Intake/Output from previous day: 04/15 0701 - 04/16 0700 In: -  Out: 500 [Urine:500] Intake/Output from this shift: Total I/O In: -  Out: 500 [Urine:500]  Labs:  Recent Labs  11/28/14 1952  WBC 9.3  HGB 12.7  PLT 200  CREATININE 1.90*   Estimated Creatinine Clearance: 26.6 mL/min (by C-G formula based on Cr of 1.9). No results for input(s): VANCOTROUGH, VANCOPEAK, VANCORANDOM, GENTTROUGH, GENTPEAK, GENTRANDOM, TOBRATROUGH, TOBRAPEAK, TOBRARND, AMIKACINPEAK, AMIKACINTROU, AMIKACIN in the last 72 hours.   Microbiology: No results found for this or any previous visit (from the past 720 hour(s)).  Medical History: Past Medical History  Diagnosis Date  . Hypertension   . Diabetes mellitus without complication   . Hyperlipidemia   . Urinary incontinence   . Hypothyroidism   . Arthritis     bilateral knees  . Pulmonary embolism   . Edema   . CHF (congestive heart failure)     Medications:  Anti-infectives    Start     Dose/Rate Route Frequency Ordered Stop   11/29/14 0600  ciprofloxacin (CIPRO) tablet 500 mg     500 mg Oral Every 18 hours 11/29/14 0519     11/28/14 2300  ciprofloxacin (CIPRO) tablet 500 mg     500 mg Oral  Once 11/28/14 2257 11/28/14 2339     Assessment: Patient with UTI. First dose of antibiotics already given.  Patient with poor renal function.  Goal of Therapy:  Cipro dosed based on patient weight and renal function   Plan:  Follow up culture results  Ciprofloxacin 500mg  PO q18hr, next  dose 0600  Tyler Deis, Shea Stakes Crowford 11/29/2014,5:25 AM

## 2014-11-30 LAB — GLUCOSE, CAPILLARY: GLUCOSE-CAPILLARY: 126 mg/dL — AB (ref 70–99)

## 2014-11-30 MED ORDER — FUROSEMIDE 20 MG PO TABS: 20.0000 mg | ORAL_TABLET | Freq: Every day | ORAL | Status: DC

## 2014-11-30 MED ORDER — HYDRALAZINE HCL 10 MG PO TABS: 10.0000 mg | ORAL_TABLET | Freq: Three times a day (TID) | ORAL | Status: DC

## 2014-11-30 MED ORDER — NITROFURANTOIN MONOHYD MACRO 100 MG PO CAPS: 100.0000 mg | ORAL_CAPSULE | Freq: Two times a day (BID) | ORAL | Status: DC

## 2014-11-30 NOTE — Progress Notes (Signed)
Patient informed RN she has an appointment tomorrow for lab work with primary care physician's office and then this upcoming Friday she will see her primary doctor.

## 2014-11-30 NOTE — Evaluation (Signed)
Physical Therapy Evaluation Patient Details Name: Melissa Shannon MRN: [id number] DOB: 07/11/1928 Today's Date: 11/30/2014   History of Present Illness  admit with c/o not feeling herself for past 2 days , weak and not able to find her words as she normally does. No acute findings in the MRI or CT of brain. did find UTI and started treating that.   Clinical Impression  Pt tolerate well, with evident generalized weakness today, however with family and home instead she feels she is at a good level to return home. Recommend HHPT to continue to follow pt to grow strength and walking ability back to her baseline.    Follow Up Recommendations Home health PT    Equipment Recommendations  None recommended by PT    Recommendations for Other Services       Precautions / Restrictions Restrictions Weight Bearing Restrictions: No      Mobility  Bed Mobility Overal bed mobility: Modified Independent             General bed mobility comments: needed increased tiem to get out of bed, however preformed independently  Transfers Overall transfer level: Modified independent Equipment used: Rolling walker (2 wheeled)             General transfer comment: stated her bed was higher than this one so should be easier to get out of bed.   Ambulation/Gait Ambulation/Gait assistance: Supervision Ambulation Distance (Feet): 80 Feet Assistive device: Rolling walker (2 wheeled)   Gait velocity: slow and steady   General Gait Details: step over step, slow ans steady  Stairs            Wheelchair Mobility    Modified Rankin (Stroke Patients Only)       Balance                                             Pertinent Vitals/Pain Pain Assessment: No/denies pain    Home Living Family/patient expects to be discharged to:: Private residence Living Arrangements: Alone Available Help at Discharge: Family;Available PRN/intermittently (also has home instead coming  3 days a week) Type of Home: House Home Access: Stairs to enter Entrance Stairs-Rails: Right Entrance Stairs-Number of Steps: 2 Home Layout: Multi-level Home Equipment: Walker - 2 wheels;Cane - single point      Prior Function Level of Independence: Independent with assistive device(s)         Comments: state she uses the cane and at times teh RW, has canes throughout her house a 2 types of RW for her to use. also one in her car. Pt still drives.      Hand Dominance   Dominant Hand: Right    Extremity/Trunk Assessment               Lower Extremity Assessment: Generalized weakness         Communication   Communication: No difficulties  Cognition Arousal/Alertness: Awake/alert Behavior During Therapy: WFL for tasks assessed/performed Overall Cognitive Status: Within Functional Limits for tasks assessed                      General Comments      Exercises        Assessment/Plan    PT Assessment Patient needs continued PT services  PT Diagnosis Generalized weakness   PT Problem List Decreased strength;Decreased activity tolerance;Decreased mobility  PT Treatment Interventions DME instruction;Gait training;Functional mobility training;Therapeutic activities;Therapeutic exercise;Patient/family education   PT Goals (Current goals can be found in the Care Plan section) Acute Rehab PT Goals Patient Stated Goal: I want to continue to get a little bit straonger sinc eI have been here. I am not used to not feeling well.  PT Goal Formulation: With patient Time For Goal Achievement: 12/14/14 Potential to Achieve Goals: Good    Frequency Min 3X/week   Barriers to discharge        Co-evaluation               End of Session Equipment Utilized During Treatment: Gait belt Activity Tolerance: Patient tolerated treatment well Patient left: in chair Nurse Communication: Mobility status         Time: 0930-1000 PT Time Calculation (min) (ACUTE  ONLY): 30 min   Charges:   PT Evaluation $Initial PT Evaluation Tier I: 1 Procedure PT Treatments $Gait Training: 8-22 mins   PT G CodesClide Dales 12-04-2014, 10:56 AM  Clide Dales, PT Pager: [phone] 12/04/14

## 2014-11-30 NOTE — Care Management Note (Signed)
    Page 1 of 1   11/30/2014     10:49:12 AM CARE MANAGEMENT NOTE 11/30/2014  Patient:  Melissa Shannon, Melissa Shannon   Account Number:  [id number]  Date Initiated:  11/30/2014  Documentation initiated by:  Dessa Phi  Subjective/Objective Assessment:   81 y/o f admitted w/aphasia.     Action/Plan:   from home. Has -3n1,rw,chair lift.   Anticipated DC Date:  11/30/2014   Anticipated DC Plan:  [place]  CM consult      Choice offered to / List presented to:  C-1 Patient        [place] arranged  [place]      [place].   Status of service:  Completed, signed off Medicare Important Message given?  YES (If response is "NO", the following Medicare IM given date fields will be blank) Date Medicare IM given:  11/30/2014 Medicare IM given by:  [facility] Date Additional Medicare IM given:   Additional Medicare IM given by:    Discharge Disposition:  [place]  Per UR Regulation:  Reviewed for med. necessity/level of care/duration of stay  If discussed at [place] of Stay Meetings, dates discussed:    Comments:  11/30/14 Haydee Monica RN BSN NCM [id number] PT-HH. [facility] chosen by patient.TC [facility] rep Lecretia awrae of HHPT order, & d/c.

## 2014-11-30 NOTE — Discharge Instructions (Signed)
Aphasia Aphasia is a neurological disorder caused by damage to the parts of the brain that control language. CAUSES  Aphasia is not a disease, but a symptom of brain damage. Aphasia is commonly seen in adults who have suffered a stroke. Aphasia also can result from:  A brain tumor.  Infection.  Head injury.  A rare type of dementia called Primary Progressive Aphasia. Common types of dementia may be associated with aphasia but can also exist without language problems. SYMPTOMS  Primary signs of the disorder include:  Problems expressing oneself when speaking.  Trouble understanding speech.  Difficulty with reading and writing.  Speaking in short or incomplete sentences.  Speaking in sentences that don't make sense.  Speaking unrecognizable words.  Interpreting figurative language literally.  Writing sentences that don't make sense. The type and severity of language problems depend on the precise location and extent of the damaged brain tissue. Aphasia can be divided into four broad categories:  Expressive aphasia - difficulty in conveying thoughts through speech or writing. The patient knows what they want to say, but cannot find the words they need.  Receptive aphasia - difficulty understanding spoken or written language. The patient hears the voice or sees the print but cannot make sense of the words.  Anomic or amnesia aphasia - difficulty in using the correct names for particular objects, people, places, or events. This is the least severe form of aphasia.  Global aphasia results from severe and extensive damage to the language areas of the brain. Patients lose almost all language function, both comprehension (understanding) and expression. They cannot speak, understand speech, read, or write. TREATMENT  Sometimes an individual will completely recover from aphasia without treatment. In most cases, language therapy should begin as soon as possible. Language therapy should  be tailored to the individual needs of the patient. Therapy with a speech pathologist involves exercises in which patients:  Read.  Write.  Follow directions.  Repeat what they hear.  Computer-aided therapy may also be used. PROGNOSIS  The outcome of aphasia is difficult to predict. People who are younger or have less extensive brain damage do better. The location of the injury is also important. The location is a clue to prognosis. In general, patients tend to recover skills in language comprehension (understanding) more completely than those skills involving expression (speaking or writing). Document Released: 04/23/2002 Document Revised: 10/24/2011 Document Reviewed: 10/21/2013 [facility] Patient Information 2015 [place], [place]. This information is not intended to replace advice given to you by your health care provider. Make sure you discuss any questions you have with your health care provider. Urinary Tract Infection Urinary tract infections (UTIs) can develop anywhere along your urinary tract. Your urinary tract is your body's drainage system for removing wastes and extra water. Your urinary tract includes two kidneys, two ureters, a bladder, and a urethra. Your kidneys are a pair of bean-shaped organs. Each kidney is about the size of your fist. They are located below your ribs, one on each side of your spine. CAUSES Infections are caused by microbes, which are microscopic organisms, including fungi, viruses, and bacteria. These organisms are so small that they can only be seen through a microscope. Bacteria are the microbes that most commonly cause UTIs. SYMPTOMS  Symptoms of UTIs may vary by age and gender of the patient and by the location of the infection. Symptoms in young women typically include a frequent and intense urge to urinate and a painful, burning feeling in the bladder or urethra during  urination. Older women and men are more likely to be tired, shaky, and weak and have  muscle aches and abdominal pain. A fever may mean the infection is in your kidneys. Other symptoms of a kidney infection include pain in your back or sides below the ribs, nausea, and vomiting. DIAGNOSIS To diagnose a UTI, your caregiver will ask you about your symptoms. Your caregiver also will ask to provide a urine sample. The urine sample will be tested for bacteria and white blood cells. White blood cells are made by your body to help fight infection. TREATMENT  Typically, UTIs can be treated with medication. Because most UTIs are caused by a bacterial infection, they usually can be treated with the use of antibiotics. The choice of antibiotic and length of treatment depend on your symptoms and the type of bacteria causing your infection. HOME CARE INSTRUCTIONS  If you were prescribed antibiotics, take them exactly as your caregiver instructs you. Finish the medication even if you feel better after you have only taken some of the medication.  Drink enough water and fluids to keep your urine clear or pale yellow.  Avoid caffeine, tea, and carbonated beverages. They tend to irritate your bladder.  Empty your bladder often. Avoid holding urine for long periods of time.  Empty your bladder before and after sexual intercourse.  After a bowel movement, women should cleanse from front to back. Use each tissue only once. SEEK MEDICAL CARE IF:   You have back pain.  You develop a fever.  Your symptoms do not begin to resolve within 3 days. SEEK IMMEDIATE MEDICAL CARE IF:   You have severe back pain or lower abdominal pain.  You develop chills.  You have nausea or vomiting.  You have continued burning or discomfort with urination. MAKE SURE YOU:   Understand these instructions.  Will watch your condition.  Will get help right away if you are not doing well or get worse. Document Released: 05/11/2005 Document Revised: 01/31/2012 Document Reviewed: 09/09/2011 [facility] Patient  Information 2015 [place], [place]. This information is not intended to replace advice given to you by your health care provider. Make sure you discuss any questions you have with your health care provider.

## 2014-11-30 NOTE — Discharge Summary (Signed)
Physician Discharge Summary  Melissa Shannon [id number] DOB: 12-07-1927 DOA: 11/28/2014  PCP: Lauree Chandler, NP  Admit date: 11/28/2014 Discharge date: 11/30/2014  Recommendations for Outpatient Follow-up:  1. Please note the following change in medications: Please note we reduced lasix to 20 mg daily due to renal insufficiency. We also held lisinopril due to renal insufficiency. For blood pressure control we prescribed hydralazine 10 mg every 8 hours. 2. For urinary tract infection we prescribed Macrobid 100 mg twice a day for 4 days. You were on cipro in hospital.  Discharge Diagnoses:  Principal Problem:   Aphasia Active Problems:   Hypertension   CHF (congestive heart failure)   Pulmonary hypertension   Pulmonary embolus   General weakness   UTI (urinary tract infection)   Acute encephalopathy   Diabetes mellitus with renal manifestations, uncontrolled    Discharge Condition: stable; pt wants to go home today   Diet recommendation: as tolerated   History of present illness:  81 y.o. female with past medical history of hypertension, diabetes mellitus, hypothyroidism, pulmonary embolism, A. fib, on anticoagulation with apixaban, chronic kidney disease stage 3 who presented to [facility] with generalized weakness and speech difficulty for past 2 days prior to this admission. Family reported on admission that pt has not been herself lately and appeared disoriented.  On admission, pt was hemodynamically stable. CT head and MRI brain did not reveal acute intracranial findings. Her blood owrk revealed creatinine of 1.9 (in 05/2014 Cr 1.86), troponin was 0.04 on admission, 12 lead EKG showed siunus rhythm. She was found to have UTI based on UA. She was started on ciprofloxacin.   Assessment/Plan:    Principal Problem: Aphasia / generalized weakness / Acute encephalopathy - Unclear etiology of difficult speech. CT head and MRI brain did not show acute intracranial findings.  Possibly from UTI. - Her speech difficulty resolved, pt much better this am - TSH WNL. - Stable for discharge home today  Active Problems: UTI - UA revealed large leukocytes and many bacteria  - Started cipro on admission; urine culture not collected on admission - D/C with Macrobid for 4 days on discharge   Hypertension, essential  - Lisinopril and lasix were on hold because of elevated creatinine  - Baseline Cr 1.86 about 5 months ago.  - Started and will continue hydralazine 10 mg every 8 hours; lasix reduced to 20 mg daily. Lisinopril on hold even on discharge.   Diabetes mellitus type 2, uncontrolled with renal manifestations - Last A1c 8.4 in 09/2014 indicating poor glycemic control - Continue lantus and januvia on d/c - DM coordinator consulted   Chronic combined systolic and diastolic CHF - Compensated - Last 2 D ECHO 10/16/2014 with EF 55% - BNP in recent past 1400 range but on this admission WNL - Lasix was on hold due to elevated creatinine; she may resume 20 mg daily on discharge  - Potassium WNL on discharge.  Pulmonary embolus / atrial fibrillation - CHADS vasc score at least 6 - On anticoagulation with apixaban .  Chronic kidney disease, stage 3 - Baseline Cr 1.86 about 5 months ago - On this admission, Cr 1.9 - Trended down to 1.49; she may resume lasix but at 20 mg a day   Mild troponin elevation - Likely demand ischemia from acute infection,, CKD - No chest pain - No acute ischemic changes identified on 12 lead EKG  Hypothyroidism - TSH WNL - Continue levothyroxine  Dyslipidemia - Continue crestor   Protein calorie  malnutrition, moderate - Continue nutritional supplementation - Nutrition consulted while pt in hospital     DVT Prophylaxis  - on [employer] with apixaban    Code Status: DNR/DNI Family Communication: Family not at the bedside    IV access:  Peripheral IV  Procedures and diagnostic studies:   Dg Chest 2 View  11/28/2014 No acute pulmonary process. Electronically Signed By: Jeb Levering M.D. On: 11/28/2014 19:50   Ct Head Wo Contrast 11/28/2014 Age related cerebral atrophy, ventriculomegaly and periventricular white matter disease. No acute intracranial findings or mass lesions. Scattered left-sided sinus disease. Electronically Signed By: Marijo Sanes M.D. On: 11/28/2014 20:07   Mr Brain Wo Contrast 11/28/2014 1. No acute intracranial infarct or other abnormality identified. 2. Small remote left cerebellar infarcts. 3. Age-related cerebral atrophy with mild chronic small vessel ischemic disease. 4. Left frontoethmoidal and maxillary sinus disease. Electronically Signed By: Jeannine Boga M.D. On: 11/28/2014 23:14    Medical Consultants:  None   Other Consultants:  Physical therapy Nutrition DM coordinator   IAnti-Infectives:   Cipro 11/28/2014 -->  11/30/2014 Macrobid 4 days on discharge   Signed:  [name], MD  [facility] 11/30/2014, 9:37 AM  Pager #: [phone]  Time spent  Coordinating discharge in minutes: more than 30 minutes   Discharge Exam: Filed Vitals:   11/30/14 0607  BP: 142/66  Pulse: 64  Temp: 98.6 F (37 C)  Resp: 18   Filed Vitals:   11/29/14 1351 11/29/14 1803 11/29/14 2230 11/30/14 0607  BP: 145/72 154/72 142/76 142/66  Pulse: 75 77 72 64  Temp: 98.2 F (36.8 C)  98 F (36.7 C) 98.6 F (37 C)  TempSrc: Oral  Oral Oral  Resp: 18  18 18   Height:      Weight:    97.2 kg (214 lb 4.6 oz)  SpO2: 98% 98% 98% 96%    General: Pt is alert, follows commands appropriately, not in acute distress Cardiovascular: Regular rate and rhythm, S1/S2 +, no murmurs Respiratory: Clear to auscultation bilaterally, no wheezing, no crackles, no rhonchi Abdominal: Soft, non tender, non distended, bowel sounds +, no guarding Extremities: no edema, no cyanosis, pulses palpable bilaterally DP and PT Neuro: Grossly  nonfocal  Discharge Instructions  Discharge Instructions    Call MD for:  difficulty breathing, headache or visual disturbances    Complete by:  As directed      Call MD for:  persistant nausea and vomiting    Complete by:  As directed      Call MD for:  severe uncontrolled pain    Complete by:  As directed      Diet - low sodium heart healthy    Complete by:  As directed      Discharge instructions    Complete by:  As directed   1. Please note the following change in medications: Please note we reduced lasix to 20 mg daily due to renal insufficiency. We also held lisinopril due to renal insufficiency. For blood pressure control we prescribed hydralazine 10 mg every 8 hours. 2. For urinary tract infection we prescribed Macrobid 100 mg twice a day for 4 days. You were on cipro in hospital.     Increase activity slowly    Complete by:  As directed             Medication List    STOP taking these medications        lisinopril 10 MG tablet  Commonly known as:  PRINIVIL,ZESTRIL  TAKE these medications        apixaban 5 MG Tabs tablet  Commonly known as:  ELIQUIS  Take 0.5 tablets (2.5 mg total) by mouth 2 (two) times daily.  Start taking on:  12/15/2014     feeding supplement (ENSURE COMPLETE) Liqd  Take 237 mLs by mouth 2 (two) times daily between meals as needed (As requested by patient).     furosemide 20 MG tablet  Commonly known as:  LASIX  Take 1 tablet (20 mg total) by mouth daily. May take addiitonal 40 mg in the PM for weight 214 lbs. Or greater     hydrALAZINE 10 MG tablet  Commonly known as:  APRESOLINE  Take 1 tablet (10 mg total) by mouth 3 (three) times daily.     Insulin Glargine 100 UNIT/ML Solostar Pen  Commonly known as:  LANTUS SOLOSTAR  Inject 10 Units into the skin daily.     levothyroxine 100 MCG tablet  Commonly known as:  SYNTHROID, LEVOTHROID  Take 1 tablet (100 mcg total) by mouth daily before breakfast. For Hypothyroidism      linagliptin 5 MG Tabs tablet  Commonly known as:  TRADJENTA  Take 1 tablet (5 mg total) by mouth daily.     multivitamin with minerals Tabs tablet  Take 1 tablet by mouth daily.     nitrofurantoin (macrocrystal-monohydrate) 100 MG capsule  Commonly known as:  MACROBID  Take 1 capsule (100 mg total) by mouth 2 (two) times daily.     polyethylene glycol packet  Commonly known as:  MIRALAX / GLYCOLAX  Take 17 g by mouth as needed for mild constipation.     rosuvastatin 20 MG tablet  Commonly known as:  CRESTOR  Take 20 mg by mouth daily. For High Cholesterol     Vitamin D3 5000 UNITS Chew  Chew 1 tablet by mouth daily.           Follow-up Information    Follow up with Dewaine Oats, JESSICA K, NP. Schedule an appointment as soon as possible for a visit in 1 week.   Specialty:  Nurse Practitioner   Why:  Follow up appt after recent hospitalization   Contact information:   [address] [place] [address] [phone]        The results of significant diagnostics from this hospitalization (including imaging, microbiology, ancillary and laboratory) are listed below for reference.    Significant Diagnostic Studies: Dg Chest 2 View  11/28/2014   CLINICAL DATA:  Shortness of breath, weakness, confusion. Symptoms for 1 day.  EXAM: CHEST  2 VIEW  COMPARISON:  Chest radiograph 09/22/2014, CT 09/11/2014  FINDINGS: The cardiomediastinal contours are unchanged, heart size is normal. Mild hyperinflation, unchanged. Pulmonary vasculature is normal. The tubular right middle lobe opacity on prior CT is not well seen radiographically. No consolidation, pleural effusion, or pneumothorax. No acute osseous abnormalities are seen. Probable intra-articular body in the left shoulder joint.  IMPRESSION: No acute pulmonary process.   Electronically Signed   By: Jeb Levering M.D.   On: 11/28/2014 19:50   Ct Head Wo Contrast  11/28/2014   CLINICAL DATA:  Headache, weakness, shortness of breath  and mental status changes since yesterday.  EXAM: CT HEAD WITHOUT CONTRAST  TECHNIQUE: Contiguous axial images were obtained from the base of the skull through the vertex without intravenous contrast.  COMPARISON:  None.  FINDINGS: Age related cerebral atrophy, ventriculomegaly and periventricular white matter disease. No extra-axial fluid collections are identified. No  CT findings for acute hemispheric infarction or intracranial hemorrhage. No mass lesions. The brainstem and cerebellum are normal.  No acute bony findings. Scattered scalp calcifications are noted. The left frontal sinus is opacified. There is also scattered opacification of some of the left-sided ethmoid air cells and the left half of the sphenoid sinus. The mastoid air cells and middle ear cavities are clear.  IMPRESSION: Age related cerebral atrophy, ventriculomegaly and periventricular white matter disease. No acute intracranial findings or mass lesions.  Scattered left-sided sinus disease.   Electronically Signed   By: Marijo Sanes M.D.   On: 11/28/2014 20:07   Mr Brain Wo Contrast  11/28/2014   CLINICAL DATA:  Initial evaluation for acute onset aphasia, headache, weakness. Symptomatic since yesterday.  EXAM: MRI HEAD WITHOUT CONTRAST  TECHNIQUE: Multiplanar, multiecho pulse sequences of the brain and surrounding structures were obtained without intravenous contrast.  COMPARISON:  Prior CT from earlier the same day.  FINDINGS: Mild diffuse prominence of the CSF containing spaces is compatible with generalized cerebral atrophy. Patchy and confluent T2/FLAIR hyperintensity within the periventricular and deep white matter both cerebral hemispheres most consistent with chronic small vessel ischemic disease, fairly mild for patient age. Several small remote infarcts present within the left cerebellar hemisphere.  No abnormal foci of restricted diffusion to suggest acute intracranial infarct. Gray-white matter differentiation maintained. Normal  intravascular flow voids are preserved. No acute or chronic intracranial hemorrhage identified.  No mass lesion or midline shift. No mass effect. There is no hydrocephalus. No extra-axial fluid collection.  Craniocervical junction within normal limits. Pituitary gland normal. No acute abnormality about the orbits. Sequelae of prior cataract extraction noted bilaterally.  Mucoperiosteal thickening present within the maxillary sinuses. The left frontal sinus and anterior left ethmoidal air cells are opacified. No mastoid effusion. Inner ear structures normal.  Mild degenerative changes noted within the upper cervical spine. Bone marrow signal intensity normal. Scalp soft tissues unremarkable.  IMPRESSION: 1. No acute intracranial infarct or other abnormality identified. 2. Small remote left cerebellar infarcts. 3. Age-related cerebral atrophy with mild chronic small vessel ischemic disease. 4. Left frontoethmoidal and maxillary sinus disease.   Electronically Signed   By: Jeannine Boga M.D.   On: 11/28/2014 23:14    Microbiology: No results found for this or any previous visit (from the past 240 hour(s)).   Labs: Basic Metabolic Panel:  Recent Labs Lab 11/28/14 1952 11/29/14 0650  NA 139 138  K 3.8 3.8  CL 101 102  CO2 27 28  GLUCOSE 119* 129*  BUN 28* 27*  CREATININE 1.90* 1.49*  CALCIUM 10.0 10.1   Liver Function Tests:  Recent Labs Lab 11/28/14 1952 11/29/14 0650  AST 16 18  ALT 14 12  ALKPHOS 76 75  BILITOT 0.7 0.9  PROT 7.0 7.0  ALBUMIN 4.1 3.9   No results for input(s): LIPASE, AMYLASE in the last 168 hours. No results for input(s): AMMONIA in the last 168 hours. CBC:  Recent Labs Lab 11/28/14 1952 11/29/14 0650  WBC 9.3 9.3  NEUTROABS 6.6 5.8  HGB 12.7 13.1  HCT 39.4 40.1  MCV 89.3 89.3  PLT 200 200   Cardiac Enzymes:  Recent Labs Lab 11/28/14 1952 11/29/14 0140 11/29/14 0650 11/29/14 1240  TROPONINI 0.04* 0.04* 0.05* 0.04*   BNP: BNP (last 3  results)  Recent Labs  11/28/14 1952  BNP 49.1    ProBNP (last 3 results)  Recent Labs  06/11/14 1814 06/16/14 0418 06/18/14 0400  PROBNP 10167.0* 3540.0*  1415.0*    CBG:  Recent Labs Lab 11/29/14 0630 11/29/14 1205 11/29/14 1656 11/29/14 2205 11/30/14 0602  GLUCAP 114* 106* 129* 103* 126*    Time coordinating discharge: Over 30 minutes

## 2014-11-30 NOTE — Progress Notes (Signed)
Cannot schedule appointment. Due to it being the weekend. Discuss with patient to follow up with scheduling  appointment.

## 2014-12-01 ENCOUNTER — Other Ambulatory Visit: Payer: Medicare Other

## 2014-12-01 ENCOUNTER — Telehealth: Payer: Self-pay

## 2014-12-01 DIAGNOSIS — I10 Essential (primary) hypertension: Secondary | ICD-10-CM

## 2014-12-01 LAB — HEMOGLOBIN A1C
Hgb A1c MFr Bld: 7.3 % — ABNORMAL HIGH (ref 4.8–5.6)
MEAN PLASMA GLUCOSE: 163 mg/dL

## 2014-12-01 NOTE — Telephone Encounter (Signed)
Per Janett Billow- have patient bring in blood sugar log at next appointment  Patient aware and verbalized understanding.

## 2014-12-02 LAB — BASIC METABOLIC PANEL
BUN/Creatinine Ratio: 22 (ref 11–26)
BUN: 37 mg/dL — ABNORMAL HIGH (ref 8–27)
CO2: 22 mmol/L (ref 18–29)
Calcium: 10.3 mg/dL (ref 8.7–10.3)
Chloride: 101 mmol/L (ref 97–108)
Creatinine, Ser: 1.69 mg/dL — ABNORMAL HIGH (ref 0.57–1.00)
GFR, EST AFRICAN AMERICAN: 31 mL/min/{1.73_m2} — AB (ref >59)
GFR, EST NON AFRICAN AMERICAN: 27 mL/min/{1.73_m2} — AB (ref >59)
Glucose: 155 mg/dL — ABNORMAL HIGH (ref 65–99)
Potassium: 4.2 mmol/L (ref 3.5–5.2)
Sodium: 142 mmol/L (ref 134–144)

## 2014-12-02 LAB — URINE CULTURE: Colony Count: >=100000

## 2014-12-03 DIAGNOSIS — N183 Chronic kidney disease, stage 3 (moderate): Secondary | ICD-10-CM | POA: Diagnosis not present

## 2014-12-03 DIAGNOSIS — I272 Other secondary pulmonary hypertension: Secondary | ICD-10-CM | POA: Diagnosis not present

## 2014-12-03 DIAGNOSIS — E1122 Type 2 diabetes mellitus with diabetic chronic kidney disease: Secondary | ICD-10-CM | POA: Diagnosis not present

## 2014-12-03 DIAGNOSIS — N390 Urinary tract infection, site not specified: Secondary | ICD-10-CM | POA: Diagnosis not present

## 2014-12-03 DIAGNOSIS — E46 Unspecified protein-calorie malnutrition: Secondary | ICD-10-CM | POA: Diagnosis not present

## 2014-12-03 DIAGNOSIS — E785 Hyperlipidemia, unspecified: Secondary | ICD-10-CM | POA: Diagnosis not present

## 2014-12-03 DIAGNOSIS — I5042 Chronic combined systolic (congestive) and diastolic (congestive) heart failure: Secondary | ICD-10-CM | POA: Diagnosis not present

## 2014-12-03 DIAGNOSIS — I4891 Unspecified atrial fibrillation: Secondary | ICD-10-CM | POA: Diagnosis not present

## 2014-12-03 DIAGNOSIS — Z794 Long term (current) use of insulin: Secondary | ICD-10-CM | POA: Diagnosis not present

## 2014-12-03 DIAGNOSIS — E1165 Type 2 diabetes mellitus with hyperglycemia: Secondary | ICD-10-CM | POA: Diagnosis not present

## 2014-12-04 DIAGNOSIS — E1122 Type 2 diabetes mellitus with diabetic chronic kidney disease: Secondary | ICD-10-CM | POA: Diagnosis not present

## 2014-12-04 DIAGNOSIS — E46 Unspecified protein-calorie malnutrition: Secondary | ICD-10-CM | POA: Diagnosis not present

## 2014-12-04 DIAGNOSIS — N183 Chronic kidney disease, stage 3 (moderate): Secondary | ICD-10-CM | POA: Diagnosis not present

## 2014-12-04 DIAGNOSIS — E1165 Type 2 diabetes mellitus with hyperglycemia: Secondary | ICD-10-CM | POA: Diagnosis not present

## 2014-12-04 DIAGNOSIS — I5042 Chronic combined systolic (congestive) and diastolic (congestive) heart failure: Secondary | ICD-10-CM | POA: Diagnosis not present

## 2014-12-04 DIAGNOSIS — N390 Urinary tract infection, site not specified: Secondary | ICD-10-CM | POA: Diagnosis not present

## 2014-12-05 ENCOUNTER — Encounter: Payer: Self-pay | Admitting: Internal Medicine

## 2014-12-05 ENCOUNTER — Ambulatory Visit (INDEPENDENT_AMBULATORY_CARE_PROVIDER_SITE_OTHER): Payer: Medicare Other | Admitting: Internal Medicine

## 2014-12-05 VITALS — BP 132/74 | HR 88 | Temp 98.0°F | Resp 20 | Ht 65.0 in | Wt 221.2 lb

## 2014-12-05 DIAGNOSIS — E1122 Type 2 diabetes mellitus with diabetic chronic kidney disease: Secondary | ICD-10-CM | POA: Diagnosis not present

## 2014-12-05 DIAGNOSIS — E039 Hypothyroidism, unspecified: Secondary | ICD-10-CM | POA: Diagnosis not present

## 2014-12-05 DIAGNOSIS — I5032 Chronic diastolic (congestive) heart failure: Secondary | ICD-10-CM | POA: Diagnosis not present

## 2014-12-05 DIAGNOSIS — I2699 Other pulmonary embolism without acute cor pulmonale: Secondary | ICD-10-CM

## 2014-12-05 DIAGNOSIS — N189 Chronic kidney disease, unspecified: Secondary | ICD-10-CM | POA: Diagnosis not present

## 2014-12-05 DIAGNOSIS — I10 Essential (primary) hypertension: Secondary | ICD-10-CM

## 2014-12-05 NOTE — Progress Notes (Signed)
Patient ID: Melissa Shannon, female   DOB: 1927/11/17, 81 y.o.   MRN: [id number]    Facility  [facility]    Place of Service:   OFFICE    Allergies  Allergen Reactions  . Penicillins Other (See Comments)    Throat felt tight    Chief Complaint  Patient presents with  . Hospitalization Follow-up    3 month follow-up, discuss labs ( copy printed)    HPI:  81 yo female seen today for f/u. Melissa Shannon was admitted to the hospital for aphasia. Her w/u was neg including no acute changes on MRI brain. Lasix was reduced due to renal insufficiency and Melissa Shannon was started on hydralazine for better BP control. Melissa Shannon was given nitrofurantoin BID x 4 days for UTI  Melissa Shannon has arthritis and has difficulty grasping objects. Increased joint stiffness. Uses walker at home along with cane. D/c'd with [facility] PT which is helping. Melissa Shannon is using a cane today  Checks BS at home every other day, fasting, 140-160s. No low BS reactions. Melissa Shannon sees diabetic educator and was started on lantus. Also taking tradjenta. Melissa Shannon has occasional "sensations" in feet but no overt paresthesias.  BP stable on meds.  Taking levothyroxine for thyroid d/o.  Melissa Shannon takes eliquis for PE dx in November 2015. Melissa Shannon is seeing Dr Sherene Sires and at her Feb appt Melissa Shannon was told to stay on med until her f/u in May. Melissa Shannon will have imaging completed including V/Q scan and CXR, repeat venous doppler to ensure that DVT and PE resolved.   Past Medical History  Diagnosis Date  . Hypertension   . Diabetes mellitus without complication   . Hyperlipidemia   . Urinary incontinence   . Hypothyroidism   . Arthritis     bilateral knees  . Pulmonary embolism   . Edema   . CHF (congestive heart failure)    Past Surgical History  Procedure Laterality Date  . Cholecystectomy    . Tonsillectomy and adenoidectomy  1937    Dr.Brewer  . Pilonidal cyst excision  1952    [facility]   . Eye surgery      bilateral cataracts  . Right elbow  1999    x 2, still has one piece of metal  in it  . Bladder surgery  2000    Dr.Tannerbaum (Bladder Tact)  . Cataract extraction  03/2013    Dr.Shapiro   History   Social History  . Marital Status: Widowed    Spouse Name: N/A  . Number of Children: N/A  . Years of Education: N/A   Occupational History  . Retired Runner, broadcasting/film/video    Social History Main Topics  . Smoking status: Never Smoker   . Smokeless tobacco: Never Used  . Alcohol Use: No  . Drug Use: No  . Sexual Activity: No   Other Topics Concern  . None   Social History Narrative   Diet: Low sodium    Do you drink/eat things with caffeine? Yes, tea   Widowed, married 1952   Lives in a house, yes- 1 or more stories, 1 person in home. No pets    Current/past profession- Teacher    Patient exercises with walker twice daily            Medications: Patient's Medications  New Prescriptions   No medications on file  Previous Medications   APIXABAN (ELIQUIS) 5 MG TABS TABLET    Take 0.5 tablets (2.5 mg total) by mouth 2 (two) times daily.  CHOLECALCIFEROL (VITAMIN D3) 5000 UNITS CHEW    Chew 1 tablet by mouth daily.   FEEDING SUPPLEMENT, ENSURE COMPLETE, (ENSURE COMPLETE) LIQD    Take 237 mLs by mouth 2 (two) times daily between meals as needed (As requested by patient).   FUROSEMIDE (LASIX) 20 MG TABLET    Take 1 tablet (20 mg total) by mouth daily. May take addiitonal 40 mg in the PM for weight 214 lbs. Or greater   HYDRALAZINE (APRESOLINE) 10 MG TABLET    Take 1 tablet (10 mg total) by mouth 3 (three) times daily.   INSULIN GLARGINE (LANTUS SOLOSTAR) 100 UNIT/ML SOLOSTAR PEN    Inject 10 Units into the skin daily.   LEVOTHYROXINE (SYNTHROID, LEVOTHROID) 100 MCG TABLET    Take 1 tablet (100 mcg total) by mouth daily before breakfast. For Hypothyroidism   LINAGLIPTIN (TRADJENTA) 5 MG TABS TABLET    Take 1 tablet (5 mg total) by mouth daily.   LISINOPRIL (PRINIVIL,ZESTRIL) 10 MG TABLET       MULTIPLE VITAMIN (MULTIVITAMIN WITH MINERALS) TABS TABLET    Take 1 tablet  by mouth daily.   NITROFURANTOIN, MACROCRYSTAL-MONOHYDRATE, (MACROBID) 100 MG CAPSULE    Take 1 capsule (100 mg total) by mouth 2 (two) times daily.   POLYETHYLENE GLYCOL (MIRALAX / GLYCOLAX) PACKET    Take 17 g by mouth as needed for mild constipation.    ROSUVASTATIN (CRESTOR) 20 MG TABLET    Take 20 mg by mouth daily. For High Cholesterol  Modified Medications   No medications on file  Discontinued Medications   No medications on file     Review of Systems  Constitutional: Negative for fever, chills, diaphoresis, activity change, appetite change and fatigue.  HENT: Negative for ear pain and sore throat.   Eyes: Negative for visual disturbance.  Respiratory: Negative for cough, chest tightness and shortness of breath.   Cardiovascular: Negative for chest pain, palpitations and leg swelling.  Gastrointestinal: Negative for nausea, vomiting, abdominal pain, diarrhea, constipation and blood in stool.  Genitourinary: Negative for dysuria.  Musculoskeletal: Positive for joint swelling, arthralgias and gait problem.  Neurological: Positive for weakness. Negative for dizziness, tremors, speech difficulty, numbness and headaches.  Psychiatric/Behavioral: Negative for sleep disturbance. The patient is not nervous/anxious.     Filed Vitals:   12/05/14 0918 12/05/14 1032  BP: 142/76 132/74  Pulse: 88   Temp: 98 F (36.7 C)   TempSrc: Oral   Resp: 20   Height: $Remove'5\' 5"'aZGiweE$  (1.651 m)   Weight: 221 lb 3.2 oz (100.336 kg)   SpO2: 94%    Body mass index is 36.81 kg/(m^2).  Physical Exam  Constitutional: Melissa Shannon is oriented to person, place, and time. Melissa Shannon appears well-developed and well-nourished.  HENT:  Mouth/Throat: Oropharynx is clear and moist. No oropharyngeal exudate.  Eyes: Pupils are equal, round, and reactive to light. No scleral icterus.  Neck: Neck supple. No tracheal deviation present. No thyromegaly present.  Cardiovascular: Normal rate, regular rhythm and intact distal pulses.  Exam  reveals no gallop and no friction rub.   Murmur (1/6 SEM) heard. No LE edema b/l. no calf TTP. No carotid bruit b/l  Pulmonary/Chest: Effort normal and breath sounds normal. No stridor. No respiratory distress. Melissa Shannon has no wheezes. Melissa Shannon has no rales.  Abdominal: Soft. Bowel sounds are normal. Melissa Shannon exhibits no distension and no mass. There is no tenderness. There is no rebound and no guarding.  Musculoskeletal: Melissa Shannon exhibits edema and tenderness.  Gait antalgic  Lymphadenopathy:  Melissa Shannon has no cervical adenopathy.  Neurological: Melissa Shannon is alert and oriented to person, place, and time. Melissa Shannon has normal reflexes.  Monofilament testing intact b/l. No foot lesions.  Skin: Skin is warm and dry. No rash noted.  Psychiatric: Melissa Shannon has a normal mood and affect. Her behavior is normal. Judgment and thought content normal.     Labs reviewed: Lab on 12/01/2014  Component Date Value Ref Range Status  . Glucose 12/01/2014 155* 65 - 99 mg/dL Final  . BUN 12/01/2014 37* 8 - 27 mg/dL Final  . Creatinine, Ser 12/01/2014 1.69* 0.57 - 1.00 mg/dL Final  . GFR calc non Af Amer 12/01/2014 27* >59 mL/min/1.73 Final  . GFR calc Af Amer 12/01/2014 31* >59 mL/min/1.73 Final  . BUN/Creatinine Ratio 12/01/2014 22  11 - 26 Final  . Sodium 12/01/2014 142  134 - 144 mmol/L Final  . Potassium 12/01/2014 4.2  3.5 - 5.2 mmol/L Final  . Chloride 12/01/2014 101  97 - 108 mmol/L Final  . CO2 12/01/2014 22  18 - 29 mmol/L Final  . Calcium 12/01/2014 10.3  8.7 - 10.3 mg/dL Final  Admission on 11/28/2014, Discharged on 11/30/2014  Component Date Value Ref Range Status  . Glucose-Capillary 11/28/2014 130* 70 - 99 mg/dL Final  . Sodium 11/28/2014 139  135 - 145 mmol/L Final  . Potassium 11/28/2014 3.8  3.5 - 5.1 mmol/L Final  . Chloride 11/28/2014 101  96 - 112 mmol/L Final  . CO2 11/28/2014 27  19 - 32 mmol/L Final  . Glucose, Bld 11/28/2014 119* 70 - 99 mg/dL Final  . BUN 11/28/2014 28* 6 - 23 mg/dL Final  . Creatinine, Ser  11/28/2014 1.90* 0.50 - 1.10 mg/dL Final  . Calcium 11/28/2014 10.0  8.4 - 10.5 mg/dL Final  . Total Protein 11/28/2014 7.0  6.0 - 8.3 g/dL Final  . Albumin 11/28/2014 4.1  3.5 - 5.2 g/dL Final  . AST 11/28/2014 16  0 - 37 U/L Final  . ALT 11/28/2014 14  0 - 35 U/L Final  . Alkaline Phosphatase 11/28/2014 76  39 - 117 U/L Final  . Total Bilirubin 11/28/2014 0.7  0.3 - 1.2 mg/dL Final  . GFR calc non Af Amer 11/28/2014 23* >90 mL/min Final  . GFR calc Af Amer 11/28/2014 27* >90 mL/min Final   Comment: (NOTE) The eGFR has been calculated using the CKD EPI equation. This calculation has not been validated in all clinical situations. eGFR's persistently <90 mL/min signify possible Chronic Kidney Disease.   . Anion gap 11/28/2014 11  5 - 15 Final  . WBC 11/28/2014 9.3  4.0 - 10.5 K/uL Final  . RBC 11/28/2014 4.41  3.87 - 5.11 MIL/uL Final  . Hemoglobin 11/28/2014 12.7  12.0 - 15.0 g/dL Final  . HCT 11/28/2014 39.4  36.0 - 46.0 % Final  . MCV 11/28/2014 89.3  78.0 - 100.0 fL Final  . MCH 11/28/2014 28.8  26.0 - 34.0 pg Final  . MCHC 11/28/2014 32.2  30.0 - 36.0 g/dL Final  . RDW 11/28/2014 13.5  11.5 - 15.5 % Final  . Platelets 11/28/2014 200  150 - 400 K/uL Final  . Neutrophils Relative % 11/28/2014 70  43 - 77 % Final  . Neutro Abs 11/28/2014 6.6  1.7 - 7.7 K/uL Final  . Lymphocytes Relative 11/28/2014 19  12 - 46 % Final  . Lymphs Abs 11/28/2014 1.7  0.7 - 4.0 K/uL Final  . Monocytes Relative 11/28/2014 10  3 - 12 %  Final  . Monocytes Absolute 11/28/2014 0.9  0.1 - 1.0 K/uL Final  . Eosinophils Relative 11/28/2014 1  0 - 5 % Final  . Eosinophils Absolute 11/28/2014 0.1  0.0 - 0.7 K/uL Final  . Basophils Relative 11/28/2014 0  0 - 1 % Final  . Basophils Absolute 11/28/2014 0.0  0.0 - 0.1 K/uL Final  . Color, Urine 11/28/2014 YELLOW  YELLOW Final  . APPearance 11/28/2014 CLOUDY* CLEAR Final  . Specific Gravity, Urine 11/28/2014 1.017  1.005 - 1.030 Final  . pH 11/28/2014 6.0  5.0 -  8.0 Final  . Glucose, UA 11/28/2014 NEGATIVE  NEGATIVE mg/dL Final  . Hgb urine dipstick 11/28/2014 NEGATIVE  NEGATIVE Final  . Bilirubin Urine 11/28/2014 NEGATIVE  NEGATIVE Final  . Ketones, ur 11/28/2014 NEGATIVE  NEGATIVE mg/dL Final  . Protein, ur 11/28/2014 NEGATIVE  NEGATIVE mg/dL Final  . Urobilinogen, UA 11/28/2014 0.2  0.0 - 1.0 mg/dL Final  . Nitrite 11/28/2014 POSITIVE* NEGATIVE Final  . Leukocytes, UA 11/28/2014 LARGE* NEGATIVE Final  . Troponin I 11/28/2014 0.04* <0.031 ng/mL Final   Comment:        PERSISTENTLY INCREASED TROPONIN VALUES IN THE RANGE OF 0.04-0.49 ng/mL CAN BE SEEN IN:       -UNSTABLE ANGINA       -CONGESTIVE HEART FAILURE       -MYOCARDITIS       -CHEST TRAUMA       -ARRYHTHMIAS       -LATE PRESENTING MYOCARDIAL INFARCTION       -COPD   CLINICAL FOLLOW-UP RECOMMENDED.   . B Natriuretic Peptide 11/28/2014 49.1  0.0 - 100.0 pg/mL Final  . Squamous Epithelial / LPF 11/28/2014 RARE  RARE Final  . WBC, UA 11/28/2014 21-50  <3 WBC/hpf Final   WBC CLUMPS  . Bacteria, UA 11/28/2014 MANY* RARE Final  . Specimen Description 11/28/2014 URINE, CLEAN CATCH   Final  . Special Requests 11/28/2014 NONE   Final  . Colony Count 11/28/2014    Final                   Value:>=100,000 COLONIES/ML Performed at [facility]   . Culture 11/28/2014    Final                   Value:ESCHERICHIA COLI Performed at [facility]   . Report Status 11/28/2014 12/02/2014 FINAL   Final  . Organism ID, Bacteria 11/28/2014 ESCHERICHIA COLI   Final  . Troponin I 11/29/2014 0.04* <0.031 ng/mL Final   Comment:        PERSISTENTLY INCREASED TROPONIN VALUES IN THE RANGE OF 0.04-0.49 ng/mL CAN BE SEEN IN:       -UNSTABLE ANGINA       -CONGESTIVE HEART FAILURE       -MYOCARDITIS       -CHEST TRAUMA       -ARRYHTHMIAS       -LATE PRESENTING MYOCARDIAL INFARCTION       -COPD   CLINICAL FOLLOW-UP RECOMMENDED.   Marland Kitchen Troponin I 11/29/2014 0.05* <0.031 ng/mL Final    Comment:        PERSISTENTLY INCREASED TROPONIN VALUES IN THE RANGE OF 0.04-0.49 ng/mL CAN BE SEEN IN:       -UNSTABLE ANGINA       -CONGESTIVE HEART FAILURE       -MYOCARDITIS       -CHEST TRAUMA       -ARRYHTHMIAS       -  LATE PRESENTING MYOCARDIAL INFARCTION       -COPD   CLINICAL FOLLOW-UP RECOMMENDED.   Marland Kitchen Troponin I 11/29/2014 0.04* <0.031 ng/mL Final   Comment:        PERSISTENTLY INCREASED TROPONIN VALUES IN THE RANGE OF 0.04-0.49 ng/mL CAN BE SEEN IN:       -UNSTABLE ANGINA       -CONGESTIVE HEART FAILURE       -MYOCARDITIS       -CHEST TRAUMA       -ARRYHTHMIAS       -LATE PRESENTING MYOCARDIAL INFARCTION       -COPD   CLINICAL FOLLOW-UP RECOMMENDED.   . TSH 11/29/2014 0.737  0.350 - 4.500 uIU/mL Final  . Hgb A1c MFr Bld 11/29/2014 7.3* 4.8 - 5.6 % Final   Comment: (NOTE)         Pre-diabetes: 5.7 - 6.4         Diabetes: >6.4         Glycemic control for adults with diabetes: <7.0   . Mean Plasma Glucose 11/29/2014 163   Final   Comment: (NOTE) Performed At: [facility] [place], [place] [id number] Lindon Romp MD [id number]   . WBC 11/29/2014 9.3  4.0 - 10.5 K/uL Final  . RBC 11/29/2014 4.49  3.87 - 5.11 MIL/uL Final  . Hemoglobin 11/29/2014 13.1  12.0 - 15.0 g/dL Final  . HCT 11/29/2014 40.1  36.0 - 46.0 % Final  . MCV 11/29/2014 89.3  78.0 - 100.0 fL Final  . MCH 11/29/2014 29.2  26.0 - 34.0 pg Final  . MCHC 11/29/2014 32.7  30.0 - 36.0 g/dL Final  . RDW 11/29/2014 13.5  11.5 - 15.5 % Final  . Platelets 11/29/2014 200  150 - 400 K/uL Final  . Neutrophils Relative % 11/29/2014 63  43 - 77 % Final  . Neutro Abs 11/29/2014 5.8  1.7 - 7.7 K/uL Final  . Lymphocytes Relative 11/29/2014 25  12 - 46 % Final  . Lymphs Abs 11/29/2014 2.3  0.7 - 4.0 K/uL Final  . Monocytes Relative 11/29/2014 10  3 - 12 % Final  . Monocytes Absolute 11/29/2014 0.9  0.1 - 1.0 K/uL Final  . Eosinophils Relative 11/29/2014 2  0 - 5 % Final  .  Eosinophils Absolute 11/29/2014 0.1  0.0 - 0.7 K/uL Final  . Basophils Relative 11/29/2014 0  0 - 1 % Final  . Basophils Absolute 11/29/2014 0.0  0.0 - 0.1 K/uL Final  . aPTT 11/29/2014 32  24 - 37 seconds Final  . Prothrombin Time 11/29/2014 14.2  11.6 - 15.2 seconds Final  . INR 11/29/2014 1.09  0.00 - 1.49 Final  . Sodium 11/29/2014 138  135 - 145 mmol/L Final  . Potassium 11/29/2014 3.8  3.5 - 5.1 mmol/L Final  . Chloride 11/29/2014 102  96 - 112 mmol/L Final  . CO2 11/29/2014 28  19 - 32 mmol/L Final  . Glucose, Bld 11/29/2014 129* 70 - 99 mg/dL Final  . BUN 11/29/2014 27* 6 - 23 mg/dL Final  . Creatinine, Ser 11/29/2014 1.49* 0.50 - 1.10 mg/dL Final  . Calcium 11/29/2014 10.1  8.4 - 10.5 mg/dL Final  . Total Protein 11/29/2014 7.0  6.0 - 8.3 g/dL Final  . Albumin 11/29/2014 3.9  3.5 - 5.2 g/dL Final  . AST 11/29/2014 18  0 - 37 U/L Final  . ALT 11/29/2014 12  0 - 35 U/L Final  . Alkaline Phosphatase 11/29/2014  75  39 - 117 U/L Final  . Total Bilirubin 11/29/2014 0.9  0.3 - 1.2 mg/dL Final  . GFR calc non Af Amer 11/29/2014 31* >90 mL/min Final  . GFR calc Af Amer 11/29/2014 36* >90 mL/min Final   Comment: (NOTE) The eGFR has been calculated using the CKD EPI equation. This calculation has not been validated in all clinical situations. eGFR's persistently <90 mL/min signify possible Chronic Kidney Disease.   . Anion gap 11/29/2014 8  5 - 15 Final  . Glucose-Capillary 11/29/2014 114* 70 - 99 mg/dL Final  . Glucose-Capillary 11/29/2014 114* 70 - 99 mg/dL Final  . Glucose-Capillary 11/29/2014 106* 70 - 99 mg/dL Final  . Comment 1 11/29/2014 Notify RN   Final  . Comment 2 11/29/2014 Document in Chart   Final  . Glucose-Capillary 11/29/2014 129* 70 - 99 mg/dL Final  . Comment 1 11/29/2014 Notify RN   Final  . Comment 2 11/29/2014 Document in Chart   Final  . Glucose-Capillary 11/29/2014 103* 70 - 99 mg/dL Final  . Comment 1 11/29/2014 Notify RN   Final  . Comment 2 11/29/2014  Document in Chart   Final  . Glucose-Capillary 11/30/2014 126* 70 - 99 mg/dL Final  Appointment on 10/10/2014  Component Date Value Ref Range Status  . Hgb A1c MFr Bld 10/10/2014 8.4* 4.8 - 5.6 % Final   Comment:          Pre-diabetes: 5.7 - 6.4          Diabetes: >6.4          Glycemic control for adults with diabetes: <7.0   . Est. average glucose Bld gHb Est-m* 10/10/2014 194   Final  . Glucose 10/10/2014 201* 65 - 99 mg/dL Final  . BUN 10/10/2014 37* 8 - 27 mg/dL Final  . Creatinine, Ser 10/10/2014 1.62* 0.57 - 1.00 mg/dL Final  . GFR calc non Af Amer 10/10/2014 29* >59 mL/min/1.73 Final  . GFR calc Af Amer 10/10/2014 33* >59 mL/min/1.73 Final  . BUN/Creatinine Ratio 10/10/2014 23  11 - 26 Final  . Sodium 10/10/2014 140  134 - 144 mmol/L Final  . Potassium 10/10/2014 4.3  3.5 - 5.2 mmol/L Final  . Chloride 10/10/2014 98  97 - 108 mmol/L Final  . CO2 10/10/2014 25  18 - 29 mmol/L Final  . Calcium 10/10/2014 10.5* 8.7 - 10.3 mg/dL Final  . Total Protein 10/10/2014 6.5  6.0 - 8.5 g/dL Final  . Albumin 10/10/2014 3.9  3.5 - 4.7 g/dL Final  . Globulin, Total 10/10/2014 2.6  1.5 - 4.5 g/dL Final  . Albumin/Globulin Ratio 10/10/2014 1.5  1.1 - 2.5 Final  . Bilirubin Total 10/10/2014 0.4  0.0 - 1.2 mg/dL Final  . Alkaline Phosphatase 10/10/2014 89  39 - 117 IU/L Final  . AST 10/10/2014 11  0 - 40 IU/L Final  . ALT 10/10/2014 14  0 - 32 IU/L Final  . Cholesterol, Total 10/10/2014 178  100 - 199 mg/dL Final  . Triglycerides 10/10/2014 198* 0 - 149 mg/dL Final  . HDL 10/10/2014 66  >39 mg/dL Final   Comment: According to ATP-III Guidelines, HDL-C >59 mg/dL is considered a negative risk factor for CHD.   Marland Kitchen VLDL Cholesterol Cal 10/10/2014 40  5 - 40 mg/dL Final  . LDL Calculated 10/10/2014 72  0 - 99 mg/dL Final  . Chol/HDL Ratio 10/10/2014 2.7  0.0 - 4.4 ratio units Final   Comment:  T. Chol/HDL Ratio                                             Men   Women                               1/2 Avg.Risk  3.4    3.3                                   Avg.Risk  5.0    4.4                                2X Avg.Risk  9.6    7.1                                3X Avg.Risk 23.4   11.0    Hospital records reviewed - Her w/u was neg including no acute changes on MRI brain. Lasix was reduced due to renal insufficiency and Melissa Shannon was started on hydralazine for better BP control. Melissa Shannon was given nitrofurantoin BID x 4 days for UTI  Assessment/Plan    ICD-9-CM ICD-10-CM   1. Essential hypertension - stable; cont current meds 401.9 I10   2. Type 2 diabetes mellitus with diabetic chronic kidney disease - improved control; cont current meds 250.40 E11.22 Microalbumin/Creatinine Ratio, Urine   585.9 N18.9   3. Chronic diastolic congestive heart failure - stable on meds 428.32 I50.32    428.0    4. Hypothyroidism, unspecified hypothyroidism type - stable on med 244.9 E03.9   5. Pulmonary embolus with hx DVT - on eliquis 415.19 I26.99    --Weigh in the morning every day. Take extra dose of lasix if weight > 2lbs heavier  --Resume eliquis  --Continue other medications as ordered  --Follow up with Dr Melvyn Novas in May; keep appt with Cardio  --Routine visit with Janett Billow in 3 mos   Melissa Shannon  [facility] [address] [place], [place] [address] [phone] Office (Wednesdays and Fridays 8 AM - 5 PM) [phone] Cell (Monday-Friday 8 AM - 5 PM)

## 2014-12-05 NOTE — Patient Instructions (Signed)
Weigh in the morning every day. Take extra dose of lasix if weight > 2lbs heavier  Resume eliquis  Continue other medications as ordered  Follow up with Dr Melvyn Novas in May   Routine visit with Janett Billow in 3 mos

## 2014-12-06 LAB — MICROALBUMIN / CREATININE URINE RATIO
Creatinine, Urine: 111.4 mg/dL (ref 15.0–278.0)
MICROALB/CREAT RATIO: 18.9 mg/g{creat} (ref 0.0–30.0)
MICROALBUM., U, RANDOM: 21 ug/mL — AB (ref 0.0–17.0)

## 2014-12-08 ENCOUNTER — Encounter: Payer: Self-pay | Admitting: Pharmacotherapy

## 2014-12-08 ENCOUNTER — Ambulatory Visit (INDEPENDENT_AMBULATORY_CARE_PROVIDER_SITE_OTHER): Payer: Medicare Other | Admitting: Pharmacotherapy

## 2014-12-08 VITALS — BP 132/88 | HR 80 | Temp 98.3°F | Ht 65.0 in | Wt 222.5 lb

## 2014-12-08 DIAGNOSIS — N189 Chronic kidney disease, unspecified: Secondary | ICD-10-CM | POA: Diagnosis not present

## 2014-12-08 DIAGNOSIS — E1122 Type 2 diabetes mellitus with diabetic chronic kidney disease: Secondary | ICD-10-CM

## 2014-12-08 DIAGNOSIS — E119 Type 2 diabetes mellitus without complications: Secondary | ICD-10-CM

## 2014-12-08 DIAGNOSIS — I10 Essential (primary) hypertension: Secondary | ICD-10-CM | POA: Diagnosis not present

## 2014-12-08 MED ORDER — INSULIN GLARGINE 100 UNIT/ML SOLOSTAR PEN: 12.0000 [IU] | PEN_INJECTOR | Freq: Every day | SUBCUTANEOUS | Status: DC

## 2014-12-08 NOTE — Patient Instructions (Signed)
Increase Lantus 12 units daily. A1C improved to 7.3%

## 2014-12-08 NOTE — Progress Notes (Signed)
  Subjective:    Melissa Shannon is a 81 y.o. white  female who presents for follow-up of Type 2 diabetes mellitus.   Was started on Lantus 6 weeks ago. Continues 10 units of insulin. Was in the hospital with a UTI / nephritis. Feeling better now. She is trying to make healthy food choices. She is doing stretches and leg raises for exercise .  Has limited mobility.  BG:  145-263m/dl No hypoglycemia.  Has extensive peripheral edema. Denies problems with feet. Wears glasses.  Denies problems with vision. Nocturia 2-3 times per night.   Review of Systems A comprehensive review of systems was negative except for: Eyes: positive for contacts/glasses Cardiovascular: positive for lower extremity edema Genitourinary: positive for nocturia    Objective:    BP 132/88 mmHg  Pulse 80  Temp(Src) 98.3 F (36.8 C) (Oral)  Ht 5' 5"$  (1.651 m)  Wt 222 lb 8 oz (100.925 kg)  BMI 37.03 kg/m2  SpO2 92%  General:  alert, cooperative and no distress  Oropharynx: normal findings: lips normal without lesions and gums healthy   Eyes:  negative findings: lids and lashes normal and conjunctivae and sclerae normal   Ears:  external ears normal        Lung: clear to auscultation bilaterally  Heart:  regular rate and rhythm     Extremities: edema bilateral lower extremitites  Skin: dry     Neuro: mental status, speech normal, alert and oriented x3 and ambulates with walker   Lab Review GLUCOSE (mg/dL)  Date Value  12/01/2014 155*  10/10/2014 201*  09/01/2014 192*   GLUCOSE, BLD (mg/dL)  Date Value  11/29/2014 129*  11/28/2014 119*  07/31/2014 193*   CO2 (mmol/L)  Date Value  12/01/2014 22  11/29/2014 28  11/28/2014 27   BUN (mg/dL)  Date Value  12/01/2014 37*  11/29/2014 27*  11/28/2014 28*  10/10/2014 37*  09/01/2014 27  07/31/2014 32*   CREATININE, SER (mg/dL)  Date Value  12/01/2014 1.69*  11/29/2014 1.49*  11/28/2014 1.90*       Assessment:    Diabetes  Mellitus type II, under good control. A1C much improved. BP at goal <140/90   Plan:    1.  Rx changes: Increase Lantus 12 units daily due to high fasting BG.  2.  Continue Tradjenta 521mdaily. 3.  Counseled on nutrition goals. 4.  Discussed exercise options.  Goal is 30-45 minutes 5 x week. 5.  BP at goal <140/90

## 2014-12-10 DIAGNOSIS — E1122 Type 2 diabetes mellitus with diabetic chronic kidney disease: Secondary | ICD-10-CM | POA: Diagnosis not present

## 2014-12-10 DIAGNOSIS — N183 Chronic kidney disease, stage 3 (moderate): Secondary | ICD-10-CM | POA: Diagnosis not present

## 2014-12-10 DIAGNOSIS — N390 Urinary tract infection, site not specified: Secondary | ICD-10-CM | POA: Diagnosis not present

## 2014-12-10 DIAGNOSIS — E1165 Type 2 diabetes mellitus with hyperglycemia: Secondary | ICD-10-CM | POA: Diagnosis not present

## 2014-12-10 DIAGNOSIS — E46 Unspecified protein-calorie malnutrition: Secondary | ICD-10-CM | POA: Diagnosis not present

## 2014-12-10 DIAGNOSIS — I5042 Chronic combined systolic (congestive) and diastolic (congestive) heart failure: Secondary | ICD-10-CM | POA: Diagnosis not present

## 2014-12-16 DIAGNOSIS — N390 Urinary tract infection, site not specified: Secondary | ICD-10-CM | POA: Diagnosis not present

## 2014-12-16 DIAGNOSIS — E1165 Type 2 diabetes mellitus with hyperglycemia: Secondary | ICD-10-CM | POA: Diagnosis not present

## 2014-12-16 DIAGNOSIS — I5042 Chronic combined systolic (congestive) and diastolic (congestive) heart failure: Secondary | ICD-10-CM | POA: Diagnosis not present

## 2014-12-16 DIAGNOSIS — E46 Unspecified protein-calorie malnutrition: Secondary | ICD-10-CM | POA: Diagnosis not present

## 2014-12-16 DIAGNOSIS — N183 Chronic kidney disease, stage 3 (moderate): Secondary | ICD-10-CM | POA: Diagnosis not present

## 2014-12-16 DIAGNOSIS — E1122 Type 2 diabetes mellitus with diabetic chronic kidney disease: Secondary | ICD-10-CM | POA: Diagnosis not present

## 2014-12-17 DIAGNOSIS — E1165 Type 2 diabetes mellitus with hyperglycemia: Secondary | ICD-10-CM | POA: Diagnosis not present

## 2014-12-17 DIAGNOSIS — N183 Chronic kidney disease, stage 3 (moderate): Secondary | ICD-10-CM | POA: Diagnosis not present

## 2014-12-17 DIAGNOSIS — I5042 Chronic combined systolic (congestive) and diastolic (congestive) heart failure: Secondary | ICD-10-CM | POA: Diagnosis not present

## 2014-12-17 DIAGNOSIS — E1122 Type 2 diabetes mellitus with diabetic chronic kidney disease: Secondary | ICD-10-CM | POA: Diagnosis not present

## 2014-12-17 DIAGNOSIS — N390 Urinary tract infection, site not specified: Secondary | ICD-10-CM | POA: Diagnosis not present

## 2014-12-17 DIAGNOSIS — E46 Unspecified protein-calorie malnutrition: Secondary | ICD-10-CM | POA: Diagnosis not present

## 2014-12-22 DIAGNOSIS — N183 Chronic kidney disease, stage 3 (moderate): Secondary | ICD-10-CM | POA: Diagnosis not present

## 2014-12-22 DIAGNOSIS — E1122 Type 2 diabetes mellitus with diabetic chronic kidney disease: Secondary | ICD-10-CM | POA: Diagnosis not present

## 2014-12-22 DIAGNOSIS — E1165 Type 2 diabetes mellitus with hyperglycemia: Secondary | ICD-10-CM | POA: Diagnosis not present

## 2014-12-22 DIAGNOSIS — E46 Unspecified protein-calorie malnutrition: Secondary | ICD-10-CM | POA: Diagnosis not present

## 2014-12-22 DIAGNOSIS — I5042 Chronic combined systolic (congestive) and diastolic (congestive) heart failure: Secondary | ICD-10-CM | POA: Diagnosis not present

## 2014-12-22 DIAGNOSIS — N390 Urinary tract infection, site not specified: Secondary | ICD-10-CM | POA: Diagnosis not present

## 2014-12-24 DIAGNOSIS — E1165 Type 2 diabetes mellitus with hyperglycemia: Secondary | ICD-10-CM | POA: Diagnosis not present

## 2014-12-24 DIAGNOSIS — N390 Urinary tract infection, site not specified: Secondary | ICD-10-CM | POA: Diagnosis not present

## 2014-12-24 DIAGNOSIS — I5042 Chronic combined systolic (congestive) and diastolic (congestive) heart failure: Secondary | ICD-10-CM | POA: Diagnosis not present

## 2014-12-24 DIAGNOSIS — N183 Chronic kidney disease, stage 3 (moderate): Secondary | ICD-10-CM | POA: Diagnosis not present

## 2014-12-24 DIAGNOSIS — E1122 Type 2 diabetes mellitus with diabetic chronic kidney disease: Secondary | ICD-10-CM | POA: Diagnosis not present

## 2014-12-24 DIAGNOSIS — E46 Unspecified protein-calorie malnutrition: Secondary | ICD-10-CM | POA: Diagnosis not present

## 2014-12-31 ENCOUNTER — Other Ambulatory Visit: Payer: Self-pay | Admitting: Internal Medicine

## 2014-12-31 DIAGNOSIS — N390 Urinary tract infection, site not specified: Secondary | ICD-10-CM | POA: Diagnosis not present

## 2014-12-31 DIAGNOSIS — N183 Chronic kidney disease, stage 3 (moderate): Secondary | ICD-10-CM | POA: Diagnosis not present

## 2014-12-31 DIAGNOSIS — E46 Unspecified protein-calorie malnutrition: Secondary | ICD-10-CM | POA: Diagnosis not present

## 2014-12-31 DIAGNOSIS — E1165 Type 2 diabetes mellitus with hyperglycemia: Secondary | ICD-10-CM | POA: Diagnosis not present

## 2014-12-31 DIAGNOSIS — E1122 Type 2 diabetes mellitus with diabetic chronic kidney disease: Secondary | ICD-10-CM | POA: Diagnosis not present

## 2014-12-31 DIAGNOSIS — I5042 Chronic combined systolic (congestive) and diastolic (congestive) heart failure: Secondary | ICD-10-CM | POA: Diagnosis not present

## 2015-01-02 DIAGNOSIS — E1122 Type 2 diabetes mellitus with diabetic chronic kidney disease: Secondary | ICD-10-CM | POA: Diagnosis not present

## 2015-01-02 DIAGNOSIS — I5042 Chronic combined systolic (congestive) and diastolic (congestive) heart failure: Secondary | ICD-10-CM | POA: Diagnosis not present

## 2015-01-02 DIAGNOSIS — N390 Urinary tract infection, site not specified: Secondary | ICD-10-CM | POA: Diagnosis not present

## 2015-01-02 DIAGNOSIS — E46 Unspecified protein-calorie malnutrition: Secondary | ICD-10-CM | POA: Diagnosis not present

## 2015-01-02 DIAGNOSIS — N183 Chronic kidney disease, stage 3 (moderate): Secondary | ICD-10-CM | POA: Diagnosis not present

## 2015-01-02 DIAGNOSIS — E1165 Type 2 diabetes mellitus with hyperglycemia: Secondary | ICD-10-CM | POA: Diagnosis not present

## 2015-01-05 ENCOUNTER — Ambulatory Visit (INDEPENDENT_AMBULATORY_CARE_PROVIDER_SITE_OTHER): Payer: Medicare Other | Admitting: Internal Medicine

## 2015-01-05 ENCOUNTER — Encounter: Payer: Self-pay | Admitting: Internal Medicine

## 2015-01-05 VITALS — BP 116/80 | HR 86 | Ht 68.0 in | Wt 223.8 lb

## 2015-01-05 DIAGNOSIS — I2699 Other pulmonary embolism without acute cor pulmonale: Secondary | ICD-10-CM | POA: Diagnosis not present

## 2015-01-05 NOTE — Progress Notes (Signed)
Subjective:    Patient ID: Melissa Shannon, female    DOB: Jun 26, 1928,    MRN: [id number]    Brief patient profile:  25  yowf never smoker s/p admit  Admit date: 06/11/2014 Discharge date: 06/19/2014   Discharge Diagnoses:   Acute diastolic CHF (congestive heart failure)  Pulmonary HTN, probably PE.   Hypertension  Diabetes mellitus without complication  Hyperlipemia  Congestive heart failure  Malnutrition of moderate degree  Right heart failure due to pulmonary hypertension  Pulmonary hypertension  Multiple pulmonary nodules  Acute respiratory failure with hypoxia  Congestive heart disease    06/30/2014 transition of care/ post hosp  F/u ov/Melissa Shannon re:  Chief Complaint  Patient presents with  . Pulmonary Consult    Referred by Dr. Loralie Champagne for eval of pulmonary nodule. Pt c/o DOE with walking approx 25 ft.   see my inpt note from 06/14/14 with new onset sob/desat assoc with RV strain and intermediate V/Q c/w PE ? Chronicity > she is  Improving back to baseline doe on walker for djd / knees slow her down more than breathing rec F/u CT chest in 3 m > no change c/w mucus plugs    09/22/2014 f/u ov/Melissa Shannon re: f/u abn ct chest /presumed PE with PH s/p 3 m anticoagulation  Chief Complaint  Patient presents with  . Follow-up    Pt states her breathing is doing well overall. No new co's today.    pt not enthusiastic for more studies/no cough or limiting sob rec Please remember to go to the  x-ray department downstairs for your tests - we will call you with the results when they are available.       01/05/2015 f/u ov/Melissa Shannon re: s/p pe/ echo has normalized since last ov   Chief Complaint  Patient presents with  . Follow-up    Pt states that her breathing is doing well. No co's today.      Not limited by breathing from desired activities  = walking anywhere she wants with rolling walker  No obvious day to day or daytime variabilty or assoc   cp or chest  tightness, subjective wheeze overt sinus or hb symptoms. No unusual exp hx or h/o childhood pna/ asthma or knowledge of premature birth.  Sleeping ok without nocturnal  or early am exacerbation  of respiratory  c/o's or need for noct saba. Also denies any obvious fluctuation of symptoms with weather or environmental changes or other aggravating or alleviating factors except as outlined above   Current Medications, Allergies, Complete Past Medical History, Past Surgical History, Family History, and Social History were reviewed in [employer] record.  ROS  The following are not active complaints unless bolded sore throat, dysphagia, dental problems, itching, sneezing,  nasal congestion or excess/ purulent secretions, ear ache,   fever, chills, sweats, unintended wt loss, pleuritic or exertional cp, hemoptysis,  orthopnea pnd or leg swelling, presyncope, palpitations, heartburn, abdominal pain, anorexia, nausea, vomiting, diarrhea  or change in bowel or urinary habits, change in stools or urine, dysuria,hematuria,  rash, arthralgias, visual complaints, headache, numbness weakness or ataxia or problems with walking or coordination,  change in mood/affect or memory.                      Objective:   Physical Exam  Pleasant  amb wf nad walking with rolling walker   09/22/2014          219  >  01/05/2015     224  Wt Readings from Last 3 Encounters:  06/30/14 220 lb (99.791 kg)  06/23/14 217 lb (98.431 kg)  06/19/14 219 lb 3.2 oz (99.428 kg)    Vital signs reviewed  HEENT: nl dentition, turbinates, and orophanx. Nl external ear canals without cough reflex   NECK :  without JVD/Nodes/TM/ nl carotid upstrokes bilaterally   LUNGS: no acc muscle use, clear to A and P bilaterally without cough on insp or exp maneuvers   CV:  RRR  no s3 or murmur or increase in P2, no pitting  edema detected (see MS)  ABD:  soft and nontender with nl excursion in the supine position. No  bruits or organomegaly, bowel sounds nl  MS:  warm without deformities, calf tenderness, cyanosis or clubbing - severe chronic appearing swelling both lower ext despite elastic hose  SKIN: warm and dry without lesions            CXR PA and Lateral:   09/22/2014 :     I personally reviewed images and agree with radiology impression as follows:    Mediastinum and hilar structures are normal. Lungs are clear. No focal pulmonary infiltrate or focal pulmonary lesion identified. No pleural effusion or pneumothorax. Reference made to prior chest CT of 09/11/2014. Stable sclerotic densities left proximal humerus, no change.      Assessment & Plan:

## 2015-01-05 NOTE — Patient Instructions (Addendum)
Please see patient coordinator before you leave today  to schedule VQ scan and then I will call with recommendatoins on your Eliquis but for now would continue to use the full dose unless there is a new problem (like bleeding or increase fall risk) to push the risk over the benefit ratio in favor of a lower or zero dose

## 2015-01-06 ENCOUNTER — Encounter: Payer: Self-pay | Admitting: Internal Medicine

## 2015-01-06 NOTE — Assessment & Plan Note (Signed)
-   See V/Q 06/13/14  - echo 06/13/14 c/w RV strain, ? Acute on chronic  - Venous dopplers 06/14/14 neg both lower ext but tds  - echo 08/25/14 repeat Echo >  PAS down to 32 - Echo 3/316 repeat Echo > wnl  - baseline V/Q repeat requested   I had an extended final summary discussion with the patient reviewing all relevant studies completed to date and  lasting 15 to 20 minutes of a 25 minute visit on the following issues:    1) as expected, here PAS by echo has normalized p 6 m on elaquis with main risk of recurrent PE = bilateral chronic severe lower ext swelling - venous dopplers are technically inadequate in this setting and it would be very easy to miss a recurrent PE so I would rec she continue to take elaquis but could switch to the lower dose at this point   2) if starts to have more difficulty with falling then the risk / benefit balance would favor stopping all rx except for maybe asa 325 mg daily   3) v/q needs to be done for baseline / comparison for future reference > ordered  4) pulmonary f/u is prn

## 2015-01-07 ENCOUNTER — Ambulatory Visit (HOSPITAL_COMMUNITY)
Admission: RE | Admit: 2015-01-07 | Discharge: 2015-01-07 | Disposition: A | Discharge status: Home / Self Care | Payer: Medicare Other | Source: Ambulatory Visit | Attending: Internal Medicine | Admitting: Internal Medicine

## 2015-01-07 ENCOUNTER — Ambulatory Visit (HOSPITAL_COMMUNITY): Payer: Medicare Other

## 2015-01-07 DIAGNOSIS — J449 Chronic obstructive pulmonary disease, unspecified: Secondary | ICD-10-CM | POA: Diagnosis not present

## 2015-01-07 DIAGNOSIS — Z86711 Personal history of pulmonary embolism: Secondary | ICD-10-CM | POA: Diagnosis present

## 2015-01-07 DIAGNOSIS — I2699 Other pulmonary embolism without acute cor pulmonale: Secondary | ICD-10-CM

## 2015-01-07 DIAGNOSIS — R05 Cough: Secondary | ICD-10-CM | POA: Diagnosis not present

## 2015-01-07 MED ORDER — TECHNETIUM TC 99M DIETHYLENETRIAME-PENTAACETIC ACID: 43.4000 | Freq: Once | INTRAVENOUS | Status: AC | PRN

## 2015-01-07 MED ORDER — TECHNETIUM TO 99M ALBUMIN AGGREGATED
6.3000 | Freq: Once | INTRAVENOUS | Status: AC | PRN
  Administered 2015-01-07: 6 via INTRAVENOUS

## 2015-01-07 NOTE — Progress Notes (Signed)
Quick Note:  Spoke with pt and notified of results per Dr. Wert. Pt verbalized understanding and denied any questions.  ______ 

## 2015-01-07 NOTE — Progress Notes (Signed)
Quick Note:  LMTCB ______ 

## 2015-01-12 ENCOUNTER — Other Ambulatory Visit: Payer: Self-pay | Admitting: Nurse Practitioner

## 2015-01-26 ENCOUNTER — Other Ambulatory Visit: Payer: Self-pay | Admitting: Internal Medicine

## 2015-02-09 ENCOUNTER — Other Ambulatory Visit: Payer: Self-pay | Admitting: Nurse Practitioner

## 2015-02-20 ENCOUNTER — Other Ambulatory Visit: Payer: Self-pay | Admitting: Nurse Practitioner

## 2015-02-20 ENCOUNTER — Other Ambulatory Visit: Payer: Self-pay | Admitting: Internal Medicine

## 2015-03-05 ENCOUNTER — Other Ambulatory Visit: Payer: Medicare Other

## 2015-03-05 DIAGNOSIS — E1122 Type 2 diabetes mellitus with diabetic chronic kidney disease: Secondary | ICD-10-CM

## 2015-03-05 DIAGNOSIS — N189 Chronic kidney disease, unspecified: Secondary | ICD-10-CM | POA: Diagnosis not present

## 2015-03-06 LAB — COMPREHENSIVE METABOLIC PANEL
ALT: 19 IU/L (ref 0–32)
AST: 17 IU/L (ref 0–40)
Albumin/Globulin Ratio: 1.9 (ref 1.1–2.5)
Albumin: 4.2 g/dL (ref 3.5–4.7)
Alkaline Phosphatase: 79 IU/L (ref 39–117)
BUN/Creatinine Ratio: 27 — ABNORMAL HIGH (ref 11–26)
BUN: 44 mg/dL — ABNORMAL HIGH (ref 8–27)
Bilirubin Total: 0.5 mg/dL (ref 0.0–1.2)
CO2: 25 mmol/L (ref 18–29)
Calcium: 10.4 mg/dL — ABNORMAL HIGH (ref 8.7–10.3)
Chloride: 100 mmol/L (ref 97–108)
Creatinine, Ser: 1.66 mg/dL — ABNORMAL HIGH (ref 0.57–1.00)
GFR calc Af Amer: 32 mL/min/{1.73_m2} — ABNORMAL LOW (ref >59)
GFR calc non Af Amer: 28 mL/min/{1.73_m2} — ABNORMAL LOW (ref >59)
Globulin, Total: 2.2 g/dL (ref 1.5–4.5)
Glucose: 137 mg/dL — ABNORMAL HIGH (ref 65–99)
Potassium: 5.2 mmol/L (ref 3.5–5.2)
Sodium: 142 mmol/L (ref 134–144)
Total Protein: 6.4 g/dL (ref 6.0–8.5)

## 2015-03-06 LAB — HEMOGLOBIN A1C
Est. average glucose Bld gHb Est-mCnc: 160 mg/dL
Hgb A1c MFr Bld: 7.2 % — ABNORMAL HIGH (ref 4.8–5.6)

## 2015-03-09 ENCOUNTER — Ambulatory Visit (INDEPENDENT_AMBULATORY_CARE_PROVIDER_SITE_OTHER): Payer: Medicare Other | Admitting: Pharmacotherapy

## 2015-03-09 ENCOUNTER — Encounter: Payer: Self-pay | Admitting: Pharmacotherapy

## 2015-03-09 ENCOUNTER — Other Ambulatory Visit: Payer: Self-pay | Admitting: *Deleted

## 2015-03-09 VITALS — BP 120/72 | HR 94 | Temp 98.2°F | Resp 20 | Ht 68.0 in | Wt 226.0 lb

## 2015-03-09 DIAGNOSIS — E1122 Type 2 diabetes mellitus with diabetic chronic kidney disease: Secondary | ICD-10-CM | POA: Diagnosis not present

## 2015-03-09 DIAGNOSIS — E785 Hyperlipidemia, unspecified: Secondary | ICD-10-CM

## 2015-03-09 DIAGNOSIS — I10 Essential (primary) hypertension: Secondary | ICD-10-CM | POA: Diagnosis not present

## 2015-03-09 DIAGNOSIS — E119 Type 2 diabetes mellitus without complications: Secondary | ICD-10-CM | POA: Diagnosis not present

## 2015-03-09 DIAGNOSIS — N189 Chronic kidney disease, unspecified: Secondary | ICD-10-CM

## 2015-03-09 MED ORDER — INSULIN GLARGINE 100 UNIT/ML SOLOSTAR PEN: 14.0000 [IU] | PEN_INJECTOR | Freq: Every day | SUBCUTANEOUS | Status: DC

## 2015-03-09 MED ORDER — ROSUVASTATIN CALCIUM 20 MG PO TABS: ORAL_TABLET | ORAL | Status: DC

## 2015-03-09 NOTE — Patient Instructions (Signed)
Increase Lantus 14 units daily

## 2015-03-09 NOTE — Telephone Encounter (Signed)
[facility] 

## 2015-03-09 NOTE — Progress Notes (Signed)
  Subjective:    Melissa Shannon is a 81 y.o.white female who presents for follow-up of Type 2 diabetes mellitus.  A1C 7.2% (was 7.3%)  She has significant peripheral edema. Average BG:  120-188m/dl No hypoglycemia.  She reports she is making healthy food choices. No routine exercise.  Her mobility is limited.  She requires a walker for assistance. Nocturia every night. Some urinary incontinence. Denies problems with feet. Wears corrective lenses. Some blurry vision.  Difficulty reading at times.  Review of Systems A comprehensive review of systems was negative except for: Eyes: positive for contacts/glasses Cardiovascular: positive for lower extremity edema Genitourinary: positive for nocturia and urinary incontinence Endocrine: positive for diabetic symptoms including blurry vision    Objective:    BP 120/72 mmHg  Pulse 94  Temp(Src) 98.2 F (36.8 C) (Oral)  Resp 20  Ht 5' 8"$  (1.727 m)  Wt 226 lb (102.513 kg)  BMI 34.37 kg/m2  SpO2 96%  General:  alert, cooperative and no distress  Oropharynx: normal findings: lips normal without lesions and gums healthy   Eyes:  negative findings: lids and lashes normal and conjunctivae and sclerae normal   Ears:  external ears normal        Lung: clear to auscultation bilaterally  Heart:  regular rate and rhythm     Extremities: edema significant edema bilateral lower extremities  Skin: dry     Neuro: mental status, speech normal, alert and oriented x3 and ambulates with walker   Lab Review GLUCOSE (mg/dL)  Date Value  03/05/2015 137*  12/01/2014 155*  10/10/2014 201*   GLUCOSE, BLD (mg/dL)  Date Value  11/29/2014 129*  11/28/2014 119*  07/31/2014 193*   CO2 (mmol/L)  Date Value  03/05/2015 25  12/01/2014 22  11/29/2014 28   BUN (mg/dL)  Date Value  03/05/2015 44*  12/01/2014 37*  11/29/2014 27*  11/28/2014 28*  10/10/2014 37*  07/31/2014 32*   CREATININE, SER (mg/dL)  Date Value  03/05/2015 1.66*   12/01/2014 1.69*  11/29/2014 1.49*       Assessment:    Diabetes Mellitus type II, under good control.   BP at goal <140/90   Plan:    1.  Rx changes: Increase Lantus to 14 units daily 2.  Continue Tradjenta 57mdaily. 3.  Counseled on low sodium diet. 4.  Counseled on need for routine exercise.  Goal is 30-45 minutes 5 x week. 5.  BP at goal <140/90

## 2015-03-19 ENCOUNTER — Ambulatory Visit (INDEPENDENT_AMBULATORY_CARE_PROVIDER_SITE_OTHER): Payer: Medicare Other | Admitting: Nurse Practitioner

## 2015-03-19 ENCOUNTER — Encounter: Payer: Self-pay | Admitting: Nurse Practitioner

## 2015-03-19 VITALS — BP 116/78 | HR 96 | Temp 98.2°F | Resp 20 | Ht 68.0 in | Wt 224.8 lb

## 2015-03-19 DIAGNOSIS — I2699 Other pulmonary embolism without acute cor pulmonale: Secondary | ICD-10-CM | POA: Diagnosis not present

## 2015-03-19 DIAGNOSIS — N184 Chronic kidney disease, stage 4 (severe): Secondary | ICD-10-CM

## 2015-03-19 DIAGNOSIS — K5900 Constipation, unspecified: Secondary | ICD-10-CM | POA: Diagnosis not present

## 2015-03-19 DIAGNOSIS — E785 Hyperlipidemia, unspecified: Secondary | ICD-10-CM

## 2015-03-19 DIAGNOSIS — I5032 Chronic diastolic (congestive) heart failure: Secondary | ICD-10-CM

## 2015-03-19 DIAGNOSIS — I10 Essential (primary) hypertension: Secondary | ICD-10-CM

## 2015-03-19 DIAGNOSIS — N189 Chronic kidney disease, unspecified: Secondary | ICD-10-CM | POA: Diagnosis not present

## 2015-03-19 DIAGNOSIS — E1122 Type 2 diabetes mellitus with diabetic chronic kidney disease: Secondary | ICD-10-CM | POA: Diagnosis not present

## 2015-03-19 NOTE — Patient Instructions (Signed)
Follow up in 4 months for routine follow up, keep legs elevated above level of heart as toelrated, decreased sodium intake

## 2015-03-19 NOTE — Progress Notes (Signed)
Patient ID: Melissa Shannon, female   DOB: 1928/01/16, 81 y.o.   MRN: [id number]    PCP: Lauree Chandler, NP  Allergies  Allergen Reactions  . Penicillins Other (See Comments)    Throat felt tight    Chief Complaint  Patient presents with  . Medical Management of Chronic Issues    3 month follow-up     HPI: Patient is a 81 y.o. female seen in the office today for routine follow up; pt with medical hx of DM, HTN, PE, CHF. improved Has been following with Sherlyn Lees due to diabetes, improved glycemic control Followed with Dr Melvyn Novas in May due to PE- reduced eliquis to 2.5 mg twice a daily  Pt notes occasional weight gain and will increase lasix per order.  Reports home weights are 212-214, this morning she was 214.  Has not followed with Cardiology recently- saw in March due to follow up in September.  No shortness of breath or chest pains, edema has  Attempting to follow low sodium diet.  Stools are hard- started colace which improved stools.   Advanced Directive information Does patient have an advance directive?: Yes Review of Systems:  Review of Systems  Constitutional: Negative for activity change, appetite change, fatigue and unexpected weight change.  HENT: Negative for congestion and hearing loss.   Eyes: Negative.   Respiratory: Negative for cough and shortness of breath.   Cardiovascular: Positive for leg swelling (improved). Negative for chest pain and palpitations.  Gastrointestinal: Negative for abdominal pain, diarrhea and constipation.  Genitourinary: Negative for dysuria and difficulty urinating.       Incontinence of bladder  Musculoskeletal: Positive for gait problem. Negative for myalgias and arthralgias.       Joint stiffness  Skin: Negative for color change and wound.  Neurological: Positive for weakness (generalized). Negative for dizziness.  Psychiatric/Behavioral: Positive for sleep disturbance. Negative for behavioral problems, confusion and  agitation.    Past Medical History  Diagnosis Date  . Hypertension   . Diabetes mellitus without complication   . Hyperlipidemia   . Urinary incontinence   . Hypothyroidism   . Arthritis     bilateral knees  . Pulmonary embolism   . Edema   . CHF (congestive heart failure)    Past Surgical History  Procedure Laterality Date  . Cholecystectomy    . Tonsillectomy and adenoidectomy  1937    Dr.Brewer  . Pilonidal cyst excision  [place] [facility]      bilateral cataracts  . Right elbow  1999    x 2, still has one piece of metal in it  . Bladder surgery  2000    Dr.Tannerbaum (Bladder Tact)  . Cataract extraction  03/2013    Dr.Shapiro   Social History:   reports that she has never smoked. She has never used smokeless tobacco. She reports that she does not drink alcohol or use illicit drugs.  Family History  Problem Relation Age of Onset  . Suicidality Father   . Cancer Brother   . Cancer Maternal Grandfather   . Diabetes Maternal Grandmother   . Melanoma Brother     Medications: Patient's Medications  New Prescriptions   No medications on file  Previous Medications   APIXABAN (ELIQUIS) 5 MG TABS TABLET    Take 0.5 tablets (2.5 mg total) by mouth 2 (two) times daily.   BD PEN NEEDLE NANO U/F 32G X 4 MM MISC  daily. use as directed   CHOLECALCIFEROL (VITAMIN D3) 5000 UNITS CHEW    Chew 1 tablet by mouth daily.   FEEDING SUPPLEMENT, ENSURE COMPLETE, (ENSURE COMPLETE) LIQD    Take 237 mLs by mouth 2 (two) times daily between meals as needed (As requested by patient).   FUROSEMIDE (LASIX) 20 MG TABLET    Take 1 tablet (20 mg total) by mouth daily. May take addiitonal 40 mg in the PM for weight 214 lbs. Or greater   HYDRALAZINE (APRESOLINE) 10 MG TABLET    TAKE 1 TABLET BY MOUTH 3 TIMES DAILY   INSULIN GLARGINE (LANTUS SOLOSTAR) 100 UNIT/ML SOLOSTAR PEN    Inject 14 Units into the skin daily.   LEVOTHYROXINE (SYNTHROID, LEVOTHROID) 100 MCG TABLET     TAKE 1 TABLET (100 MCG TOTAL) BY MOUTH DAILY BEFORE BREAKFAST   LISINOPRIL (PRINIVIL,ZESTRIL) 10 MG TABLET    TAKE 1 TABLET (10 MG TOTAL) BY MOUTH DAILY.   MULTIPLE VITAMIN (MULTIVITAMIN WITH MINERALS) TABS TABLET    Take 1 tablet by mouth daily.   ONE TOUCH ULTRA TEST TEST STRIP    USE EVERY OTHER DAY TO TEST BLOOD SUGAR   ONETOUCH DELICA LANCETS 99991111 MISC    USE EVERY OTHER DAY TO TEST BLOOD SUGAR   POLYETHYLENE GLYCOL (MIRALAX / GLYCOLAX) PACKET    Take 17 g by mouth as needed for mild constipation.    ROSUVASTATIN (CRESTOR) 20 MG TABLET    Take one tablet by mouth once daily for cholesterol   TRADJENTA 5 MG TABS TABLET    TAKE 1 TABLET BY MOUTH EVERY DAY  Modified Medications   No medications on file  Discontinued Medications   ELIQUIS 5 MG TABS TABLET    TAKE 1 TABLET TWICE A DAY     Physical Exam:  Filed Vitals:   03/19/15 0942  BP: 116/78  Pulse: 96  Temp: 98.2 F (36.8 C)  TempSrc: Oral  Resp: 20  Height: 5\' 8"  (1.727 m)  Weight: 224 lb 12.8 oz (101.969 kg)  SpO2: 94%    Physical Exam  Constitutional: She is oriented to person, place, and time. She appears well-developed and well-nourished.  HENT:  Mouth/Throat: Oropharynx is clear and moist. No oropharyngeal exudate.  Eyes: Conjunctivae and EOM are normal. Pupils are equal, round, and reactive to light. No scleral icterus.  Neck: Neck supple.  Cardiovascular: Normal rate, regular rhythm and intact distal pulses.  Exam reveals no gallop and no friction rub.   Murmur heard. Pulmonary/Chest: Effort normal and breath sounds normal.  Abdominal: Soft. Bowel sounds are normal. There is no tenderness.  Musculoskeletal: She exhibits edema (trace bilaterally).  Walks with walker   Neurological: She is alert and oriented to person, place, and time.  Skin: Skin is warm and dry. No rash noted.  Psychiatric: She has a normal mood and affect. Her behavior is normal.    Labs reviewed: Basic Metabolic Panel:  Recent Labs   06/11/14 2214  11/29/14 0650 12/01/14 1000 03/05/15 0855  NA  --   < > 138 142 142  K  --   < > 3.8 4.2 5.2  CL  --   < > 102 101 100  CO2  --   < > 28 22 25   GLUCOSE  --   < > 129* 155* 137*  BUN  --   < > 27* 37* 44*  CREATININE 1.67*  < > 1.49* 1.69* 1.66*  CALCIUM  --   < > 10.1  10.3 10.4*  TSH 0.681  --  0.737  --   --   < > = values in this interval not displayed. Liver Function Tests:  Recent Labs  06/12/14 0421  11/28/14 1952 11/29/14 0650 03/05/15 0855  AST 10  < > 16 18 17   ALT 6  < > 14 12 19   ALKPHOS 61  < > 76 75 79  BILITOT 0.4  < > 0.7 0.9 0.5  PROT 5.9*  < > 7.0 7.0 6.4  ALBUMIN 3.1*  --  4.1 3.9  --   < > = values in this interval not displayed. No results for input(s): LIPASE, AMYLASE in the last 8760 hours. No results for input(s): AMMONIA in the last 8760 hours. CBC:  Recent Labs  09/01/14 0855 11/28/14 1952 11/29/14 0650  WBC 8.9 9.3 9.3  NEUTROABS 5.0 6.6 5.8  HGB 12.7 12.7 13.1  HCT 40.7 39.4 40.1  MCV 91 89.3 89.3  PLT 248 200 200   Lipid Panel:  Recent Labs  09/01/14 0855 10/10/14 0929  CHOL 173 178  HDL 61 66  LDLCALC 66 72  TRIG 232* 198*  CHOLHDL 2.8 2.7   TSH:  Recent Labs  06/11/14 2214 11/29/14 0650  TSH 0.681 0.737   A1C: Lab Results  Component Value Date   HGBA1C 7.2* 03/05/2015     Assessment/Plan 1. Type 2 diabetes mellitus with diabetic chronic kidney disease Following with [facility] for blood sugars -ranging from 120-130 fasting, lantus was recently increased to 14 units, to cont this -cont tradjenta and lantus as prescribed -a1c in 3 months to follow up    2. Essential hypertension -blood pressure controlled at this time -conts on hydralazine 10 mg TID, lisinopril, and lasix  3. Constipation, unspecified constipation type -improved with colace, not taking routinely -to take colace 100 mg daily to help soften stoll  4. Hyperlipidemia -LDL at goal in february with elevated triglycerides, follow  up lipids scheduled in 3 months .  To cont crestor  5. CKD (chronic kidney disease) stage 4, GFR 15-29 ml/min BUN/CR stable, eliquis reduced to 2.5 mg twice daily. -to stay hydrated, no use of NSAIDs  6. Chronic diastolic congestive heart failure Euvolemic, conts to weigh daily and weights have been stable.  conts on lasix 20 mg daily with additional 40 if needed for increase in weight -plans to follow up with cardiology in September  7. Pulmonary embolus -no signs of recurrence. Followed with Dr Melvyn Novas in may -cont on eliquis 2.5 mg twice daily  - CBC with Differential; Future  Follow up in 4 months   Jessica K. Harle Battiest  [facility] [phone] 8 am - 5 pm) [phone] (after hours)

## 2015-03-28 ENCOUNTER — Other Ambulatory Visit: Payer: Self-pay | Admitting: Internal Medicine

## 2015-04-12 ENCOUNTER — Other Ambulatory Visit: Payer: Self-pay | Admitting: Nurse Practitioner

## 2015-04-20 ENCOUNTER — Other Ambulatory Visit: Payer: Self-pay | Admitting: Nurse Practitioner

## 2015-05-10 ENCOUNTER — Other Ambulatory Visit: Payer: Self-pay | Admitting: Internal Medicine

## 2015-05-18 ENCOUNTER — Encounter (HOSPITAL_COMMUNITY): Payer: Self-pay

## 2015-05-18 ENCOUNTER — Ambulatory Visit (HOSPITAL_COMMUNITY)
Admission: RE | Admit: 2015-05-18 | Discharge: 2015-05-18 | Disposition: A | Discharge status: Home / Self Care | Payer: Medicare Other | Source: Ambulatory Visit | Attending: Cardiology | Admitting: Cardiology

## 2015-05-18 VITALS — BP 118/66 | HR 90 | Wt 231.2 lb

## 2015-05-18 DIAGNOSIS — Z79899 Other long term (current) drug therapy: Secondary | ICD-10-CM | POA: Insufficient documentation

## 2015-05-18 DIAGNOSIS — I2609 Other pulmonary embolism with acute cor pulmonale: Secondary | ICD-10-CM | POA: Diagnosis not present

## 2015-05-18 DIAGNOSIS — E785 Hyperlipidemia, unspecified: Secondary | ICD-10-CM | POA: Diagnosis not present

## 2015-05-18 DIAGNOSIS — N183 Chronic kidney disease, stage 3 (moderate): Secondary | ICD-10-CM | POA: Insufficient documentation

## 2015-05-18 DIAGNOSIS — I5032 Chronic diastolic (congestive) heart failure: Secondary | ICD-10-CM | POA: Insufficient documentation

## 2015-05-18 DIAGNOSIS — Z794 Long term (current) use of insulin: Secondary | ICD-10-CM | POA: Insufficient documentation

## 2015-05-18 DIAGNOSIS — E1122 Type 2 diabetes mellitus with diabetic chronic kidney disease: Secondary | ICD-10-CM | POA: Diagnosis not present

## 2015-05-18 DIAGNOSIS — N184 Chronic kidney disease, stage 4 (severe): Secondary | ICD-10-CM

## 2015-05-18 DIAGNOSIS — Z7902 Long term (current) use of antithrombotics/antiplatelets: Secondary | ICD-10-CM | POA: Diagnosis not present

## 2015-05-18 DIAGNOSIS — I129 Hypertensive chronic kidney disease with stage 1 through stage 4 chronic kidney disease, or unspecified chronic kidney disease: Secondary | ICD-10-CM | POA: Diagnosis not present

## 2015-05-18 DIAGNOSIS — E039 Hypothyroidism, unspecified: Secondary | ICD-10-CM | POA: Diagnosis not present

## 2015-05-18 DIAGNOSIS — M179 Osteoarthritis of knee, unspecified: Secondary | ICD-10-CM | POA: Diagnosis not present

## 2015-05-18 LAB — BASIC METABOLIC PANEL
Anion gap: 9 (ref 5–15)
BUN: 42 mg/dL — AB (ref 6–20)
CO2: 28 mmol/L (ref 22–32)
CREATININE: 1.56 mg/dL — AB (ref 0.44–1.00)
Calcium: 10 mg/dL (ref 8.9–10.3)
Chloride: 102 mmol/L (ref 101–111)
GFR calc non Af Amer: 29 mL/min — ABNORMAL LOW (ref >60)
GFR, EST AFRICAN AMERICAN: 34 mL/min — AB (ref >60)
Glucose, Bld: 203 mg/dL — ABNORMAL HIGH (ref 65–99)
Potassium: 4.3 mmol/L (ref 3.5–5.1)
SODIUM: 139 mmol/L (ref 135–145)

## 2015-05-18 LAB — CBC
HCT: 41.3 % (ref 36.0–46.0)
Hemoglobin: 13 g/dL (ref 12.0–15.0)
MCH: 28.8 pg (ref 26.0–34.0)
MCHC: 31.5 g/dL (ref 30.0–36.0)
MCV: 91.6 fL (ref 78.0–100.0)
PLATELETS: 216 10*3/uL (ref 150–400)
RBC: 4.51 MIL/uL (ref 3.87–5.11)
RDW: 13.4 % (ref 11.5–15.5)
WBC: 9 10*3/uL (ref 4.0–10.5)

## 2015-05-18 MED ORDER — LISINOPRIL 10 MG PO TABS: 5.0000 mg | ORAL_TABLET | Freq: Every day | ORAL | Status: DC

## 2015-05-18 NOTE — Progress Notes (Signed)
REDS VEST score:28 Chest ruler size:14

## 2015-05-18 NOTE — Patient Instructions (Signed)
DECREASE Lisinopril to 5mg  (1/2 tablet) daily.  Routine lab work today. (bmet cbc) Will notify you of abnormal results  FOLLOW UP:6 months

## 2015-05-18 NOTE — Progress Notes (Signed)
Patient ID: Melissa Shannon, female   DOB: November 30, 1927, 81 y.o.   MRN: [id number] PCP: Dr. Bubba Camp  80 yo with history of HTN, DM, and immobility due to osteoarthritis presents for followup of right heart failure and PE.  At baseline, patient is not very active.  She has knee arthritis that is very limiting.  She does not do much walking and uses a walker to get around.  She has been very limited for a few years now.  She was admitted in 10/15 with dyspnea.  The dyspnea began about 2-3 days prior to admission.  She would get short of breath after walking about 20 feet with her walker.  She was noted to be in CHF at admission with troponin elevated to 0.6.  Echo showed normal LV size and systolic function but severely dilated/dysfunctional RV with pulmonary hypertension.  V/Q scan was done and showed intermediate probability for PE.  She was seen by pulmonary and thought to have a PE.  Apixaban was started.  She was diuresed with IV Lasix.  Repeat echo was done 3/16.  This showed EF 55-60%, normal RV size and systolic function, and PA systolic pressure 28 mmHg.  V/Q scan in 5/16 was normal.   She was admitted in 4/16 with altered mental status, likely due to E coli urosepsis.   She returns for followup. Breathing is ok though she is not very active . No dyspnea walking on flat ground for short distances. Denies PND/Orthopnea. Ongoing knee pain walks with walking. No BRBPR or melena on apixaban.  She is taking Lasix 40 mg daily.  She uses a cane or walker.  No falls.   ReDs vest measurement 28.   Labs (06/18/14): K 4.4, creatinine 1.44, HCT 35.9, BNP 10167 => 1415 Labs (09/01/2014): K 4.2 Creatinine 1.23  Labs (2/16): K 4.3, creatinine 1.62, HCT 40.7 Labs (7/16): K 5.2, creatinine 1.66  PMH: 1. HTN 2. Hyperlipidemia 3. Osteoarthritis: Involving knees, uses walker.  4. Type II diabetes 5. H/o CCY 6. CKD stage III 7. Hypothyroidism 8. PE: Admitted 10/15 with dyspnea/CHF.  Echo (10/15) with EF 60-65%,  D-shaped interventricular septum, severely dilated RV with moderately decreased RV systolic function, PA systolic pressure 47 mmHg.  V/Q scan (10/15) was intermediate probability for PE. Patient was started on apixaban.  Echo (3/16) with EF 55-60%, basal inferior hypokinesis, normal RV size and systolic function, PA systolic pressure 28 mmHg. V/Q scan 5/16 was normal.  9. Nodules on CT chest 10/15: Per Dr Melvyn Novas, likely benign findings.   FH: No h/o venous thromboembolism, no heart problems that she knows of.   SH: Widow, lives alone, nonsmoker, 3 kids.   ROS: All systems reviewed and negative except as per HPI.   Current Outpatient Prescriptions  Medication Sig Dispense Refill  . apixaban (ELIQUIS) 5 MG TABS tablet Take 0.5 tablets (2.5 mg total) by mouth 2 (two) times daily. 60 tablet 3  . BD PEN NEEDLE NANO U/F 32G X 4 MM MISC daily. use as directed  6  . Cholecalciferol (VITAMIN D3) 5000 UNITS CHEW Chew 1 tablet by mouth daily.    . feeding supplement, ENSURE COMPLETE, (ENSURE COMPLETE) LIQD Take 237 mLs by mouth 2 (two) times daily between meals as needed (As requested by patient). (Patient taking differently: Take 237 mLs by mouth 2 (two) times daily between meals. ) 30 Bottle 0  . furosemide (LASIX) 20 MG tablet Take 1 tablet (20 mg total) by mouth daily. May take addiitonal 40  mg in the PM for weight 214 lbs. Or greater 30 tablet 0  . hydrALAZINE (APRESOLINE) 10 MG tablet TAKE 1 TABLET BY MOUTH 3 TIMES DAILY 90 tablet 0  . Insulin Glargine (LANTUS SOLOSTAR) 100 UNIT/ML Solostar Pen Inject 14 Units into the skin daily. 15 mL 6  . levothyroxine (SYNTHROID, LEVOTHROID) 100 MCG tablet TAKE 1 TABLET (100 MCG TOTAL) BY MOUTH DAILY BEFORE BREAKFAST 90 tablet 1  . lisinopril (PRINIVIL,ZESTRIL) 10 MG tablet Take 0.5 tablets (5 mg total) by mouth daily. 30 tablet 3  . Multiple Vitamin (MULTIVITAMIN WITH MINERALS) TABS tablet Take 1 tablet by mouth daily. 30 tablet 0  . ONE TOUCH ULTRA TEST test  strip USE EVERY OTHER DAY TO TEST BLOOD SUGAR  3  . ONETOUCH DELICA LANCETS 99991111 MISC USE EVERY OTHER DAY TO TEST BLOOD SUGAR  3  . polyethylene glycol (MIRALAX / GLYCOLAX) packet Take 17 g by mouth as needed for mild constipation.     . rosuvastatin (CRESTOR) 20 MG tablet Take one tablet by mouth once daily for cholesterol 30 tablet 3  . TRADJENTA 5 MG TABS tablet TAKE 1 TABLET BY MOUTH EVERY DAY 30 tablet 3   No current facility-administered medications for this encounter.   BP 118/66 mmHg  Pulse 90  Wt 231 lb 4 oz (104.894 kg)  SpO2 97% General: NAD, obese Neck: No JVD, no thyromegaly or thyroid nodule.  Lungs: Clear to auscultation bilaterally with normal respiratory effort. CV: Nondisplaced PMI.  Heart mildly tachy, regular S1/S2, no S3/S4, no murmur.  Thick lower legs but no pitting edema.  No carotid bruit.  Normal pedal pulses.  Abdomen: Soft, nontender, no hepatosplenomegaly, no distention.  Skin: Intact without lesions or rashes.  Neurologic: Alert and oriented x 3.  Psych: Normal affect. Extremities: No clubbing or cyanosis.  HEENT: Normal.   Assessment/Plan 1. Right heart failure: Echo on 10/15 showed dilated RV with decreased systolic function.  V/Q scan with suspected PE.  3/16 echo showed normal RV size and systolic function with normal estimate PA systolic pressure.  5/16 V/Q scan showed no PE.  She is not volume overloaded on exam, this was confirmed by ReDs vest.  She is not limited by dyspnea (has orthopedic issues that keep her sedentary).  - Continue Lasix 40 mg daily.    2. PE: V/Q scan intermediate probability for PE in 10/15.  Pulmonary saw and suspect that she did indeed have PE.  The PE was spontaneous.  She is at risk given general immobility. No bleeding problems.  She did not have evidence for chronic PE on 5/16 V/Q scan.  Continue apixaban at 2.5 mg bid long-term to decrease long-term risk as she will likely remain fairly immobile long-term due to her arthritis.  BMET/CBC today.  3. CKD: Cut back lisinopril to 5 mg daily.    Loralie Champagne 05/18/2015

## 2015-05-24 ENCOUNTER — Other Ambulatory Visit: Payer: Self-pay | Admitting: Nurse Practitioner

## 2015-06-28 ENCOUNTER — Other Ambulatory Visit: Payer: Self-pay | Admitting: Internal Medicine

## 2015-07-06 ENCOUNTER — Other Ambulatory Visit: Payer: Medicare Other

## 2015-07-06 DIAGNOSIS — I2699 Other pulmonary embolism without acute cor pulmonale: Secondary | ICD-10-CM | POA: Diagnosis not present

## 2015-07-06 DIAGNOSIS — E1122 Type 2 diabetes mellitus with diabetic chronic kidney disease: Secondary | ICD-10-CM

## 2015-07-06 DIAGNOSIS — E785 Hyperlipidemia, unspecified: Secondary | ICD-10-CM

## 2015-07-07 LAB — COMPREHENSIVE METABOLIC PANEL
ALT: 16 IU/L (ref 0–32)
AST: 13 IU/L (ref 0–40)
Albumin/Globulin Ratio: 1.6 (ref 1.1–2.5)
Albumin: 3.9 g/dL (ref 3.5–4.7)
Alkaline Phosphatase: 82 IU/L (ref 39–117)
BUN/Creatinine Ratio: 23 (ref 11–26)
BUN: 31 mg/dL — ABNORMAL HIGH (ref 8–27)
Bilirubin Total: 0.5 mg/dL (ref 0.0–1.2)
CO2: 24 mmol/L (ref 18–29)
Calcium: 10.5 mg/dL — ABNORMAL HIGH (ref 8.7–10.3)
Chloride: 100 mmol/L (ref 97–106)
Creatinine, Ser: 1.36 mg/dL — ABNORMAL HIGH (ref 0.57–1.00)
GFR calc Af Amer: 41 mL/min/{1.73_m2} — ABNORMAL LOW (ref >59)
GFR calc non Af Amer: 36 mL/min/{1.73_m2} — ABNORMAL LOW (ref >59)
Globulin, Total: 2.5 g/dL (ref 1.5–4.5)
Glucose: 149 mg/dL — ABNORMAL HIGH (ref 65–99)
Potassium: 5.4 mmol/L — ABNORMAL HIGH (ref 3.5–5.2)
Sodium: 143 mmol/L (ref 136–144)
Total Protein: 6.4 g/dL (ref 6.0–8.5)

## 2015-07-07 LAB — HEMOGLOBIN A1C
Est. average glucose Bld gHb Est-mCnc: 169 mg/dL
Hgb A1c MFr Bld: 7.5 % — ABNORMAL HIGH (ref 4.8–5.6)

## 2015-07-07 LAB — CBC WITH DIFFERENTIAL/PLATELET
BASOS ABS: 0.1 10*3/uL (ref 0.0–0.2)
Basos: 1 %
EOS (ABSOLUTE): 0.3 10*3/uL (ref 0.0–0.4)
Eos: 3 %
Hematocrit: 39.9 % (ref 34.0–46.6)
Hemoglobin: 13.1 g/dL (ref 11.1–15.9)
IMMATURE GRANS (ABS): 0 10*3/uL (ref 0.0–0.1)
IMMATURE GRANULOCYTES: 0 %
LYMPHS: 25 %
Lymphocytes Absolute: 2.1 10*3/uL (ref 0.7–3.1)
MCH: 29.2 pg (ref 26.6–33.0)
MCHC: 32.8 g/dL (ref 31.5–35.7)
MCV: 89 fL (ref 79–97)
MONOCYTES: 7 %
Monocytes Absolute: 0.6 10*3/uL (ref 0.1–0.9)
NEUTROS ABS: 5.4 10*3/uL (ref 1.4–7.0)
NEUTROS PCT: 64 %
PLATELETS: 200 10*3/uL (ref 150–379)
RBC: 4.49 x10E6/uL (ref 3.77–5.28)
RDW: 13.2 % (ref 12.3–15.4)
WBC: 8.5 10*3/uL (ref 3.4–10.8)

## 2015-07-07 LAB — LIPID PANEL
Chol/HDL Ratio: 2.4 ratio units (ref 0.0–4.4)
Cholesterol, Total: 156 mg/dL (ref 100–199)
HDL: 64 mg/dL (ref >39)
LDL Calculated: 50 mg/dL (ref 0–99)
Triglycerides: 208 mg/dL — ABNORMAL HIGH (ref 0–149)
VLDL Cholesterol Cal: 42 mg/dL — ABNORMAL HIGH (ref 5–40)

## 2015-07-13 ENCOUNTER — Encounter: Payer: Self-pay | Admitting: Pharmacotherapy

## 2015-07-13 ENCOUNTER — Ambulatory Visit (INDEPENDENT_AMBULATORY_CARE_PROVIDER_SITE_OTHER): Payer: Medicare Other | Admitting: Pharmacotherapy

## 2015-07-13 VITALS — BP 130/78 | HR 103 | Temp 97.7°F | Resp 20 | Ht 68.0 in | Wt 234.2 lb

## 2015-07-13 DIAGNOSIS — I10 Essential (primary) hypertension: Secondary | ICD-10-CM

## 2015-07-13 DIAGNOSIS — Z794 Long term (current) use of insulin: Secondary | ICD-10-CM | POA: Diagnosis not present

## 2015-07-13 DIAGNOSIS — E119 Type 2 diabetes mellitus without complications: Secondary | ICD-10-CM | POA: Diagnosis not present

## 2015-07-13 DIAGNOSIS — E1122 Type 2 diabetes mellitus with diabetic chronic kidney disease: Secondary | ICD-10-CM | POA: Diagnosis not present

## 2015-07-13 MED ORDER — INSULIN GLARGINE 100 UNIT/ML SOLOSTAR PEN: 18.0000 [IU] | PEN_INJECTOR | Freq: Every day | SUBCUTANEOUS | Status: DC

## 2015-07-13 NOTE — Patient Instructions (Signed)
Increase Lantus to 18 units daily

## 2015-07-13 NOTE — Progress Notes (Signed)
  Subjective:    Melissa Shannon is a 81 y.o.white female who presents for follow-up of Type 2 diabetes mellitus.   A1C 7.5% (was 7.2%)  Did bring logbook Her BG 140-150mg /dl No hypoglycemia  She is currently taking Lantus 14 units daily. Continues Tradjenta without problems. She has been making healthy food choices.  Appetite is better. No routine exercise. Wears corrective lenses. Has 3+ peripheral edema. Denies problems with vision. Denies problems with feet. Nocturia several times per night - at least 4 times.  Review of Systems A comprehensive review of systems was negative except for: Eyes: positive for contacts/glasses Cardiovascular: positive for lower extremity edema Genitourinary: positive for nocturia    Objective:    BP 130/78 mmHg  Pulse 103  Temp(Src) 97.7 F (36.5 C) (Oral)  Resp 20  Ht 5\' 8"  (1.727 m)  Wt 234 lb 3.2 oz (106.232 kg)  BMI 35.62 kg/m2  SpO2 97%  General:  alert, cooperative and no distress  Oropharynx: normal findings: lips normal without lesions and gums healthy   Eyes:  negative findings: lids and lashes normal and conjunctivae and sclerae normal   Ears:  external ears normal        Lung: clear to auscultation bilaterally  Heart:  regular rate and rhythm     Extremities: edema 3+ bilateral lower extremities  Skin: dry     Neuro: mental status, speech normal, alert and oriented x3 and uses walker for ambulation   Lab Review GLUCOSE (mg/dL)  Date Value  07/06/2015 149*  03/05/2015 137*  12/01/2014 155*   GLUCOSE, BLD (mg/dL)  Date Value  05/18/2015 203*  11/29/2014 129*  11/28/2014 119*   CO2 (mmol/L)  Date Value  07/06/2015 24  05/18/2015 28  03/05/2015 25   BUN (mg/dL)  Date Value  07/06/2015 31*  05/18/2015 42*  03/05/2015 44*  12/01/2014 37*  11/29/2014 27*  11/28/2014 28*   CREATININE, SER (mg/dL)  Date Value  07/06/2015 1.36*  05/18/2015 1.56*  03/05/2015 1.66*       Assessment:    Diabetes  Mellitus type II, under good control.   BP at goal <140/90 Plan:    1.  Rx changes: Increase Lantus to 18 units daily 2.  Continue Tradjenta 5mg  daily 3.  Counseled on nutrition goals. 4.  Counseled on need for routine exercise.  Goal is 30-45 minutes 5 x week. 5.  BP at goal <140/90.

## 2015-07-23 ENCOUNTER — Ambulatory Visit (INDEPENDENT_AMBULATORY_CARE_PROVIDER_SITE_OTHER): Payer: Medicare Other | Admitting: Nurse Practitioner

## 2015-07-23 ENCOUNTER — Encounter: Payer: Self-pay | Admitting: Nurse Practitioner

## 2015-07-23 VITALS — BP 130/80 | HR 103 | Temp 98.1°F | Resp 20 | Ht 68.0 in | Wt 231.2 lb

## 2015-07-23 DIAGNOSIS — Z794 Long term (current) use of insulin: Secondary | ICD-10-CM

## 2015-07-23 DIAGNOSIS — E1165 Type 2 diabetes mellitus with hyperglycemia: Secondary | ICD-10-CM | POA: Diagnosis not present

## 2015-07-23 DIAGNOSIS — I10 Essential (primary) hypertension: Secondary | ICD-10-CM

## 2015-07-23 DIAGNOSIS — N184 Chronic kidney disease, stage 4 (severe): Secondary | ICD-10-CM

## 2015-07-23 DIAGNOSIS — I5032 Chronic diastolic (congestive) heart failure: Secondary | ICD-10-CM

## 2015-07-23 DIAGNOSIS — E1122 Type 2 diabetes mellitus with diabetic chronic kidney disease: Secondary | ICD-10-CM

## 2015-07-23 DIAGNOSIS — IMO0002 Reserved for concepts with insufficient information to code with codable children: Secondary | ICD-10-CM

## 2015-07-23 DIAGNOSIS — I2609 Other pulmonary embolism with acute cor pulmonale: Secondary | ICD-10-CM | POA: Diagnosis not present

## 2015-07-23 DIAGNOSIS — E039 Hypothyroidism, unspecified: Secondary | ICD-10-CM

## 2015-07-23 DIAGNOSIS — E785 Hyperlipidemia, unspecified: Secondary | ICD-10-CM

## 2015-07-23 MED ORDER — INSULIN GLARGINE 100 UNIT/ML SOLOSTAR PEN: 20.0000 [IU] | PEN_INJECTOR | Freq: Every day | SUBCUTANEOUS | Status: DC

## 2015-07-23 NOTE — Patient Instructions (Addendum)
Increase lantus to 20 units daily Diabetic diet    Follow up with Cathey in 3 months and Sherrie Mustache in 6 months for physical  (fasting blood work prior to visit)

## 2015-07-23 NOTE — Progress Notes (Signed)
Patient ID: Melissa Shannon, female   DOB: Sep 14, 1927, 81 y.o.   MRN: [id number]    PCP: Lauree Chandler, NP  Allergies  Allergen Reactions  . Penicillins Other (See Comments)    Throat felt tight    Chief Complaint  Patient presents with  . Medical Management of Chronic Issues     HPI: Patient is a 81 y.o. female seen in the office today for routine follow up; pt with medical hx of DM, HTN, PE, CHF, constipation.  Saw Cathey, pharm D for diabetic management 2 weeks ago. Lantus increased to 18 units. Fasting blood sugars 130-140 No hypoglycemic episodes Following with CHF clinic, weights stable. No increase edema. No shortness of breath.   Reports ongoing fatigue Does not exercise or do a lot of activity during the day Still drives Unable to stand long enough to cook a meal  Advanced Directive information Does patient have an advance directive?: Yes, Type of Advance Directive: [place];Out of facility DNR (pink MOST or yellow form), Does patient want to make changes to advanced directive?: No - Patient declined Review of Systems:  Review of Systems  Constitutional: Negative for activity change, appetite change, fatigue and unexpected weight change.  HENT: Negative for congestion and hearing loss.   Eyes: Negative.   Respiratory: Negative for cough and shortness of breath.   Cardiovascular: Positive for leg swelling (chronic). Negative for chest pain and palpitations.  Gastrointestinal: Negative for abdominal pain, diarrhea and constipation.  Genitourinary: Negative for dysuria and difficulty urinating.       Incontinence of bladder  Musculoskeletal: Positive for gait problem. Negative for myalgias and arthralgias.       Joint stiffness  Skin: Negative for color change and wound.  Neurological: Negative for dizziness and weakness.  Psychiatric/Behavioral: Negative for behavioral problems, confusion, sleep disturbance and agitation.    Past Medical  History  Diagnosis Date  . Hypertension   . Diabetes mellitus without complication ([place])   . Hyperlipidemia   . Urinary incontinence   . Hypothyroidism   . Arthritis     bilateral knees  . Pulmonary embolism ([place])   . Edema   . CHF (congestive heart failure) [facility])    Past Surgical History  Procedure Laterality Date  . Cholecystectomy    . Tonsillectomy and adenoidectomy  1937    Dr.Brewer  . Pilonidal cyst excision  [place] [facility]      bilateral cataracts  . Right elbow  1999    x 2, still has one piece of metal in it  . Bladder surgery  2000    Dr.Tannerbaum (Bladder Tact)  . Cataract extraction  03/2013    Dr.Shapiro   Social History:   reports that she has never smoked. She has never used smokeless tobacco. She reports that she does not drink alcohol or use illicit drugs.  Family History  Problem Relation Age of Onset  . Suicidality Father   . Cancer Brother   . Cancer Maternal Grandfather   . Diabetes Maternal Grandmother   . Melanoma Brother     Medications: Patient's Medications  New Prescriptions   No medications on file  Previous Medications   APIXABAN (ELIQUIS) 5 MG TABS TABLET    Take 0.5 tablets (2.5 mg total) by mouth 2 (two) times daily.   BD PEN NEEDLE NANO U/F 32G X 4 MM MISC    daily. use as directed   CHOLECALCIFEROL (VITAMIN  D3) 5000 UNITS CHEW    Chew 1 tablet by mouth daily.   FEEDING SUPPLEMENT, ENSURE COMPLETE, (ENSURE COMPLETE) LIQD    Take 237 mLs by mouth 2 (two) times daily between meals as needed (As requested by patient).   FUROSEMIDE (LASIX) 40 MG TABLET    Take 40 mg by mouth daily.   HYDRALAZINE (APRESOLINE) 10 MG TABLET    TAKE 1 TABLET BY MOUTH 3 TIMES DAILY   INSULIN GLARGINE (LANTUS SOLOSTAR) 100 UNIT/ML SOLOSTAR PEN    Inject 18 Units into the skin daily.   LEVOTHYROXINE (SYNTHROID, LEVOTHROID) 100 MCG TABLET    TAKE 1 TABLET (100 MCG TOTAL) BY MOUTH DAILY BEFORE BREAKFAST   LISINOPRIL (PRINIVIL,ZESTRIL)  10 MG TABLET    Take 0.5 tablets (5 mg total) by mouth daily.   MULTIPLE VITAMIN (MULTIVITAMIN WITH MINERALS) TABS TABLET    Take 1 tablet by mouth daily.   ONE TOUCH ULTRA TEST TEST STRIP    USE EVERY OTHER DAY TO TEST BLOOD SUGAR   ONETOUCH DELICA LANCETS 99991111 MISC    USE EVERY OTHER DAY TO TEST BLOOD SUGAR   POLYETHYLENE GLYCOL (MIRALAX / GLYCOLAX) PACKET    Take 17 g by mouth as needed for mild constipation.    ROSUVASTATIN (CRESTOR) 20 MG TABLET    Take one tablet by mouth once daily for cholesterol   TRADJENTA 5 MG TABS TABLET    TAKE 1 TABLET BY MOUTH EVERY DAY  Modified Medications   No medications on file  Discontinued Medications   No medications on file     Physical Exam:  Filed Vitals:   07/23/15 1021  BP: 130/80  Pulse: 103  Temp: 98.1 F (36.7 C)  TempSrc: Oral  Resp: 20  Height: 5\' 8"  (1.727 m)  Weight: 231 lb 3.2 oz (104.872 kg)  SpO2: 91%    Physical Exam  Constitutional: She is oriented to person, place, and time. She appears well-developed and well-nourished.  HENT:  Mouth/Throat: Oropharynx is clear and moist. No oropharyngeal exudate.  Eyes: Conjunctivae and EOM are normal. Pupils are equal, round, and reactive to light. No scleral icterus.  Neck: Neck supple.  Cardiovascular: Normal rate, regular rhythm and intact distal pulses.  Exam reveals no gallop and no friction rub.   Murmur heard. Pulmonary/Chest: Effort normal and breath sounds normal.  Abdominal: Soft. Bowel sounds are normal. There is no tenderness.  Musculoskeletal: She exhibits edema (trace bilaterally).  Walks with walker   Neurological: She is alert and oriented to person, place, and time.  Skin: Skin is warm and dry. No rash noted.  Psychiatric: She has a normal mood and affect. Her behavior is normal.    Labs reviewed: Basic Metabolic Panel:  Recent Labs  11/29/14 0650  03/05/15 0855 05/18/15 1200 07/06/15 0908  NA 138  < > 142 139 143  K 3.8  < > 5.2 4.3 5.4*  CL 102  < >  100 102 100  CO2 28  < > 25 28 24   GLUCOSE 129*  < > 137* 203* 149*  BUN 27*  < > 44* 42* 31*  CREATININE 1.49*  < > 1.66* 1.56* 1.36*  CALCIUM 10.1  < > 10.4* 10.0 10.5*  TSH 0.737  --   --   --   --   < > = values in this interval not displayed. Liver Function Tests:  Recent Labs  11/29/14 0650 03/05/15 0855 07/06/15 0908  AST 18 17 13   ALT 12 19  16  ALKPHOS 75 79 82  BILITOT 0.9 0.5 0.5  PROT 7.0 6.4 6.4  ALBUMIN 3.9 4.2 3.9   No results for input(s): LIPASE, AMYLASE in the last 8760 hours. No results for input(s): AMMONIA in the last 8760 hours. CBC:  Recent Labs  11/28/14 1952 11/29/14 0650 05/18/15 1200 07/06/15 0908  WBC 9.3 9.3 9.0 8.5  NEUTROABS 6.6 5.8  --  5.4  HGB 12.7 13.1 13.0  --   HCT 39.4 40.1 41.3 39.9  MCV 89.3 89.3 91.6  --   PLT 200 200 216  --    Lipid Panel:  Recent Labs  09/01/14 0855 10/10/14 0929 07/06/15 0908  CHOL 173 178 156  HDL 61 66 64  LDLCALC 66 72 50  TRIG 232* 198* 208*  CHOLHDL 2.8 2.7 2.4   TSH:  Recent Labs  11/29/14 0650  TSH 0.737   A1C: Lab Results  Component Value Date   HGBA1C 7.5* 07/06/2015     Assessment/Plan  1. Essential hypertension Blood pressure stable, cons on hydralazine, lisinopril and lasix   2. Chronic diastolic congestive heart failure (HCC) Stable, weight have been stable, no increase in edema or shortness of breath  3. Uncontrolled type 2 diabetes mellitus with stage 4 chronic kidney disease, with long-term current use of insulin (HCC) Blood sugars remain elevated, no hypoglycemia  -will increase lantus to 20 units daily -cont on diabetic diet -encourage slow increase in activity   4. CKD (chronic kidney disease) stage 4, GFR 15-29 ml/min (HCC) -BUN/CR stable, labs reviewed with pt.  5. Other acute pulmonary embolism with acute cor pulmonale (HCC) -no signs of recurrence -conts on eliquis 2.5 mg twice daily   6. Hyperlipemia Reviewed lipids with pt. LDL at goal with  elevated triglycerides most likely from uncontrolled diabetes.   7. Hypothyroid -will follow up TSH prior to next visit -conts on synthroid 100 mcg   To follow up in 6 months with lab work prior to visit    Marquel Pottenger K. Harle Battiest  [facility] [phone] 8 am - 5 pm) [phone] (after hours)

## 2015-08-02 ENCOUNTER — Other Ambulatory Visit: Payer: Self-pay | Admitting: Nurse Practitioner

## 2015-08-10 ENCOUNTER — Other Ambulatory Visit: Payer: Self-pay | Admitting: Nurse Practitioner

## 2015-08-29 ENCOUNTER — Other Ambulatory Visit: Payer: Self-pay | Admitting: Nurse Practitioner

## 2015-09-10 ENCOUNTER — Encounter: Payer: Self-pay | Admitting: *Deleted

## 2015-09-10 DIAGNOSIS — E119 Type 2 diabetes mellitus without complications: Secondary | ICD-10-CM | POA: Diagnosis not present

## 2015-09-10 DIAGNOSIS — Z961 Presence of intraocular lens: Secondary | ICD-10-CM | POA: Diagnosis not present

## 2015-09-10 LAB — HM DIABETES EYE EXAM

## 2015-09-13 ENCOUNTER — Other Ambulatory Visit: Payer: Self-pay | Admitting: Internal Medicine

## 2015-09-26 ENCOUNTER — Other Ambulatory Visit: Payer: Self-pay | Admitting: Nurse Practitioner

## 2015-10-01 ENCOUNTER — Telehealth: Payer: Self-pay | Admitting: *Deleted

## 2015-10-01 NOTE — Telephone Encounter (Signed)
Okay change, to stop Lantus when complete and start Levemir 20 units units daily

## 2015-10-01 NOTE — Telephone Encounter (Signed)
Patient called and stated that Insurance will no longer cover her Lantus Insulin. [employer], [place] and patient has to try and fail [place] or [place] or [employer]. Please Advise with Directions.  ID: [id number]

## 2015-10-02 MED ORDER — INSULIN DETEMIR 100 UNIT/ML FLEXPEN: PEN_INJECTOR | SUBCUTANEOUS | Status: DC

## 2015-10-02 NOTE — Telephone Encounter (Signed)
Patient notified and agreed. Faxed change to Pharmacy.

## 2015-10-25 ENCOUNTER — Other Ambulatory Visit: Payer: Self-pay | Admitting: Nurse Practitioner

## 2015-10-30 IMAGING — CR DG CHEST 2V
2 series · 2 of 2 positions shown · non-contrast
Comparison: PA and lateral chest of [DATE] [DATE], [DATE], [DATE] [DATE],
[DATE], and March 11, 2010

CLINICAL DATA: Pre V/Q scan protocol, currently no cough or chest
congestion or other acute symptoms, history of CHF and pulmonary
embolism

EXAM:
CHEST  2 VIEW

[w chest pa]
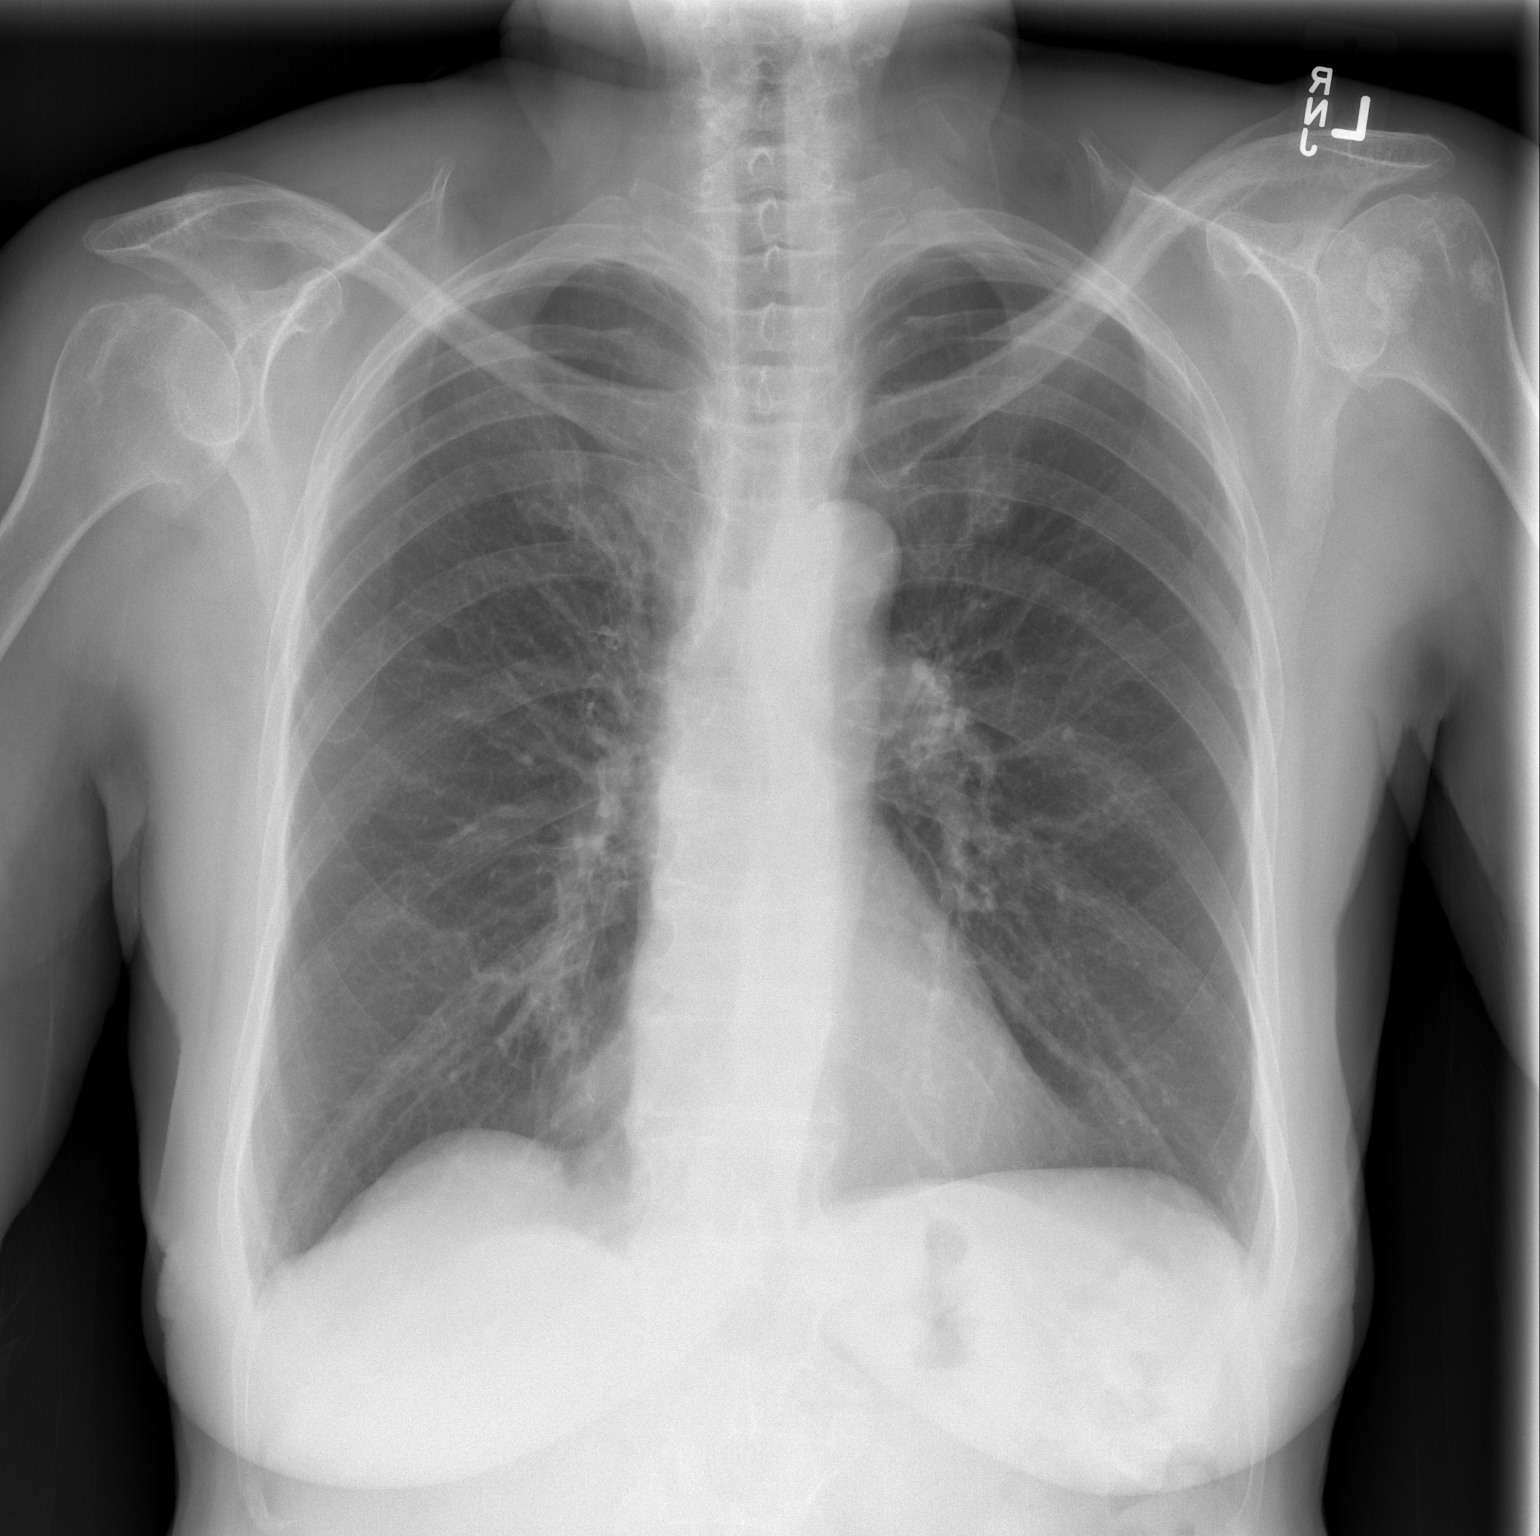

[w chest lat]
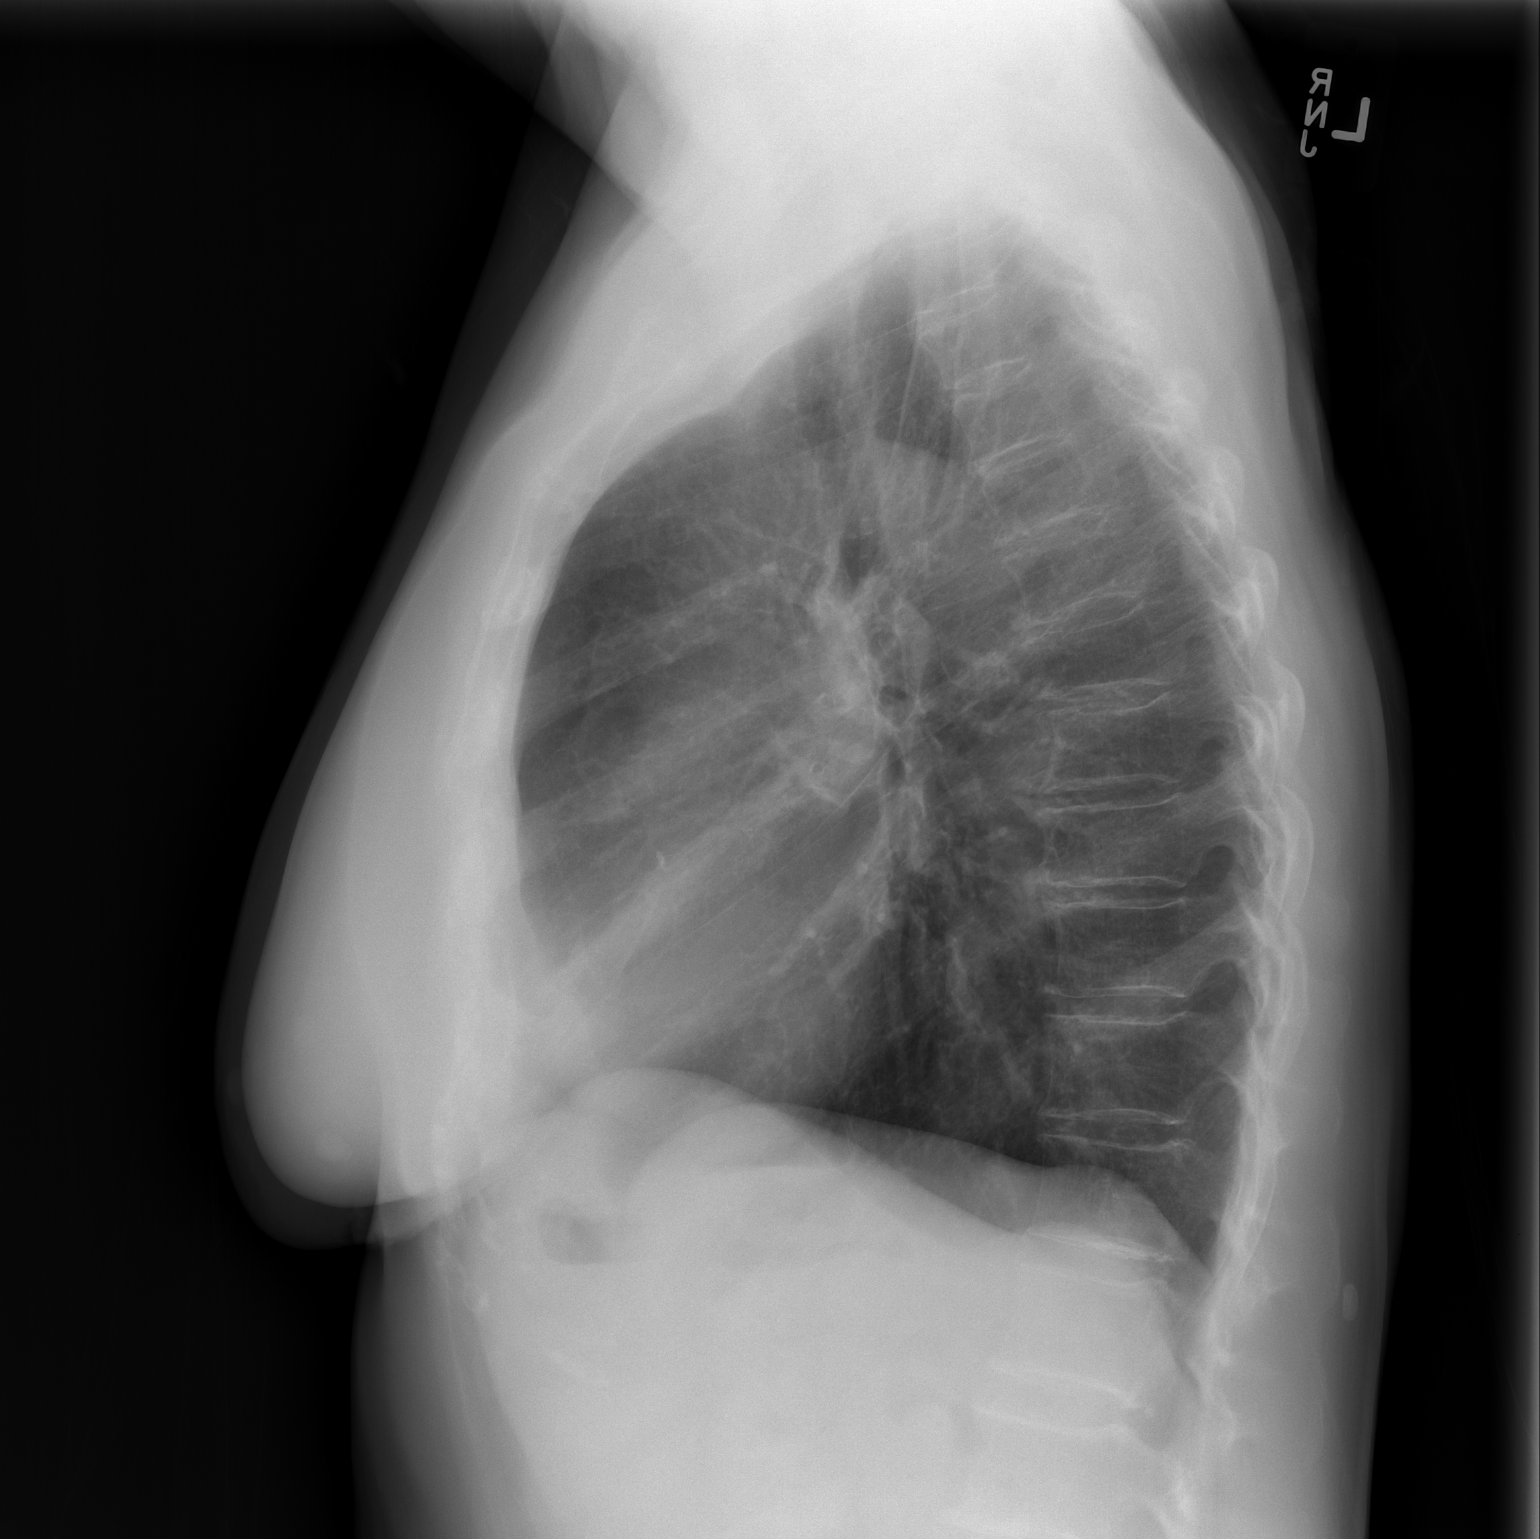

[2 of 2 positions shown; findings below may reference images not displayed]

FINDINGS: The lungs are mildly hyperinflated with hemidiaphragm flattening.
There is no interstitial or alveolar infiltrate. There are no
pulmonary parenchymal masses. The heart and mediastinal structures
are normal. There is no pleural effusion. There are stable sclerotic
foci projecting over the left femoral head.
IMPRESSION: Hyperinflation consistent with COPD. There is no pneumonia, CHF, nor
other acute cardiopulmonary abnormality.

## 2015-11-04 ENCOUNTER — Other Ambulatory Visit: Payer: Self-pay | Admitting: *Deleted

## 2015-11-04 MED ORDER — ONETOUCH ULTRA BLUE VI STRP: ORAL_STRIP | Status: DC

## 2015-11-04 NOTE — Telephone Encounter (Signed)
[facility] 

## 2015-11-05 ENCOUNTER — Other Ambulatory Visit: Payer: Medicare Other

## 2015-11-06 ENCOUNTER — Other Ambulatory Visit: Payer: Medicare Other

## 2015-11-06 DIAGNOSIS — Z794 Long term (current) use of insulin: Principal | ICD-10-CM

## 2015-11-06 DIAGNOSIS — E1122 Type 2 diabetes mellitus with diabetic chronic kidney disease: Secondary | ICD-10-CM

## 2015-11-07 LAB — COMPREHENSIVE METABOLIC PANEL
ALT: 16 IU/L (ref 0–32)
AST: 17 IU/L (ref 0–40)
Albumin/Globulin Ratio: 1.4 (ref 1.2–2.2)
Albumin: 3.8 g/dL (ref 3.5–4.7)
Alkaline Phosphatase: 82 IU/L (ref 39–117)
BUN/Creatinine Ratio: 28 — ABNORMAL HIGH (ref 11–26)
BUN: 38 mg/dL — ABNORMAL HIGH (ref 8–27)
Bilirubin Total: 0.2 mg/dL (ref 0.0–1.2)
CO2: 23 mmol/L (ref 18–29)
Calcium: 10.1 mg/dL (ref 8.7–10.3)
Chloride: 102 mmol/L (ref 96–106)
Creatinine, Ser: 1.34 mg/dL — ABNORMAL HIGH (ref 0.57–1.00)
GFR calc Af Amer: 41 mL/min/{1.73_m2} — ABNORMAL LOW (ref >59)
GFR calc non Af Amer: 36 mL/min/{1.73_m2} — ABNORMAL LOW (ref >59)
Globulin, Total: 2.8 g/dL (ref 1.5–4.5)
Glucose: 131 mg/dL — ABNORMAL HIGH (ref 65–99)
Potassium: 4.3 mmol/L (ref 3.5–5.2)
Sodium: 142 mmol/L (ref 134–144)
Total Protein: 6.6 g/dL (ref 6.0–8.5)

## 2015-11-07 LAB — HEMOGLOBIN A1C
Est. average glucose Bld gHb Est-mCnc: 157 mg/dL
Hgb A1c MFr Bld: 7.1 % — ABNORMAL HIGH (ref 4.8–5.6)

## 2015-11-09 ENCOUNTER — Ambulatory Visit (INDEPENDENT_AMBULATORY_CARE_PROVIDER_SITE_OTHER): Payer: Medicare Other | Admitting: Pharmacotherapy

## 2015-11-09 ENCOUNTER — Encounter: Payer: Self-pay | Admitting: Pharmacotherapy

## 2015-11-09 VITALS — BP 130/86 | HR 91 | Temp 98.2°F | Resp 20 | Ht 68.0 in | Wt 238.8 lb

## 2015-11-09 DIAGNOSIS — Z794 Long term (current) use of insulin: Secondary | ICD-10-CM | POA: Diagnosis not present

## 2015-11-09 DIAGNOSIS — N184 Chronic kidney disease, stage 4 (severe): Secondary | ICD-10-CM

## 2015-11-09 DIAGNOSIS — E1122 Type 2 diabetes mellitus with diabetic chronic kidney disease: Secondary | ICD-10-CM | POA: Diagnosis not present

## 2015-11-09 DIAGNOSIS — I10 Essential (primary) hypertension: Secondary | ICD-10-CM

## 2015-11-09 DIAGNOSIS — E1165 Type 2 diabetes mellitus with hyperglycemia: Secondary | ICD-10-CM

## 2015-11-09 DIAGNOSIS — IMO0002 Reserved for concepts with insufficient information to code with codable children: Secondary | ICD-10-CM

## 2015-11-09 NOTE — Progress Notes (Signed)
  Subjective:    Melissa Shannon is a 82 y.o.white female who presents for follow-up of Type 2 diabetes mellitus.   A1C 7.1% (was 7.5%) Kidney function is stable.  Logbook BG:  111-140mg /dl No hypoglycemia.  She is on Levemir 20 units daily. Continues Tradjenta without problems.  Her appetite has improved.  She is trying to follow a healthy diet.  Limits sodium and potassium.  Limits her starch choices and choosing lean proteins.  Her exercise is limited to chair exercises. Has significant lower extremity edema. Denies problems with feet. Denies problems with vision.  Wears glasses.  Saw Dr. Gershon Crane early this year - no evidence of retinopathy. Nocturia once, if at all, per night.   Review of Systems A comprehensive review of systems was negative except for: Eyes: positive for contacts/glasses Cardiovascular: positive for lower extremity edema Genitourinary: positive for nocturia    Objective:    BP 130/86 mmHg  Pulse 91  Temp(Src) 98.2 F (36.8 C) (Oral)  Resp 20  Ht 5\' 8"  (1.727 m)  Wt 238 lb 12.8 oz (108.319 kg)  BMI 36.32 kg/m2  SpO2 95%  General:  alert, cooperative and no distress  Oropharynx: normal findings: lips normal without lesions and gums healthy   Eyes:  negative findings: lids and lashes normal and conjunctivae and sclerae normal   Ears:  external ears normal        Lung: clear to auscultation bilaterally  Heart:  regular rate and rhythm     Extremities: edema lower extremities  Skin: dry     Neuro: mental status, speech normal, alert and oriented x3 and Slow, but steady gait with her walker.   Lab Review GLUCOSE (mg/dL)  Date Value  11/06/2015 131*  07/06/2015 149*  03/05/2015 137*   GLUCOSE, BLD (mg/dL)  Date Value  05/18/2015 203*  11/29/2014 129*  11/28/2014 119*   CO2 (mmol/L)  Date Value  11/06/2015 23  07/06/2015 24  05/18/2015 28   BUN (mg/dL)  Date Value  11/06/2015 38*  07/06/2015 31*  05/18/2015 42*  03/05/2015 44*   11/29/2014 27*  11/28/2014 28*   CREATININE, SER (mg/dL)  Date Value  11/06/2015 1.34*  07/06/2015 1.36*  05/18/2015 1.56*       Assessment:    Diabetes Mellitus type II, under good control.  A1C is good at 7.1% BP at goal <140/90   Plan:    1.  Rx changes: none 2.  Continue Levemir 20 units daily. 3.  Continue Tradjenta 5mg  daily. 4.  Counseled on nutrition goals. 5.  Counseled on need for routine exercise.  Encouraged her to continue her chair exercise.  Goal is 30-45 minutes 5 x week. 6.  HTN at goal <140/90 7.  RTC in 3 months

## 2015-11-09 NOTE — Patient Instructions (Signed)
Keep up the good work! Do your chair exercises daily.

## 2015-11-16 ENCOUNTER — Other Ambulatory Visit: Payer: Self-pay | Admitting: Nurse Practitioner

## 2015-11-29 ENCOUNTER — Other Ambulatory Visit: Payer: Self-pay | Admitting: Nurse Practitioner

## 2015-12-07 ENCOUNTER — Other Ambulatory Visit: Payer: Self-pay | Admitting: Nurse Practitioner

## 2015-12-09 ENCOUNTER — Encounter: Payer: Self-pay | Admitting: Pharmacotherapy

## 2016-01-12 ENCOUNTER — Other Ambulatory Visit: Payer: Self-pay | Admitting: Nurse Practitioner

## 2016-01-18 ENCOUNTER — Encounter: Payer: Self-pay | Admitting: Nurse Practitioner

## 2016-01-18 ENCOUNTER — Ambulatory Visit (INDEPENDENT_AMBULATORY_CARE_PROVIDER_SITE_OTHER): Payer: Medicare Other | Admitting: Nurse Practitioner

## 2016-01-18 VITALS — BP 128/68 | HR 93 | Temp 98.1°F | Resp 17 | Ht 68.0 in | Wt 240.6 lb

## 2016-01-18 DIAGNOSIS — E039 Hypothyroidism, unspecified: Secondary | ICD-10-CM

## 2016-01-18 DIAGNOSIS — I2609 Other pulmonary embolism with acute cor pulmonale: Secondary | ICD-10-CM

## 2016-01-18 DIAGNOSIS — I5032 Chronic diastolic (congestive) heart failure: Secondary | ICD-10-CM | POA: Diagnosis not present

## 2016-01-18 DIAGNOSIS — E1122 Type 2 diabetes mellitus with diabetic chronic kidney disease: Secondary | ICD-10-CM

## 2016-01-18 DIAGNOSIS — E1165 Type 2 diabetes mellitus with hyperglycemia: Secondary | ICD-10-CM

## 2016-01-18 DIAGNOSIS — K5900 Constipation, unspecified: Secondary | ICD-10-CM

## 2016-01-18 DIAGNOSIS — Z794 Long term (current) use of insulin: Secondary | ICD-10-CM

## 2016-01-18 DIAGNOSIS — N184 Chronic kidney disease, stage 4 (severe): Secondary | ICD-10-CM

## 2016-01-18 DIAGNOSIS — R5382 Chronic fatigue, unspecified: Secondary | ICD-10-CM | POA: Diagnosis not present

## 2016-01-18 DIAGNOSIS — IMO0002 Reserved for concepts with insufficient information to code with codable children: Secondary | ICD-10-CM

## 2016-01-18 NOTE — Progress Notes (Signed)
Patient ID: Melissa Shannon, female   DOB: [birth year], 82 y.o.   MRN: [id number]    PCP: Lauree Chandler, NP  Allergies  Allergen Reactions  . Penicillins Other (See Comments)    Throat felt tight    Chief Complaint  Patient presents with  . Acute Visit    SOB x 3 wks with walking short distance: gets tired easily, more sleepy than normal during the day.      HPI: Patient is a 82 y.o. female seen in the office today due to worsening shortness of breath. Pt with medical hx of DM, HTN, obesity, PE, CHF, constipation.  Does not feel well today  Reports increase shortness of breath that has progressively getting worse. Not short of breath when sitting but short of breath with any activity.  Reports increase swelling to lower legs  Very sedentary lifestyle. Sits around most of the day Increase knee pain.  Has chair and bed exercises but she does not do these as much as she should. No increase in shortness of breath with these exercises.  Has no been to the CHF clinic recently. Reports she is overdue.  Reports some increase in swelling to LE.  Weight up 2 lbs since last visit in March. She reports increase appetite and eating more.   Chronic fatigue but reports this is worse than normal.  Sleeps flat, no increase shortness of breath at night Bowels moving well generally but does report some constipation. No dark tarry stools.   Advanced Directive information Does patient have an advance directive?: Yes, Type of Advance Directive: [place];Living will;Out of facility DNR (pink MOST or yellow form) Review of Systems:  Review of Systems  Constitutional: Negative for activity change, appetite change, fatigue and unexpected weight change.  HENT: Negative for congestion and hearing loss.   Eyes: Negative.   Respiratory: Positive for shortness of breath. Negative for cough.   Cardiovascular: Positive for leg swelling (chronic). Negative for chest pain and  palpitations.  Gastrointestinal: Negative for abdominal pain, diarrhea and constipation.  Genitourinary: Negative for dysuria and difficulty urinating.       Incontinence of bladder  Musculoskeletal: Positive for gait problem (uses walker). Negative for myalgias and arthralgias.       Joint stiffness  Skin: Negative for color change and wound.  Neurological: Negative for dizziness and weakness.  Psychiatric/Behavioral: Negative for behavioral problems, confusion, sleep disturbance and agitation.    Past Medical History  Diagnosis Date  . Hypertension   . Diabetes mellitus without complication ([place])   . Hyperlipidemia   . Urinary incontinence   . Hypothyroidism   . Arthritis     bilateral knees  . Pulmonary embolism ([place])   . Edema   . CHF (congestive heart failure) [facility])    Past Surgical History  Procedure Laterality Date  . Cholecystectomy    . Tonsillectomy and adenoidectomy  1937    Dr.Brewer  . Pilonidal cyst excision  [place] [facility]      bilateral cataracts  . Right elbow  1999    x 2, still has one piece of metal in it  . Bladder surgery  2000    Dr.Tannerbaum (Bladder Tact)  . Cataract extraction  03/2013    Dr.Shapiro   Social History:   reports that she has never smoked. She has never used smokeless tobacco. She reports that she does not drink alcohol or use illicit drugs.  Family  History  Problem Relation Age of Onset  . Suicidality Father   . Cancer Brother   . Cancer Maternal Grandfather   . Diabetes Maternal Grandmother   . Melanoma Brother     Medications: Patient's Medications  New Prescriptions   No medications on file  Previous Medications   APIXABAN (ELIQUIS) 5 MG TABS TABLET    Take 0.5 tablets (2.5 mg total) by mouth 2 (two) times daily.   BD PEN NEEDLE NANO U/F 32G X 4 MM MISC    daily. use as directed   BD PEN NEEDLE NANO U/F 32G X 4 MM MISC    USE AS DIRECTED ONCE DAILY   CHOLECALCIFEROL (VITAMIN D3) 5000 UNITS  CHEW    Chew 1 tablet by mouth daily.   FEEDING SUPPLEMENT, ENSURE COMPLETE, (ENSURE COMPLETE) LIQD    Take 237 mLs by mouth 2 (two) times daily between meals as needed (As requested by patient).   FUROSEMIDE (LASIX) 40 MG TABLET    Take 40 mg by mouth daily.   HYDRALAZINE (APRESOLINE) 10 MG TABLET    TAKE 1 TABLET BY MOUTH 3 TIMES DAILY   INSULIN DETEMIR (LEVEMIR FLEXPEN) 100 UNIT/ML PEN    Inject 20 units into the skin once daily to control blood sugar   LEVOTHYROXINE (SYNTHROID, LEVOTHROID) 100 MCG TABLET    TAKE 1 TABLET (100 MCG TOTAL) BY MOUTH DAILY BEFORE BREAKFAST   LISINOPRIL (PRINIVIL,ZESTRIL) 10 MG TABLET    Take 0.5 tablets (5 mg total) by mouth daily.   MULTIPLE VITAMIN (MULTIVITAMIN WITH MINERALS) TABS TABLET    Take 1 tablet by mouth daily.   ONE TOUCH ULTRA TEST TEST STRIP    Use every other day to test blood sugar   ONETOUCH DELICA LANCETS 99991111 MISC    USE EVERY OTHER DAY TO TEST BLOOD SUGAR   POLYETHYLENE GLYCOL (MIRALAX / GLYCOLAX) PACKET    Take 17 g by mouth as needed for mild constipation.    ROSUVASTATIN (CRESTOR) 20 MG TABLET    Take one tablet by mouth once daily for cholesterol   TRADJENTA 5 MG TABS TABLET    TAKE 1 TABLET BY MOUTH EVERY DAY  Modified Medications   No medications on file  Discontinued Medications   ELIQUIS 5 MG TABS TABLET    TAKE 1 TABLET TWICE A DAY   FUROSEMIDE (LASIX) 40 MG TABLET    TAKE 1 TABLET BY MOUTH EVERY DAY   LISINOPRIL (PRINIVIL,ZESTRIL) 10 MG TABLET    TAKE 1 TABLET (10 MG TOTAL) BY MOUTH DAILY.     Physical Exam:  Filed Vitals:   01/18/16 1416  BP: 128/68  Pulse: 93  Temp: 98.1 F (36.7 C)  TempSrc: Oral  Resp: 17  Height: 5\' 8"  (1.727 m)  Weight: 240 lb 9.6 oz (109.135 kg)  SpO2: 95%    Physical Exam  Constitutional: She is oriented to person, place, and time. She appears well-developed and well-nourished.  HENT:  Mouth/Throat: Oropharynx is clear and moist. No oropharyngeal exudate.  Eyes: Conjunctivae and EOM are  normal. Pupils are equal, round, and reactive to light. No scleral icterus.  Neck: Neck supple.  Cardiovascular: Normal rate, regular rhythm and intact distal pulses.  Exam reveals no gallop and no friction rub.   Murmur heard. Pulmonary/Chest: Effort normal and breath sounds normal.  Abdominal: Soft. Bowel sounds are normal. There is no tenderness.  Musculoskeletal: She exhibits edema (trace bilaterally).  Walks with walker   Neurological: She is alert and oriented  to person, place, and time.  Skin: Skin is warm and dry. No rash noted.  Psychiatric: She has a normal mood and affect. Her behavior is normal.    Labs reviewed: Basic Metabolic Panel:  Recent Labs  05/18/15 1200 07/06/15 0908 11/06/15 0919  NA 139 143 142  K 4.3 5.4* 4.3  CL 102 100 102  CO2 28 24 23   GLUCOSE 203* 149* 131*  BUN 42* 31* 38*  CREATININE 1.56* 1.36* 1.34*  CALCIUM 10.0 10.5* 10.1   Liver Function Tests:  Recent Labs  03/05/15 0855 07/06/15 0908 11/06/15 0919  AST 17 13 17   ALT 19 16 16   ALKPHOS 79 82 82  BILITOT 0.5 0.5 0.2  PROT 6.4 6.4 6.6  ALBUMIN 4.2 3.9 3.8   No results for input(s): LIPASE, AMYLASE in the last 8760 hours. No results for input(s): AMMONIA in the last 8760 hours. CBC:  Recent Labs  05/18/15 1200 07/06/15 0908  WBC 9.0 8.5  NEUTROABS  --  5.4  HGB 13.0  --   HCT 41.3 39.9  MCV 91.6 89  PLT 216 200   Lipid Panel:  Recent Labs  07/06/15 0908  CHOL 156  HDL 64  LDLCALC 50  TRIG 208*  CHOLHDL 2.4   TSH: No results for input(s): TSH in the last 8760 hours. A1C: Lab Results  Component Value Date   HGBA1C 7.1* 11/06/2015     Assessment/Plan  1. CKD (chronic kidney disease) stage 4, GFR 15-29 ml/min (HCC) - will follow up labs, Comprehensive metabolic panel  2. Other acute pulmonary embolism with acute cor pulmonale (HCC) -conts on eliquis  -will follow up CBC to make sure pt does not have drop in hgb leading to increase fatigue  - CBC with  Differential  3. Uncontrolled type 2 diabetes mellitus with stage 4 chronic kidney disease, with long-term current use of insulin (HCC) conts on tradjenta and levemir, will follow up A1c at this time - Hemoglobin A1c  4. Hypothyroidism, unspecified hypothyroidism type -conts on synthroid, will follow up TSH to see if this is contributing to fatigue. - TSH  5. Constipation, unspecified constipation type Taking miralax every other day, to increase to miralax daily   6. Chronic diastolic congestive heart failure (HCC) Weight overall stable, to follow up with heart failure clinic since she is due. No increase in edema, evolumic on exam. conts on lasix, lisinopril  -walks in hallways without increase in shortness of breath. O2 remains stable.   7. Fatigue Suspect fatigue and shortness of breath are due to inactivity and sedentary lifestyle. Pt with pain in knees which also limits mobility and reports this is causing some of her issues. Will follow up CBC, CMP, TSH and A1c at this time.  Carlos American. Harle Battiest  [facility] [phone] 8 am - 5 pm) [phone] (after hours)

## 2016-01-18 NOTE — Patient Instructions (Signed)
We will get lab work today to follow up thyroid, blood count and electrolytes-- we will call with results  Keep up coming lab and follow up appt.

## 2016-01-31 ENCOUNTER — Other Ambulatory Visit: Payer: Self-pay | Admitting: Internal Medicine

## 2016-01-31 ENCOUNTER — Other Ambulatory Visit: Payer: Self-pay | Admitting: Nurse Practitioner

## 2016-02-08 ENCOUNTER — Other Ambulatory Visit: Payer: Medicare Other

## 2016-02-08 DIAGNOSIS — N184 Chronic kidney disease, stage 4 (severe): Secondary | ICD-10-CM

## 2016-02-08 DIAGNOSIS — Z794 Long term (current) use of insulin: Secondary | ICD-10-CM | POA: Diagnosis not present

## 2016-02-08 DIAGNOSIS — E1165 Type 2 diabetes mellitus with hyperglycemia: Secondary | ICD-10-CM

## 2016-02-08 DIAGNOSIS — E1122 Type 2 diabetes mellitus with diabetic chronic kidney disease: Secondary | ICD-10-CM | POA: Diagnosis not present

## 2016-02-08 DIAGNOSIS — E785 Hyperlipidemia, unspecified: Secondary | ICD-10-CM

## 2016-02-08 DIAGNOSIS — IMO0002 Reserved for concepts with insufficient information to code with codable children: Secondary | ICD-10-CM

## 2016-02-09 LAB — LIPID PANEL
CHOLESTEROL TOTAL: 164 mg/dL (ref 100–199)
Chol/HDL Ratio: 2.5 ratio units (ref 0.0–4.4)
HDL: 65 mg/dL (ref >39)
LDL Calculated: 64 mg/dL (ref 0–99)
Triglycerides: 175 mg/dL — ABNORMAL HIGH (ref 0–149)
VLDL Cholesterol Cal: 35 mg/dL (ref 5–40)

## 2016-02-09 LAB — COMPREHENSIVE METABOLIC PANEL
ALT: 8 IU/L (ref 0–32)
AST: 12 IU/L (ref 0–40)
Albumin/Globulin Ratio: 1.6 (ref 1.2–2.2)
Albumin: 3.9 g/dL (ref 3.5–4.7)
Alkaline Phosphatase: 74 IU/L (ref 39–117)
BUN/Creatinine Ratio: 23 (ref 12–28)
BUN: 29 mg/dL — ABNORMAL HIGH (ref 8–27)
Bilirubin Total: 0.5 mg/dL (ref 0.0–1.2)
CO2: 23 mmol/L (ref 18–29)
Calcium: 10.2 mg/dL (ref 8.7–10.3)
Chloride: 104 mmol/L (ref 96–106)
Creatinine, Ser: 1.25 mg/dL — ABNORMAL HIGH (ref 0.57–1.00)
GFR calc Af Amer: 45 mL/min/{1.73_m2} — ABNORMAL LOW (ref >59)
GFR calc non Af Amer: 39 mL/min/{1.73_m2} — ABNORMAL LOW (ref >59)
Globulin, Total: 2.4 g/dL (ref 1.5–4.5)
Glucose: 126 mg/dL — ABNORMAL HIGH (ref 65–99)
Potassium: 4.6 mmol/L (ref 3.5–5.2)
Sodium: 144 mmol/L (ref 134–144)
Total Protein: 6.3 g/dL (ref 6.0–8.5)

## 2016-02-09 LAB — HEMOGLOBIN A1C
Est. average glucose Bld gHb Est-mCnc: 177 mg/dL
Hgb A1c MFr Bld: 7.8 % — ABNORMAL HIGH (ref 4.8–5.6)

## 2016-02-11 ENCOUNTER — Ambulatory Visit (INDEPENDENT_AMBULATORY_CARE_PROVIDER_SITE_OTHER): Payer: Medicare Other | Admitting: Nurse Practitioner

## 2016-02-11 ENCOUNTER — Encounter: Payer: Self-pay | Admitting: Nurse Practitioner

## 2016-02-11 VITALS — BP 126/78 | HR 97 | Temp 98.0°F | Resp 17 | Ht 68.0 in | Wt 240.0 lb

## 2016-02-11 DIAGNOSIS — Z0000 Encounter for general adult medical examination without abnormal findings: Secondary | ICD-10-CM | POA: Diagnosis not present

## 2016-02-11 DIAGNOSIS — E785 Hyperlipidemia, unspecified: Secondary | ICD-10-CM

## 2016-02-11 DIAGNOSIS — I10 Essential (primary) hypertension: Secondary | ICD-10-CM | POA: Diagnosis not present

## 2016-02-11 DIAGNOSIS — E1122 Type 2 diabetes mellitus with diabetic chronic kidney disease: Secondary | ICD-10-CM | POA: Diagnosis not present

## 2016-02-11 DIAGNOSIS — I5032 Chronic diastolic (congestive) heart failure: Secondary | ICD-10-CM

## 2016-02-11 DIAGNOSIS — Z794 Long term (current) use of insulin: Secondary | ICD-10-CM | POA: Diagnosis not present

## 2016-02-11 DIAGNOSIS — N184 Chronic kidney disease, stage 4 (severe): Secondary | ICD-10-CM | POA: Diagnosis not present

## 2016-02-11 DIAGNOSIS — E1165 Type 2 diabetes mellitus with hyperglycemia: Secondary | ICD-10-CM

## 2016-02-11 DIAGNOSIS — IMO0002 Reserved for concepts with insufficient information to code with codable children: Secondary | ICD-10-CM

## 2016-02-11 NOTE — Progress Notes (Signed)
Patient ID: Melissa Shannon, female   DOB: 13-Apr-1928, 82 y.o.   MRN: [id number]    PCP: Lauree Chandler, NP  Advanced Directive information Does patient have an advance directive?: No, Would patient like information on creating an advanced directive?: No - patient declined information  Allergies  Allergen Reactions  . Penicillins Other (See Comments)    Throat felt tight    Chief Complaint  Patient presents with  . Medical Management of Chronic Issues    Annual physical. Discuss labs (printed): Passed clock drawing  . OTHER    Microalbumin sample collected  . OTHER    Caregiver, Aldona Bar, in room     HPI: Patient is a 82 y.o. female seen in the office today for annual exam.  Screenings: Colon Cancer- aged out, last done in 2011 Breast Orient 2016, does not wish to continue screening.  Cervical Cancer- aged out Osteoporosis- Dexa Scan done in 2016 Depression screening Depression screen [facility] 2/9 02/11/2016 01/18/2016 09/04/2014 07/24/2014  Decreased Interest 0 0 0 0  Down, Depressed, Hopeless 0 0 0 0  PHQ - 2 Score 0 0 0 0  no anxiety or depression  Falls Fall Risk  02/11/2016 01/18/2016 11/09/2015 07/23/2015 03/19/2015  Falls in the past year? No No No No No   MMSE MMSE - Mini Mental State Exam 02/11/2016 09/04/2014  Orientation to time 5 5  Orientation to Place 5 5  Registration 3 3  Attention/ Calculation 5 5  Recall 2 3  Language- name 2 objects 2 2  Language- repeat 1 1  Language- follow 3 step command 3 3  Language- read & follow direction 1 1  Write a sentence 1 1  Copy design 1 1  Total score 29 30   Vaccines Immunization History  Administered Date(s) Administered  . Pneumococcal Polysaccharide-23 04/06/2009  . Td 04/06/2009   Diabetes: due for foot exam, eye exam done 08/2015 On lisinopril for renal protection  Smoking status:.never Alcohol use: none  Dentist: does not go Ophthalmologist: yearly  Exercise regimen: chair exercises and  walking, "not enough"   Diet: low sodium diet, low potassium diet   Following with heart clinic for CHF- July 20th  Functional Status of ADLs:  Toileting- independent  Bathing-assistance Dressing-assistance  Incontinence-of urine   Functional Status Survey: Is the patient deaf or have difficulty hearing?: Yes Does the patient have difficulty seeing, even when wearing glasses/contacts?: No Does the patient have difficulty concentrating, remembering, or making decisions?: No Does the patient have difficulty walking or climbing stairs?: Yes (OA in knees) Does the patient have difficulty dressing or bathing?: Yes (has home aids to help) Does the patient have difficulty doing errands alone such as visiting a doctor's office or shopping?: Yes (uses home aids to help)  Review of Systems:  Review of Systems  Constitutional: Negative for activity change, appetite change, fatigue and unexpected weight change.  HENT: Negative for congestion and hearing loss.   Eyes: Negative.   Respiratory: Negative for cough and shortness of breath.   Cardiovascular: Negative for chest pain, palpitations and leg swelling.  Gastrointestinal: Negative for abdominal pain, diarrhea and constipation.  Genitourinary: Negative for dysuria and difficulty urinating.       Incontinence of bladder  Musculoskeletal: Positive for gait problem (uses walker). Negative for myalgias and arthralgias.       Joint stiffness  Skin: Negative for color change and wound.  Neurological: Negative for dizziness and weakness.  Psychiatric/Behavioral: Negative for behavioral problems, confusion,  sleep disturbance and agitation.    Past Medical History  Diagnosis Date  . Hypertension   . Diabetes mellitus without complication ([place])   . Hyperlipidemia   . Urinary incontinence   . Hypothyroidism   . Arthritis     bilateral knees  . Pulmonary embolism ([place])   . Edema   . CHF (congestive heart failure) [facility])    Past Surgical  History  Procedure Laterality Date  . Cholecystectomy    . Tonsillectomy and adenoidectomy  1937    Dr.Brewer  . Pilonidal cyst excision  [facility]      bilateral cataracts  . Right elbow  1999    x 2, still has one piece of metal in it  . Bladder surgery  2000    Dr.Tannerbaum (Bladder Tact)  . Cataract extraction  03/2013    Dr.Shapiro   Social History:   reports that she has never smoked. She has never used smokeless tobacco. She reports that she does not drink alcohol or use illicit drugs.  Family History  Problem Relation Age of Onset  . Suicidality Father   . Cancer Brother   . Cancer Maternal Grandfather   . Diabetes Maternal Grandmother   . Melanoma Brother     Medications: Patient's Medications  New Prescriptions   No medications on file  Previous Medications   APIXABAN (ELIQUIS) 5 MG TABS TABLET    Take 0.5 tablets (2.5 mg total) by mouth 2 (two) times daily.   BD PEN NEEDLE NANO U/F 32G X 4 MM MISC    daily. use as directed   CHOLECALCIFEROL (VITAMIN D3) 5000 UNITS CHEW    Chew 1 tablet by mouth daily.   FEEDING SUPPLEMENT, ENSURE COMPLETE, (ENSURE COMPLETE) LIQD    Take 237 mLs by mouth 2 (two) times daily between meals as needed (As requested by patient).   FUROSEMIDE (LASIX) 40 MG TABLET    Take 40 mg by mouth daily.   HYDRALAZINE (APRESOLINE) 10 MG TABLET    TAKE 1 TABLET BY MOUTH 3 TIMES DAILY   INSULIN DETEMIR (LEVEMIR FLEXPEN) 100 UNIT/ML PEN    Inject 20 units into the skin once daily to control blood sugar   LEVOTHYROXINE (SYNTHROID, LEVOTHROID) 100 MCG TABLET    TAKE 1 TABLET (100 MCG TOTAL) BY MOUTH DAILY BEFORE BREAKFAST   LISINOPRIL (PRINIVIL,ZESTRIL) 5 MG TABLET    Take 5 mg by mouth 2 (two) times daily.   MULTIPLE VITAMIN (MULTIVITAMIN WITH MINERALS) TABS TABLET    Take 1 tablet by mouth daily.   ONE TOUCH ULTRA TEST TEST STRIP    Use every other day to test blood sugar   ONETOUCH DELICA LANCETS 99991111 MISC    USE EVERY  OTHER DAY TO TEST BLOOD SUGAR   POLYETHYLENE GLYCOL (MIRALAX / GLYCOLAX) PACKET    Take 17 g by mouth as needed for mild constipation.    ROSUVASTATIN (CRESTOR) 20 MG TABLET    Take one tablet by mouth once daily for cholesterol   TRADJENTA 5 MG TABS TABLET    TAKE 1 TABLET BY MOUTH EVERY DAY  Modified Medications   No medications on file  Discontinued Medications   BD PEN NEEDLE NANO U/F 32G X 4 MM MISC    USE AS DIRECTED ONCE DAILY   LISINOPRIL (PRINIVIL,ZESTRIL) 10 MG TABLET    Take 0.5 tablets (5 mg total) by mouth daily.   ROSUVASTATIN (CRESTOR) 20 MG TABLET  TAKE ONE TABLET BY MOUTH ONCE DAILY FOR CHOLESTEROL     Physical Exam:  Filed Vitals:   02/11/16 1051  BP: 126/78  Pulse: 97  Temp: 98 F (36.7 C)  TempSrc: Oral  Resp: 17  Height: 5\' 8"  (1.727 m)  Weight: 240 lb (108.863 kg)  SpO2: 90%   Body mass index is 36.5 kg/(m^2).  Physical Exam  Constitutional: She is oriented to person, place, and time. She appears well-developed and well-nourished.  HENT:  Mouth/Throat: Oropharynx is clear and moist. No oropharyngeal exudate.  Eyes: Conjunctivae and EOM are normal. Pupils are equal, round, and reactive to light. No scleral icterus.  Neck: Neck supple.  Cardiovascular: Normal rate, regular rhythm and intact distal pulses.  Exam reveals no gallop and no friction rub.   Murmur heard. Pulmonary/Chest: Effort normal and breath sounds normal.  Abdominal: Soft. Bowel sounds are normal. There is no tenderness.  Musculoskeletal: She exhibits no edema.  Walks with walker   Neurological: She is alert and oriented to person, place, and time.  Skin: Skin is warm and dry. No rash noted.  Psychiatric: She has a normal mood and affect. Her behavior is normal.    Labs reviewed: Basic Metabolic Panel:  Recent Labs  11/06/15 0919 01/18/16 1454 02/08/16 0921  NA 142 142 144  K 4.3 4.5 4.6  CL 102 98 104  CO2 23 25 23   GLUCOSE 131* 180* 126*  BUN 38* 33* 29*  CREATININE  1.34* 1.36* 1.25*  CALCIUM 10.1 10.2 10.2  TSH  --  1.280  --    Liver Function Tests:  Recent Labs  11/06/15 0919 01/18/16 1454 02/08/16 0921  AST 17 11 12   ALT 16 8 8   ALKPHOS 82 76 74  BILITOT 0.2 0.4 0.5  PROT 6.6 6.8 6.3  ALBUMIN 3.8 4.0 3.9   No results for input(s): LIPASE, AMYLASE in the last 8760 hours. No results for input(s): AMMONIA in the last 8760 hours. CBC:  Recent Labs  05/18/15 1200 07/06/15 0908 01/18/16 1454  WBC 9.0 8.5 9.0  NEUTROABS  --  5.4 6.1  HGB 13.0  --   --   HCT 41.3 39.9 42.0  MCV 91.6 89 90  PLT 216 200 200   Lipid Panel:  Recent Labs  07/06/15 0908 02/08/16 0921  CHOL 156 164  HDL 64 65  LDLCALC 50 64  TRIG 208* 175*  CHOLHDL 2.4 2.5   TSH:  Recent Labs  01/18/16 1454  TSH 1.280   A1C: Lab Results  Component Value Date   HGBA1C 7.8* 02/08/2016     Assessment/Plan 1. Visit for preventive health examination - The patient was counseled regarding the appropriate use of alcohol, regular self-examination of the breasts on a monthly basis, prevention of dental and periodontal disease, diet, regular sustained exercise for at least 30 minutes 5 times per week, routine screening interval for mammogram as recommended by the [place] and ACOG, importance of regular PAP smears,and recommended schedule for GI hemoccult testing, colonoscopy, cholesterol, thyroid and diabetes screening. -pt needs to follow up with dentist due to poor dentition, educated on the importance of this and the adverse effects of not having proper oral care.  -to increase physical activity slowly and as tolerated -decrease in hearing noted, recommended hearing evaluation   2. Uncontrolled type 2 diabetes mellitus with stage 4 chronic kidney disease, with long-term current use of insulin (HCC) Lab Results  Component Value Date   HGBA1C 7.8* 02/08/2016  Following with Oretha Ellis for diabetic management, blood sugars staying from 110-150  fasting per pt. Discussed diet modifications and to keep follow up with [facility] next month -cont current medications   3. CKD (chronic kidney disease) stage 4, GFR 15-29 ml/min (HCC) -Stable, avoid nephrotoxic medication and to stay hydration  4. Chronic diastolic congestive heart failure (HCC) -evolumeic, conts lasix and lisinopril -has follow up scheduled with CHF clinic  5. Hyperlipidemia  -discussed lipid panel, LDL at goal, conts on crestor   6. Hypertension  Stable on current regimen. Cont lisinopril, lasix   To follow up in 6 months, sooner if needed Jessica K. Harle Battiest  [facility] [phone] 8 am - 5 pm) [phone] (after hours)

## 2016-02-11 NOTE — Patient Instructions (Signed)
To keep follow up with [facility]  Increase activity slowly-goal is 30 mins 5 days a week of cardiovascular activity Cont heart healthy diet but also watch sugar intake  MAKE DENTAL APPOINTMENT   Follow up in 6 months for routine follow up

## 2016-02-15 ENCOUNTER — Ambulatory Visit: Payer: Medicare Other | Admitting: Pharmacotherapy

## 2016-02-20 ENCOUNTER — Other Ambulatory Visit: Payer: Self-pay | Admitting: Nurse Practitioner

## 2016-02-22 ENCOUNTER — Ambulatory Visit (INDEPENDENT_AMBULATORY_CARE_PROVIDER_SITE_OTHER): Payer: Medicare Other | Admitting: Pharmacotherapy

## 2016-02-22 ENCOUNTER — Encounter: Payer: Self-pay | Admitting: Pharmacotherapy

## 2016-02-22 VITALS — BP 122/76 | HR 97 | Temp 97.7°F | Resp 22 | Ht 68.0 in | Wt 242.0 lb

## 2016-02-22 DIAGNOSIS — E1165 Type 2 diabetes mellitus with hyperglycemia: Secondary | ICD-10-CM | POA: Diagnosis not present

## 2016-02-22 DIAGNOSIS — C4442 Squamous cell carcinoma of skin of scalp and neck: Secondary | ICD-10-CM | POA: Diagnosis not present

## 2016-02-22 DIAGNOSIS — N184 Chronic kidney disease, stage 4 (severe): Secondary | ICD-10-CM | POA: Diagnosis not present

## 2016-02-22 DIAGNOSIS — I10 Essential (primary) hypertension: Secondary | ICD-10-CM | POA: Diagnosis not present

## 2016-02-22 DIAGNOSIS — C44712 Basal cell carcinoma of skin of right lower limb, including hip: Secondary | ICD-10-CM | POA: Diagnosis not present

## 2016-02-22 DIAGNOSIS — D0462 Carcinoma in situ of skin of left upper limb, including shoulder: Secondary | ICD-10-CM | POA: Diagnosis not present

## 2016-02-22 DIAGNOSIS — Z794 Long term (current) use of insulin: Secondary | ICD-10-CM

## 2016-02-22 DIAGNOSIS — IMO0002 Reserved for concepts with insufficient information to code with codable children: Secondary | ICD-10-CM

## 2016-02-22 DIAGNOSIS — L821 Other seborrheic keratosis: Secondary | ICD-10-CM | POA: Diagnosis not present

## 2016-02-22 DIAGNOSIS — E1122 Type 2 diabetes mellitus with diabetic chronic kidney disease: Secondary | ICD-10-CM | POA: Diagnosis not present

## 2016-02-22 NOTE — Patient Instructions (Signed)
Continue Levemir 24 units daily Continue Tradjenta 5mg  daily

## 2016-02-22 NOTE — Progress Notes (Signed)
  Subjective:    Melissa Shannon is a 82 y.o.white female who presents for follow-up of Type 2 diabetes mellitus.   A1C is 7.8% (was 7.2%) Total cholesterol:  164 HDL: 65 LDL: 64 Triglycerides: 175 (was 208) EGFR: 13m/min  Has significant LE edema. BG logbook:  118-186 No hypoglycemia.  On Friday increased her insulin to 24 units daily. She tries to eat healthy choices, but binges on fruit.  Has been told to avoid high potassium fruit (banana, oranges) She does leg exercises while in a chair.  Says she is trying to walk more. Denies problems with feet. Wears glasses.  Denies problems with vision.  Saw Dr SGershon Cranein January 2017. Nocturia each night.  Has some urinary incontinence. Staying well hydrated.   Review of Systems A comprehensive review of systems was negative except for: Eyes: positive for contacts/glasses Cardiovascular: positive for lower extremity edema Genitourinary: positive for nocturia and urinary incontinence    Objective:    BP 122/76 mmHg  Pulse 97  Temp(Src) 97.7 F (36.5 C) (Oral)  Resp 22  Ht _0  (1.727 m)  Wt 242 lb (109.77 kg)  BMI 36.80 kg/m2  SpO2 93%  General:  alert, cooperative and no distress  Oropharynx: normal findings: lips normal without lesions and gums healthy   Eyes:  negative findings: lids and lashes normal and conjunctivae and sclerae normal   Ears:  external ears normal        Lung: clear to auscultation bilaterally  Heart:  regular rate and rhythm     Extremities: edema significant bilateral lower extremities  Skin: warm and dry, no hyperpigmentation, vitiligo, or suspicious lesions     Neuro: mental status, speech normal, alert and oriented x3   Lab Review GLUCOSE (mg/dL)  Date Value  02/08/2016 126*  01/18/2016 180*  11/06/2015 131*   GLUCOSE, BLD (mg/dL)  Date Value  05/18/2015 203*  11/29/2014 129*  11/28/2014 119*   CO2 (mmol/L)  Date Value  02/08/2016 23  01/18/2016 25  11/06/2015 23   BUN  (mg/dL)  Date Value  02/08/2016 29*  01/18/2016 33*  11/06/2015 38*  05/18/2015 42*  11/29/2014 27*  11/28/2014 28*   CREATININE, SER (mg/dL)  Date Value  02/08/2016 1.25*  01/18/2016 1.36*  11/06/2015 1.34*       Assessment:    Diabetes Mellitus type II, under good control.  A1C at goal <8% BP at goal <140/90   Plan:    1.  Rx changes: Levemir increased to 24 units daily. 2.  Continue Tradjenta 532mdaily. 3.  Medication choices are limited due to renal dysfunction. 4.  Counseled on nutrition goals. 5.  Counseled on need for routine exercise. 6.  BP at goal <140/90 7.  LDL and HDL at goal.  TG a little high.

## 2016-02-24 LAB — CBC WITH DIFFERENTIAL/PLATELET
BASOS ABS: 0 10*3/uL (ref 0.0–0.2)
Basos: 0 %
EOS (ABSOLUTE): 0.1 10*3/uL (ref 0.0–0.4)
Eos: 1 %
HEMATOCRIT: 42 % (ref 34.0–46.6)
HEMOGLOBIN: 13.2 g/dL (ref 11.1–15.9)
Immature Grans (Abs): 0 10*3/uL (ref 0.0–0.1)
Immature Granulocytes: 0 %
LYMPHS ABS: 2 10*3/uL (ref 0.7–3.1)
Lymphs: 22 %
MCH: 28.2 pg (ref 26.6–33.0)
MCHC: 31.4 g/dL — AB (ref 31.5–35.7)
MCV: 90 fL (ref 79–97)
MONOS ABS: 0.8 10*3/uL (ref 0.1–0.9)
Monocytes: 8 %
NEUTROS ABS: 6.1 10*3/uL (ref 1.4–7.0)
Neutrophils: 69 %
Platelets: 200 10*3/uL (ref 150–379)
RBC: 4.68 x10E6/uL (ref 3.77–5.28)
RDW: 13.7 % (ref 12.3–15.4)
WBC: 9 10*3/uL (ref 3.4–10.8)

## 2016-02-24 LAB — HEMOGLOBIN A1C
ESTIMATED AVERAGE GLUCOSE: 160 mg/dL
HEMOGLOBIN A1C: 7.2 % — AB (ref 4.8–5.6)

## 2016-02-24 LAB — COMPREHENSIVE METABOLIC PANEL
ALBUMIN: 4 g/dL (ref 3.5–4.7)
ALK PHOS: 76 IU/L (ref 39–117)
ALT: 8 IU/L (ref 0–32)
AST: 11 IU/L (ref 0–40)
Albumin/Globulin Ratio: 1.4 (ref 1.2–2.2)
BUN / CREAT RATIO: 24 (ref 12–28)
BUN: 33 mg/dL — AB (ref 8–27)
Bilirubin Total: 0.4 mg/dL (ref 0.0–1.2)
CO2: 25 mmol/L (ref 18–29)
CREATININE: 1.36 mg/dL — AB (ref 0.57–1.00)
Calcium: 10.2 mg/dL (ref 8.7–10.3)
Chloride: 98 mmol/L (ref 96–106)
GFR calc non Af Amer: 35 mL/min/{1.73_m2} — ABNORMAL LOW (ref >59)
GFR, EST AFRICAN AMERICAN: 41 mL/min/{1.73_m2} — AB (ref >59)
GLUCOSE: 180 mg/dL — AB (ref 65–99)
Globulin, Total: 2.8 g/dL (ref 1.5–4.5)
Potassium: 4.5 mmol/L (ref 3.5–5.2)
Sodium: 142 mmol/L (ref 134–144)
TOTAL PROTEIN: 6.8 g/dL (ref 6.0–8.5)

## 2016-02-24 LAB — TSH: TSH: 1.28 u[IU]/mL (ref 0.450–4.500)

## 2016-03-03 ENCOUNTER — Encounter (HOSPITAL_COMMUNITY): Payer: Self-pay

## 2016-03-03 ENCOUNTER — Ambulatory Visit (HOSPITAL_COMMUNITY)
Admission: RE | Admit: 2016-03-03 | Discharge: 2016-03-03 | Disposition: A | Discharge status: Home / Self Care | Payer: Medicare Other | Source: Ambulatory Visit | Attending: Cardiology | Admitting: Cardiology

## 2016-03-03 VITALS — BP 126/74 | HR 97 | Wt 242.5 lb

## 2016-03-03 DIAGNOSIS — I5032 Chronic diastolic (congestive) heart failure: Secondary | ICD-10-CM

## 2016-03-03 MED ORDER — FUROSEMIDE 20 MG PO TABS: ORAL_TABLET | ORAL | Status: DC

## 2016-03-03 NOTE — Patient Instructions (Signed)
Change Furosemide (Lasix) to 20 mg every other day ALTERNATING with 40 mg (2 tabs) every other day   Labs in 10 days  We will contact you in 4 months to schedule your next appointment.

## 2016-03-05 ENCOUNTER — Other Ambulatory Visit: Payer: Self-pay | Admitting: Nurse Practitioner

## 2016-03-05 DIAGNOSIS — I5032 Chronic diastolic (congestive) heart failure: Secondary | ICD-10-CM | POA: Insufficient documentation

## 2016-03-05 NOTE — Progress Notes (Signed)
Patient ID: Melissa Shannon, female   DOB: [birth year], 82 y.o.   MRN: [id number] PCP: Dr. Bubba Camp  82 yo with history of HTN, DM, and immobility due to osteoarthritis presents for followup of right heart failure and PE.  At baseline, patient is not very active.  She has knee arthritis that is very limiting.  She does not do much walking and uses a walker to get around.  She has been very limited for a few years now.  She was admitted in 10/15 with dyspnea.  The dyspnea began about 2-3 days prior to admission.  She would get short of breath after walking about 20 feet with her walker.  She was noted to be in CHF at admission with troponin elevated to 0.6.  Echo showed normal LV size and systolic function but severely dilated/dysfunctional RV with pulmonary hypertension.  V/Q scan was done and showed intermediate probability for PE.  She was seen by pulmonary and thought to have a PE.  Apixaban was started.  She was diuresed with IV Lasix.  Repeat echo was done 3/16.  This showed EF 55-60%, normal RV size and systolic function, and PA systolic pressure 28 mmHg.  V/Q scan in 5/16 was normal.   She was admitted in 4/16 with altered mental status, likely due to E coli urosepsis.   She returns for followup. She has been more short of breath the last couple of days than her baseline.  Weight is up 11 lbs compared to last fall but she is very inactive and says that her appetite is "very good."  She is limited by her orthopedic problems.  Short of breath walking about 100 feet.  She uses a cane or walker.  No falls.   Labs (06/18/14): K 4.4, creatinine 1.44, HCT 35.9, BNP 10167 => 1415 Labs (09/01/2014): K 4.2 Creatinine 1.23  Labs (2/16): K 4.3, creatinine 1.62, HCT 40.7 Labs (7/16): K 5.2, creatinine 1.66 Labs (6/17): creatinine 1.25, LDL 64  ECG: NSR, poor RWP  PMH: 1. HTN 2. Hyperlipidemia 3. Osteoarthritis: Involving knees, uses walker.  4. Type II diabetes 5. H/o CCY 6. CKD stage III 7.  Hypothyroidism 8. PE: Admitted 10/15 with dyspnea/CHF.  Echo (10/15) with EF 60-65%, D-shaped interventricular septum, severely dilated RV with moderately decreased RV systolic function, PA systolic pressure 47 mmHg.  V/Q scan (10/15) was intermediate probability for PE. Patient was started on apixaban.  Echo (3/16) with EF 55-60%, basal inferior hypokinesis, normal RV size and systolic function, PA systolic pressure 28 mmHg. V/Q scan 5/16 was normal.  9. Nodules on CT chest 10/15: Per Dr Melvyn Novas, likely benign findings.   FH: No h/o venous thromboembolism, no heart problems that she knows of.   SH: Widow, lives alone, nonsmoker, 3 kids.   ROS: All systems reviewed and negative except as per HPI.   Current Outpatient Prescriptions  Medication Sig Dispense Refill  . apixaban (ELIQUIS) 5 MG TABS tablet Take 0.5 tablets (2.5 mg total) by mouth 2 (two) times daily. 60 tablet 3  . BD PEN NEEDLE NANO U/F 32G X 4 MM MISC daily. use as directed  6  . Cholecalciferol (VITAMIN D3) 5000 UNITS CHEW Chew 1 tablet by mouth daily.    . feeding supplement, ENSURE COMPLETE, (ENSURE COMPLETE) LIQD Take 237 mLs by mouth 2 (two) times daily between meals as needed (As requested by patient). 30 Bottle 0  . furosemide (LASIX) 20 MG tablet Take 40 mg every other day ALTERNATING with 20 mg  every other day 45 tablet 3  . hydrALAZINE (APRESOLINE) 10 MG tablet TAKE 1 TABLET BY MOUTH 3 TIMES DAILY 90 tablet 1  . Insulin Detemir (LEVEMIR FLEXPEN) 100 UNIT/ML Pen Inject 20 units into the skin once daily to control blood sugar 15 mL 11  . levothyroxine (SYNTHROID, LEVOTHROID) 100 MCG tablet TAKE 1 TABLET (100 MCG TOTAL) BY MOUTH DAILY BEFORE BREAKFAST 90 tablet 1  . lisinopril (PRINIVIL,ZESTRIL) 5 MG tablet Take 5 mg by mouth 2 (two) times daily.    . Multiple Vitamin (MULTIVITAMIN WITH MINERALS) TABS tablet Take 1 tablet by mouth daily. 30 tablet 0  . ONE TOUCH ULTRA TEST test strip Use every other day to test blood sugar 100  each 3  . ONETOUCH DELICA LANCETS 99991111 MISC USE EVERY OTHER DAY TO TEST BLOOD SUGAR  3  . polyethylene glycol (MIRALAX / GLYCOLAX) packet Take 17 g by mouth as needed for mild constipation.     . rosuvastatin (CRESTOR) 20 MG tablet Take one tablet by mouth once daily for cholesterol 30 tablet 3  . TRADJENTA 5 MG TABS tablet TAKE 1 TABLET BY MOUTH EVERY DAY 30 tablet 5   No current facility-administered medications for this encounter.   BP 126/74 mmHg  Pulse 97  Wt 242 lb 8 oz (109.997 kg)  SpO2 97% General: NAD, obese Neck: JVP 8 cm, no thyromegaly or thyroid nodule.  Lungs: Clear to auscultation bilaterally with normal respiratory effort. CV: Nondisplaced PMI.  Heart regular S1/S2, no S3/S4, no murmur.  Thick lower legs but no pitting edema.  No carotid bruit.  Normal pedal pulses.  Abdomen: Soft, nontender, no hepatosplenomegaly, no distention.  Skin: Intact without lesions or rashes.  Neurologic: Alert and oriented x 3.  Psych: Normal affect. Extremities: No clubbing or cyanosis.  HEENT: Normal.   Assessment/Plan 1. Right heart failure: Echo on 10/15 showed dilated RV with decreased systolic function.  V/Q scan with suspected PE.  3/16 echo showed normal RV size and systolic function with normal estimate PA systolic pressure.  5/16 V/Q scan showed no PE.  More dyspneic recently, not markedly volume overloaded.  Probably deconditioning and weight gain from calories rather than fluid plays the major role here, but may have a mild amount of extra fluid present. - Increase Lasix incrementally to 40 daily alternating with 20 daily (currently taking 20 mg daily).  BMET in 10 days.    2. PE: V/Q scan intermediate probability for PE in 10/15.  Pulmonary saw and suspect that she did indeed have PE.  The PE was spontaneous.  She is at risk given general immobility. No bleeding problems.  She did not have evidence for chronic PE on 5/16 V/Q scan.  Continue apixaban at 2.5 mg bid long-term to  decrease long-term risk as she will likely remain fairly immobile long-term due to her arthritis. CBC today.  I do not think that she has had a recurrent PE, symptoms are quite mild.  3. CKD: BMET today.   Followup in 4 months.    Loralie Champagne 03/05/2016

## 2016-03-14 ENCOUNTER — Ambulatory Visit (HOSPITAL_COMMUNITY)
Admission: RE | Admit: 2016-03-14 | Discharge: 2016-03-14 | Disposition: A | Discharge status: Home / Self Care | Payer: Medicare Other | Source: Ambulatory Visit | Attending: Cardiology | Admitting: Cardiology

## 2016-03-14 DIAGNOSIS — I5032 Chronic diastolic (congestive) heart failure: Secondary | ICD-10-CM

## 2016-03-14 LAB — CBC
HEMATOCRIT: 43.5 % (ref 36.0–46.0)
Hemoglobin: 13.6 g/dL (ref 12.0–15.0)
MCH: 28.9 pg (ref 26.0–34.0)
MCHC: 31.3 g/dL (ref 30.0–36.0)
MCV: 92.4 fL (ref 78.0–100.0)
PLATELETS: 179 10*3/uL (ref 150–400)
RBC: 4.71 MIL/uL (ref 3.87–5.11)
RDW: 13.7 % (ref 11.5–15.5)
WBC: 8.9 10*3/uL (ref 4.0–10.5)

## 2016-03-14 LAB — BASIC METABOLIC PANEL
Anion gap: 9 (ref 5–15)
BUN: 23 mg/dL — AB (ref 6–20)
CO2: 26 mmol/L (ref 22–32)
Calcium: 10 mg/dL (ref 8.9–10.3)
Chloride: 106 mmol/L (ref 101–111)
Creatinine, Ser: 1.31 mg/dL — ABNORMAL HIGH (ref 0.44–1.00)
GFR calc Af Amer: 41 mL/min — ABNORMAL LOW (ref >60)
GFR, EST NON AFRICAN AMERICAN: 36 mL/min — AB (ref >60)
Glucose, Bld: 159 mg/dL — ABNORMAL HIGH (ref 65–99)
POTASSIUM: 4 mmol/L (ref 3.5–5.1)
SODIUM: 141 mmol/L (ref 135–145)

## 2016-03-14 LAB — BRAIN NATRIURETIC PEPTIDE: B Natriuretic Peptide: 25.3 pg/mL (ref 0.0–100.0)

## 2016-04-02 ENCOUNTER — Other Ambulatory Visit: Payer: Self-pay | Admitting: Internal Medicine

## 2016-04-25 DIAGNOSIS — Z85828 Personal history of other malignant neoplasm of skin: Secondary | ICD-10-CM | POA: Diagnosis not present

## 2016-04-25 DIAGNOSIS — D485 Neoplasm of uncertain behavior of skin: Secondary | ICD-10-CM | POA: Diagnosis not present

## 2016-04-25 DIAGNOSIS — D0471 Carcinoma in situ of skin of right lower limb, including hip: Secondary | ICD-10-CM | POA: Diagnosis not present

## 2016-04-25 DIAGNOSIS — L720 Epidermal cyst: Secondary | ICD-10-CM | POA: Diagnosis not present

## 2016-04-25 DIAGNOSIS — C44712 Basal cell carcinoma of skin of right lower limb, including hip: Secondary | ICD-10-CM | POA: Diagnosis not present

## 2016-04-25 DIAGNOSIS — C44719 Basal cell carcinoma of skin of left lower limb, including hip: Secondary | ICD-10-CM | POA: Diagnosis not present

## 2016-05-08 ENCOUNTER — Other Ambulatory Visit: Payer: Self-pay | Admitting: Nurse Practitioner

## 2016-05-30 ENCOUNTER — Ambulatory Visit (INDEPENDENT_AMBULATORY_CARE_PROVIDER_SITE_OTHER): Payer: Medicare Other | Admitting: Pharmacotherapy

## 2016-05-30 ENCOUNTER — Other Ambulatory Visit: Payer: Self-pay

## 2016-05-30 ENCOUNTER — Encounter: Payer: Self-pay | Admitting: Pharmacotherapy

## 2016-05-30 VITALS — BP 110/70 | HR 111 | Temp 98.5°F | Resp 22 | Ht 68.0 in | Wt 243.6 lb

## 2016-05-30 DIAGNOSIS — I10 Essential (primary) hypertension: Secondary | ICD-10-CM | POA: Diagnosis not present

## 2016-05-30 DIAGNOSIS — E1165 Type 2 diabetes mellitus with hyperglycemia: Secondary | ICD-10-CM | POA: Diagnosis not present

## 2016-05-30 DIAGNOSIS — IMO0002 Reserved for concepts with insufficient information to code with codable children: Secondary | ICD-10-CM

## 2016-05-30 DIAGNOSIS — E1122 Type 2 diabetes mellitus with diabetic chronic kidney disease: Secondary | ICD-10-CM | POA: Diagnosis not present

## 2016-05-30 DIAGNOSIS — Z794 Long term (current) use of insulin: Secondary | ICD-10-CM

## 2016-05-30 DIAGNOSIS — N184 Chronic kidney disease, stage 4 (severe): Secondary | ICD-10-CM

## 2016-05-30 NOTE — Progress Notes (Signed)
  Subjective:    Melissa Shannon is a 82 y.o.white female who presents for follow-up of Type 2 diabetes mellitus.   Last A1C was 7.8% (02/08/16)  BG: 125-169 No hypoglycemia  Has peripheral edema - severe Staying well hydrated. Trying to eat healthy, avoid salt Not much exercise. Uses a walker to help with ambulation Denies vision problems.  Wears glasses. Denies problems with feet. Nocturia "depends" - at least once per night.    Review of Systems A comprehensive review of systems was negative except for: Eyes: positive for contacts/glasses Cardiovascular: positive for lower extremity edema Genitourinary: positive for nocturia    Objective:    BP 110/70   Pulse (!) 111   Temp 98.5 F (36.9 C) (Oral)   Resp (!) 22   Ht 5' 8"$  (1.727 m)   Wt 243 lb 9.6 oz (110.5 kg)   SpO2 93%   BMI 37.04 kg/m   General:  alert, cooperative and no distress  Oropharynx: normal findings: lips normal without lesions and gums healthy   Eyes:  negative findings: lids and lashes normal and conjunctivae and sclerae normal   Ears:  external ears normal        Lung: clear to auscultation bilaterally  Heart:  regular rate and rhythm     Extremities: edema 3+ in LE  Skin: dry     Neuro: mental status, speech normal, alert and oriented x3   Lab Review Glucose (mg/dL)  Date Value  02/08/2016 126 (H)  01/18/2016 180 (H)  11/06/2015 131 (H)   Glucose, Bld (mg/dL)  Date Value  03/14/2016 159 (H)  05/18/2015 203 (H)  11/29/2014 129 (H)   CO2 (mmol/L)  Date Value  03/14/2016 26  02/08/2016 23  01/18/2016 25   BUN (mg/dL)  Date Value  03/14/2016 23 (H)  02/08/2016 29 (H)  01/18/2016 33 (H)  11/06/2015 38 (H)   Creatinine, Ser (mg/dL)  Date Value  03/14/2016 1.31 (H)  02/08/2016 1.25 (H)  01/18/2016 1.36 (H)      Labs today:  A1C, CMP   Assessment:    Diabetes Mellitus type II, under good control. SMBG records indicate good control. BP at goal <140/90 CKD - stable    Plan:    1.  Rx changes: none  2.  Will check labs today before making any RX changes. 3.  Continue Levemir 20 units daily and Tradjenta 102m daily. 4.  Counseled on need for routine exercise. 5.  Counseled on nutrition goals. 6.  BP at goal <140/90. 7.  Will recheck kidney function today.

## 2016-05-30 NOTE — Patient Instructions (Signed)
Will check labs today prior to changing medication.  Try to start exercise - 30-45 minutes 5 x week

## 2016-05-31 LAB — COMPLETE METABOLIC PANEL WITH GFR
ALT: 10 U/L (ref 6–29)
AST: 12 U/L (ref 10–35)
Albumin: 3.8 g/dL (ref 3.6–5.1)
Alkaline Phosphatase: 70 U/L (ref 33–130)
BUN: 29 mg/dL — ABNORMAL HIGH (ref 7–25)
CO2: 25 mmol/L (ref 20–31)
Calcium: 10.6 mg/dL — ABNORMAL HIGH (ref 8.6–10.4)
Chloride: 101 mmol/L (ref 98–110)
Creat: 1.44 mg/dL — ABNORMAL HIGH (ref 0.60–0.88)
GFR, Est African American: 38 mL/min — ABNORMAL LOW (ref >=60)
GFR, Est Non African American: 33 mL/min — ABNORMAL LOW (ref >=60)
Glucose, Bld: 168 mg/dL — ABNORMAL HIGH (ref 65–99)
Potassium: 4.9 mmol/L (ref 3.5–5.3)
Sodium: 141 mmol/L (ref 135–146)
Total Bilirubin: 0.5 mg/dL (ref 0.2–1.2)
Total Protein: 6.6 g/dL (ref 6.1–8.1)

## 2016-06-02 ENCOUNTER — Other Ambulatory Visit: Payer: Self-pay | Admitting: Internal Medicine

## 2016-06-05 ENCOUNTER — Other Ambulatory Visit: Payer: Self-pay | Admitting: Nurse Practitioner

## 2016-06-05 ENCOUNTER — Other Ambulatory Visit: Payer: Self-pay | Admitting: Internal Medicine

## 2016-06-27 ENCOUNTER — Encounter (HOSPITAL_COMMUNITY): Payer: Self-pay

## 2016-06-27 ENCOUNTER — Ambulatory Visit (HOSPITAL_COMMUNITY)
Admission: RE | Admit: 2016-06-27 | Discharge: 2016-06-27 | Disposition: A | Discharge status: Home / Self Care | Payer: Medicare Other | Source: Ambulatory Visit | Attending: Cardiology | Admitting: Cardiology

## 2016-06-27 VITALS — BP 118/62 | HR 112 | Wt 235.0 lb

## 2016-06-27 DIAGNOSIS — M170 Bilateral primary osteoarthritis of knee: Secondary | ICD-10-CM | POA: Diagnosis not present

## 2016-06-27 DIAGNOSIS — Z7901 Long term (current) use of anticoagulants: Secondary | ICD-10-CM | POA: Diagnosis not present

## 2016-06-27 DIAGNOSIS — I50810 Right heart failure, unspecified: Secondary | ICD-10-CM | POA: Insufficient documentation

## 2016-06-27 DIAGNOSIS — I2729 Other secondary pulmonary hypertension: Secondary | ICD-10-CM | POA: Diagnosis not present

## 2016-06-27 DIAGNOSIS — Z794 Long term (current) use of insulin: Secondary | ICD-10-CM | POA: Insufficient documentation

## 2016-06-27 DIAGNOSIS — N183 Chronic kidney disease, stage 3 (moderate): Secondary | ICD-10-CM | POA: Insufficient documentation

## 2016-06-27 DIAGNOSIS — E1122 Type 2 diabetes mellitus with diabetic chronic kidney disease: Secondary | ICD-10-CM | POA: Insufficient documentation

## 2016-06-27 DIAGNOSIS — I5032 Chronic diastolic (congestive) heart failure: Secondary | ICD-10-CM

## 2016-06-27 DIAGNOSIS — I130 Hypertensive heart and chronic kidney disease with heart failure and stage 1 through stage 4 chronic kidney disease, or unspecified chronic kidney disease: Secondary | ICD-10-CM | POA: Insufficient documentation

## 2016-06-27 LAB — CBC
HEMATOCRIT: 41 % (ref 36.0–46.0)
Hemoglobin: 13 g/dL (ref 12.0–15.0)
MCH: 29 pg (ref 26.0–34.0)
MCHC: 31.7 g/dL (ref 30.0–36.0)
MCV: 91.3 fL (ref 78.0–100.0)
PLATELETS: 185 10*3/uL (ref 150–400)
RBC: 4.49 MIL/uL (ref 3.87–5.11)
RDW: 14 % (ref 11.5–15.5)
WBC: 9.5 10*3/uL (ref 4.0–10.5)

## 2016-06-27 MED ORDER — FUROSEMIDE 20 MG PO TABS: 10.0000 mg | ORAL_TABLET | Freq: Two times a day (BID) | ORAL | 3 refills | Status: DC

## 2016-06-27 MED ORDER — DOCUSATE SODIUM 100 MG PO CAPS: 100.0000 mg | ORAL_CAPSULE | Freq: Two times a day (BID) | ORAL | 0 refills | Status: DC

## 2016-06-27 MED ORDER — LISINOPRIL 5 MG PO TABS: 5.0000 mg | ORAL_TABLET | Freq: Two times a day (BID) | ORAL | 3 refills | Status: DC

## 2016-06-27 NOTE — Patient Instructions (Addendum)
Labs today (will call for abnormal results, otherwise no news is good news)  Take Lisinopril 5 mg (1 Tab) Twice a Day  Stop Hydralazine  Take Furosamide 10 mg (1/2 Tab) Twice a day  Take Colace 100 mg by mouth Twice a Day for stool softner, this is over the counter at your pharmacy.

## 2016-06-28 NOTE — Progress Notes (Signed)
Patient ID: Melissa Shannon, female   DOB: Jun 21, 1928, 82 y.o.   MRN: [id number] PCP: Dr. Bubba Camp  82 yo with history of HTN, DM, and immobility due to osteoarthritis presents for followup of right heart failure and PE.  At baseline, patient is not very active.  She has knee arthritis that is very limiting.  She does not do much walking and uses a walker to get around.  She has been very limited for a few years now.  She was admitted in 10/15 with dyspnea.  The dyspnea began about 2-3 days prior to admission.  She would get short of breath after walking about 20 feet with her walker.  She was noted to be in CHF at admission with troponin elevated to 0.6.  Echo showed normal LV size and systolic function but severely dilated/dysfunctional RV with pulmonary hypertension.  V/Q scan was done and showed intermediate probability for PE.  She was seen by pulmonary and thought to have a PE.  Apixaban was started.  She was diuresed with IV Lasix.  Repeat echo was done 3/16.  This showed EF 55-60%, normal RV size and systolic function, and PA systolic pressure 28 mmHg.  V/Q scan in 5/16 was normal.   She was admitted in 4/16 with altered mental status, likely due to E coli urosepsis.   She returns for followup. Weight is down about 7 lbs. She is limited by her orthopedic problems.  Short of breath walking about 100 feet.  She uses a cane or walker.  No falls.    ECG: NSR, normal.   Labs (06/18/14): K 4.4, creatinine 1.44, HCT 35.9, BNP 10167 => 1415 Labs (09/01/2014): K 4.2 Creatinine 1.23  Labs (2/16): K 4.3, creatinine 1.62, HCT 40.7 Labs (7/16): K 5.2, creatinine 1.66 Labs (6/17): creatinine 1.25, LDL 64 Labs (7/17): HCT 43.5 Labs (10/17): K 4.9, creatinine 1.44, BNP 25  PMH: 1. HTN 2. Hyperlipidemia 3. Osteoarthritis: Involving knees, uses walker.  4. Type II diabetes 5. H/o CCY 6. CKD stage III 7. Hypothyroidism 8. PE: Admitted 10/15 with dyspnea/CHF.  Echo (10/15) with EF 60-65%, D-shaped  interventricular septum, severely dilated RV with moderately decreased RV systolic function, PA systolic pressure 47 mmHg.  V/Q scan (10/15) was intermediate probability for PE. Patient was started on apixaban.  Echo (3/16) with EF 55-60%, basal inferior hypokinesis, normal RV size and systolic function, PA systolic pressure 28 mmHg. V/Q scan 5/16 was normal.  9. Nodules on CT chest 10/15: Per Dr Melvyn Novas, likely benign findings.   FH: No h/o venous thromboembolism, no heart problems that she knows of.   SH: Widow, lives alone, nonsmoker, 3 kids.   ROS: All systems reviewed and negative except as per HPI.   Current Outpatient Prescriptions  Medication Sig Dispense Refill  . apixaban (ELIQUIS) 5 MG TABS tablet Take 0.5 tablets (2.5 mg total) by mouth 2 (two) times daily. 60 tablet 3  . BD PEN NEEDLE NANO U/F 32G X 4 MM MISC daily. use as directed  6  . Cholecalciferol (VITAMIN D3) 5000 UNITS CHEW Chew 1 tablet by mouth daily.    . feeding supplement, ENSURE COMPLETE, (ENSURE COMPLETE) LIQD Take 237 mLs by mouth 2 (two) times daily between meals as needed (As requested by patient). 30 Bottle 0  . furosemide (LASIX) 20 MG tablet Take 0.5 tablets (10 mg total) by mouth 2 (two) times daily. 45 tablet 3  . Insulin Detemir (LEVEMIR FLEXPEN) 100 UNIT/ML Pen Inject 20 units into the skin  once daily to control blood sugar 15 mL 11  . levothyroxine (SYNTHROID, LEVOTHROID) 100 MCG tablet TAKE 1 TABLET (100 MCG TOTAL) BY MOUTH DAILY BEFORE BREAKFAST 90 tablet 1  . lisinopril (PRINIVIL,ZESTRIL) 5 MG tablet Take 1 tablet (5 mg total) by mouth 2 (two) times daily. Take 1 Tab Twice Daily 60 tablet 3  . Multiple Vitamin (MULTIVITAMIN WITH MINERALS) TABS tablet Take 1 tablet by mouth daily. 30 tablet 0  . ONE TOUCH ULTRA TEST test strip Use every other day to test blood sugar 100 each 3  . ONETOUCH DELICA LANCETS 16X MISC USE EVERY OTHER DAY TO TEST BLOOD SUGAR  3  . polyethylene glycol (MIRALAX / GLYCOLAX) packet  Take 17 g by mouth as needed for mild constipation.     . rosuvastatin (CRESTOR) 20 MG tablet Take one tablet by mouth once daily for cholesterol 30 tablet 3  . TRADJENTA 5 MG TABS tablet TAKE 1 TABLET BY MOUTH EVERY DAY 30 tablet 5  . docusate sodium (COLACE) 100 MG capsule Take 1 capsule (100 mg total) by mouth 2 (two) times daily. 10 capsule 0   No current facility-administered medications for this encounter.    BP 118/62   Pulse (!) 112   Wt 235 lb (106.6 kg)   SpO2 95%   BMI 35.73 kg/m  General: NAD, obese Neck: JVP 7 cm, no thyromegaly or thyroid nodule.  Lungs: Clear to auscultation bilaterally with normal respiratory effort. CV: Nondisplaced PMI.  Heart regular S1/S2, no S3/S4, no murmur.  Thick lower legs but no pitting edema (lymphedema).  No carotid bruit.  Normal pedal pulses.  Abdomen: Soft, nontender, no hepatosplenomegaly, no distention.  Skin: Intact without lesions or rashes.  Neurologic: Alert and oriented x 3.  Psych: Normal affect. Extremities: No clubbing or cyanosis.  HEENT: Normal.   Assessment/Plan 1. Right heart failure: Echo on 10/15 showed dilated RV with decreased systolic function.  V/Q scan with suspected PE.  3/16 echo showed normal RV size and systolic function with normal estimate PA systolic pressure.  5/16 V/Q scan showed no PE.  Stable exertional dyspnea.  She does not appear volume overloaded on exam, dyspnea may be due more to deconditioning. - Continue current Lasix regimen, she is taking 10 mg bid.    2. PE: V/Q scan intermediate probability for PE in 10/15.  Pulmonary saw and suspect that she did indeed have PE.  The PE was spontaneous.  She is at risk given general immobility. No bleeding problems.  She did not have evidence for chronic PE on 5/16 V/Q scan.  Continue apixaban at 2.5 mg bid long-term to decrease long-term risk as she will likely remain fairly immobile long-term due to her arthritis. CBC today.   3. HTN: Continue lisinopril 5 mg  bid, but I think she can stop hydralazine.    Followup in 6 months.    Loralie Champagne 06/28/2016

## 2016-07-11 ENCOUNTER — Other Ambulatory Visit: Payer: Self-pay | Admitting: Nurse Practitioner

## 2016-07-18 ENCOUNTER — Other Ambulatory Visit: Payer: Self-pay | Admitting: Nurse Practitioner

## 2016-07-25 DIAGNOSIS — L853 Xerosis cutis: Secondary | ICD-10-CM | POA: Diagnosis not present

## 2016-07-25 DIAGNOSIS — L821 Other seborrheic keratosis: Secondary | ICD-10-CM | POA: Diagnosis not present

## 2016-07-25 DIAGNOSIS — L570 Actinic keratosis: Secondary | ICD-10-CM | POA: Diagnosis not present

## 2016-07-25 DIAGNOSIS — Z85828 Personal history of other malignant neoplasm of skin: Secondary | ICD-10-CM | POA: Diagnosis not present

## 2016-07-27 ENCOUNTER — Telehealth: Payer: Self-pay | Admitting: Nurse Practitioner

## 2016-07-27 NOTE — Telephone Encounter (Signed)
left msg asking pt to confirm this AWV appt w/ nurse and EV w/ Janett Billow. VDM (DD)

## 2016-08-18 ENCOUNTER — Encounter: Payer: Self-pay | Admitting: Nurse Practitioner

## 2016-08-18 ENCOUNTER — Ambulatory Visit (INDEPENDENT_AMBULATORY_CARE_PROVIDER_SITE_OTHER): Payer: Medicare Other | Admitting: Nurse Practitioner

## 2016-08-18 VITALS — BP 122/70 | HR 80 | Temp 97.7°F | Resp 17 | Ht 68.0 in | Wt 244.6 lb

## 2016-08-18 DIAGNOSIS — Z794 Long term (current) use of insulin: Secondary | ICD-10-CM

## 2016-08-18 DIAGNOSIS — K5900 Constipation, unspecified: Secondary | ICD-10-CM

## 2016-08-18 DIAGNOSIS — E2839 Other primary ovarian failure: Secondary | ICD-10-CM

## 2016-08-18 DIAGNOSIS — I10 Essential (primary) hypertension: Secondary | ICD-10-CM

## 2016-08-18 DIAGNOSIS — N184 Chronic kidney disease, stage 4 (severe): Secondary | ICD-10-CM | POA: Diagnosis not present

## 2016-08-18 DIAGNOSIS — E1165 Type 2 diabetes mellitus with hyperglycemia: Secondary | ICD-10-CM

## 2016-08-18 DIAGNOSIS — E1122 Type 2 diabetes mellitus with diabetic chronic kidney disease: Secondary | ICD-10-CM

## 2016-08-18 DIAGNOSIS — E785 Hyperlipidemia, unspecified: Secondary | ICD-10-CM | POA: Diagnosis not present

## 2016-08-18 DIAGNOSIS — IMO0002 Reserved for concepts with insufficient information to code with codable children: Secondary | ICD-10-CM

## 2016-08-18 DIAGNOSIS — I5032 Chronic diastolic (congestive) heart failure: Secondary | ICD-10-CM | POA: Diagnosis not present

## 2016-08-18 LAB — HEMOGLOBIN A1C
Hgb A1c MFr Bld: 6.6 % — ABNORMAL HIGH (ref <5.7)
Mean Plasma Glucose: 143 mg/dL

## 2016-08-18 LAB — BASIC METABOLIC PANEL WITH GFR
BUN: 32 mg/dL — ABNORMAL HIGH (ref 7–25)
CALCIUM: 10.2 mg/dL (ref 8.6–10.4)
CO2: 26 mmol/L (ref 20–31)
CREATININE: 1.44 mg/dL — AB (ref 0.60–0.88)
Chloride: 104 mmol/L (ref 98–110)
GFR, Est African American: 38 mL/min — ABNORMAL LOW (ref >=60)
GFR, Est Non African American: 33 mL/min — ABNORMAL LOW (ref >=60)
GLUCOSE: 122 mg/dL — AB (ref 65–99)
Potassium: 4.8 mmol/L (ref 3.5–5.3)
Sodium: 143 mmol/L (ref 135–146)

## 2016-08-18 NOTE — Progress Notes (Signed)
Careteam: Patient Care Team: Lauree Chandler, NP as PCP - General (Nurse Practitioner) Tanda Rockers, MD as Consulting Physician (Pulmonary Disease) Larey Dresser, MD as Consulting Physician (Cardiology)  Advanced Directive information Does Patient Have a Medical Advance Directive?: No  Allergies  Allergen Reactions  . Penicillins Other (See Comments)    Throat felt tight    Chief Complaint  Patient presents with  . Medical Management of Chronic Issues    6 month follow up     HPI: Patient is a 83 y.o. female seen in the office today for a routine follow-up. No concerns or complaints today.   Fasting blood sugars at home typically in the 140s. Following with Tivis Ringer, pharmD.  Following with Dr. Aundra Dubin (cardiologist) for HTN, CHF, and PE. Last appointment 11/13 with orders to stop Hydralazine and follow-up in 6 months. Blood pressure has been stable.   Does not add salt when cooking but does sometimes eat a TV dinner which she is aware is high in sodium.   Does exercise in the chair.   Does not take the influenza or PNA vaccines.   Review of Systems:  Review of Systems  Constitutional: Negative for activity change, appetite change, fatigue and unexpected weight change.  HENT: Negative for congestion and hearing loss.   Eyes: Negative.   Respiratory: Negative for cough and shortness of breath.   Cardiovascular: Negative for chest pain, palpitations and leg swelling.  Gastrointestinal: Positive for constipation. Negative for abdominal pain and diarrhea.  Genitourinary: Negative for difficulty urinating and dysuria.       Incontinence of bladder  Musculoskeletal: Positive for gait problem (uses walker). Negative for arthralgias and myalgias.       Joint stiffness  Skin: Negative for color change and wound.       Had place frozen on left index finger knuckle in December  Neurological: Negative for dizziness and weakness.  Psychiatric/Behavioral: Negative  for agitation, behavioral problems, confusion and sleep disturbance.   Past Medical History:  Diagnosis Date  . Arthritis    bilateral knees  . CHF (congestive heart failure) ([place])   . Diabetes mellitus without complication ([place])   . Edema   . Hyperlipidemia   . Hypertension   . Hypothyroidism   . Pulmonary embolism ([place])   . Urinary incontinence    Past Surgical History:  Procedure Laterality Date  . BLADDER SURGERY  2000   Dr.Tannerbaum (Bladder Tact)  . CATARACT EXTRACTION  03/2013   Dr.Shapiro  . CHOLECYSTECTOMY    . EYE SURGERY     bilateral cataracts  . [facility]   . right elbow  1999   x 2, still has one piece of metal in it  . TONSILLECTOMY AND ADENOIDECTOMY  1937   Dr.Brewer   Social History:   reports that she has never smoked. She has never used smokeless tobacco. She reports that she does not drink alcohol or use drugs.  Family History  Problem Relation Age of Onset  . Suicidality Father   . Cancer Brother   . Cancer Maternal Grandfather   . Diabetes Maternal Grandmother   . Melanoma Brother     Medications: Patient's Medications  New Prescriptions   No medications on file  Previous Medications   APIXABAN (ELIQUIS) 5 MG TABS TABLET    Take 0.5 tablets (2.5 mg total) by mouth 2 (two) times daily.   BD PEN NEEDLE NANO U/F 32G X  4 MM MISC    daily. use as directed   CHOLECALCIFEROL (VITAMIN D3) 5000 UNITS CHEW    Chew 1 tablet by mouth daily.   DOCUSATE SODIUM (COLACE) 100 MG CAPSULE    Take 1 capsule (100 mg total) by mouth 2 (two) times daily.   FEEDING SUPPLEMENT, ENSURE COMPLETE, (ENSURE COMPLETE) LIQD    Take 237 mLs by mouth 2 (two) times daily between meals as needed (As requested by patient).   FUROSEMIDE (LASIX) 20 MG TABLET    Take 0.5 tablets (10 mg total) by mouth 2 (two) times daily.   INSULIN DETEMIR (LEVEMIR FLEXPEN) 100 UNIT/ML PEN    Inject 20 units into the skin once daily to control blood sugar    LEVOTHYROXINE (SYNTHROID, LEVOTHROID) 100 MCG TABLET    TAKE 1 TABLET (100 MCG TOTAL) BY MOUTH DAILY BEFORE BREAKFAST   LISINOPRIL (PRINIVIL,ZESTRIL) 5 MG TABLET    Take 1 tablet (5 mg total) by mouth 2 (two) times daily. Take 1 Tab Twice Daily   MULTIPLE VITAMIN (MULTIVITAMIN WITH MINERALS) TABS TABLET    Take 1 tablet by mouth daily.   ONE TOUCH ULTRA TEST TEST STRIP    Use every other day to test blood sugar   ONETOUCH DELICA LANCETS 93Z MISC    USE EVERY OTHER DAY TO TEST BLOOD SUGAR   POLYETHYLENE GLYCOL (MIRALAX / GLYCOLAX) PACKET    Take 17 g by mouth as needed for mild constipation.    ROSUVASTATIN (CRESTOR) 20 MG TABLET    TAKE ONE TABLET BY MOUTH ONCE DAILY FOR CHOLESTEROL   TRADJENTA 5 MG TABS TABLET    TAKE 1 TABLET BY MOUTH EVERY DAY  Modified Medications   No medications on file  Discontinued Medications   No medications on file     Physical Exam:  Vitals:   08/18/16 1015  BP: 122/70  Pulse: 80  Resp: 17  Temp: 97.7 F (36.5 C)  TempSrc: Oral  SpO2: 94%  Weight: 244 lb 9.6 oz (110.9 kg)  Height: _0  (1.727 m)   Body mass index is 37.19 kg/m.  Physical Exam  Constitutional: She is oriented to person, place, and time. She appears well-developed and well-nourished.  HENT:  Head: Normocephalic and atraumatic.  Mouth/Throat: No oropharyngeal exudate.  Eyes: Pupils are equal, round, and reactive to light. No scleral icterus.  Neck: Normal range of motion. Neck supple. No thyromegaly present.  Cardiovascular: Normal rate, regular rhythm and intact distal pulses.  Exam reveals no gallop and no friction rub.   Murmur heard. Pulmonary/Chest: Effort normal and breath sounds normal.  Abdominal: Soft. Bowel sounds are normal. There is no tenderness.  Musculoskeletal:  Walks with walker   Neurological: She is alert and oriented to person, place, and time.  Skin: Skin is warm and dry. No rash noted.  Left index finger knuckle red d/t recent freezing by dermatologist in  December.   Psychiatric: She has a normal mood and affect. Her behavior is normal. Judgment and thought content normal.   Labs reviewed: Basic Metabolic Panel:  Recent Labs  01/18/16 1454 02/08/16 0921 03/14/16 1012 05/30/16 1030  NA 142 144 141 141  K 4.5 4.6 4.0 4.9  CL 98 104 106 101  CO2 _1 GLUCOSE 180* 126* 159* 168*  BUN 33* 29* 23* 29*  CREATININE 1.36* 1.25* 1.31* 1.44*  CALCIUM 10.2 10.2 10.0 10.6*  TSH 1.280  --   --   --    Liver  Function Tests:  Recent Labs  01/18/16 1454 02/08/16 0921 05/30/16 1030  AST _0 ALT _1 ALKPHOS 76 74 70  BILITOT 0.4 0.5 0.5  PROT 6.8 6.3 6.6  ALBUMIN 4.0 3.9 3.8   No results for input(s): LIPASE, AMYLASE in the last 8760 hours. No results for input(s): AMMONIA in the last 8760 hours. CBC:  Recent Labs  01/18/16 1454 03/14/16 1012 06/27/16 1222  WBC 9.0 8.9 9.5  NEUTROABS 6.1  --   --   HGB  --  13.6 13.0  HCT 42.0 43.5 41.0  MCV 90 92.4 91.3  PLT 200 179 185   Lipid Panel:  Recent Labs  02/08/16 0921  CHOL 164  HDL 65  LDLCALC 64  TRIG 175*  CHOLHDL 2.5   TSH:  Recent Labs  01/18/16 1454  TSH 1.280   A1C: Lab Results  Component Value Date   HGBA1C 7.8 (H) 02/08/2016     Assessment/Plan 1. Essential hypertension - Controlled on Lisinopril. Will cont current regimen. - Does her best to avoid sodium; does not add when cooking but admits that she does occasionally have a TV dinner.  - BMP with eGFR  2. Constipation, unspecified constipation type - Encouraged to use Miralax 17 G once daily for constipation.  - Other things to try include increasing fluid intake, prune juice, prunes, and increasing her fiber intake possibly with a fiber one bar daily.   3. Chronic diastolic CHF (congestive heart failure) (HCC) - Euvolemic. Followed by cardiology. Continue Lasix and Lisinopril.  - BMP with eGFR  4. Uncontrolled type 2 diabetes mellitus with stage 4 chronic kidney disease,  with long-term current use of insulin (H[facility]) - stable, Last HgbA1c 7.8 on 02/08/16, will re-check today.  - Continue on Levemir and Tradjenta. - Follows with Tivis Ringer, pharm D. Keep follow-up in February.  - Hemoglobin A1c today.  5. CKD (chronic kidney disease) stage 4, GFR 15-29 ml/min (HCC) - Stable. Continue to avoid nephrotoxic medications and stay hydrated.  - BMP with eGFR  6. Hyperlipidemia, unspecified hyperlipidemia type - Controlled. LDL 64 on 02/08/16. - Continue Crestor.  7. Estrogen deficiency - Dexa scan, future   Follow-up in 6 months for AWV with labs before.   Carlos American. Harle Battiest  [facility] [phone] 8 am - 5 pm) [phone] (after hours)

## 2016-08-18 NOTE — Patient Instructions (Signed)
Follow up in 6 months 

## 2016-08-20 ENCOUNTER — Other Ambulatory Visit: Payer: Self-pay | Admitting: Nurse Practitioner

## 2016-08-27 ENCOUNTER — Other Ambulatory Visit: Payer: Self-pay | Admitting: Nurse Practitioner

## 2016-08-27 ENCOUNTER — Other Ambulatory Visit (HOSPITAL_COMMUNITY): Payer: Self-pay | Admitting: Cardiology

## 2016-09-06 ENCOUNTER — Telehealth: Payer: Self-pay | Admitting: Nurse Practitioner

## 2016-09-06 NOTE — Telephone Encounter (Signed)
left another msg asking pt to confirm this AWV appt w/ nurse. VDM (DD)

## 2016-09-16 ENCOUNTER — Other Ambulatory Visit: Payer: Self-pay | Admitting: Nurse Practitioner

## 2016-09-29 ENCOUNTER — Other Ambulatory Visit: Payer: Medicare Other

## 2016-09-29 DIAGNOSIS — N184 Chronic kidney disease, stage 4 (severe): Secondary | ICD-10-CM

## 2016-09-29 DIAGNOSIS — Z794 Long term (current) use of insulin: Principal | ICD-10-CM

## 2016-09-29 DIAGNOSIS — E1122 Type 2 diabetes mellitus with diabetic chronic kidney disease: Secondary | ICD-10-CM

## 2016-09-29 DIAGNOSIS — E1165 Type 2 diabetes mellitus with hyperglycemia: Principal | ICD-10-CM

## 2016-09-29 DIAGNOSIS — IMO0002 Reserved for concepts with insufficient information to code with codable children: Secondary | ICD-10-CM

## 2016-09-29 LAB — COMPLETE METABOLIC PANEL WITH GFR
ALT: 9 U/L (ref 6–29)
AST: 12 U/L (ref 10–35)
Albumin: 3.7 g/dL (ref 3.6–5.1)
Alkaline Phosphatase: 72 U/L (ref 33–130)
BUN: 26 mg/dL — ABNORMAL HIGH (ref 7–25)
CO2: 26 mmol/L (ref 20–31)
Calcium: 10 mg/dL (ref 8.6–10.4)
Chloride: 106 mmol/L (ref 98–110)
Creat: 1.27 mg/dL — ABNORMAL HIGH (ref 0.60–0.88)
GFR, Est African American: 44 mL/min — ABNORMAL LOW (ref >=60)
GFR, Est Non African American: 38 mL/min — ABNORMAL LOW (ref >=60)
Glucose, Bld: 140 mg/dL — ABNORMAL HIGH (ref 65–99)
Potassium: 4.6 mmol/L (ref 3.5–5.3)
Sodium: 144 mmol/L (ref 135–146)
Total Bilirubin: 0.5 mg/dL (ref 0.2–1.2)
Total Protein: 6.6 g/dL (ref 6.1–8.1)

## 2016-09-30 LAB — HEMOGLOBIN A1C
Hgb A1c MFr Bld: 6.7 % — ABNORMAL HIGH (ref <5.7)
Mean Plasma Glucose: 146 mg/dL

## 2016-10-03 ENCOUNTER — Ambulatory Visit (INDEPENDENT_AMBULATORY_CARE_PROVIDER_SITE_OTHER): Payer: Medicare Other | Admitting: Pharmacotherapy

## 2016-10-03 ENCOUNTER — Other Ambulatory Visit: Payer: Self-pay

## 2016-10-03 ENCOUNTER — Encounter: Payer: Self-pay | Admitting: Pharmacotherapy

## 2016-10-03 VITALS — BP 138/72 | HR 94 | Resp 12 | Ht 68.0 in | Wt 246.0 lb

## 2016-10-03 DIAGNOSIS — N184 Chronic kidney disease, stage 4 (severe): Secondary | ICD-10-CM | POA: Diagnosis not present

## 2016-10-03 DIAGNOSIS — E1122 Type 2 diabetes mellitus with diabetic chronic kidney disease: Secondary | ICD-10-CM

## 2016-10-03 DIAGNOSIS — IMO0002 Reserved for concepts with insufficient information to code with codable children: Secondary | ICD-10-CM

## 2016-10-03 DIAGNOSIS — E1165 Type 2 diabetes mellitus with hyperglycemia: Principal | ICD-10-CM

## 2016-10-03 DIAGNOSIS — Z794 Long term (current) use of insulin: Secondary | ICD-10-CM

## 2016-10-03 DIAGNOSIS — I10 Essential (primary) hypertension: Secondary | ICD-10-CM | POA: Diagnosis not present

## 2016-10-03 NOTE — Patient Instructions (Signed)
Exercise goal is 30-45 minutes 5 x week

## 2016-10-03 NOTE — Progress Notes (Signed)
  Subjective:    Melissa Shannon is a 83 y.o.white female who presents for follow-up of Type 2 diabetes mellitus.  Current symptoms/problems include none and have been stable.  A1C:  6.7% (was 6.6%) Known diabetic complications: nephropathy, peripheral neuropathy, cerebrovascular disease and peripheral vascular disease Cardiovascular risk factors: advanced age (older than 49 for men, 45 for women), diabetes mellitus, hypertension and obesity (BMI >= 30 kg/m2) Current diabetic medications include insulin injections: Levemir and Tradjenta (oral). Levemir 24 units.  Tradjenta 38m daily. Eye exam current (within one year): yes Weight trend: stable  Current diet: in general, a "healthy" diet   Current exercise: none  Current monitoring regimen: home blood tests - 3 times weekly Home blood sugar records: fasting range: 137-169 Any episodes of hypoglycemia? no  Is She on ACE inhibitor or angiotensin II receptor blocker?  Yes  lisinopril (generic)  Wears glasses.  Denies problems with vision. Denies problems with feet. Has 3+ edema in feet. Nocturia usually once per night. No dysuria. Staying well hydrated.   Review of Systems A comprehensive review of systems was negative except for: Eyes: positive for contacts/glasses Cardiovascular: positive for lower extremity edema Genitourinary: positive for nocturia    Objective:    Ht 5' 8"$  (1.727 m)   Wt 246 lb (111.6 kg)   BMI 37.40 kg/m  BP 138/72  General:  alert, cooperative and no distress  Oropharynx: normal findings: lips normal without lesions and gums healthy   Eyes:  negative findings: lids and lashes normal and conjunctivae and sclerae normal   Ears:  external ears normal        Lung: clear to auscultation bilaterally  Heart:  regular rate and rhythm     Extremities: edema 3+ bilateral lower extremities  Skin: warm and dry, no hyperpigmentation, vitiligo, or suspicious lesions     Neuro: mental status, speech normal,  alert and oriented x3 and ambulates with assistance (walker)   Lab Review Glucose, Bld (mg/dL)  Date Value  09/29/2016 140 (H)  08/18/2016 122 (H)  05/30/2016 168 (H)   CO2 (mmol/L)  Date Value  09/29/2016 26  08/18/2016 26  05/30/2016 25   BUN (mg/dL)  Date Value  09/29/2016 26 (H)  08/18/2016 32 (H)  05/30/2016 29 (H)  02/08/2016 29 (H)  01/18/2016 33 (H)  11/06/2015 38 (H)   Creat (mg/dL)  Date Value  09/29/2016 1.27 (H)  08/18/2016 1.44 (H)  05/30/2016 1.44 (H)       Assessment:    Diabetes Mellitus type II, under excellent control.  A1C at goal <7% without hypoglycemia BP at goal <140/90 CKD is stable   Plan:    1.  Rx changes: none  2.  Continue Levemir 24 units daily. 3.  Continue Tradjenta 546mdaily. 4.  Counseled on nutrition goals. 5.  Counseled on need for routine exercise.  Goal is 30-45 minutes 5 x week. 6.  BP at goal <140/90.

## 2016-10-04 LAB — MICROALBUMIN / CREATININE URINE RATIO
Creatinine, Urine: 99 mg/dL (ref 20–320)
Microalb Creat Ratio: 4 mcg/mg creat (ref <30)
Microalb, Ur: 0.4 mg/dL

## 2016-11-06 ENCOUNTER — Other Ambulatory Visit: Payer: Self-pay | Admitting: Nurse Practitioner

## 2016-11-12 ENCOUNTER — Other Ambulatory Visit (HOSPITAL_COMMUNITY): Payer: Self-pay | Admitting: Cardiology

## 2016-11-20 ENCOUNTER — Other Ambulatory Visit (HOSPITAL_COMMUNITY): Payer: Self-pay | Admitting: Cardiology

## 2016-12-04 ENCOUNTER — Other Ambulatory Visit: Payer: Self-pay | Admitting: Nurse Practitioner

## 2016-12-21 ENCOUNTER — Encounter: Payer: Self-pay | Admitting: Nurse Practitioner

## 2016-12-22 ENCOUNTER — Other Ambulatory Visit: Payer: Medicare Other

## 2016-12-22 DIAGNOSIS — E1165 Type 2 diabetes mellitus with hyperglycemia: Principal | ICD-10-CM

## 2016-12-22 DIAGNOSIS — N184 Chronic kidney disease, stage 4 (severe): Secondary | ICD-10-CM

## 2016-12-22 DIAGNOSIS — E1122 Type 2 diabetes mellitus with diabetic chronic kidney disease: Secondary | ICD-10-CM

## 2016-12-22 DIAGNOSIS — IMO0002 Reserved for concepts with insufficient information to code with codable children: Secondary | ICD-10-CM

## 2016-12-22 DIAGNOSIS — Z794 Long term (current) use of insulin: Principal | ICD-10-CM

## 2016-12-22 LAB — COMPLETE METABOLIC PANEL WITH GFR
ALT: 9 U/L (ref 6–29)
AST: 12 U/L (ref 10–35)
Albumin: 4 g/dL (ref 3.6–5.1)
Alkaline Phosphatase: 68 U/L (ref 33–130)
BUN: 28 mg/dL — ABNORMAL HIGH (ref 7–25)
CO2: 24 mmol/L (ref 20–31)
Calcium: 10.3 mg/dL (ref 8.6–10.4)
Chloride: 103 mmol/L (ref 98–110)
Creat: 1.47 mg/dL — ABNORMAL HIGH (ref 0.60–0.88)
GFR, Est African American: 37 mL/min — ABNORMAL LOW (ref >=60)
GFR, Est Non African American: 32 mL/min — ABNORMAL LOW (ref >=60)
Glucose, Bld: 163 mg/dL — ABNORMAL HIGH (ref 65–99)
Potassium: 4.8 mmol/L (ref 3.5–5.3)
Sodium: 142 mmol/L (ref 135–146)
Total Bilirubin: 0.5 mg/dL (ref 0.2–1.2)
Total Protein: 6.7 g/dL (ref 6.1–8.1)

## 2016-12-23 LAB — HEMOGLOBIN A1C
Hgb A1c MFr Bld: 7 % — ABNORMAL HIGH (ref <5.7)
Mean Plasma Glucose: 154 mg/dL

## 2016-12-26 ENCOUNTER — Encounter: Payer: Self-pay | Admitting: Pharmacotherapy

## 2016-12-26 ENCOUNTER — Ambulatory Visit (INDEPENDENT_AMBULATORY_CARE_PROVIDER_SITE_OTHER): Payer: Medicare Other | Admitting: Pharmacotherapy

## 2016-12-26 VITALS — BP 138/80 | HR 76 | Resp 14 | Ht 68.0 in | Wt 246.0 lb

## 2016-12-26 DIAGNOSIS — E1122 Type 2 diabetes mellitus with diabetic chronic kidney disease: Secondary | ICD-10-CM | POA: Diagnosis not present

## 2016-12-26 DIAGNOSIS — N184 Chronic kidney disease, stage 4 (severe): Secondary | ICD-10-CM | POA: Diagnosis not present

## 2016-12-26 DIAGNOSIS — E1165 Type 2 diabetes mellitus with hyperglycemia: Secondary | ICD-10-CM | POA: Diagnosis not present

## 2016-12-26 DIAGNOSIS — IMO0002 Reserved for concepts with insufficient information to code with codable children: Secondary | ICD-10-CM

## 2016-12-26 DIAGNOSIS — Z794 Long term (current) use of insulin: Secondary | ICD-10-CM

## 2016-12-26 DIAGNOSIS — I10 Essential (primary) hypertension: Secondary | ICD-10-CM

## 2016-12-26 MED ORDER — INSULIN DETEMIR 100 UNIT/ML FLEXPEN: 24.0000 [IU] | PEN_INJECTOR | Freq: Every day | SUBCUTANEOUS | 5 refills | Status: DC

## 2016-12-26 NOTE — Patient Instructions (Signed)
Exercise goal is 30-45 minutes 5 x week.

## 2016-12-26 NOTE — Progress Notes (Signed)
  Subjective:    Melissa Shannon is a 83 y.o.white female who presents for follow-up of Type 2 diabetes mellitus.   A1C: 7.0% (was 6.7%) eGFR: 32 ml/min - basically baseline.  Current Levemir dose is 24 units daily She is also taking Tradjenta 35m daily  Struggles with healthy food choices. No routine exercise.  Limited due to knee pain.  Has muscle weakness. Denies problems with vision Denies problems with feet. Has significant peripheral edema. Nocturia 1-2 times per night No dysuria Staying well hydrated.   Review of Systems A comprehensive review of systems was negative except for: Eyes: positive for contacts/glasses Cardiovascular: positive for lower extremity edema Genitourinary: positive for nocturia Musculoskeletal: positive for arthralgias and muscle weakness    Objective:    BP 138/80   Pulse 76   Resp 14   Ht 5' 8" (1.727 m)   Wt 246 lb (111.6 kg)   BMI 37.40 kg/m   General:  alert, cooperative and no distress  Oropharynx: normal findings: lips normal without lesions and gums healthy   Eyes:  negative findings: lids and lashes normal and conjunctivae and sclerae normal   Ears:  external ears normal        Lung: clear to auscultation bilaterally  Heart:  regular rate and rhythm     Extremities: edema 3+ LE edema  Skin: dry     Neuro: mental status, speech normal, alert and oriented x3   Lab Review Glucose, Bld (mg/dL)  Date Value  12/22/2016 163 (H)  09/29/2016 140 (H)  08/18/2016 122 (H)   CO2 (mmol/L)  Date Value  12/22/2016 24  09/29/2016 26  08/18/2016 26   BUN (mg/dL)  Date Value  12/22/2016 28 (H)  09/29/2016 26 (H)  08/18/2016 32 (H)  02/08/2016 29 (H)  01/18/2016 33 (H)  11/06/2015 38 (H)   Creat (mg/dL)  Date Value  12/22/2016 1.47 (H)  09/29/2016 1.27 (H)  08/18/2016 1.44 (H)       Assessment:    Diabetes Mellitus type II, under good control.   A1C good at 7% BP at goal <130/80 Renal function stable.   Plan:     1.  Rx changes: none  2.  Continue Levemir 24 units daily. 3.  Continue Tradjenta 538mdaily. 4.  Counseled on nutrition goals. 5.  Counseled on benefit of routine exercise.  Goal is 30-45 minutes 5 x week. 6.  BP at goal <130/80 7.  Renal function is stable. 8.  RTC in 3 months.

## 2016-12-27 ENCOUNTER — Other Ambulatory Visit: Payer: Self-pay | Admitting: *Deleted

## 2016-12-27 MED ORDER — ONETOUCH DELICA LANCETS 33G MISC: 3 refills | Status: DC

## 2016-12-27 NOTE — Telephone Encounter (Signed)
[facility] 

## 2017-01-02 ENCOUNTER — Ambulatory Visit
Admission: RE | Admit: 2017-01-02 | Discharge: 2017-01-02 | Disposition: A | Discharge status: Home / Self Care | Payer: Medicare Other | Source: Ambulatory Visit | Attending: Nurse Practitioner | Admitting: Nurse Practitioner

## 2017-01-02 DIAGNOSIS — E2839 Other primary ovarian failure: Secondary | ICD-10-CM

## 2017-01-05 ENCOUNTER — Telehealth: Payer: Self-pay

## 2017-01-05 MED ORDER — ALENDRONATE SODIUM 70 MG PO TABS: 70.0000 mg | ORAL_TABLET | ORAL | 6 refills | Status: DC

## 2017-01-05 NOTE — Telephone Encounter (Signed)
-----   Message from Lauree Chandler, NP sent at 01/05/2017  3:29 PM EDT ----- Bone density reveals osteopenia, would recommend starting fosamax 70 mg weekly by mouth with water 30 mins before eating/drinking/medicine.  Recommended to take caltrate with D 600/400 twice a day as well.

## 2017-01-05 NOTE — Telephone Encounter (Signed)
Patient verbalized understanding of this result and recommendations. Rx sent to pharmacy for fosamax

## 2017-01-07 ENCOUNTER — Other Ambulatory Visit: Payer: Self-pay | Admitting: Nurse Practitioner

## 2017-01-10 ENCOUNTER — Other Ambulatory Visit: Payer: Self-pay | Admitting: Nurse Practitioner

## 2017-01-12 ENCOUNTER — Other Ambulatory Visit: Payer: Self-pay | Admitting: Nurse Practitioner

## 2017-01-14 ENCOUNTER — Other Ambulatory Visit: Payer: Self-pay | Admitting: Nurse Practitioner

## 2017-01-16 ENCOUNTER — Encounter: Payer: Self-pay | Admitting: Nurse Practitioner

## 2017-01-21 ENCOUNTER — Other Ambulatory Visit: Payer: Self-pay | Admitting: Nurse Practitioner

## 2017-01-30 DIAGNOSIS — L821 Other seborrheic keratosis: Secondary | ICD-10-CM | POA: Diagnosis not present

## 2017-01-30 DIAGNOSIS — Z85828 Personal history of other malignant neoplasm of skin: Secondary | ICD-10-CM | POA: Diagnosis not present

## 2017-01-30 DIAGNOSIS — L723 Sebaceous cyst: Secondary | ICD-10-CM | POA: Diagnosis not present

## 2017-01-30 DIAGNOSIS — L570 Actinic keratosis: Secondary | ICD-10-CM | POA: Diagnosis not present

## 2017-01-30 DIAGNOSIS — B078 Other viral warts: Secondary | ICD-10-CM | POA: Diagnosis not present

## 2017-01-30 DIAGNOSIS — D1801 Hemangioma of skin and subcutaneous tissue: Secondary | ICD-10-CM | POA: Diagnosis not present

## 2017-01-30 DIAGNOSIS — L853 Xerosis cutis: Secondary | ICD-10-CM | POA: Diagnosis not present

## 2017-02-05 ENCOUNTER — Other Ambulatory Visit (HOSPITAL_COMMUNITY): Payer: Self-pay | Admitting: Cardiology

## 2017-02-06 ENCOUNTER — Ambulatory Visit (HOSPITAL_COMMUNITY)
Admission: RE | Admit: 2017-02-06 | Discharge: 2017-02-06 | Disposition: A | Discharge status: Home / Self Care | Payer: Medicare Other | Source: Ambulatory Visit | Attending: Cardiology | Admitting: Cardiology

## 2017-02-06 VITALS — BP 153/89 | HR 106 | Wt 244.8 lb

## 2017-02-06 DIAGNOSIS — N183 Chronic kidney disease, stage 3 (moderate): Secondary | ICD-10-CM | POA: Insufficient documentation

## 2017-02-06 DIAGNOSIS — I130 Hypertensive heart and chronic kidney disease with heart failure and stage 1 through stage 4 chronic kidney disease, or unspecified chronic kidney disease: Secondary | ICD-10-CM | POA: Insufficient documentation

## 2017-02-06 DIAGNOSIS — K219 Gastro-esophageal reflux disease without esophagitis: Secondary | ICD-10-CM | POA: Diagnosis not present

## 2017-02-06 DIAGNOSIS — Z79899 Other long term (current) drug therapy: Secondary | ICD-10-CM | POA: Insufficient documentation

## 2017-02-06 DIAGNOSIS — E039 Hypothyroidism, unspecified: Secondary | ICD-10-CM | POA: Insufficient documentation

## 2017-02-06 DIAGNOSIS — E785 Hyperlipidemia, unspecified: Secondary | ICD-10-CM | POA: Diagnosis not present

## 2017-02-06 DIAGNOSIS — E784 Other hyperlipidemia: Secondary | ICD-10-CM

## 2017-02-06 DIAGNOSIS — Z7901 Long term (current) use of anticoagulants: Secondary | ICD-10-CM | POA: Diagnosis not present

## 2017-02-06 DIAGNOSIS — E1122 Type 2 diabetes mellitus with diabetic chronic kidney disease: Secondary | ICD-10-CM | POA: Insufficient documentation

## 2017-02-06 DIAGNOSIS — Z794 Long term (current) use of insulin: Secondary | ICD-10-CM | POA: Diagnosis not present

## 2017-02-06 DIAGNOSIS — I50810 Right heart failure, unspecified: Secondary | ICD-10-CM

## 2017-02-06 DIAGNOSIS — I5032 Chronic diastolic (congestive) heart failure: Secondary | ICD-10-CM | POA: Diagnosis present

## 2017-02-06 DIAGNOSIS — Z7983 Long term (current) use of bisphosphonates: Secondary | ICD-10-CM | POA: Insufficient documentation

## 2017-02-06 DIAGNOSIS — I2729 Other secondary pulmonary hypertension: Secondary | ICD-10-CM | POA: Diagnosis not present

## 2017-02-06 DIAGNOSIS — M179 Osteoarthritis of knee, unspecified: Secondary | ICD-10-CM | POA: Diagnosis not present

## 2017-02-06 DIAGNOSIS — E7849 Other hyperlipidemia: Secondary | ICD-10-CM

## 2017-02-06 LAB — LIPID PANEL
CHOLESTEROL: 141 mg/dL (ref 0–200)
HDL: 54 mg/dL (ref >40)
LDL Cholesterol: 49 mg/dL (ref 0–99)
TRIGLYCERIDES: 189 mg/dL — AB (ref <150)
Total CHOL/HDL Ratio: 2.6 RATIO
VLDL: 38 mg/dL (ref 0–40)

## 2017-02-06 LAB — BASIC METABOLIC PANEL
ANION GAP: 8 (ref 5–15)
BUN: 27 mg/dL — ABNORMAL HIGH (ref 6–20)
CO2: 25 mmol/L (ref 22–32)
Calcium: 9.6 mg/dL (ref 8.9–10.3)
Chloride: 107 mmol/L (ref 101–111)
Creatinine, Ser: 1.42 mg/dL — ABNORMAL HIGH (ref 0.44–1.00)
GFR calc Af Amer: 37 mL/min — ABNORMAL LOW (ref >60)
GFR calc non Af Amer: 32 mL/min — ABNORMAL LOW (ref >60)
Glucose, Bld: 234 mg/dL — ABNORMAL HIGH (ref 65–99)
POTASSIUM: 4.1 mmol/L (ref 3.5–5.1)
Sodium: 140 mmol/L (ref 135–145)

## 2017-02-06 LAB — CBC
HCT: 42.2 % (ref 36.0–46.0)
Hemoglobin: 13.4 g/dL (ref 12.0–15.0)
MCH: 28.6 pg (ref 26.0–34.0)
MCHC: 31.8 g/dL (ref 30.0–36.0)
MCV: 90 fL (ref 78.0–100.0)
PLATELETS: 173 10*3/uL (ref 150–400)
RBC: 4.69 MIL/uL (ref 3.87–5.11)
RDW: 14 % (ref 11.5–15.5)
WBC: 9 10*3/uL (ref 4.0–10.5)

## 2017-02-06 MED ORDER — LISINOPRIL 10 MG PO TABS: 10.0000 mg | ORAL_TABLET | Freq: Two times a day (BID) | ORAL | 3 refills | Status: DC

## 2017-02-06 NOTE — Patient Instructions (Signed)
Increase Lisinopril to 10 mg Twice daily   Labs today  Labs in 10 days  We will contact you in 6 months to schedule your next appointment.

## 2017-02-06 NOTE — Progress Notes (Signed)
Advanced Heart Failure Medication Review by a Pharmacist  Does the patient  feel that his/her medications are working for him/her?  yes  Has the patient been experiencing any side effects to the medications prescribed?  no  Does the patient measure his/her own blood pressure or blood glucose at home?  no   Does the patient have any problems obtaining medications due to transportation or finances?   yes  Understanding of regimen: good Understanding of indications: good Potential of compliance: good Patient understands to avoid NSAIDs. Patient understands to avoid decongestants.  Issues to address at subsequent visits: none   Pharmacist comments: Melissa Shannon is a pleasant 83 y/o female who presents with her medication list. She endorses good adherence to her medication regimen. Patient had no medication-related questions questions or concerns for me at this time.   [name] PharmD Candidate  Time with patient: 10 minutes Preparation and documentation time: 10 minutes Total time: 20 minutes

## 2017-02-06 NOTE — Progress Notes (Signed)
Patient ID: Melissa Shannon, female   DOB: 1928/05/23, 83 y.o.   MRN: [id number] PCP: Dr. Bubba Camp Cardiology: Dr. Aundra Dubin  83 yo with history of HTN, DM, and immobility due to osteoarthritis presents for followup of right heart failure and PE.  At baseline, patient is not very active.  She has knee arthritis that is very limiting.  She does not do much walking and uses a walker to get around.  She has been very limited for a few years now.  She was admitted in 10/15 with dyspnea.  The dyspnea began about 2-3 days prior to admission.  She would get short of breath after walking about 20 feet with her walker.  She was noted to be in CHF at admission with troponin elevated to 0.6.  Echo showed normal LV size and systolic function but severely dilated/dysfunctional RV with pulmonary hypertension.  V/Q scan was done and showed intermediate probability for PE.  She was seen by pulmonary and thought to have a PE.  Apixaban was started.  She was diuresed with IV Lasix.  Repeat echo was done 3/16.  This showed EF 55-60%, normal RV size and systolic function, and PA systolic pressure 28 mmHg.  V/Q scan in 5/16 was normal.   She was admitted in 4/16 with altered mental status, likely due to E coli urosepsis.   She returns for followup. Weight is up 9 lbs. She is still limited by her orthopedic problems.  She fatigues easily.  She uses a kitchen timer to make sure she walks 10 minutes 3 times a day.  Dyspnea after walking about 100 feet.  She uses a cane or walker.  She occasionally gets GERD-like symptoms when she lies down at night in bed, no chest pain with exertion.  BP is elevated today.   ECG (personally reviewed): NSR, normal.   Labs (06/18/14): K 4.4, creatinine 1.44, HCT 35.9, BNP 10167 => 1415 Labs (09/01/2014): K 4.2 Creatinine 1.23  Labs (2/16): K 4.3, creatinine 1.62, HCT 40.7 Labs (7/16): K 5.2, creatinine 1.66 Labs (6/17): creatinine 1.25, LDL 64 Labs (7/17): HCT 43.5 Labs (10/17): K 4.9, creatinine  1.44, BNP 25 Labs (2/18): K 4.6, creatinine 1.27  PMH: 1. HTN 2. Hyperlipidemia 3. Osteoarthritis: Involving knees, uses walker.  4. Type II diabetes 5. H/o CCY 6. CKD stage III 7. Hypothyroidism 8. PE: Admitted 10/15 with dyspnea/CHF.  Echo (10/15) with EF 60-65%, D-shaped interventricular septum, severely dilated RV with moderately decreased RV systolic function, PA systolic pressure 47 mmHg.  V/Q scan (10/15) was intermediate probability for PE. Patient was started on apixaban.  Echo (3/16) with EF 55-60%, basal inferior hypokinesis, normal RV size and systolic function, PA systolic pressure 28 mmHg. V/Q scan 5/16 was normal.  9. Nodules on CT chest 10/15: Per Dr Melvyn Novas, likely benign findings.  10. GERD  FH: No h/o venous thromboembolism, no heart problems that she knows of.   SH: Widow, lives alone, nonsmoker, 3 kids.   ROS: All systems reviewed and negative except as per HPI.   Current Outpatient Prescriptions  Medication Sig Dispense Refill  . alendronate (FOSAMAX) 70 MG tablet Take 1 tablet (70 mg total) by mouth once a week. Take with a full glass of water on an empty stomach. 30 tablet 6  . apixaban (ELIQUIS) 5 MG TABS tablet Take 0.5 tablets (2.5 mg total) by mouth 2 (two) times daily. 60 tablet 3  . BD PEN NEEDLE NANO U/F 32G X 4 MM MISC USE AS DIRECTED  ONCE DAILY 100 each 3  . Calcium Carbonate-Vitamin D (CALTRATE 600+D) 600-400 MG-UNIT tablet Take 1 tablet by mouth 2 (two) times daily.    . Cholecalciferol (VITAMIN D3) 5000 UNITS CHEW Chew 1 tablet by mouth daily.    Marland Kitchen docusate sodium (COLACE) 100 MG capsule Take 1 capsule (100 mg total) by mouth 2 (two) times daily. 10 capsule 0  . feeding supplement, ENSURE COMPLETE, (ENSURE COMPLETE) LIQD Take 237 mLs by mouth 2 (two) times daily between meals as needed (As requested by patient). 30 Bottle 0  . furosemide (LASIX) 20 MG tablet Take 0.5 tablets (10 mg total) by mouth 2 (two) times daily. 45 tablet 3  . Insulin Detemir  (LEVEMIR FLEXTOUCH) 100 UNIT/ML Pen Inject 24 Units into the skin daily. 15 pen 5  . levothyroxine (SYNTHROID, LEVOTHROID) 100 MCG tablet TAKE 1 TABLET (100 MCG TOTAL) BY MOUTH DAILY BEFORE BREAKFAST 90 tablet 1  . lisinopril (PRINIVIL,ZESTRIL) 10 MG tablet Take 1 tablet (10 mg total) by mouth 2 (two) times daily. 60 tablet 3  . Multiple Vitamin (MULTIVITAMIN WITH MINERALS) TABS tablet Take 1 tablet by mouth daily. 30 tablet 0  . ONE TOUCH ULTRA TEST test strip Use every other day to test blood sugar 100 each 3  . ONETOUCH DELICA LANCETS 62B MISC Use every other day to test blood sugar. Dx: E11.22 100 each 3  . rosuvastatin (CRESTOR) 20 MG tablet TAKE ONE TABLET BY MOUTH ONCE DAILY FOR CHOLESTEROL 30 tablet 3  . TRADJENTA 5 MG TABS tablet TAKE 1 TABLET BY MOUTH EVERY DAY 30 tablet 5   No current facility-administered medications for this encounter.    BP (!) 153/89   Pulse (!) 106   Wt 244 lb 12.8 oz (111 kg)   SpO2 94%   BMI 37.22 kg/m  General: NAD, obese Neck: JVP not elevated, no thyromegaly or thyroid nodule.  Lungs: CTAB CV: Nondisplaced PMI.  Heart regular S1/S2, no S3/S4, no murmur.  Thick lower legs but no pitting edema (lymphedema).  No carotid bruit.  Normal pedal pulses.  Abdomen: Soft, nontender, no hepatosplenomegaly, no distention.  Skin: Intact without lesions or rashes.  Neurologic: Alert and oriented x 3.  Psych: Normal affect. Extremities: No clubbing or cyanosis.  HEENT: Normal.   Assessment/Plan 1. Right heart failure: Echo on 10/15 showed dilated RV with decreased systolic function.  V/Q scan with suspected PE.  3/16 echo showed normal RV size and systolic function with normal estimate PA systolic pressure.  5/16 V/Q scan showed no PE.  Stable exertional dyspnea.  She does not appear volume overloaded on exam, dyspnea is likely to be primarily due to deconditioning. - Continue current Lasix regimen, she is taking 10 mg bid.  BMET today.  2. PE: V/Q scan  intermediate probability for PE in 10/15.  Pulmonary saw and suspect that she did indeed have PE.  The PE was spontaneous.  She is at risk given general immobility. No bleeding problems.  She did not have evidence for chronic PE on 5/16 V/Q scan.  Continue apixaban at 2.5 mg bid long-term to decrease long-term risk as she will likely remain fairly immobile long-term due to her arthritis. CBC today.   3. HTN: BP is elevated, increase lisinopril to 10 mg bid.  BMET in 10 days.  4. Hyperlipidemia: Continue Crestor, check lipids today.    Followup in 6 months.    Loralie Champagne 02/06/2017

## 2017-02-07 ENCOUNTER — Other Ambulatory Visit (HOSPITAL_COMMUNITY): Payer: Self-pay | Admitting: Cardiology

## 2017-02-07 ENCOUNTER — Encounter (HOSPITAL_COMMUNITY): Payer: Self-pay | Admitting: *Deleted

## 2017-02-09 ENCOUNTER — Telehealth (HOSPITAL_COMMUNITY): Payer: Self-pay | Admitting: *Deleted

## 2017-02-09 ENCOUNTER — Telehealth: Payer: Self-pay | Admitting: Internal Medicine

## 2017-02-09 NOTE — Telephone Encounter (Signed)
I called patient back and she apologized but she was wrong.  She was taking 5 mg BID and she will start taking the 10 mg BID.

## 2017-02-09 NOTE — Telephone Encounter (Signed)
-----   Message from Larey Dresser, MD sent at 02/07/2017  5:03 PM EDT ----- Increase to 20 mg bid with BMET 10 days.  ----- Message ----- From: Kennieth Rad, RN Sent: 02/07/2017   3:46 PM To: Larey Dresser, MD  Patient called regarding her lisinopril dose that we increased to 10 mg BID at her office visit yesterday.  When she got home she looked at her prescription bottle and she has been taking that same dose.  So did you want to increase it more or just leave it the same?

## 2017-02-09 NOTE — Telephone Encounter (Signed)
Pt states her dentist is aware of her Eliquis Rx but did not offer advice for stopping it prior to surgery. Pt would like to know from MW if she should stop taking Eliquis and for how long prior to a tooth extraction in July.   MW please advise. Thanks.

## 2017-02-10 NOTE — Telephone Encounter (Signed)
lmomtcb x 1 for the pt 

## 2017-02-10 NOTE — Telephone Encounter (Signed)
She is low risk in short term for clotting from eliquis so rec stop 2 days before and the pm after eg  take the last dose Monday pm for a procedure on Wednesday and resume Thursday pm if no active bleeding

## 2017-02-10 NOTE — Telephone Encounter (Signed)
Called and spoke with pt and she is aware of MW recs about her medication.  Nothing further is needed.

## 2017-02-10 NOTE — Telephone Encounter (Signed)
Patient returned phone call. Pt contact # [phone].Marland Kitchenert

## 2017-02-12 ENCOUNTER — Other Ambulatory Visit: Payer: Self-pay | Admitting: Nurse Practitioner

## 2017-02-16 ENCOUNTER — Ambulatory Visit (HOSPITAL_COMMUNITY)
Admission: RE | Admit: 2017-02-16 | Discharge: 2017-02-16 | Disposition: A | Discharge status: Home / Self Care | Payer: Medicare Other | Source: Ambulatory Visit | Attending: Internal Medicine | Admitting: Internal Medicine

## 2017-02-16 DIAGNOSIS — I5032 Chronic diastolic (congestive) heart failure: Secondary | ICD-10-CM | POA: Diagnosis present

## 2017-02-16 LAB — BASIC METABOLIC PANEL
Anion gap: 9 (ref 5–15)
BUN: 29 mg/dL — ABNORMAL HIGH (ref 6–20)
CALCIUM: 9.9 mg/dL (ref 8.9–10.3)
CO2: 30 mmol/L (ref 22–32)
CREATININE: 1.71 mg/dL — AB (ref 0.44–1.00)
Chloride: 103 mmol/L (ref 101–111)
GFR calc non Af Amer: 26 mL/min — ABNORMAL LOW (ref >60)
GFR, EST AFRICAN AMERICAN: 30 mL/min — AB (ref >60)
Glucose, Bld: 233 mg/dL — ABNORMAL HIGH (ref 65–99)
Potassium: 3.9 mmol/L (ref 3.5–5.1)
SODIUM: 142 mmol/L (ref 135–145)

## 2017-02-17 ENCOUNTER — Ambulatory Visit (INDEPENDENT_AMBULATORY_CARE_PROVIDER_SITE_OTHER): Payer: Medicare Other

## 2017-02-17 VITALS — BP 150/80 | HR 87 | Temp 98.9°F | Ht 68.0 in | Wt 243.0 lb

## 2017-02-17 DIAGNOSIS — Z0000 Encounter for general adult medical examination without abnormal findings: Secondary | ICD-10-CM

## 2017-02-17 NOTE — Progress Notes (Signed)
Subjective:   Melissa Shannon is a 83 y.o. female who presents for Medicare Annual (Subsequent) preventive examination.     Objective:     Vitals: BP (!) 150/80 (BP Location: Left Arm, Patient Position: Sitting)   Pulse 87   Temp 98.9 F (37.2 C) (Oral)   Ht 5\' 8"  (1.727 m)   Wt 243 lb (110.2 kg)   SpO2 93%   BMI 36.95 kg/m   Body mass index is 36.95 kg/m.   Tobacco History  Smoking Status  . Never Smoker  Smokeless Tobacco  . Never Used     Counseling given: Not Answered   Past Medical History:  Diagnosis Date  . Arthritis    bilateral knees  . CHF (congestive heart failure) ([place])   . Diabetes mellitus without complication ([place])   . Edema   . Hyperlipidemia   . Hypertension   . Hypothyroidism   . Pulmonary embolism ([place])   . Urinary incontinence    Past Surgical History:  Procedure Laterality Date  . BLADDER SURGERY  2000   Dr.Tannerbaum (Bladder Tact)  . CATARACT EXTRACTION  03/2013   Dr.Shapiro  . CHOLECYSTECTOMY    . EYE SURGERY     bilateral cataracts  . [facility]   . right elbow  1999   x 2, still has one piece of metal in it  . TONSILLECTOMY AND ADENOIDECTOMY  1937   Dr.Brewer   Family History  Problem Relation Age of Onset  . Suicidality Father   . Cancer Brother   . Cancer Maternal Grandfather   . Diabetes Maternal Grandmother   . Melanoma Brother    History  Sexual Activity  . Sexual activity: No    Outpatient Encounter Prescriptions as of 02/17/2017  Medication Sig  . alendronate (FOSAMAX) 70 MG tablet Take 1 tablet (70 mg total) by mouth once a week. Take with a full glass of water on an empty stomach.  Marland Kitchen apixaban (ELIQUIS) 5 MG TABS tablet Take 0.5 tablets (2.5 mg total) by mouth 2 (two) times daily.  . BD PEN NEEDLE NANO U/F 32G X 4 MM MISC USE AS DIRECTED ONCE DAILY  . Calcium Carbonate-Vitamin D (CALTRATE 600+D) 600-400 MG-UNIT tablet Take 1 tablet by mouth 2 (two) times daily.  .  Cholecalciferol (VITAMIN D3) 5000 UNITS CHEW Chew 1 tablet by mouth daily.  Marland Kitchen docusate sodium (COLACE) 100 MG capsule Take 1 capsule (100 mg total) by mouth 2 (two) times daily.  . feeding supplement, ENSURE COMPLETE, (ENSURE COMPLETE) LIQD Take 237 mLs by mouth 2 (two) times daily between meals as needed (As requested by patient).  . furosemide (LASIX) 20 MG tablet Take 0.5 tablets (10 mg total) by mouth 2 (two) times daily.  . Insulin Detemir (LEVEMIR FLEXTOUCH) 100 UNIT/ML Pen Inject 24 Units into the skin daily.  Marland Kitchen levothyroxine (SYNTHROID, LEVOTHROID) 100 MCG tablet TAKE 1 TABLET (100 MCG TOTAL) BY MOUTH DAILY BEFORE BREAKFAST  . lisinopril (PRINIVIL,ZESTRIL) 10 MG tablet Take 1 tablet (10 mg total) by mouth 2 (two) times daily.  . Multiple Vitamin (MULTIVITAMIN WITH MINERALS) TABS tablet Take 1 tablet by mouth daily.  . ONE TOUCH ULTRA TEST test strip Use every other day to test blood sugar  . ONETOUCH DELICA LANCETS 40J MISC Use every other day to test blood sugar. Dx: E11.22  . rosuvastatin (CRESTOR) 20 MG tablet TAKE ONE TABLET BY MOUTH ONCE DAILY FOR CHOLESTEROL  . TRADJENTA 5 MG  TABS tablet TAKE 1 TABLET BY MOUTH EVERY DAY  . [DISCONTINUED] furosemide (LASIX) 20 MG tablet Take 0.5 tablets (10 mg total) by mouth 2 (two) times daily.   No facility-administered encounter medications on file as of 02/17/2017.     Activities of Daily Living In your present state of health, do you have any difficulty performing the following activities: 02/17/2017  Hearing? Y  Vision? N  Difficulty concentrating or making decisions? Y  Walking or climbing stairs? Y  Dressing or bathing? Y  Doing errands, shopping? N  Preparing Food and eating ? N  Using the Toilet? N  In the past six months, have you accidently leaked urine? N  Do you have problems with loss of bowel control? N  Managing your Medications? N  Managing your Finances? N  Housekeeping or managing your Housekeeping? N  Some recent data  might be hidden    Patient Care Team: Lauree Chandler, NP as PCP - General (Nurse Practitioner) Tanda Rockers, MD as Consulting Physician (Pulmonary Disease) Larey Dresser, MD as Consulting Physician (Cardiology)    Assessment:     Exercise Activities and Dietary recommendations Current Exercise Habits: Home exercise routine, Type of exercise: walking, Time (Minutes): 15, Frequency (Times/Week): 5, Weekly Exercise (Minutes/Week): 75, Intensity: Mild  Goals    . Maintain Lifestyle          Patient will continue to watch salt intake, water intake, and walking      Fall Risk Fall Risk  02/17/2017 10/03/2016 08/18/2016 02/22/2016 02/11/2016  Falls in the past year? No No No No No   Depression Screen PHQ 2/9 Scores 02/17/2017 02/11/2016 01/18/2016 09/04/2014  PHQ - 2 Score 0 0 0 0     Cognitive Function MMSE - Mini Mental State Exam 02/17/2017 02/11/2016 09/04/2014  Orientation to time 5 5 5   Orientation to Place 4 5 5   Registration 3 3 3   Attention/ Calculation 5 5 5   Recall 1 2 3   Language- name 2 objects 2 2 2   Language- repeat 1 1 1   Language- follow 3 step command 2 3 3   Language- read & follow direction 1 1 1   Write a sentence 1 1 1   Copy design 1 1 1   Total score 26 29 30         Immunization History  Administered Date(s) Administered  . Pneumococcal Polysaccharide-23 04/06/2009  . Td 04/06/2009   Screening Tests Health Maintenance  Topic Date Due  . PNA vac Low Risk Adult (2 of 2 - PCV13) 04/06/2010  . OPHTHALMOLOGY EXAM  09/09/2016  . INFLUENZA VACCINE  03/15/2017  . HEMOGLOBIN A1C  06/24/2017  . FOOT EXAM  12/26/2017  . TETANUS/TDAP  04/07/2019  . DEXA SCAN  Completed      Plan:    I have personally reviewed and addressed the Medicare Annual Wellness questionnaire and have noted the following in the patient's chart:  A. Medical and social history B. Use of alcohol, tobacco or illicit drugs  C. Current medications and supplements D. Functional ability  and status E.  Nutritional status F.  Physical activity G. Advance directives H. List of other physicians I.  Hospitalizations, surgeries, and ER visits in previous 12 months J.  [place] to include hearing, vision, cognitive, depression L. Referrals and appointments - none  In addition, I have reviewed and discussed with patient certain preventive protocols, quality metrics, and best practice recommendations. A written personalized care plan for preventive services as well as  general preventive health recommendations were provided to patient.  See attached scanned questionnaire for additional information.   Signed,   Rich Reining, RN Nurse Health Advisor   Quick Notes   Health Maintenance: PNA 13 and flu due, declined. Eye exam due, pt will make appointment.     Abnormal Screen: MMSE 26/30 Passed clock drawing  BP: 150/80     Patient Concerns: None     Nurse Concerns: None

## 2017-02-17 NOTE — Patient Instructions (Signed)
Melissa Shannon , Thank you for taking time to come for your Medicare Wellness Visit. I appreciate your ongoing commitment to your health goals. Please review the following plan we discussed and let me know if I can assist you in the future.   Screening recommendations/referrals: Colonoscopy up to date, pt over age 81 Mammogram up to date, pt over age 96 Bone Density up to date Recommended yearly ophthalmology/optometry visit for glaucoma screening and checkup Recommended yearly dental visit for hygiene and checkup  Vaccinations: Influenza vaccine due, declined Pneumococcal vaccine 13 due, declined Tdap vaccine up to date. Due 04/07/2019 Shingles vaccine due, declined  Advanced directives: Advance directive discussed with you today. I have provided a copy for you to complete at home and have notarized. Once this is complete please bring a copy in to our office so we can scan it into your chart.   Conditions/risks identified: None  Next appointment: Sherrie Mustache, NP 02/20/2017 @ 10:45am   Preventive Care 65 Years and Older, Female Preventive care refers to lifestyle choices and visits with your health care provider that can promote health and wellness. What does preventive care include?  A yearly physical exam. This is also called an annual well check.  Dental exams once or twice a year.  Routine eye exams. Ask your health care provider how often you should have your eyes checked.  Personal lifestyle choices, including:  Daily care of your teeth and gums.  Regular physical activity.  Eating a healthy diet.  Avoiding tobacco and drug use.  Limiting alcohol use.  Practicing safe sex.  Taking low-dose aspirin every day.  Taking vitamin and mineral supplements as recommended by your health care provider. What happens during an annual well check? The services and screenings done by your health care provider during your annual well check will depend on your age, overall  health, lifestyle risk factors, and family history of disease. Counseling  Your health care provider may ask you questions about your:  Alcohol use.  Tobacco use.  Drug use.  Emotional well-being.  Home and relationship well-being.  Sexual activity.  Eating habits.  History of falls.  Memory and ability to understand (cognition).  Work and work Statistician.  Reproductive health. Screening  You may have the following tests or measurements:  Height, weight, and BMI.  Blood pressure.  Lipid and cholesterol levels. These may be checked every 5 years, or more frequently if you are over 77 years old.  Skin check.  Lung cancer screening. You may have this screening every year starting at age 62 if you have a 30-pack-year history of smoking and currently smoke or have quit within the past 15 years.  Fecal occult blood test (FOBT) of the stool. You may have this test every year starting at age 70.  Flexible sigmoidoscopy or colonoscopy. You may have a sigmoidoscopy every 5 years or a colonoscopy every 10 years starting at age 44.  Hepatitis C blood test.  Hepatitis B blood test.  Sexually transmitted disease (STD) testing.  Diabetes screening. This is done by checking your blood sugar (glucose) after you have not eaten for a while (fasting). You may have this done every 1-3 years.  Bone density scan. This is done to screen for osteoporosis. You may have this done starting at age 23.  Mammogram. This may be done every 1-2 years. Talk to your health care provider about how often you should have regular mammograms. Talk with your health care provider about your test results,  treatment options, and if necessary, the need for more tests. Vaccines  Your health care provider may recommend certain vaccines, such as:  Influenza vaccine. This is recommended every year.  Tetanus, diphtheria, and acellular pertussis (Tdap, Td) vaccine. You may need a Td booster every 10  years.  Zoster vaccine. You may need this after age 20.  Pneumococcal 13-valent conjugate (PCV13) vaccine. One dose is recommended after age 16.  Pneumococcal polysaccharide (PPSV23) vaccine. One dose is recommended after age 7. Talk to your health care provider about which screenings and vaccines you need and how often you need them. This information is not intended to replace advice given to you by your health care provider. Make sure you discuss any questions you have with your health care provider. Document Released: 08/28/2015 Document Revised: 04/20/2016 Document Reviewed: 06/02/2015 Elsevier Interactive Patient Education  2017 [place] Prevention in the Home Falls can cause injuries. They can happen to people of all ages. There are many things you can do to make your home safe and to help prevent falls. What can I do on the outside of my home?  Regularly fix the edges of walkways and driveways and fix any cracks.  Remove anything that might make you trip as you walk through a door, such as a raised step or threshold.  Trim any bushes or trees on the path to your home.  Use bright outdoor lighting.  Clear any walking paths of anything that might make someone trip, such as rocks or tools.  Regularly check to see if handrails are loose or broken. Make sure that both sides of any steps have handrails.  Any raised decks and porches should have guardrails on the edges.  Have any leaves, snow, or ice cleared regularly.  Use sand or salt on walking paths during winter.  Clean up any spills in your garage right away. This includes oil or grease spills. What can I do in the bathroom?  Use night lights.  Install grab bars by the toilet and in the tub and shower. Do not use towel bars as grab bars.  Use non-skid mats or decals in the tub or shower.  If you need to sit down in the shower, use a plastic, non-slip stool.  Keep the floor dry. Clean up any water that  spills on the floor as soon as it happens.  Remove soap buildup in the tub or shower regularly.  Attach bath mats securely with double-sided non-slip rug tape.  Do not have throw rugs and other things on the floor that can make you trip. What can I do in the bedroom?  Use night lights.  Make sure that you have a light by your bed that is easy to reach.  Do not use any sheets or blankets that are too big for your bed. They should not hang down onto the floor.  Have a firm chair that has side arms. You can use this for support while you get dressed.  Do not have throw rugs and other things on the floor that can make you trip. What can I do in the kitchen?  Clean up any spills right away.  Avoid walking on wet floors.  Keep items that you use a lot in easy-to-reach places.  If you need to reach something above you, use a strong step stool that has a grab bar.  Keep electrical cords out of the way.  Do not use floor polish or wax that makes floors  slippery. If you must use wax, use non-skid floor wax.  Do not have throw rugs and other things on the floor that can make you trip. What can I do with my stairs?  Do not leave any items on the stairs.  Make sure that there are handrails on both sides of the stairs and use them. Fix handrails that are broken or loose. Make sure that handrails are as long as the stairways.  Check any carpeting to make sure that it is firmly attached to the stairs. Fix any carpet that is loose or worn.  Avoid having throw rugs at the top or bottom of the stairs. If you do have throw rugs, attach them to the floor with carpet tape.  Make sure that you have a light switch at the top of the stairs and the bottom of the stairs. If you do not have them, ask someone to add them for you. What else can I do to help prevent falls?  Wear shoes that:  Do not have high heels.  Have rubber bottoms.  Are comfortable and fit you well.  Are closed at the  toe. Do not wear sandals.  If you use a stepladder:  Make sure that it is fully opened. Do not climb a closed stepladder.  Make sure that both sides of the stepladder are locked into place.  Ask someone to hold it for you, if possible.  Clearly mark and make sure that you can see:  Any grab bars or handrails.  First and last steps.  Where the edge of each step is.  Use tools that help you move around (mobility aids) if they are needed. These include:  Canes.  Walkers.  Scooters.  Crutches.  Turn on the lights when you go into a dark area. Replace any light bulbs as soon as they burn out.  Set up your furniture so you have a clear path. Avoid moving your furniture around.  If any of your floors are uneven, fix them.  If there are any pets around you, be aware of where they are.  Review your medicines with your doctor. Some medicines can make you feel dizzy. This can increase your chance of falling. Ask your doctor what other things that you can do to help prevent falls. This information is not intended to replace advice given to you by your health care provider. Make sure you discuss any questions you have with your health care provider. Document Released: 05/28/2009 Document Revised: 01/07/2016 Document Reviewed: 09/05/2014 Elsevier Interactive Patient Education  2017 [employer].

## 2017-02-20 ENCOUNTER — Encounter: Payer: Self-pay | Admitting: Nurse Practitioner

## 2017-02-20 ENCOUNTER — Ambulatory Visit: Payer: Medicare Other

## 2017-02-20 ENCOUNTER — Ambulatory Visit (INDEPENDENT_AMBULATORY_CARE_PROVIDER_SITE_OTHER): Payer: Medicare Other | Admitting: Nurse Practitioner

## 2017-02-20 ENCOUNTER — Telehealth (HOSPITAL_COMMUNITY): Payer: Self-pay | Admitting: *Deleted

## 2017-02-20 VITALS — BP 136/84 | HR 93 | Temp 98.2°F | Resp 18 | Ht 68.0 in | Wt 242.4 lb

## 2017-02-20 DIAGNOSIS — R413 Other amnesia: Secondary | ICD-10-CM | POA: Diagnosis not present

## 2017-02-20 DIAGNOSIS — E785 Hyperlipidemia, unspecified: Secondary | ICD-10-CM | POA: Diagnosis not present

## 2017-02-20 DIAGNOSIS — I10 Essential (primary) hypertension: Secondary | ICD-10-CM | POA: Diagnosis not present

## 2017-02-20 DIAGNOSIS — N184 Chronic kidney disease, stage 4 (severe): Secondary | ICD-10-CM

## 2017-02-20 DIAGNOSIS — Z0000 Encounter for general adult medical examination without abnormal findings: Secondary | ICD-10-CM | POA: Diagnosis not present

## 2017-02-20 DIAGNOSIS — I5022 Chronic systolic (congestive) heart failure: Secondary | ICD-10-CM

## 2017-02-20 LAB — TSH: TSH: 0.91 mIU/L

## 2017-02-20 MED ORDER — FUROSEMIDE 20 MG PO TABS: 10.0000 mg | ORAL_TABLET | Freq: Every day | ORAL | 3 refills | Status: DC

## 2017-02-20 MED ORDER — DONEPEZIL HCL 10 MG PO TABS: ORAL_TABLET | ORAL | 0 refills | Status: DC

## 2017-02-20 NOTE — Progress Notes (Signed)
Provider: Lauree Chandler, NP  Patient Care Team: Melissa Chandler, NP as PCP - General (Nurse Practitioner) Melissa Rockers, MD as Consulting Physician (Pulmonary Disease) Melissa Dresser, MD as Consulting Physician (Cardiology)  Extended Emergency Contact Information Primary Emergency Contact: Melissa Shannon Phone: [phone] Mobile Phone: [phone] Relation: Daughter Secondary Emergency Contact: [facility] Address: [place], [place] [address] Melissa Shannon of [place] Phone: [phone] Mobile Phone: [phone] Relation: Son Allergies  Allergen Reactions  . Penicillins Other (See Comments)    Throat felt tight   Code Status: DNR  Goals of Care: Advanced Directive information Advanced Directives 02/20/2017  Does Patient Have a Medical Advance Directive? No  Type of Advance Directive -  Does patient want to make changes to medical advance directive? Yes (MAU/Ambulatory/Procedural Areas - Information given)  Copy of [place] in Chart? -  Would patient like information on creating a medical advance directive? -     Chief Complaint  Patient presents with  . Medical Management of Chronic Issues    Pt is being seen for an extended visit for yearly exam and chronic conditions.     HPI: Patient is a 83 y.o. female seen in today for an annual wellness exam.   Major illnesses or hospitalization in the last year: none  Depression screen [facility] 2/9 02/20/2017 02/17/2017 02/11/2016 01/18/2016 09/04/2014  Decreased Interest 0 0 0 0 0  Down, Depressed, Hopeless 0 0 0 0 0  PHQ - 2 Score 0 0 0 0 0    Fall Risk  02/20/2017 02/17/2017 10/03/2016 08/18/2016 02/22/2016  Falls in the past year? No No No No No   MMSE - Mini Mental State Exam 02/17/2017 02/11/2016 09/04/2014  Orientation to time 5 5 5   Orientation to Place 4 5 5   Registration 3 3 3   Attention/ Calculation 5 5 5   Recall 1 2 3   Language- name 2 objects  2 2 2   Language- repeat 1 1 1   Language- follow 3 step command 2 3 3   Language- read & follow direction 1 1 1   Write a sentence 1 1 1   Copy design 1 1 1   Total score 26 29 30      Health Maintenance  Topic Date Due  . OPHTHALMOLOGY EXAM  09/09/2016  . PNA vac Low Risk Adult (2 of 2 - PCV13) 03/14/2018 (Originally 04/06/2010)  . INFLUENZA VACCINE  03/15/2017  . HEMOGLOBIN A1C  06/24/2017  . FOOT EXAM  12/26/2017  . TETANUS/TDAP  04/07/2019  . DEXA SCAN  Completed    Diet? Heart healthy Vision Screening Comments: Pt is going to set appointment with Melissa Shannon  Dentition: has appt next week but has not been in several years.   Hx of CHF- went to cardiologist last week, lowered lasix to daily due to increase in her Cr  Past Medical History:  Diagnosis Date  . Arthritis    bilateral knees  . CHF (congestive heart failure) ([place])   . Diabetes mellitus without complication ([place])   . Edema   . Hyperlipidemia   . Hypertension   . Hypothyroidism   . Pulmonary embolism ([place])   . Urinary incontinence     Past Surgical History:  Procedure Laterality Date  . BLADDER SURGERY  2000   MelissaTannerbaum (Bladder Tact)  . CATARACT EXTRACTION  03/2013   MelissaShapiro  . CHOLECYSTECTOMY    . EYE  SURGERY     bilateral cataracts  . [facility]   . right elbow  1999   x 2, still has one piece of metal in it  . TONSILLECTOMY AND ADENOIDECTOMY  1937   MelissaBrewer    Social History   Social History  . Marital status: Widowed    Spouse name: N/A  . Number of children: N/A  . Years of education: N/A   Occupational History  . Retired Pharmacist, hospital    Social History Main Topics  . Smoking status: Never Smoker  . Smokeless tobacco: Never Used  . Alcohol use No  . Drug use: No  . Sexual activity: No   Other Topics Concern  . None   Social History Narrative   Diet: Low sodium    Do you drink/eat things with caffeine? Yes, tea   Widowed, married 1952    Lives in a house, yes- 1 or more stories, 1 person in home. No pets    Current/past profession- Teacher    Patient exercises with walker twice daily            Family History  Problem Relation Age of Onset  . Suicidality Father   . Cancer Brother   . Cancer Maternal Grandfather   . Diabetes Maternal Grandmother   . Melanoma Brother     Review of Systems:  Review of Systems  Constitutional: Negative for activity change, appetite change, fatigue and unexpected weight change.  HENT: Negative for congestion and hearing loss.   Eyes: Negative.   Respiratory: Negative for cough and shortness of breath.   Cardiovascular: Negative for chest pain, palpitations and leg swelling.  Gastrointestinal: Negative for abdominal pain, constipation and diarrhea.  Genitourinary: Negative for difficulty urinating and dysuria.       Incontinence of bladder  Musculoskeletal: Positive for arthralgias (knees) and gait problem (uses walker). Negative for myalgias.       Joint stiffness  Skin: Negative for color change and wound.  Neurological: Negative for dizziness and weakness.  Psychiatric/Behavioral: Negative for agitation, behavioral problems, confusion and sleep disturbance.     Allergies as of 02/20/2017      Reactions   Penicillins Other (See Comments)   Throat felt tight      Medication List       Accurate as of 02/20/17 11:10 AM. Always use your most recent med list.          alendronate 70 MG tablet Commonly known as:  FOSAMAX Take 1 tablet (70 mg total) by mouth once a week. Take with a full glass of water on an empty stomach.   apixaban 5 MG Tabs tablet Commonly known as:  ELIQUIS Take 0.5 tablets (2.5 mg total) by mouth 2 (two) times daily.   BD PEN NEEDLE NANO U/F 32G X 4 MM Misc Generic drug:  Insulin Pen Needle USE AS DIRECTED ONCE DAILY   CALTRATE 600+D 600-400 MG-UNIT tablet Generic drug:  Calcium Carbonate-Vitamin D Take 1 tablet by mouth 2 (two) times daily.     docusate sodium 100 MG capsule Commonly known as:  COLACE Take 1 capsule (100 mg total) by mouth 2 (two) times daily.   feeding supplement (ENSURE COMPLETE) Liqd Take 237 mLs by mouth 2 (two) times daily between meals as needed (As requested by patient).   furosemide 20 MG tablet Commonly known as:  LASIX Take 0.5 tablets (10 mg total) by mouth 2 (two) times daily.   Insulin  Detemir 100 UNIT/ML Pen Commonly known as:  LEVEMIR FLEXTOUCH Inject 24 Units into the skin daily.   levothyroxine 100 MCG tablet Commonly known as:  SYNTHROID, LEVOTHROID TAKE 1 TABLET (100 MCG TOTAL) BY MOUTH DAILY BEFORE BREAKFAST   lisinopril 10 MG tablet Commonly known as:  PRINIVIL,ZESTRIL Take 1 tablet (10 mg total) by mouth 2 (two) times daily.   multivitamin with minerals Tabs tablet Take 1 tablet by mouth daily.   ONE TOUCH ULTRA TEST test strip Generic drug:  glucose blood Use every other day to test blood sugar   ONETOUCH DELICA LANCETS 18E Misc Use every other day to test blood sugar. Dx: E11.22   rosuvastatin 20 MG tablet Commonly known as:  CRESTOR TAKE ONE TABLET BY MOUTH ONCE DAILY FOR CHOLESTEROL   TRADJENTA 5 MG Tabs tablet Generic drug:  linagliptin TAKE 1 TABLET BY MOUTH EVERY DAY   Vitamin D3 5000 units Chew Chew 1 tablet by mouth daily.         Physical Exam: Vitals:   02/20/17 1053  BP: 136/84  Pulse: 93  Resp: 18  Temp: 98.2 F (36.8 C)  TempSrc: Oral  SpO2: 95%  Weight: 242 lb 6.4 oz (110 kg)  Height: 5\' 8"  (1.727 m)   Body mass index is 36.86 kg/m. Physical Exam  Constitutional: She is oriented to person, place, and time. She appears well-developed and well-nourished.  HENT:  Head: Normocephalic and atraumatic.  Mouth/Throat: No oropharyngeal exudate.  Eyes: Pupils are equal, round, and reactive to light. No scleral icterus.  Neck: Normal range of motion. Neck supple. No thyromegaly present.  Cardiovascular: Normal rate, regular rhythm and intact  distal pulses.  Exam reveals no gallop and no friction rub.   Murmur heard. Pulmonary/Chest: Effort normal and breath sounds normal.  Abdominal: Soft. Bowel sounds are normal. There is no tenderness.  Musculoskeletal: She exhibits no edema or tenderness.  Walks with walker   Neurological: She is alert and oriented to person, place, and time.  Skin: Skin is warm and dry. No rash noted.  Multiple small hemangioma to the skin Followed by dermatology    Psychiatric: She has a normal mood and affect. Her behavior is normal. Thought content normal.    Labs reviewed: Basic Metabolic Panel:  Recent Labs  12/22/16 0935 02/06/17 0953 02/16/17 0941  NA 142 140 142  K 4.8 4.1 3.9  CL 103 107 103  CO2 24 25 30   GLUCOSE 163* 234* 233*  BUN 28* 27* 29*  CREATININE 1.47* 1.42* 1.71*  CALCIUM 10.3 9.6 9.9   Liver Function Tests:  Recent Labs  05/30/16 1030 09/29/16 0927 12/22/16 0935  AST 12 12 12   ALT 10 9 9   ALKPHOS 70 72 68  BILITOT 0.5 0.5 0.5  PROT 6.6 6.6 6.7  ALBUMIN 3.8 3.7 4.0   No results for input(s): LIPASE, AMYLASE in the last 8760 hours. No results for input(s): AMMONIA in the last 8760 hours. CBC:  Recent Labs  03/14/16 1012 06/27/16 1222 02/06/17 0953  WBC 8.9 9.5 9.0  HGB 13.6 13.0 13.4  HCT 43.5 41.0 42.2  MCV 92.4 91.3 90.0  PLT 179 185 173   Lipid Panel:  Recent Labs  02/06/17 0953  CHOL 141  HDL 54  LDLCALC 49  TRIG 189*  CHOLHDL 2.6   Lab Results  Component Value Date   HGBA1C 7.0 (H) 12/22/2016   .  Procedures: No results found.  Assessment/Plan 1. Wellness examination Pt does not wish to  receive any vaccines, went over benefit of Prevnar, pneumococcal, and shingles vaccine -pt does not wish to have screening breast exam or mammogram "would not treat anything if I found it"  -dermatologist yearly -following with ophthalmology -has recently seen dentist and has follow up scheduled.  - DNR (Do Not Resuscitate)  2. Memory  loss -MMSE slow declining 26/30, previously 29/30 -CT from 2016 reviewed IMPRESSION: Age related cerebral atrophy, ventriculomegaly and periventricular white matter disease. No acute intracranial findings or mass lesions. - TSH - RPR - donepezil (ARICEPT) 10 MG tablet; 1/2 tablet by mouth at bedtime for 1 month then increase to 1 tablet at night  Dispense: 30 tablet; Refill: 0  3. CKD (chronic kidney disease) stage 4, GFR 15-29 ml/min (HCC) -lasix recently reduced by cardiologist, follow up BMP scheduled -Encouraged proper hydration and to avoid NSAIDS (Aleve, Advil, Motrin, Ibuprofen)   4. Essential hypertension Stable at this time. To cont current regimen  5. Hyperlipidemia, unspecified hyperlipidemia type LDL at goal on recent labs. conts on crestor.   6. Constipation -controlled on OTC  Melissa Shannon K. Harle Battiest  [facility] [phone] 8 am - 5 pm) [phone] (after hours)

## 2017-02-20 NOTE — Telephone Encounter (Signed)
-----   Message from Larey Dresser, MD sent at 02/16/2017 11:03 AM EDT ----- Creatinine higher, decrease Lasix to 10 mg daily.  BMET 10 days.

## 2017-02-20 NOTE — Patient Instructions (Addendum)
To start Aricept- 1/2 tablet which is 5 mg daily at bedtime for 1 month then increase to 1 tablet = 10 mg daily at bedtime.   Keep follow up with [facility]

## 2017-02-20 NOTE — Telephone Encounter (Signed)
Pt aware and agreeable.  

## 2017-02-21 LAB — RPR

## 2017-03-01 ENCOUNTER — Other Ambulatory Visit: Payer: Self-pay | Admitting: *Deleted

## 2017-03-01 MED ORDER — ROSUVASTATIN CALCIUM 20 MG PO TABS: ORAL_TABLET | ORAL | 3 refills | Status: DC

## 2017-03-01 NOTE — Telephone Encounter (Signed)
[facility] 

## 2017-03-02 ENCOUNTER — Ambulatory Visit (HOSPITAL_COMMUNITY)
Admission: RE | Admit: 2017-03-02 | Discharge: 2017-03-02 | Disposition: A | Discharge status: Home / Self Care | Payer: Medicare Other | Source: Ambulatory Visit | Attending: Cardiology | Admitting: Cardiology

## 2017-03-02 DIAGNOSIS — I5022 Chronic systolic (congestive) heart failure: Secondary | ICD-10-CM | POA: Diagnosis not present

## 2017-03-02 LAB — BASIC METABOLIC PANEL
ANION GAP: 8 (ref 5–15)
BUN: 25 mg/dL — ABNORMAL HIGH (ref 6–20)
CO2: 28 mmol/L (ref 22–32)
Calcium: 10 mg/dL (ref 8.9–10.3)
Chloride: 104 mmol/L (ref 101–111)
Creatinine, Ser: 1.37 mg/dL — ABNORMAL HIGH (ref 0.44–1.00)
GFR calc Af Amer: 39 mL/min — ABNORMAL LOW (ref >60)
GFR, EST NON AFRICAN AMERICAN: 34 mL/min — AB (ref >60)
GLUCOSE: 189 mg/dL — AB (ref 65–99)
POTASSIUM: 4.3 mmol/L (ref 3.5–5.1)
Sodium: 140 mmol/L (ref 135–145)

## 2017-03-08 ENCOUNTER — Telehealth: Payer: Self-pay | Admitting: Internal Medicine

## 2017-03-08 NOTE — Telephone Encounter (Signed)
Spoke with patient. She had called about this last month. Advised her that MW stated for her to stop taking the Eliquis 2 days before her procedure and then start again the afternoon of the following day of the procedure as long as she did not have any active bleeding. She verbalized understanding. Nothing else was needed at time of call.    Tanda Rockers, MD      5:19 AM  Note    She is low risk in short term for clotting from eliquis so rec stop 2 days before and the pm after eg  take the last dose Monday pm for a procedure on Wednesday and resume Thursday pm if no active bleeding

## 2017-03-20 ENCOUNTER — Other Ambulatory Visit: Payer: Self-pay

## 2017-03-20 ENCOUNTER — Other Ambulatory Visit: Payer: Self-pay | Admitting: Nurse Practitioner

## 2017-03-20 MED ORDER — INSULIN DETEMIR 100 UNIT/ML FLEXPEN: 24.0000 [IU] | PEN_INJECTOR | Freq: Every day | SUBCUTANEOUS | 5 refills | Status: DC

## 2017-03-23 ENCOUNTER — Other Ambulatory Visit: Payer: Medicare Other

## 2017-03-23 DIAGNOSIS — N184 Chronic kidney disease, stage 4 (severe): Principal | ICD-10-CM

## 2017-03-23 DIAGNOSIS — IMO0002 Reserved for concepts with insufficient information to code with codable children: Secondary | ICD-10-CM

## 2017-03-23 DIAGNOSIS — E1122 Type 2 diabetes mellitus with diabetic chronic kidney disease: Secondary | ICD-10-CM

## 2017-03-23 DIAGNOSIS — Z794 Long term (current) use of insulin: Principal | ICD-10-CM

## 2017-03-23 DIAGNOSIS — E1165 Type 2 diabetes mellitus with hyperglycemia: Principal | ICD-10-CM

## 2017-03-23 LAB — COMPLETE METABOLIC PANEL WITH GFR
ALT: 10 U/L (ref 6–29)
AST: 12 U/L (ref 10–35)
Albumin: 3.7 g/dL (ref 3.6–5.1)
Alkaline Phosphatase: 54 U/L (ref 33–130)
BUN: 25 mg/dL (ref 7–25)
CO2: 26 mmol/L (ref 20–32)
Calcium: 10.4 mg/dL (ref 8.6–10.4)
Chloride: 105 mmol/L (ref 98–110)
Creat: 1.47 mg/dL — ABNORMAL HIGH (ref 0.60–0.88)
GFR, Est African American: 37 mL/min — ABNORMAL LOW (ref >=60)
GFR, Est Non African American: 32 mL/min — ABNORMAL LOW (ref >=60)
Glucose, Bld: 136 mg/dL — ABNORMAL HIGH (ref 65–99)
Potassium: 4.3 mmol/L (ref 3.5–5.3)
Sodium: 142 mmol/L (ref 135–146)
Total Bilirubin: 0.6 mg/dL (ref 0.2–1.2)
Total Protein: 6.3 g/dL (ref 6.1–8.1)

## 2017-03-23 LAB — LIPID PANEL
Cholesterol: 162 mg/dL (ref <200)
HDL: 60 mg/dL (ref >50)
LDL Cholesterol: 72 mg/dL (ref <100)
Total CHOL/HDL Ratio: 2.7 Ratio (ref <5.0)
Triglycerides: 152 mg/dL — ABNORMAL HIGH (ref <150)
VLDL: 30 mg/dL (ref <30)

## 2017-03-24 LAB — HEMOGLOBIN A1C
Hgb A1c MFr Bld: 7.2 % — ABNORMAL HIGH (ref <5.7)
Mean Plasma Glucose: 160 mg/dL

## 2017-03-27 ENCOUNTER — Ambulatory Visit (INDEPENDENT_AMBULATORY_CARE_PROVIDER_SITE_OTHER): Payer: Medicare Other | Admitting: Pharmacotherapy

## 2017-03-27 ENCOUNTER — Encounter: Payer: Self-pay | Admitting: Pharmacotherapy

## 2017-03-27 VITALS — BP 114/70 | HR 90 | Ht 68.0 in | Wt 240.0 lb

## 2017-03-27 DIAGNOSIS — IMO0002 Reserved for concepts with insufficient information to code with codable children: Secondary | ICD-10-CM

## 2017-03-27 DIAGNOSIS — N184 Chronic kidney disease, stage 4 (severe): Secondary | ICD-10-CM | POA: Diagnosis not present

## 2017-03-27 DIAGNOSIS — E1165 Type 2 diabetes mellitus with hyperglycemia: Secondary | ICD-10-CM | POA: Diagnosis not present

## 2017-03-27 DIAGNOSIS — Z794 Long term (current) use of insulin: Secondary | ICD-10-CM

## 2017-03-27 DIAGNOSIS — E1122 Type 2 diabetes mellitus with diabetic chronic kidney disease: Secondary | ICD-10-CM

## 2017-03-27 DIAGNOSIS — I10 Essential (primary) hypertension: Secondary | ICD-10-CM

## 2017-03-27 MED ORDER — INSULIN DETEMIR 100 UNIT/ML FLEXPEN: 26.0000 [IU] | PEN_INJECTOR | Freq: Every day | SUBCUTANEOUS | 5 refills | Status: DC

## 2017-03-27 NOTE — Progress Notes (Signed)
  Subjective:    Melissa Shannon is a 83 y.o.white female who presents for follow-up of Type 2 diabetes mellitus.   A1C: 7.2% (was 7.0%) EGFR: 57m/min (stable) LDL: 72  Forgot to bring meter. Self reports BG - 1660mdl (highest), 12733ml (lowest) No hypoglycemia She thinks average is 140m66m  Current Levemir dose is 24 units daily Continues Tradjenta 5mg 70mly.  Trying to make healthy food choices. Avoiding sodium.  Avoiding desserts.  Does eat a lot of fruit. Trying to walk twice a day (15 minutes) for exercise.  Uses a walker to assist with ambulation.  Has significant peripheral edema. Wears glasses.  No problems with vision.  Has appt with Dr ShapiGershon Craneduled. Denies problems with feet. Nocturia 0-1 times per night No dysuria Staying well hydrated.  Review of Systems A comprehensive review of systems was negative except for: Eyes: positive for contacts/glasses Cardiovascular: positive for lower extremity edema Genitourinary: positive for nocturia    Objective:    BP 114/70   Pulse 90   Ht _0  (1.727 m)   Wt 240 lb (108.9 kg)   SpO2 92%   BMI 36.49 kg/m   General:  alert, cooperative and no distress  Oropharynx: normal findings: lips normal without lesions and gums healthy   Eyes:  negative findings: lids and lashes normal and conjunctivae and sclerae normal   Ears:  external ears normal        Lung: clear to auscultation bilaterally  Heart:  regular rate and rhythm     Extremities: edema 3+ lower extremities  Skin: warm and dry, no hyperpigmentation, vitiligo, or suspicious lesions     Neuro: mental status, speech normal, alert and oriented x3   Lab Review Glucose, Bld (mg/dL)  Date Value  03/23/2017 136 (H)  03/02/2017 189 (H)  02/16/2017 233 (H)   CO2 (mmol/L)  Date Value  03/23/2017 26  03/02/2017 28  02/16/2017 30   BUN (mg/dL)  Date Value  03/23/2017 25  03/02/2017 25 (H)  02/16/2017 29 (H)  02/08/2016 29 (H)  01/18/2016 33 (H)   11/06/2015 38 (H)   Creat (mg/dL)  Date Value  03/23/2017 1.47 (H)  12/22/2016 1.47 (H)  09/29/2016 1.27 (H)   Creatinine, Ser (mg/dL)  Date Value  03/02/2017 1.37 (H)  02/16/2017 1.71 (H)  02/06/2017 1.42 (H)       Assessment:    Diabetes Mellitus type II, under good control. A1C close to goal <7% BP at goal <130/80 LDL close to goal <70 Renal function is stable.   Plan:    1.  Rx changes: Increase Levemir 26 units daily.  2.  Continue Tradjenta 5mg d44my. 3.  Counseled on nutrition goals. 4.  Praised exercise efforts.  Goal is 30-45 minutes 5 x week. 5.  BP at goal <130/80 on current RX. 6.  LDL slightly above target at 72.  Continue Crestor. 7.  Kidney function is stable.

## 2017-03-27 NOTE — Patient Instructions (Signed)
Increase Levemir to 26 units daily

## 2017-04-01 ENCOUNTER — Other Ambulatory Visit: Payer: Self-pay | Admitting: Nurse Practitioner

## 2017-04-01 DIAGNOSIS — R413 Other amnesia: Secondary | ICD-10-CM

## 2017-04-03 ENCOUNTER — Other Ambulatory Visit: Payer: Self-pay | Admitting: *Deleted

## 2017-04-03 DIAGNOSIS — R413 Other amnesia: Secondary | ICD-10-CM

## 2017-04-03 MED ORDER — DONEPEZIL HCL 10 MG PO TABS: ORAL_TABLET | ORAL | 3 refills | Status: DC

## 2017-04-03 NOTE — Telephone Encounter (Signed)
[facility] 

## 2017-04-08 ENCOUNTER — Other Ambulatory Visit: Payer: Self-pay | Admitting: Nurse Practitioner

## 2017-05-08 ENCOUNTER — Encounter: Payer: Self-pay | Admitting: Pharmacotherapy

## 2017-05-22 ENCOUNTER — Telehealth: Payer: Self-pay | Admitting: Internal Medicine

## 2017-05-22 NOTE — Telephone Encounter (Signed)
Advised patient of MW's previous recommendations. She verbalized understanding. Nothing else needed at time of call.   Me      03/08/17 5:21 PM  Note    Spoke with patient. She had called about this last month. Advised her that MW stated for her to stop taking the Eliquis 2 days before her procedure and then start again the afternoon of the following day of the procedure as long as she did not have any active bleeding. She verbalized understanding. Nothing else was needed at time of call.    Tanda Rockers, MD      5:19 AM  Note    She is low risk in short term for clotting from eliquis so rec stop 2 days before and the pm after eg take the last dose Monday pm for a procedure on Wednesday and resume Thursday pm if no active bleeding

## 2017-05-27 ENCOUNTER — Other Ambulatory Visit (HOSPITAL_COMMUNITY): Payer: Self-pay | Admitting: Cardiology

## 2017-06-17 ENCOUNTER — Other Ambulatory Visit: Payer: Self-pay | Admitting: Nurse Practitioner

## 2017-06-23 ENCOUNTER — Other Ambulatory Visit: Payer: Medicare Other

## 2017-06-23 DIAGNOSIS — Z794 Long term (current) use of insulin: Principal | ICD-10-CM

## 2017-06-23 DIAGNOSIS — N184 Chronic kidney disease, stage 4 (severe): Principal | ICD-10-CM

## 2017-06-23 DIAGNOSIS — E1165 Type 2 diabetes mellitus with hyperglycemia: Principal | ICD-10-CM

## 2017-06-23 DIAGNOSIS — IMO0002 Reserved for concepts with insufficient information to code with codable children: Secondary | ICD-10-CM

## 2017-06-23 DIAGNOSIS — E1122 Type 2 diabetes mellitus with diabetic chronic kidney disease: Secondary | ICD-10-CM

## 2017-06-24 LAB — COMPLETE METABOLIC PANEL WITH GFR
AG Ratio: 1.4 (calc) (ref 1.0–2.5)
ALT: 8 U/L (ref 6–29)
AST: 12 U/L (ref 10–35)
Albumin: 3.8 g/dL (ref 3.6–5.1)
Alkaline phosphatase (APISO): 53 U/L (ref 33–130)
BUN/Creatinine Ratio: 16 (calc) (ref 6–22)
BUN: 21 mg/dL (ref 7–25)
CO2: 31 mmol/L (ref 20–32)
Calcium: 10.5 mg/dL — ABNORMAL HIGH (ref 8.6–10.4)
Chloride: 104 mmol/L (ref 98–110)
Creat: 1.33 mg/dL — ABNORMAL HIGH (ref 0.60–0.88)
GFR, Est African American: 42 mL/min/{1.73_m2} — ABNORMAL LOW (ref >=60)
GFR, Est Non African American: 36 mL/min/{1.73_m2} — ABNORMAL LOW (ref >=60)
Globulin: 2.8 g/dL (calc) (ref 1.9–3.7)
Glucose, Bld: 126 mg/dL — ABNORMAL HIGH (ref 65–99)
Potassium: 4.4 mmol/L (ref 3.5–5.3)
Sodium: 142 mmol/L (ref 135–146)
Total Bilirubin: 0.5 mg/dL (ref 0.2–1.2)
Total Protein: 6.6 g/dL (ref 6.1–8.1)

## 2017-06-24 LAB — HEMOGLOBIN A1C
Hgb A1c MFr Bld: 6.6 % of total Hgb — ABNORMAL HIGH (ref <5.7)
Mean Plasma Glucose: 143 (calc)
eAG (mmol/L): 7.9 (calc)

## 2017-06-26 ENCOUNTER — Ambulatory Visit: Payer: Medicare Other | Admitting: Pharmacotherapy

## 2017-06-28 LAB — HM DIABETES EYE EXAM

## 2017-06-29 ENCOUNTER — Encounter: Payer: Self-pay | Admitting: *Deleted

## 2017-07-03 ENCOUNTER — Ambulatory Visit: Payer: Medicare Other | Admitting: Pharmacotherapy

## 2017-07-03 ENCOUNTER — Encounter: Payer: Self-pay | Admitting: Pharmacotherapy

## 2017-07-03 VITALS — BP 126/70 | HR 81 | Resp 12 | Ht 68.0 in | Wt 235.0 lb

## 2017-07-03 DIAGNOSIS — E1122 Type 2 diabetes mellitus with diabetic chronic kidney disease: Secondary | ICD-10-CM

## 2017-07-03 DIAGNOSIS — N184 Chronic kidney disease, stage 4 (severe): Secondary | ICD-10-CM | POA: Diagnosis not present

## 2017-07-03 DIAGNOSIS — E1165 Type 2 diabetes mellitus with hyperglycemia: Secondary | ICD-10-CM

## 2017-07-03 DIAGNOSIS — IMO0002 Reserved for concepts with insufficient information to code with codable children: Secondary | ICD-10-CM

## 2017-07-03 DIAGNOSIS — Z794 Long term (current) use of insulin: Secondary | ICD-10-CM | POA: Diagnosis not present

## 2017-07-03 DIAGNOSIS — I10 Essential (primary) hypertension: Secondary | ICD-10-CM | POA: Diagnosis not present

## 2017-07-03 NOTE — Progress Notes (Signed)
  Subjective:    Melissa Shannon is a 83 y.o.white female who presents for follow-up of Type 2 diabetes mellitus.  A1C: 6.6% (was 7.2%) EGFR: 36 (stable)  She plans to have her remaining teeth extracted the week after Thanksgiving.  Then she will be fitted with temporary dentures.  Plan is for process to be complete after Christmas. Current dose of Levemir 26 units daily Continues Tradjenta '5mg'$  daily Forgot to bring blood glucose meter.  She reports average 120-'140mg'$ /dl Denies hypoglycemia.  Reports lowest BG: '120mg'$ /dl  Trying to eat healthy choices.  Limited to soup recently - sodium free. Exercise is limited due to mobility issues.  Tries to walk and do chair exercises. Wears glasses.  Denies problems with vision.  Saw Dr Gershon Crane last week. Has 3+ edema in LE Denies problems with feet. Nocturia 0-1 times per night. No dysuria Staying well hydrated.   Review of Systems A comprehensive review of systems was negative except for: Eyes: positive for contacts/glasses Cardiovascular: positive for lower extremity edema Genitourinary: positive for nocturia    Objective:    BP 126/70   Pulse 81   Resp 12   Ht '5\' 8"'$  (1.727 m)   Wt 235 lb (106.6 kg)   SpO2 96%   BMI 35.73 kg/m   General:  alert, cooperative and no distress  Oropharynx: normal findings: lips normal without lesions and gums healthy and abnormal findings: dentition: poor   Eyes:  negative findings: lids and lashes normal and corneas clear   Ears:  external ears normal        Lung: clear to auscultation bilaterally  Heart:  regular rate and rhythm     Extremities: edema lower extremities  Skin: warm and dry, no hyperpigmentation, vitiligo, or suspicious lesions     Neuro: mental status, speech normal, alert and oriented x3 and reflexes normal and symmetric   Lab Review Glucose, Bld (mg/dL)  Date Value  06/23/2017 126 (H)  03/23/2017 136 (H)  03/02/2017 189 (H)   CO2 (mmol/L)  Date Value  06/23/2017 31   03/23/2017 26  03/02/2017 28   BUN (mg/dL)  Date Value  06/23/2017 21  03/23/2017 25  03/02/2017 25 (H)  02/08/2016 29 (H)  01/18/2016 33 (H)  11/06/2015 38 (H)   Creat (mg/dL)  Date Value  06/23/2017 1.33 (H)  03/23/2017 1.47 (H)  12/22/2016 1.47 (H)   Creatinine, Ser (mg/dL)  Date Value  03/02/2017 1.37 (H)  02/16/2017 1.71 (H)  02/06/2017 1.42 (H)       Assessment:    Diabetes Mellitus type II, under excellent control. A1C is at goal <7% BP at goal <130/80 Kidney function is stable   Plan:    1.  Rx changes: none  2.  Continue Levemir 26 units daily. 3.  Continue Tradjenta '5mg'$  daily 4.  Counseled on nutrition goals. 5.  Praised exercise efforts.  Goal is 30-45 minutes 5 x week. 6.  BP at goal <130/80. 7.  Kidney function is stable

## 2017-07-03 NOTE — Patient Instructions (Signed)
Keep up the great work!  Have a wonderful Thanksgiving!

## 2017-08-21 ENCOUNTER — Ambulatory Visit (INDEPENDENT_AMBULATORY_CARE_PROVIDER_SITE_OTHER): Payer: Medicare Other | Admitting: Nurse Practitioner

## 2017-08-21 ENCOUNTER — Encounter: Payer: Self-pay | Admitting: Nurse Practitioner

## 2017-08-21 VITALS — BP 124/72 | HR 98 | Temp 98.3°F | Resp 17 | Ht 68.0 in | Wt 228.0 lb

## 2017-08-21 DIAGNOSIS — N184 Chronic kidney disease, stage 4 (severe): Secondary | ICD-10-CM

## 2017-08-21 DIAGNOSIS — I5032 Chronic diastolic (congestive) heart failure: Secondary | ICD-10-CM

## 2017-08-21 DIAGNOSIS — IMO0002 Reserved for concepts with insufficient information to code with codable children: Secondary | ICD-10-CM

## 2017-08-21 DIAGNOSIS — I10 Essential (primary) hypertension: Secondary | ICD-10-CM

## 2017-08-21 DIAGNOSIS — E1165 Type 2 diabetes mellitus with hyperglycemia: Secondary | ICD-10-CM

## 2017-08-21 DIAGNOSIS — Z794 Long term (current) use of insulin: Secondary | ICD-10-CM | POA: Diagnosis not present

## 2017-08-21 DIAGNOSIS — E1122 Type 2 diabetes mellitus with diabetic chronic kidney disease: Secondary | ICD-10-CM | POA: Diagnosis not present

## 2017-08-21 DIAGNOSIS — K5900 Constipation, unspecified: Secondary | ICD-10-CM | POA: Diagnosis not present

## 2017-08-21 DIAGNOSIS — E7849 Other hyperlipidemia: Secondary | ICD-10-CM | POA: Diagnosis not present

## 2017-08-21 NOTE — Progress Notes (Signed)
Careteam: Patient Care Team: Lauree Chandler, NP as PCP - General (Nurse Practitioner) Tanda Rockers, MD as Consulting Physician (Pulmonary Disease) Larey Dresser, MD as Consulting Physician (Cardiology)  Advanced Directive information Does Patient Have a Medical Advance Directive?: Yes, Type of Advance Directive: Out of facility DNR (pink MOST or yellow form), Pre-existing out of facility DNR order (yellow form or pink MOST form): Yellow form placed in chart (order not valid for inpatient use), Does patient want to make changes to medical advance directive?: No - Patient declined  Allergies  Allergen Reactions  . Penicillins Other (See Comments)    Throat felt tight    Chief Complaint  Patient presents with  . Medical Management of Chronic Issues    Pt is being seen for a 6 month routine visit. Pt reports that she has no concerns today.      HPI: Patient is a 84 y.o. female seen in the office today for routine follow up.   Got a new set of dentures which she is getting used to. All other teeth recently removed  DM- seeing [facility] routinely last A1c 06/23/17 at 6.6 at goal. No hypoglycemia. conts on levemir and tradjenta  CKD- Cr 1.33 on 06/23/17 which has been stable.   Hyperlipidemia- LDL 72 in Aug 2018 on crestor  HTN- blood pressure stable on lisinopril and lasix  Hypothyroid- TSH 0.91 on synthroid 100 mcg   CHF- stable, following with cardiology, conts on lisinopril and lasix  Hx of PE- conts on eliquis, no signs of recurrence.   Constipation- using colace and bowel are moving "the best they have"  Does not get vaccine  Review of Systems:  Review of Systems  Constitutional: Negative for chills, fever and weight loss.  HENT: Negative for tinnitus.   Respiratory: Negative for cough, sputum production and shortness of breath.   Cardiovascular: Positive for leg swelling. Negative for chest pain and palpitations.  Gastrointestinal: Negative for abdominal  pain, constipation, diarrhea and heartburn.  Genitourinary: Negative for dysuria, frequency and urgency.  Musculoskeletal: Positive for myalgias. Negative for back pain, falls and joint pain.       No pain knees are just weak and sore  Skin: Negative.   Neurological: Negative for dizziness and headaches.  Psychiatric/Behavioral: Negative for depression and memory loss. The patient does not have insomnia.     Past Medical History:  Diagnosis Date  . Arthritis    bilateral knees  . CHF (congestive heart failure) ([place])   . Diabetes mellitus without complication ([place])   . Edema   . Hyperlipidemia   . Hypertension   . Hypothyroidism   . Pulmonary embolism ([place])   . Urinary incontinence    Past Surgical History:  Procedure Laterality Date  . BLADDER SURGERY  2000   Dr.Tannerbaum (Bladder Tact)  . CATARACT EXTRACTION  03/2013   Dr.Shapiro  . CHOLECYSTECTOMY    . EYE SURGERY     bilateral cataracts  . [facility]   . right elbow  1999   x 2, still has one piece of metal in it  . TONSILLECTOMY AND ADENOIDECTOMY  1937   Dr.Brewer   Social History:   reports that  has never smoked. she has never used smokeless tobacco. She reports that she does not drink alcohol or use drugs.  Family History  Problem Relation Age of Onset  . Suicidality Father   . Cancer Brother   . Cancer  Maternal Grandfather   . Diabetes Maternal Grandmother   . Melanoma Brother     Medications:   Medication List        Accurate as of 08/21/17 10:34 AM. Always use your most recent med list.          alendronate 70 MG tablet Commonly known as:  FOSAMAX Take 1 tablet (70 mg total) by mouth once a week. Take with a full glass of water on an empty stomach.   apixaban 5 MG Tabs tablet Commonly known as:  ELIQUIS Take 0.5 tablets (2.5 mg total) by mouth 2 (two) times daily.   BD PEN NEEDLE NANO U/F 32G X 4 MM Misc Generic drug:  Insulin Pen Needle USE AS DIRECTED  ONCE DAILY   CALTRATE 600+D 600-400 MG-UNIT tablet Generic drug:  Calcium Carbonate-Vitamin D   docusate sodium 100 MG capsule Commonly known as:  COLACE Take 1 capsule (100 mg total) by mouth 2 (two) times daily.   donepezil 10 MG tablet Commonly known as:  ARICEPT Take one tablet by mouth once daily at bedtime to preserve memory   feeding supplement (ENSURE COMPLETE) Liqd Take 237 mLs by mouth 2 (two) times daily between meals as needed (As requested by patient).   furosemide 20 MG tablet Commonly known as:  LASIX Take 0.5 tablets (10 mg total) by mouth daily.   Insulin Detemir 100 UNIT/ML Pen Commonly known as:  LEVEMIR FLEXTOUCH Inject 26 Units into the skin daily.   levothyroxine 100 MCG tablet Commonly known as:  SYNTHROID, LEVOTHROID TAKE 1 TABLET BY MOUTH EVERY DAY BEFORE BREAKFAST   lisinopril 10 MG tablet Commonly known as:  PRINIVIL,ZESTRIL TAKE 1 TABLET BY MOUTH TWICE A DAY   multivitamin with minerals Tabs tablet Take 1 tablet by mouth daily.   ONE TOUCH ULTRA TEST test strip Generic drug:  glucose blood Use every other day to test blood sugar   ONETOUCH DELICA LANCETS 09N Misc Use every other day to test blood sugar. Dx: E11.22   rosuvastatin 20 MG tablet Commonly known as:  CRESTOR Take one tablet by mouth once daily for cholesterol   TRADJENTA 5 MG Tabs tablet Generic drug:  linagliptin TAKE 1 TABLET BY MOUTH EVERY DAY   Vitamin D3 5000 units Chew        Physical Exam:  Vitals:   08/21/17 1022  BP: 124/72  Pulse: 98  Resp: 17  Temp: 98.3 F (36.8 C)  TempSrc: Oral  SpO2: 96%  Weight: 228 lb (103.4 kg)  Height: 5\' 8"  (1.727 m)   Body mass index is 34.67 kg/m.  Physical Exam  Constitutional: She is oriented to person, place, and time. She appears well-developed and well-nourished.  HENT:  Head: Normocephalic and atraumatic.  Mouth/Throat: No oropharyngeal exudate.  Eyes: Pupils are equal, round, and reactive to light. No  scleral icterus.  Neck: Normal range of motion. Neck supple. No thyromegaly present.  Cardiovascular: Normal rate, regular rhythm and intact distal pulses. Exam reveals no gallop and no friction rub.  Murmur heard. Pulmonary/Chest: Effort normal and breath sounds normal.  Abdominal: Soft. Bowel sounds are normal. There is no tenderness.  Musculoskeletal: She exhibits no edema or tenderness.  Walks with walker   Neurological: She is alert and oriented to person, place, and time.  Skin: Skin is warm and dry. No rash noted.  Multiple small hemangioma to the skin Followed by dermatology    Psychiatric: She has a normal mood and affect. Her behavior is normal.  Thought content normal.    Labs reviewed: Basic Metabolic Panel: Recent Labs    02/20/17 1124 03/02/17 0916 03/23/17 0909 06/23/17 0925  NA  --  140 142 142  K  --  4.3 4.3 4.4  CL  --  104 105 104  CO2  --  28 26 31   GLUCOSE  --  189* 136* 126*  BUN  --  25* 25 21  CREATININE  --  1.37* 1.47* 1.33*  CALCIUM  --  10.0 10.4 10.5*  TSH 0.91  --   --   --    Liver Function Tests: Recent Labs    09/29/16 0927 12/22/16 0935 03/23/17 0909 06/23/17 0925  AST 12 12 12 12   ALT 9 9 10 8   ALKPHOS 72 68 54  --   BILITOT 0.5 0.5 0.6 0.5  PROT 6.6 6.7 6.3 6.6  ALBUMIN 3.7 4.0 3.7  --    No results for input(s): LIPASE, AMYLASE in the last 8760 hours. No results for input(s): AMMONIA in the last 8760 hours. CBC: Recent Labs    02/06/17 0953  WBC 9.0  HGB 13.4  HCT 42.2  MCV 90.0  PLT 173   Lipid Panel: Recent Labs    02/06/17 0953 03/23/17 0909  CHOL 141 162  HDL 54 60  LDLCALC 49 72  TRIG 189* 152*  CHOLHDL 2.6 2.7   TSH: Recent Labs    02/20/17 1124  TSH 0.91   A1C: Lab Results  Component Value Date   HGBA1C 6.6 (H) 06/23/2017     Assessment/Plan 1. Uncontrolled type 2 diabetes mellitus with stage 4 chronic kidney disease, with long-term current use of insulin ([place]) Controlled currently,  following with [facility] routinely. Has scheduled follow up scheduled in in February   2. Essential hypertension Controlled on current regimen   3. CKD (chronic kidney disease) stage 4, GFR 15-29 ml/min (HCC) Stable on last labs, has follow up labs scheduled next month. To avoid NSAID   4. Constipation, unspecified constipation type Stable on colace and increasing water intake.   5. Chronic diastolic CHF (congestive heart failure) (HCC) Stable, euvolemic, conts on lasix.   6. Other hyperlipidemia LDL 72, conts on crestor.   Next appt: 6 months with Dr Eulas Post for physician visit, 1 year with myself  Janett Billow K. Harle Battiest  [facility] [phone] 8 am - 5 pm) [phone] (after hours)

## 2017-08-21 NOTE — Patient Instructions (Signed)
Follow up in 6 months with Dr Eulas Post for routine follow up, then follow up in 1 year with Janett Billow

## 2017-08-22 ENCOUNTER — Other Ambulatory Visit: Payer: Self-pay | Admitting: Nurse Practitioner

## 2017-08-22 DIAGNOSIS — R413 Other amnesia: Secondary | ICD-10-CM

## 2017-09-23 ENCOUNTER — Other Ambulatory Visit: Payer: Self-pay | Admitting: Nurse Practitioner

## 2017-09-28 ENCOUNTER — Other Ambulatory Visit: Payer: Medicare Other

## 2017-09-28 DIAGNOSIS — E1122 Type 2 diabetes mellitus with diabetic chronic kidney disease: Secondary | ICD-10-CM

## 2017-09-28 DIAGNOSIS — IMO0002 Reserved for concepts with insufficient information to code with codable children: Secondary | ICD-10-CM

## 2017-09-28 DIAGNOSIS — Z794 Long term (current) use of insulin: Principal | ICD-10-CM

## 2017-09-28 DIAGNOSIS — N184 Chronic kidney disease, stage 4 (severe): Principal | ICD-10-CM

## 2017-09-28 DIAGNOSIS — E1165 Type 2 diabetes mellitus with hyperglycemia: Principal | ICD-10-CM

## 2017-09-29 LAB — MICROALBUMIN / CREATININE URINE RATIO
Creatinine, Urine: 156 mg/dL (ref 20–275)
Microalb Creat Ratio: 6 mcg/mg creat (ref <30)
Microalb, Ur: 1 mg/dL

## 2017-09-29 LAB — HEMOGLOBIN A1C
Hgb A1c MFr Bld: 6.4 % of total Hgb — ABNORMAL HIGH (ref <5.7)
Mean Plasma Glucose: 137 (calc)
eAG (mmol/L): 7.6 (calc)

## 2017-09-29 LAB — COMPLETE METABOLIC PANEL WITH GFR
AG Ratio: 1.5 (calc) (ref 1.0–2.5)
ALT: 9 U/L (ref 6–29)
AST: 12 U/L (ref 10–35)
Albumin: 3.8 g/dL (ref 3.6–5.1)
Alkaline phosphatase (APISO): 49 U/L (ref 33–130)
BUN/Creatinine Ratio: 18 (calc) (ref 6–22)
BUN: 27 mg/dL — ABNORMAL HIGH (ref 7–25)
CO2: 31 mmol/L (ref 20–32)
Calcium: 10.4 mg/dL (ref 8.6–10.4)
Chloride: 105 mmol/L (ref 98–110)
Creat: 1.46 mg/dL — ABNORMAL HIGH (ref 0.60–0.88)
GFR, Est African American: 37 mL/min/{1.73_m2} — ABNORMAL LOW (ref >=60)
GFR, Est Non African American: 32 mL/min/{1.73_m2} — ABNORMAL LOW (ref >=60)
Globulin: 2.5 g/dL (calc) (ref 1.9–3.7)
Glucose, Bld: 131 mg/dL — ABNORMAL HIGH (ref 65–99)
Potassium: 4.5 mmol/L (ref 3.5–5.3)
Sodium: 142 mmol/L (ref 135–146)
Total Bilirubin: 0.6 mg/dL (ref 0.2–1.2)
Total Protein: 6.3 g/dL (ref 6.1–8.1)

## 2017-10-01 ENCOUNTER — Other Ambulatory Visit (HOSPITAL_COMMUNITY): Payer: Self-pay | Admitting: Internal Medicine

## 2017-10-02 ENCOUNTER — Encounter: Payer: Self-pay | Admitting: Pharmacotherapy

## 2017-10-02 ENCOUNTER — Ambulatory Visit: Payer: Medicare Other | Admitting: Pharmacotherapy

## 2017-10-02 VITALS — BP 138/80 | HR 90 | Temp 98.3°F | Ht 68.0 in | Wt 227.0 lb

## 2017-10-02 DIAGNOSIS — E1122 Type 2 diabetes mellitus with diabetic chronic kidney disease: Secondary | ICD-10-CM

## 2017-10-02 DIAGNOSIS — I10 Essential (primary) hypertension: Secondary | ICD-10-CM

## 2017-10-02 DIAGNOSIS — N184 Chronic kidney disease, stage 4 (severe): Secondary | ICD-10-CM

## 2017-10-02 DIAGNOSIS — Z794 Long term (current) use of insulin: Secondary | ICD-10-CM

## 2017-10-02 DIAGNOSIS — E1165 Type 2 diabetes mellitus with hyperglycemia: Secondary | ICD-10-CM | POA: Diagnosis not present

## 2017-10-02 DIAGNOSIS — IMO0002 Reserved for concepts with insufficient information to code with codable children: Secondary | ICD-10-CM

## 2017-10-02 MED ORDER — INSULIN DETEMIR 100 UNIT/ML FLEXPEN: 22.0000 [IU] | PEN_INJECTOR | Freq: Every day | SUBCUTANEOUS | 5 refills | Status: DC

## 2017-10-02 NOTE — Progress Notes (Signed)
  Subjective:    Melissa Shannon is a 84 y.o.white female who presents for follow-up of Type 2 diabetes mellitus.   A1C: 6.4% (was 6.6%) EGFR: 32 ml/min SCr: 1.46  Did not bring BGM to office visit. Self reports BG: 60-82 She has had several values <70.  Current insulin dose is 26 units daily. Remains on Tradjenta.  Has a temporary set of lower dentures in place - not fitting well.  She is planning to get implants. Her diet has not been ideal. Trying to walk for exercise.  Limited due to knee pain. Has significant peripheral edema. Denies problems with feet. Denies problems with vision.  Saw Dr Gershon Crane in November 2018. Nocturia at least 2 times per night No dysuria Staying well hydrated.    Review of Systems  Positive for: Eyes: glasses Mouth:  Poorly fitting dentures Heart: bilateral LE edema GU:  nocturia   Objective:    BP 138/80   Pulse 90   Temp 98.3 F (36.8 C) (Oral)   Ht _0  (1.727 m)   Wt 227 lb (103 kg)   SpO2 97%   BMI 34.52 kg/m   General:  alert, cooperative and no distress  Oropharynx: normal findings: lips normal without lesions and gums healthy and abnormal findings: poorly fitting lower dentures   Eyes:  negative findings: lids and lashes normal and conjunctivae and sclerae normal   Ears:  External ears normal        Lung: clear to auscultation bilaterally  Heart:  regular rate and rhythm     Extremities: edema 3+ bilateral LE edema  Skin: warm and dry, no hyperpigmentation, vitiligo, or suspicious lesions     Neuro: mental status, speech normal, alert and oriented x3 and using walker for assistance   Lab Review Glucose, Bld (mg/dL)  Date Value  09/28/2017 131 (H)  06/23/2017 126 (H)  03/23/2017 136 (H)   CO2 (mmol/L)  Date Value  09/28/2017 31  06/23/2017 31  03/23/2017 26   BUN (mg/dL)  Date Value  09/28/2017 27 (H)  06/23/2017 21  03/23/2017 25  02/08/2016 29 (H)  01/18/2016 33 (H)  11/06/2015 38 (H)   Creat  (mg/dL)  Date Value  09/28/2017 1.46 (H)  06/23/2017 1.33 (H)  03/23/2017 1.47 (H)       Assessment:    Diabetes Mellitus type II, under excellent control. A1C <7%; however, having frequent hypoglycemia. BP slightly above target <130/80 CKD - stable   Plan:    1.  Rx changes: decrease Levemir to 22 units daily.  2.  Continue Tradjenta 50m daily.. 3.  Counseled on hypoglycemia. 4.  Getting her teeth fixed should help her with better dietary choices. 5.  Exercise as tolerated.  Goal is 30-45 minutes 5 x week.  Reviewed chair exercises. 6.  BP above goal today, was 124/72 at last OV.  Will continue current RX and monitor. 7.  CKD stable.

## 2017-10-02 NOTE — Patient Instructions (Signed)
Decrease Levemir to 22 units daily

## 2017-10-14 ENCOUNTER — Other Ambulatory Visit: Payer: Self-pay | Admitting: Nurse Practitioner

## 2017-10-21 ENCOUNTER — Other Ambulatory Visit: Payer: Self-pay | Admitting: Nurse Practitioner

## 2017-10-26 ENCOUNTER — Emergency Department (HOSPITAL_COMMUNITY)
Admission: EM | Admit: 2017-10-26 | Discharge: 2017-10-26 | Discharge status: Against Medical Advice | Payer: Medicare Other

## 2017-11-29 ENCOUNTER — Telehealth: Payer: Self-pay | Admitting: Nurse Practitioner

## 2017-12-04 ENCOUNTER — Other Ambulatory Visit: Payer: Self-pay

## 2017-12-04 DIAGNOSIS — IMO0002 Reserved for concepts with insufficient information to code with codable children: Secondary | ICD-10-CM

## 2017-12-04 DIAGNOSIS — E1122 Type 2 diabetes mellitus with diabetic chronic kidney disease: Secondary | ICD-10-CM

## 2017-12-04 DIAGNOSIS — E1165 Type 2 diabetes mellitus with hyperglycemia: Principal | ICD-10-CM

## 2017-12-04 DIAGNOSIS — N184 Chronic kidney disease, stage 4 (severe): Principal | ICD-10-CM

## 2017-12-04 DIAGNOSIS — Z794 Long term (current) use of insulin: Principal | ICD-10-CM

## 2017-12-08 NOTE — Telephone Encounter (Signed)
Opened in Error.

## 2018-01-15 ENCOUNTER — Telehealth: Payer: Self-pay | Admitting: Nurse Practitioner

## 2018-01-15 NOTE — Telephone Encounter (Signed)
I called the pt to schedule her AWV-S with Clarise Cruz on 02/23/18 at 10:00 before seeing provider.  There was no answer and no option to leave a message. Will try again later. VDM (DD)

## 2018-02-04 ENCOUNTER — Other Ambulatory Visit (HOSPITAL_COMMUNITY): Payer: Self-pay | Admitting: Internal Medicine

## 2018-02-05 ENCOUNTER — Ambulatory Visit (INDEPENDENT_AMBULATORY_CARE_PROVIDER_SITE_OTHER): Payer: Medicare Other | Admitting: Nurse Practitioner

## 2018-02-05 ENCOUNTER — Encounter: Payer: Self-pay | Admitting: Nurse Practitioner

## 2018-02-05 VITALS — BP 128/82 | HR 94 | Temp 98.4°F | Ht 68.0 in | Wt 224.0 lb

## 2018-02-05 DIAGNOSIS — M79605 Pain in left leg: Secondary | ICD-10-CM

## 2018-02-05 DIAGNOSIS — N184 Chronic kidney disease, stage 4 (severe): Secondary | ICD-10-CM | POA: Diagnosis not present

## 2018-02-05 DIAGNOSIS — Z794 Long term (current) use of insulin: Secondary | ICD-10-CM | POA: Diagnosis not present

## 2018-02-05 DIAGNOSIS — E1122 Type 2 diabetes mellitus with diabetic chronic kidney disease: Secondary | ICD-10-CM | POA: Diagnosis not present

## 2018-02-05 DIAGNOSIS — E1165 Type 2 diabetes mellitus with hyperglycemia: Secondary | ICD-10-CM | POA: Diagnosis not present

## 2018-02-05 DIAGNOSIS — IMO0002 Reserved for concepts with insufficient information to code with codable children: Secondary | ICD-10-CM

## 2018-02-05 MED ORDER — PREDNISONE 20 MG PO TABS: 20.0000 mg | ORAL_TABLET | Freq: Two times a day (BID) | ORAL | 0 refills | Status: DC

## 2018-02-05 MED ORDER — ONETOUCH ULTRA 2 W/DEVICE KIT: 1.0000 | PACK | Freq: Every day | 0 refills | Status: DC

## 2018-02-05 NOTE — Progress Notes (Signed)
Careteam: Patient Care Team: Lauree Chandler, NP as PCP - General (Nurse Practitioner) Tanda Rockers, MD as Consulting Physician (Pulmonary Disease) Larey Dresser, MD as Consulting Physician (Cardiology)  Advanced Directive information Does Patient Have a Medical Advance Directive?: Yes, Type of Advance Directive: Out of facility DNR (pink MOST or yellow form), Pre-existing out of facility DNR order (yellow form or pink MOST form): Yellow form placed in chart (order not valid for inpatient use)  Allergies  Allergen Reactions  . Penicillins Other (See Comments)    Throat felt tight    Chief Complaint  Patient presents with  . Medical Management of Chronic Issues    Pt is being seen for a routine visit.   . ACP    Pt has DNR  . Health Maintenance    Pt due for foot exam     HPI: Patient is a 84 y.o. female seen in the office today for routine follow up on diabetes.  Pt over due for A1c, last A1c on 09/28/16 at goal at 6.4.  Meter is not working correctly.  Reports blood sugars are around 150 fasting.  She is taking levemir 22 units on tradjenta.  Up to date on eye exam.   Reports pain on left outer thigh going down leg (can feel in toe) x 2 night. Had to get comfortable at night and did not know what to sleep.  Can not describe pain other than "uncomfortable" Denies back pain. No loss of control in her bowels or bladder No changes in gait. Walking helps pain.  Did not have anything to take at her house.  Worse at night. Can feel it now but bad at night. Uncomfortable now but 5-6/10 at night. Almost nauseated due to pain.  No injury.   Review of Systems:  Review of Systems  Constitutional: Negative for chills and fever.  Respiratory: Negative for cough and sputum production.   Cardiovascular: Negative for chest pain and palpitations.  Musculoskeletal: Positive for myalgias. Negative for back pain, falls, joint pain and neck pain.  Skin: Negative for itching  and rash.  Neurological: Negative for dizziness and headaches.    Past Medical History:  Diagnosis Date  . Arthritis    bilateral knees  . CHF (congestive heart failure) ([place])   . Diabetes mellitus without complication ([place])   . Edema   . Hyperlipidemia   . Hypertension   . Hypothyroidism   . Pulmonary embolism ([place])   . Urinary incontinence    Past Surgical History:  Procedure Laterality Date  . BLADDER SURGERY  2000   Dr.Tannerbaum (Bladder Tact)  . CATARACT EXTRACTION  03/2013   Dr.Shapiro  . CHOLECYSTECTOMY    . EYE SURGERY     bilateral cataracts  . [facility]   . right elbow  1999   x 2, still has one piece of metal in it  . TONSILLECTOMY AND ADENOIDECTOMY  1937   Dr.Brewer   Social History:   reports that she has never smoked. She has never used smokeless tobacco. She reports that she does not drink alcohol or use drugs.  Family History  Problem Relation Age of Onset  . Suicidality Father   . Cancer Brother   . Cancer Maternal Grandfather   . Diabetes Maternal Grandmother   . Melanoma Brother     Medications: Patient's Medications  New Prescriptions   No medications on file  Previous Medications   ALENDRONATE (  FOSAMAX) 70 MG TABLET    Take 1 tablet (70 mg total) by mouth once a week. Take with a full glass of water on an empty stomach.   BD PEN NEEDLE NANO U/F 32G X 4 MM MISC    USE AS DIRECTED ONCE DAILY   BLOOD GLUCOSE MONITORING SUPPL (ONE TOUCH ULTRA 2) W/DEVICE KIT    1 Device by Does not apply route daily. Dx: E11.22   CALCIUM CARBONATE-VITAMIN D (CALTRATE 600+D) 600-400 MG-UNIT TABLET    Take 1 tablet by mouth 2 (two) times daily.   CHOLECALCIFEROL (VITAMIN D3) 5000 UNITS CHEW    Chew 1 tablet by mouth daily.   DOCUSATE SODIUM (COLACE) 100 MG CAPSULE    Take 1 capsule (100 mg total) by mouth 2 (two) times daily.   DONEPEZIL (ARICEPT) 10 MG TABLET    TAKE ONE TABLET BY MOUTH ONCE DAILY AT BEDTIME TO PRESERVE  MEMORY   ELIQUIS 5 MG TABS TABLET    TAKE 1 TABLET BY MOUTH TWICE A DAY   FEEDING SUPPLEMENT, ENSURE COMPLETE, (ENSURE COMPLETE) LIQD    Take 237 mLs by mouth 2 (two) times daily between meals as needed (As requested by patient).   FUROSEMIDE (LASIX) 20 MG TABLET    Take 0.5 tablets (10 mg total) by mouth daily.   INSULIN DETEMIR (LEVEMIR FLEXTOUCH) 100 UNIT/ML PEN    Inject 22 Units into the skin daily.   LEVOTHYROXINE (SYNTHROID, LEVOTHROID) 100 MCG TABLET    TAKE 1 TABLET BY MOUTH EVERY DAY BEFORE BREAKFAST   LISINOPRIL (PRINIVIL,ZESTRIL) 10 MG TABLET    TAKE 1 TABLET (10 MG TOTAL) BY MOUTH 2 (TWO) TIMES DAILY   MULTIPLE VITAMIN (MULTIVITAMIN WITH MINERALS) TABS TABLET    Take 1 tablet by mouth daily.   ONE TOUCH ULTRA TEST TEST STRIP    Use every other day to test blood sugar   ONETOUCH DELICA LANCETS 25E MISC    Use every other day to test blood sugar. Dx: E11.22   ROSUVASTATIN (CRESTOR) 20 MG TABLET    Take one tablet by mouth once daily for cholesterol   TRADJENTA 5 MG TABS TABLET    TAKE 1 TABLET BY MOUTH EVERY DAY  Modified Medications   No medications on file  Discontinued Medications   No medications on file     Physical Exam:  Vitals:   02/05/18 0958  BP: 128/82  Pulse: 94  Temp: 98.4 F (36.9 C)  TempSrc: Oral  SpO2: 97%  Weight: 224 lb (101.6 kg)  Height: '5\' 8"'$  (1.727 m)   Body mass index is 34.06 kg/m.  Physical Exam  Constitutional: She is oriented to person, place, and time. She appears well-developed and well-nourished.  HENT:  Head: Normocephalic and atraumatic.  Eyes: Pupils are equal, round, and reactive to light. EOM are normal.  Neck: Normal range of motion. Neck supple.  Cardiovascular: Normal rate, regular rhythm and intact distal pulses. Exam reveals no gallop and no friction rub.  Murmur heard. Pulmonary/Chest: Effort normal and breath sounds normal.  Abdominal: Soft. Bowel sounds are normal. There is no tenderness.  Musculoskeletal: Normal  range of motion. She exhibits no edema or tenderness.       Left hip: Normal. She exhibits normal range of motion, normal strength and no tenderness.       Back:       Legs: Walks with walker  Tenderness to lateral thighs bilaterally and paraspinal muscles to lumbar spine bilaterally.   Neurological: She is  alert and oriented to person, place, and time.  Skin: Skin is warm and dry. No rash noted.  Multiple small hemangioma to the skin Followed by dermatology    Psychiatric: She has a normal mood and affect. Her behavior is normal. Thought content normal.    Labs reviewed: Basic Metabolic Panel: Recent Labs    02/20/17 1124  03/23/17 0909 06/23/17 0925 09/28/17 0917  NA  --    < > 142 142 142  K  --    < > 4.3 4.4 4.5  CL  --    < > 105 104 105  CO2  --    < > '26 31 31  '$ GLUCOSE  --    < > 136* 126* 131*  BUN  --    < > 25 21 27*  CREATININE  --    < > 1.47* 1.33* 1.46*  CALCIUM  --    < > 10.4 10.5* 10.4  TSH 0.91  --   --   --   --    < > = values in this interval not displayed.   Liver Function Tests: Recent Labs    03/23/17 0909 06/23/17 0925 09/28/17 0917  AST '12 12 12  '$ ALT '10 8 9  '$ ALKPHOS 54  --   --   BILITOT 0.6 0.5 0.6  PROT 6.3 6.6 6.3  ALBUMIN 3.7  --   --    No results for input(s): LIPASE, AMYLASE in the last 8760 hours. No results for input(s): AMMONIA in the last 8760 hours. CBC: Recent Labs    02/06/17 0953  WBC 9.0  HGB 13.4  HCT 42.2  MCV 90.0  PLT 173   Lipid Panel: Recent Labs    02/06/17 0953 03/23/17 0909  CHOL 141 162  HDL 54 60  LDLCALC 49 72  TRIG 189* 152*  CHOLHDL 2.6 2.7   TSH: Recent Labs    02/20/17 1124  TSH 0.91   A1C: Lab Results  Component Value Date   HGBA1C 6.4 (H) 09/28/2017     Assessment/Plan 1. Uncontrolled type 2 diabetes mellitus with stage 4 chronic kidney disease, with long-term current use of insulin (HCC) -will get A1c today, encouraged to take blood sugar daily and record, has not been  taking blood sugar. To continue dietary modifications with levemir and tradjenta, may need increase in levemir based on A1c has not taken blood sugars at home recently. - Blood Glucose Monitoring Suppl (ONE TOUCH ULTRA 2) w/Device KIT; 1 Device by Does not apply route daily. Dx: E11.22  Dispense: 1 each; Refill: 0 - Hemoglobin A1c - CMP with eGFR([facility])  2. CKD (chronic kidney disease) stage 4, GFR 15-29 ml/min (HCC) Encourage proper hydration and to avoid NSAIDS (Aleve, Advil, Motrin, Ibuprofen)   3. Left leg pain Left Trochanter Bursitis vs sciatica of lumbar spine.  - predniSONE (DELTASONE) 20 MG tablet; Take 1 tablet (20 mg total) by mouth 2 (two) times daily with a meal.  Dispense: 6 tablet; Refill: 0 May use tylenol 650 mg by mouth every 6 hours as needed Encouraged to call office if symptoms do not improve or worsen.   Next appt: 02/23/2018, sooner if needed  Carlos American. [place], [facility] [phone]

## 2018-02-05 NOTE — Patient Instructions (Addendum)
Start prednisone 20 mg by mouth twice daily x 3 days.  To use tylenol 325 mg by mouth 2 tablets every 6 hours as needed for pain.   Take blood sugars daily and bring to your office visit.

## 2018-02-06 ENCOUNTER — Telehealth: Payer: Self-pay

## 2018-02-06 LAB — COMPLETE METABOLIC PANEL WITH GFR
AG RATIO: 1.5 (calc) (ref 1.0–2.5)
ALBUMIN MSPROF: 4.1 g/dL (ref 3.6–5.1)
ALT: 14 U/L (ref 6–29)
AST: 18 U/L (ref 10–35)
Alkaline phosphatase (APISO): 52 U/L (ref 33–130)
BUN / CREAT RATIO: 23 (calc) — AB (ref 6–22)
BUN: 29 mg/dL — ABNORMAL HIGH (ref 7–25)
CALCIUM: 10.5 mg/dL — AB (ref 8.6–10.4)
CO2: 32 mmol/L (ref 20–32)
Chloride: 105 mmol/L (ref 98–110)
Creat: 1.26 mg/dL — ABNORMAL HIGH (ref 0.60–0.88)
GFR, EST NON AFRICAN AMERICAN: 38 mL/min/{1.73_m2} — AB (ref >=60)
GFR, Est African American: 44 mL/min/{1.73_m2} — ABNORMAL LOW (ref >=60)
GLOBULIN: 2.7 g/dL (ref 1.9–3.7)
Glucose, Bld: 129 mg/dL (ref 65–139)
POTASSIUM: 4.5 mmol/L (ref 3.5–5.3)
SODIUM: 141 mmol/L (ref 135–146)
Total Bilirubin: 0.5 mg/dL (ref 0.2–1.2)
Total Protein: 6.8 g/dL (ref 6.1–8.1)

## 2018-02-06 LAB — HEMOGLOBIN A1C
EAG (MMOL/L): 8.5 (calc)
Hgb A1c MFr Bld: 7 % of total Hgb — ABNORMAL HIGH (ref <5.7)
Mean Plasma Glucose: 154 (calc)

## 2018-02-06 NOTE — Telephone Encounter (Signed)
-----   Message from Lauree Chandler, NP sent at 02/06/2018  9:36 AM EDT ----- A1c has worsening since levemir was reduced to 22 units. Lets have her increase levemir to 24 units, continue to take blood sugars and bring log to next OV with Dr Eulas Post.

## 2018-02-06 NOTE — Telephone Encounter (Signed)
Called and discussed with the patient. No further questions. Med increase updated.

## 2018-02-11 ENCOUNTER — Other Ambulatory Visit: Payer: Self-pay | Admitting: Nurse Practitioner

## 2018-02-23 ENCOUNTER — Ambulatory Visit (INDEPENDENT_AMBULATORY_CARE_PROVIDER_SITE_OTHER): Payer: Medicare Other

## 2018-02-23 ENCOUNTER — Ambulatory Visit (INDEPENDENT_AMBULATORY_CARE_PROVIDER_SITE_OTHER): Payer: Medicare Other | Admitting: Internal Medicine

## 2018-02-23 ENCOUNTER — Encounter: Payer: Self-pay | Admitting: Internal Medicine

## 2018-02-23 VITALS — BP 144/82 | HR 83 | Temp 98.0°F | Ht 68.0 in | Wt 222.0 lb

## 2018-02-23 VITALS — BP 144/78 | HR 83 | Temp 98.0°F | Ht 68.0 in | Wt 222.0 lb

## 2018-02-23 DIAGNOSIS — R413 Other amnesia: Secondary | ICD-10-CM | POA: Diagnosis not present

## 2018-02-23 DIAGNOSIS — E1169 Type 2 diabetes mellitus with other specified complication: Secondary | ICD-10-CM | POA: Diagnosis not present

## 2018-02-23 DIAGNOSIS — Z794 Long term (current) use of insulin: Secondary | ICD-10-CM

## 2018-02-23 DIAGNOSIS — Z79899 Other long term (current) drug therapy: Secondary | ICD-10-CM

## 2018-02-23 DIAGNOSIS — I10 Essential (primary) hypertension: Secondary | ICD-10-CM

## 2018-02-23 DIAGNOSIS — E1159 Type 2 diabetes mellitus with other circulatory complications: Secondary | ICD-10-CM

## 2018-02-23 DIAGNOSIS — Z86711 Personal history of pulmonary embolism: Secondary | ICD-10-CM | POA: Diagnosis not present

## 2018-02-23 DIAGNOSIS — E785 Hyperlipidemia, unspecified: Secondary | ICD-10-CM | POA: Diagnosis not present

## 2018-02-23 DIAGNOSIS — E1122 Type 2 diabetes mellitus with diabetic chronic kidney disease: Secondary | ICD-10-CM | POA: Diagnosis not present

## 2018-02-23 DIAGNOSIS — N183 Chronic kidney disease, stage 3 unspecified: Secondary | ICD-10-CM

## 2018-02-23 DIAGNOSIS — Z0000 Encounter for general adult medical examination without abnormal findings: Secondary | ICD-10-CM | POA: Diagnosis not present

## 2018-02-23 DIAGNOSIS — E034 Atrophy of thyroid (acquired): Secondary | ICD-10-CM

## 2018-02-23 LAB — LIPID PANEL
CHOL/HDL RATIO: 2.6 (calc) (ref <5.0)
CHOLESTEROL: 166 mg/dL (ref <200)
HDL: 64 mg/dL (ref >50)
LDL CHOLESTEROL (CALC): 77 mg/dL
Non-HDL Cholesterol (Calc): 102 mg/dL (calc) (ref <130)
TRIGLYCERIDES: 151 mg/dL — AB (ref <150)

## 2018-02-23 LAB — T4, FREE: FREE T4: 1.1 ng/dL (ref 0.8–1.8)

## 2018-02-23 LAB — TSH: TSH: 0.63 m[IU]/L (ref 0.40–4.50)

## 2018-02-23 NOTE — Patient Instructions (Signed)
Melissa Shannon , Thank you for taking time to come for your Medicare Wellness Visit. I appreciate your ongoing commitment to your health goals. Please review the following plan we discussed and let me know if I can assist you in the future.   Screening recommendations/referrals: Colonoscopy excluded, over age 82 Mammogram excluded, over age 29 Bone Density up to date Recommended yearly ophthalmology/optometry visit for glaucoma screening and checkup Recommended yearly dental visit for hygiene and checkup  Vaccinations: Influenza vaccine due, declined Pneumococcal vaccine due, declined Tdap vaccine up to date, due 04/07/2019 Shingles vaccine due, declined    Advanced directives: Please bring [place] a copy of your living will and health care power of attorney  Conditions/risks identified: none  Next appointment: Melissa Dense, RN 02/25/2019 @ 10:45am   Preventive Care 65 Years and Older, Female Preventive care refers to lifestyle choices and visits with your health care provider that can promote health and wellness. What does preventive care include?  A yearly physical exam. This is also called an annual well check.  Dental exams once or twice a year.  Routine eye exams. Ask your health care provider how often you should have your eyes checked.  Personal lifestyle choices, including:  Daily care of your teeth and gums.  Regular physical activity.  Eating a healthy diet.  Avoiding tobacco and drug use.  Limiting alcohol use.  Practicing safe sex.  Taking low-dose aspirin every day.  Taking vitamin and mineral supplements as recommended by your health care provider. What happens during an annual well check? The services and screenings done by your health care provider during your annual well check will depend on your age, overall health, lifestyle risk factors, and family history of disease. Counseling  Your health care provider may ask you questions about your:  Alcohol  use.  Tobacco use.  Drug use.  Emotional well-being.  Home and relationship well-being.  Sexual activity.  Eating habits.  History of falls.  Memory and ability to understand (cognition).  Work and work Statistician.  Reproductive health. Screening  You may have the following tests or measurements:  Height, weight, and BMI.  Blood pressure.  Lipid and cholesterol levels. These may be checked every 5 years, or more frequently if you are over 63 years old.  Skin check.  Lung cancer screening. You may have this screening every year starting at age 30 if you have a 30-pack-year history of smoking and currently smoke or have quit within the past 15 years.  Fecal occult blood test (FOBT) of the stool. You may have this test every year starting at age 68.  Flexible sigmoidoscopy or colonoscopy. You may have a sigmoidoscopy every 5 years or a colonoscopy every 10 years starting at age 44.  Hepatitis C blood test.  Hepatitis B blood test.  Sexually transmitted disease (STD) testing.  Diabetes screening. This is done by checking your blood sugar (glucose) after you have not eaten for a while (fasting). You may have this done every 1-3 years.  Bone density scan. This is done to screen for osteoporosis. You may have this done starting at age 5.  Mammogram. This may be done every 1-2 years. Talk to your health care provider about how often you should have regular mammograms. Talk with your health care provider about your test results, treatment options, and if necessary, the need for more tests. Vaccines  Your health care provider may recommend certain vaccines, such as:  Influenza vaccine. This is recommended every year.  Tetanus, diphtheria, and acellular pertussis (Tdap, Td) vaccine. You may need a Td booster every 10 years.  Zoster vaccine. You may need this after age 11.  Pneumococcal 13-valent conjugate (PCV13) vaccine. One dose is recommended after age  6.  Pneumococcal polysaccharide (PPSV23) vaccine. One dose is recommended after age 69. Talk to your health care provider about which screenings and vaccines you need and how often you need them. This information is not intended to replace advice given to you by your health care provider. Make sure you discuss any questions you have with your health care provider. Document Released: 08/28/2015 Document Revised: 04/20/2016 Document Reviewed: 06/02/2015 Elsevier Interactive Patient Education  2017 [place] Prevention in the Home Falls can cause injuries. They can happen to people of all ages. There are many things you can do to make your home safe and to help prevent falls. What can I do on the outside of my home?  Regularly fix the edges of walkways and driveways and fix any cracks.  Remove anything that might make you trip as you walk through a door, such as a raised step or threshold.  Trim any bushes or trees on the path to your home.  Use bright outdoor lighting.  Clear any walking paths of anything that might make someone trip, such as rocks or tools.  Regularly check to see if handrails are loose or broken. Make sure that both sides of any steps have handrails.  Any raised decks and porches should have guardrails on the edges.  Have any leaves, snow, or ice cleared regularly.  Use sand or salt on walking paths during winter.  Clean up any spills in your garage right away. This includes oil or grease spills. What can I do in the bathroom?  Use night lights.  Install grab bars by the toilet and in the tub and shower. Do not use towel bars as grab bars.  Use non-skid mats or decals in the tub or shower.  If you need to sit down in the shower, use a plastic, non-slip stool.  Keep the floor dry. Clean up any water that spills on the floor as soon as it happens.  Remove soap buildup in the tub or shower regularly.  Attach bath mats securely with double-sided  non-slip rug tape.  Do not have throw rugs and other things on the floor that can make you trip. What can I do in the bedroom?  Use night lights.  Make sure that you have a light by your bed that is easy to reach.  Do not use any sheets or blankets that are too big for your bed. They should not hang down onto the floor.  Have a firm chair that has side arms. You can use this for support while you get dressed.  Do not have throw rugs and other things on the floor that can make you trip. What can I do in the kitchen?  Clean up any spills right away.  Avoid walking on wet floors.  Keep items that you use a lot in easy-to-reach places.  If you need to reach something above you, use a strong step stool that has a grab bar.  Keep electrical cords out of the way.  Do not use floor polish or wax that makes floors slippery. If you must use wax, use non-skid floor wax.  Do not have throw rugs and other things on the floor that can make you trip. What can I do  with my stairs?  Do not leave any items on the stairs.  Make sure that there are handrails on both sides of the stairs and use them. Fix handrails that are broken or loose. Make sure that handrails are as long as the stairways.  Check any carpeting to make sure that it is firmly attached to the stairs. Fix any carpet that is loose or worn.  Avoid having throw rugs at the top or bottom of the stairs. If you do have throw rugs, attach them to the floor with carpet tape.  Make sure that you have a light switch at the top of the stairs and the bottom of the stairs. If you do not have them, ask someone to add them for you. What else can I do to help prevent falls?  Wear shoes that:  Do not have high heels.  Have rubber bottoms.  Are comfortable and fit you well.  Are closed at the toe. Do not wear sandals.  If you use a stepladder:  Make sure that it is fully opened. Do not climb a closed stepladder.  Make sure that both  sides of the stepladder are locked into place.  Ask someone to hold it for you, if possible.  Clearly mark and make sure that you can see:  Any grab bars or handrails.  First and last steps.  Where the edge of each step is.  Use tools that help you move around (mobility aids) if they are needed. These include:  Canes.  Walkers.  Scooters.  Crutches.  Turn on the lights when you go into a dark area. Replace any light bulbs as soon as they burn out.  Set up your furniture so you have a clear path. Avoid moving your furniture around.  If any of your floors are uneven, fix them.  If there are any pets around you, be aware of where they are.  Review your medicines with your doctor. Some medicines can make you feel dizzy. This can increase your chance of falling. Ask your doctor what other things that you can do to help prevent falls. This information is not intended to replace advice given to you by your health care provider. Make sure you discuss any questions you have with your health care provider. Document Released: 05/28/2009 Document Revised: 01/07/2016 Document Reviewed: 09/05/2014 Elsevier Interactive Patient Education  2017 [employer].

## 2018-02-23 NOTE — Progress Notes (Signed)
Patient ID: Melissa Shannon, female   DOB: 08-12-1928, 84 y.o.   MRN: [id number]   Location:  [facility] OFFICE  Provider: DR Arletha Grippe  Code Status:  Goals of Care:  Advanced Directives 02/23/2018  Does Patient Have a Medical Advance Directive? Yes  Type of Advance Directive Out of facility DNR (pink MOST or yellow form)  Does patient want to make changes to medical advance directive? -  Copy of [place] in Chart? -  Would patient like information on creating a medical advance directive? -  Pre-existing out of facility DNR order (yellow form or pink MOST form) Yellow form placed in chart (order not valid for inpatient use)     Chief Complaint  Patient presents with  . Medical Management of Chronic Issues    Pt is being seen for a 6 month routine visit.     HPI: Patient is a 84 y.o. female seen today for medical management of chronic diseases.  AWV completed today. MMSE 27/30. She was seen by Janett Billow last month and was rx prednisone taper for left trochanteric bursitis. Her discomfort has resolved. She is sleeping better now. She uses a rolling walker to ambulate. Her legs feel weak. No numbness or tingling  HTN - stable on lasix and lisinopril.   Arthralgias/joint swelling - She has arthritis and has difficulty grasping objects. Increased joint stiffness. Uses walker at home along with cane.   DM - A1c 7%; Checks BS at home every other day, fasting, 120-180s. No low BS reactions. She takes levemir and tradjenta. LDL 72; HDL 60; urine microalbumin/Cr ratio 6. She is on eliquis for hx PE  Hyperlipidemia - stable on crestor. LDL 72  Hypothyroidism - stable on levothyroxine; TSH 0.91  Hx PE/DVT - dx in November 2015. She was followed by Dr Melvyn Novas but has not seen him in > 1 yr. She has not had repeat studies yet (including V/Q scan and CXR, repeat venous doppler) to ensure that DVT and PE resolved.  CKD - stage 3. Cr 1.26  Osteoporosis - stable. Takes fosamax  weekly, Ca 2+ and Vit D daily. DXA in 12/2016 with T score -3.2  Memory loss - stable on aricept. MMSE 27/30   Past Medical History:  Diagnosis Date  . Arthritis    bilateral knees  . CHF (congestive heart failure) ([place])   . Diabetes mellitus without complication ([place])   . Edema   . Hyperlipidemia   . Hypertension   . Hypothyroidism   . Pulmonary embolism ([place])   . Urinary incontinence     Past Surgical History:  Procedure Laterality Date  . BLADDER SURGERY  2000   Dr.Tannerbaum (Bladder Tact)  . CATARACT EXTRACTION  03/2013   Dr.Shapiro  . CHOLECYSTECTOMY    . EYE SURGERY     bilateral cataracts  . [facility]   . right elbow  1999   x 2, still has one piece of metal in it  . TONSILLECTOMY AND ADENOIDECTOMY  1937   Dr.Brewer     reports that she has never smoked. She has never used smokeless tobacco. She reports that she does not drink alcohol or use drugs. Social History   Socioeconomic History  . Marital status: Widowed    Spouse name: Not on file  . Number of children: Not on file  . Years of education: Not on file  . Highest education level: Not on file  Occupational History  .  Occupation: Retired Tour manager  . Financial resource strain: Not hard at all  . Food insecurity:    Worry: Never true    Inability: Never true  . Transportation needs:    Medical: No    Non-medical: No  Tobacco Use  . Smoking status: Never Smoker  . Smokeless tobacco: Never Used  Substance and Sexual Activity  . Alcohol use: No  . Drug use: No  . Sexual activity: Never  Lifestyle  . Physical activity:    Days per week: 7 days    Minutes per session: 20 min  . Stress: Not at all  Relationships  . Social connections:    Talks on phone: Three times a week    Gets together: Once a week    Attends religious service: Never    Active member of club or organization: No    Attends meetings of clubs or organizations: Never     Relationship status: Widowed  . Intimate partner violence:    Fear of current or ex partner: No    Emotionally abused: No    Physically abused: No    Forced sexual activity: No  Other Topics Concern  . Not on file  Social History Narrative   Diet: Low sodium    Do you drink/eat things with caffeine? Yes, tea   Widowed, married 1952   Lives in a house, yes- 1 or more stories, 1 person in home. No pets    Current/past profession- Teacher    Patient exercises with walker twice daily         Family History  Problem Relation Age of Onset  . Suicidality Father   . Cancer Brother   . Cancer Maternal Grandfather   . Diabetes Maternal Grandmother   . Melanoma Brother     Allergies  Allergen Reactions  . Penicillins Other (See Comments)    Throat felt tight    Outpatient Encounter Medications as of 02/23/2018  Medication Sig  . alendronate (FOSAMAX) 70 MG tablet Take 1 tablet (70 mg total) by mouth once a week. Take with a full glass of water on an empty stomach.  . BD PEN NEEDLE NANO U/F 32G X 4 MM MISC USE AS DIRECTED ONCE DAILY  . Blood Glucose Monitoring Suppl (ONE TOUCH ULTRA 2) w/Device KIT 1 Device by Does not apply route daily. Dx: E11.22  . Calcium Carbonate-Vitamin D (CALTRATE 600+D) 600-400 MG-UNIT tablet Take 1 tablet by mouth 2 (two) times daily.  . Cholecalciferol (VITAMIN D3) 5000 UNITS CHEW Chew 1 tablet by mouth daily.  Marland Kitchen docusate sodium (COLACE) 100 MG capsule Take 1 capsule (100 mg total) by mouth 2 (two) times daily.  Marland Kitchen donepezil (ARICEPT) 10 MG tablet TAKE ONE TABLET BY MOUTH ONCE DAILY AT BEDTIME TO PRESERVE MEMORY  . ELIQUIS 5 MG TABS tablet TAKE 1 TABLET BY MOUTH TWICE A DAY  . feeding supplement, ENSURE COMPLETE, (ENSURE COMPLETE) LIQD Take 237 mLs by mouth 2 (two) times daily between meals as needed (As requested by patient).  . furosemide (LASIX) 20 MG tablet Take 0.5 tablets (10 mg total) by mouth daily.  . Insulin Detemir (LEVEMIR FLEXTOUCH) 100 UNIT/ML  Pen Inject 24 Units into the skin daily at 10 pm.  . levothyroxine (SYNTHROID, LEVOTHROID) 100 MCG tablet TAKE 1 TABLET BY MOUTH EVERY DAY BEFORE BREAKFAST  . lisinopril (PRINIVIL,ZESTRIL) 10 MG tablet TAKE 1 TABLET (10 MG TOTAL) BY MOUTH 2 (TWO) TIMES DAILY  . Multiple Vitamin (MULTIVITAMIN WITH MINERALS)  TABS tablet Take 1 tablet by mouth daily.  . ONE TOUCH ULTRA TEST test strip Use every other day to test blood sugar  . ONETOUCH DELICA LANCETS 12X MISC Use every other day to test blood sugar. Dx: E11.22  . predniSONE (DELTASONE) 20 MG tablet Take 1 tablet (20 mg total) by mouth 2 (two) times daily with a meal.  . rosuvastatin (CRESTOR) 20 MG tablet Take one tablet by mouth once daily for cholesterol  . TRADJENTA 5 MG TABS tablet TAKE 1 TABLET BY MOUTH EVERY DAY   No facility-administered encounter medications on file as of 02/23/2018.     Review of Systems:  Review of Systems  Unable to perform ROS: Dementia    Health Maintenance  Topic Date Due  . PNA vac Low Risk Adult (2 of 2 - PCV13) 03/14/2018 (Originally 04/06/2010)  . INFLUENZA VACCINE  04/23/2018 (Originally 03/15/2018)  . OPHTHALMOLOGY EXAM  06/28/2018  . HEMOGLOBIN A1C  08/07/2018  . FOOT EXAM  02/06/2019  . TETANUS/TDAP  04/07/2019  . DEXA SCAN  Completed    Physical Exam: Vitals:   02/23/18 1023  BP: (!) 144/82  Pulse: 83  Temp: 98 F (36.7 C)  TempSrc: Oral  SpO2: 94%  Weight: 222 lb (100.7 kg)  Height: '5\' 8"'$  (1.727 m)   Body mass index is 33.75 kg/m. Physical Exam  Constitutional: She appears well-developed and well-nourished.  HENT:  Mouth/Throat: Oropharynx is clear and moist. No oropharyngeal exudate.  MMM; no oral thrush  Eyes: Pupils are equal, round, and reactive to light. No scleral icterus.  Neck: Neck supple. Carotid bruit is not present. No tracheal deviation present. No thyromegaly present.  Cardiovascular: Normal rate, regular rhythm and intact distal pulses. Exam reveals no gallop and no  friction rub.  Murmur (1/6 SEM) heard. No LE edema b/l. no calf TTP.   Pulmonary/Chest: Effort normal and breath sounds normal. No stridor. No respiratory distress. She has no wheezes. She has no rales.  Abdominal: Soft. Normal appearance and bowel sounds are normal. She exhibits no distension and no mass. There is no hepatomegaly. There is no tenderness. There is no rigidity, no rebound and no guarding. No hernia.  obese  Musculoskeletal: She exhibits edema.  Lymphadenopathy:    She has no cervical adenopathy.  Neurological: She is alert. She has normal reflexes. Gait (unsteady) abnormal.  B/l LE weakness  Skin: Skin is warm and dry. No rash noted.  Psychiatric: She has a normal mood and affect. Her behavior is normal. Judgment and thought content normal.    Labs reviewed: Basic Metabolic Panel: Recent Labs    06/23/17 0925 09/28/17 0917 02/05/18 1049  NA 142 142 141  K 4.4 4.5 4.5  CL 104 105 105  CO2 31 31 32  GLUCOSE 126* 131* 129  BUN 21 27* 29*  CREATININE 1.33* 1.46* 1.26*  CALCIUM 10.5* 10.4 10.5*   Liver Function Tests: Recent Labs    03/23/17 0909 06/23/17 0925 09/28/17 0917 02/05/18 1049  AST '12 12 12 18  '$ ALT '10 8 9 14  '$ ALKPHOS 54  --   --   --   BILITOT 0.6 0.5 0.6 0.5  PROT 6.3 6.6 6.3 6.8  ALBUMIN 3.7  --   --   --    No results for input(s): LIPASE, AMYLASE in the last 8760 hours. No results for input(s): AMMONIA in the last 8760 hours. CBC: No results for input(s): WBC, NEUTROABS, HGB, HCT, MCV, PLT in the last 8760 hours.  Lipid Panel: Recent Labs    03/23/17 0909  CHOL 162  HDL 60  LDLCALC 72  TRIG 152*  CHOLHDL 2.7   Lab Results  Component Value Date   HGBA1C 7.0 (H) 02/05/2018    Procedures since last visit: No results found.  Assessment/Plan   ICD-10-CM   1. Hyperlipidemia associated with type 2 diabetes mellitus (HCC) E11.69 Lipid Panel   E78.5   2. Type 2 diabetes mellitus with stage 3 chronic kidney disease, with long-term  current use of insulin (HCC) E11.22    N18.3    Z79.4   3. Hypertension associated with diabetes ([place]) E11.59    I10   4. History of pulmonary embolus (PE) Z86.711   5. Memory loss R41.3   6. High risk medication use Z79.899   7. Hypothyroidism due to acquired atrophy of thyroid E03.4 TSH    T4, Free    Recommend you increase exercise as tolerated - strengthen leg muscles  Continue current medications as ordered  Will call with lab results  FOLLOW UP WITH DR [place] PE/DVT  Follow up with Janett Billow in 3 mos for DM, HTN, hyperlipidemia, hx PE/DVT, arthralgias.   Melissa Shannon  [facility] [address] [place], [place] [address] [phone] Cell (Monday-Friday 8 AM - 5 PM) [phone] After 5 PM and follow prompts

## 2018-02-23 NOTE — Progress Notes (Addendum)
Subjective:   Melissa Shannon is a 84 y.o. female who presents for Medicare Annual (Subsequent) preventive examination.  Last AWV-02/11/2016    Objective:     Vitals: BP (!) 144/78 (BP Location: Left Arm, Patient Position: Sitting)   Pulse 83   Temp 98 F (36.7 C) (Oral)   Ht '5\' 8"'$  (1.727 m)   Wt 222 lb (100.7 kg)   SpO2 94%   BMI 33.75 kg/m   Body mass index is 33.75 kg/m.  Advanced Directives 02/23/2018 02/23/2018 02/05/2018 08/21/2017 02/20/2017 02/20/2017 02/17/2017  Does Patient Have a Medical Advance Directive? Yes Yes Yes Yes Yes No No  Type of Advance Directive Out of facility DNR (pink MOST or yellow form) [place];Living will Out of facility DNR (pink MOST or yellow form) Out of facility DNR (pink MOST or yellow form) Out of facility DNR (pink MOST or yellow form) - -  Does patient want to make changes to medical advance directive? - No - Patient declined - No - Patient declined - Yes (MAU/Ambulatory/Procedural Areas - Information given) Yes (MAU/Ambulatory/Procedural Areas - Information given)  Copy of Healthcare Power of Attorney in Chart? - No - copy requested - - - - No - copy requested  Would patient like information on creating a medical advance directive? - - - - - - -  Pre-existing out of facility DNR order (yellow form or pink MOST form) Yellow form placed in chart (order not valid for inpatient use) Yellow form placed in chart (order not valid for inpatient use) Yellow form placed in chart (order not valid for inpatient use) Yellow form placed in chart (order not valid for inpatient use) Yellow form placed in chart (order not valid for inpatient use) - -    Tobacco Social History   Tobacco Use  Smoking Status Never Smoker  Smokeless Tobacco Never Used     Counseling given: Not Answered   Clinical Intake:  Pre-visit preparation completed: No  Pain : No/denies pain     Nutritional Risks: None Diabetes: Yes CBG done?: No Did pt. bring in  CBG monitor from home?: No  How often do you need to have someone help you when you read instructions, pamphlets, or other written materials from your doctor or pharmacy?: 1 - Never What is the last grade level you completed in school?: College  Interpreter Needed?: No  Information entered by :: [name], RN  Past Medical History:  Diagnosis Date  . Arthritis    bilateral knees  . CHF (congestive heart failure) ([place])   . Diabetes mellitus without complication ([place])   . Edema   . Hyperlipidemia   . Hypertension   . Hypothyroidism   . Pulmonary embolism ([place])   . Urinary incontinence    Past Surgical History:  Procedure Laterality Date  . BLADDER SURGERY  2000   Dr.Tannerbaum (Bladder Tact)  . CATARACT EXTRACTION  03/2013   Dr.Shapiro  . CHOLECYSTECTOMY    . EYE SURGERY     bilateral cataracts  . [facility]   . right elbow  1999   x 2, still has one piece of metal in it  . TONSILLECTOMY AND ADENOIDECTOMY  1937   Dr.Brewer   Family History  Problem Relation Age of Onset  . Suicidality Father   . Cancer Brother   . Cancer Maternal Grandfather   . Diabetes Maternal Grandmother   . Melanoma Brother    Social History  Socioeconomic History  . Marital status: Widowed    Spouse name: Not on file  . Number of children: Not on file  . Years of education: Not on file  . Highest education level: Not on file  Occupational History  . Occupation: Retired Tour manager  . Financial resource strain: Not hard at all  . Food insecurity:    Worry: Never true    Inability: Never true  . Transportation needs:    Medical: No    Non-medical: No  Tobacco Use  . Smoking status: Never Smoker  . Smokeless tobacco: Never Used  Substance and Sexual Activity  . Alcohol use: No  . Drug use: No  . Sexual activity: Never  Lifestyle  . Physical activity:    Days per week: 7 days    Minutes per session: 20 min  . Stress: Not at  all  Relationships  . Social connections:    Talks on phone: Three times a week    Gets together: Once a week    Attends religious service: Never    Active member of club or organization: No    Attends meetings of clubs or organizations: Never    Relationship status: Widowed  Other Topics Concern  . Not on file  Social History Narrative   Diet: Low sodium    Do you drink/eat things with caffeine? Yes, tea   Widowed, married 1952   Lives in a house, yes- 1 or more stories, 1 person in home. No pets    Current/past profession- Teacher    Patient exercises with walker twice daily         Outpatient Encounter Medications as of 02/23/2018  Medication Sig  . alendronate (FOSAMAX) 70 MG tablet Take 1 tablet (70 mg total) by mouth once a week. Take with a full glass of water on an empty stomach.  . BD PEN NEEDLE NANO U/F 32G X 4 MM MISC USE AS DIRECTED ONCE DAILY  . Blood Glucose Monitoring Suppl (ONE TOUCH ULTRA 2) w/Device KIT 1 Device by Does not apply route daily. Dx: E11.22  . Calcium Carbonate-Vitamin D (CALTRATE 600+D) 600-400 MG-UNIT tablet Take 1 tablet by mouth 2 (two) times daily.  . Cholecalciferol (VITAMIN D3) 5000 UNITS CHEW Chew 1 tablet by mouth daily.  Marland Kitchen docusate sodium (COLACE) 100 MG capsule Take 1 capsule (100 mg total) by mouth 2 (two) times daily.  Marland Kitchen donepezil (ARICEPT) 10 MG tablet TAKE ONE TABLET BY MOUTH ONCE DAILY AT BEDTIME TO PRESERVE MEMORY  . ELIQUIS 5 MG TABS tablet TAKE 1 TABLET BY MOUTH TWICE A DAY  . feeding supplement, ENSURE COMPLETE, (ENSURE COMPLETE) LIQD Take 237 mLs by mouth 2 (two) times daily between meals as needed (As requested by patient).  . furosemide (LASIX) 20 MG tablet Take 0.5 tablets (10 mg total) by mouth daily.  . Insulin Detemir (LEVEMIR FLEXTOUCH) 100 UNIT/ML Pen Inject 24 Units into the skin daily at 10 pm.  . levothyroxine (SYNTHROID, LEVOTHROID) 100 MCG tablet TAKE 1 TABLET BY MOUTH EVERY DAY BEFORE BREAKFAST  . lisinopril  (PRINIVIL,ZESTRIL) 10 MG tablet TAKE 1 TABLET (10 MG TOTAL) BY MOUTH 2 (TWO) TIMES DAILY  . Multiple Vitamin (MULTIVITAMIN WITH MINERALS) TABS tablet Take 1 tablet by mouth daily.  . ONE TOUCH ULTRA TEST test strip Use every other day to test blood sugar  . ONETOUCH DELICA LANCETS 67Y MISC Use every other day to test blood sugar. Dx: E11.22  . rosuvastatin (CRESTOR) 20 MG  tablet Take one tablet by mouth once daily for cholesterol  . TRADJENTA 5 MG TABS tablet TAKE 1 TABLET BY MOUTH EVERY DAY  . predniSONE (DELTASONE) 20 MG tablet Take 1 tablet (20 mg total) by mouth 2 (two) times daily with a meal.   No facility-administered encounter medications on file as of 02/23/2018.     Activities of Daily Living In your present state of health, do you have any difficulty performing the following activities: 02/23/2018  Hearing? Y  Vision? N  Difficulty concentrating or making decisions? N  Walking or climbing stairs? Y  Dressing or bathing? Y  Doing errands, shopping? N  Preparing Food and eating ? N  Using the Toilet? N  In the past six months, have you accidently leaked urine? Y  Do you have problems with loss of bowel control? N  Managing your Medications? Y  Managing your Finances? Y  Housekeeping or managing your Housekeeping? Y  Some recent data might be hidden    Patient Care Team: Lauree Chandler, NP as PCP - General (Nurse Practitioner) Tanda Rockers, MD as Consulting Physician (Pulmonary Disease) Larey Dresser, MD as Consulting Physician (Cardiology)    Assessment:   This is a routine wellness examination for [place].  Exercise Activities and Dietary recommendations Current Exercise Habits: Home exercise routine, Type of exercise: walking, Time (Minutes): 20, Frequency (Times/Week): 7, Weekly Exercise (Minutes/Week): 140, Intensity: Mild, Exercise limited by: None identified  Goals    None      Fall Risk Fall Risk  02/23/2018 02/23/2018 02/05/2018 08/21/2017 02/20/2017    Falls in the past year? No No No No No   Is the patient's home free of loose throw rugs in walkways, pet beds, electrical cords, etc?   yes      Grab bars in the bathroom? yes      Handrails on the stairs?   yes      Adequate lighting?   yes  Depression Screen PHQ 2/9 Scores 02/23/2018 02/23/2018 02/20/2017 02/17/2017  PHQ - 2 Score 0 0 0 0     Cognitive Function MMSE - Mini Mental State Exam 02/23/2018 02/17/2017 02/11/2016 09/04/2014  Orientation to time '5 5 5 5  '$ Orientation to Place '3 4 5 5  '$ Registration '3 3 3 3  '$ Attention/ Calculation '5 5 5 5  '$ Recall '2 1 2 3  '$ Language- name 2 objects '2 2 2 2  '$ Language- repeat '1 1 1 1  '$ Language- follow 3 step command '3 2 3 3  '$ Language- read & follow direction '1 1 1 1  '$ Write a sentence '1 1 1 1  '$ Copy design '1 1 1 1  '$ Total score '27 26 29 30        '$ Immunization History  Administered Date(s) Administered  . Pneumococcal Polysaccharide-23 04/06/2009  . Td 04/06/2009    Qualifies for Shingles Vaccine? Yes, educated and declined  Screening Tests Health Maintenance  Topic Date Due  . PNA vac Low Risk Adult (2 of 2 - PCV13) 03/14/2018 (Originally 04/06/2010)  . INFLUENZA VACCINE  04/23/2018 (Originally 03/15/2018)  . OPHTHALMOLOGY EXAM  06/28/2018  . HEMOGLOBIN A1C  08/07/2018  . FOOT EXAM  02/06/2019  . TETANUS/TDAP  04/07/2019  . DEXA SCAN  Completed    Cancer Screenings: Lung: Low Dose CT Chest recommended if Age 75-80 years, 30 pack-year currently smoking OR have quit w/in 15years. Patient does not qualify. Breast:  Up to date on Mammogram? Yes   Up to date  of Bone Density/Dexa? Yes Colorectal: up to date  Additional Screenings:  Hepatitis C Screening: declined Pna 13 due: declined     Plan:    I have personally reviewed and addressed the Medicare Annual Wellness questionnaire and have noted the following in the patient's chart:  A. Medical and social history B. Use of alcohol, tobacco or illicit drugs  C. Current medications and  supplements D. Functional ability and status E.  Nutritional status F.  Physical activity G. Advance directives H. List of other physicians I.  Hospitalizations, surgeries, and ER visits in previous 12 months J.  [place] to include hearing, vision, cognitive, depression L. Referrals and appointments - none  In addition, I have reviewed and discussed with patient certain preventive protocols, quality metrics, and best practice recommendations. A written personalized care plan for preventive services as well as general preventive health recommendations were provided to patient.  See attached scanned questionnaire for additional information.   Signed,   [name], RN Nurse Health Advisor  Patient Concerns: none

## 2018-02-23 NOTE — Patient Instructions (Addendum)
Recommend you increase exercise as tolerated - strengthen leg muscles  Continue current medications as ordered  Will call with lab results  FOLLOW UP WITH DR [place] PE/DVT  Follow up with Janett Billow in 3 mos for DM, HTN, hyperlipidemia, hx PE/DVT, arthralgias.

## 2018-02-24 ENCOUNTER — Other Ambulatory Visit: Payer: Self-pay | Admitting: Nurse Practitioner

## 2018-02-27 ENCOUNTER — Ambulatory Visit (INDEPENDENT_AMBULATORY_CARE_PROVIDER_SITE_OTHER): Payer: Medicare Other | Admitting: Internal Medicine

## 2018-02-27 ENCOUNTER — Encounter: Payer: Self-pay | Admitting: Internal Medicine

## 2018-02-27 ENCOUNTER — Other Ambulatory Visit (HOSPITAL_COMMUNITY): Payer: Self-pay | Admitting: Cardiology

## 2018-02-27 ENCOUNTER — Other Ambulatory Visit (INDEPENDENT_AMBULATORY_CARE_PROVIDER_SITE_OTHER): Payer: Medicare Other

## 2018-02-27 VITALS — BP 122/68 | HR 115 | Ht 68.0 in | Wt 223.8 lb

## 2018-02-27 DIAGNOSIS — R0609 Other forms of dyspnea: Secondary | ICD-10-CM

## 2018-02-27 DIAGNOSIS — R000 Tachycardia, unspecified: Secondary | ICD-10-CM | POA: Diagnosis not present

## 2018-02-27 DIAGNOSIS — I2782 Chronic pulmonary embolism: Secondary | ICD-10-CM

## 2018-02-27 LAB — CBC WITH DIFFERENTIAL/PLATELET
BASOS ABS: 0.1 10*3/uL (ref 0.0–0.1)
Basophils Relative: 1.1 % (ref 0.0–3.0)
Eosinophils Absolute: 0.2 10*3/uL (ref 0.0–0.7)
Eosinophils Relative: 2.7 % (ref 0.0–5.0)
HEMATOCRIT: 42.7 % (ref 36.0–46.0)
Hemoglobin: 14.2 g/dL (ref 12.0–15.0)
LYMPHS PCT: 26 % (ref 12.0–46.0)
Lymphs Abs: 2.1 10*3/uL (ref 0.7–4.0)
MCHC: 33.2 g/dL (ref 30.0–36.0)
MCV: 88.6 fl (ref 78.0–100.0)
MONOS PCT: 8.2 % (ref 3.0–12.0)
Monocytes Absolute: 0.7 10*3/uL (ref 0.1–1.0)
Neutro Abs: 4.9 10*3/uL (ref 1.4–7.7)
Neutrophils Relative %: 62 % (ref 43.0–77.0)
Platelets: 171 10*3/uL (ref 150.0–400.0)
RBC: 4.82 Mil/uL (ref 3.87–5.11)
RDW: 13.2 % (ref 11.5–15.5)
WBC: 7.9 10*3/uL (ref 4.0–10.5)

## 2018-02-27 LAB — BRAIN NATRIURETIC PEPTIDE: Pro B Natriuretic peptide (BNP): 34 pg/mL (ref 0.0–100.0)

## 2018-02-27 NOTE — Patient Instructions (Addendum)
[place] with me to stop the eliquis but would check with Dr Aundra Dubin first   If you do  Stop the eliquis then decide to restart it due to shortness of breath, resume 5 mg twice daily instead of the one half dose    Please remember to go to the lab department downstairs in the basement  for your tests - we will call you with the results when they are available.      Pulmonary follow up is as needed

## 2018-02-27 NOTE — Assessment & Plan Note (Addendum)
Ecg p resting is nl > checked bnp to be complete = nl as is TSH - f/u with Dr Aundra Dubin planned

## 2018-02-27 NOTE — Progress Notes (Signed)
Subjective:    Patient ID: Melissa Shannon, female    DOB: Apr 01, 1928,    MRN: [id number]    Brief patient profile:  37  yowf never smoker s/p admit  Admit date: 06/11/2014 Discharge date: 06/19/2014   Discharge Diagnoses:   Acute diastolic CHF (congestive heart failure)  Pulmonary HTN, probably PE.   Hypertension  Diabetes mellitus without complication  Hyperlipemia  Congestive heart failure  Malnutrition of moderate degree  Right heart failure due to pulmonary hypertension  Pulmonary hypertension  Multiple pulmonary nodules  Acute respiratory failure with hypoxia  Congestive heart disease    06/30/2014 transition of care/ post hosp  F/u ov/Chamberlain Steinborn re:  Chief Complaint  Patient presents with  . Pulmonary Consult    Referred by Dr. Loralie Champagne for eval of pulmonary nodule. Pt c/o DOE with walking approx 25 ft.   see my inpt note from 06/14/14 with new onset sob/desat assoc with RV strain and intermediate V/Q c/w PE ? Chronicity > she is  Improving back to baseline doe on walker for djd / knees slow her down more than breathing rec F/u CT chest in 3 m > no change c/w mucus plugs    09/22/2014 f/u ov/Aili Casillas re: f/u abn ct chest /presumed PE with PH s/p 3 m anticoagulation  Chief Complaint  Patient presents with  . Follow-up    Pt states her breathing is doing well overall. No new co's today.    pt not enthusiastic for more studies/no cough or limiting sob rec Please remember to go to the  x-ray department downstairs for your tests - we will call you with the results when they are available.       01/05/2015 f/u ov/Hazim Treadway re: s/p pe/ echo has normalized since last ov   Chief Complaint  Patient presents with  . Follow-up    Pt states that her breathing is doing well. No co's today.   Not limited by breathing from desired activities  = walking anywhere she wants with rolling walker rec Please see patient coordinator before you leave today  to schedule VQ  scan and then I will call with recommendatoins on your Eliquis but for now would continue to use the full dose unless there is a new problem (like bleeding or increase fall risk) to push the risk over the benefit ratio in favor of a lower or zero dose    02/27/2018  ov/Lezlie Ritchey re: re establish for PE on eliquis 5 mg one half bid  Chief Complaint  Patient presents with  . Pulmonary Consult    last seen in May 2016-referred back by Dr Eulas Post for eval to see if she can stop Eliquis. She denies any respiratory co's.   Dyspnea:   MMRC2 = can't walk a nl pace on a flat grade s sob but does fine slow and flat with rolling walker   Cough: none   SABA use: none  02:  None    No obvious day to day or daytime variability or assoc excess/ purulent sputum or mucus plugs or hemoptysis or cp or chest tightness, subjective wheeze or overt sinus or hb symptoms.   Sleeping: either side/ one pillow without nocturnal  or early am exacerbation  of respiratory  c/o's or need for noct saba. Also denies any obvious fluctuation of symptoms with weather or environmental changes or other aggravating or alleviating factors except as outlined above   No unusual exposure hx or h/o childhood pna/ asthma or knowledge  of premature birth.  Current Allergies, Complete Past Medical History, Past Surgical History, Family History, and Social History were reviewed in [employer] record.  ROS  The following are not active complaints unless bolded Hoarseness, sore throat, dysphagia, dental problems, itching, sneezing,  nasal congestion or discharge of excess mucus or purulent secretions, ear ache,   fever, chills, sweats, unintended wt loss or wt gain, classically pleuritic or exertional cp,  orthopnea pnd or arm/hand swelling  or leg swelling, presyncope, palpitations, abdominal pain, anorexia, nausea, vomiting, diarrhea  or change in bowel habits or change in bladder habits, change in stools or change in urine,  dysuria, hematuria,  rash, arthralgias, visual complaints, headache, numbness, weakness or ataxia or problems with walking or coordination,  change in mood or  memory.        Current Meds  Medication Sig  . alendronate (FOSAMAX) 70 MG tablet Take 1 tablet (70 mg total) by mouth once a week. Take with a full glass of water on an empty stomach.  . BD PEN NEEDLE NANO U/F 32G X 4 MM MISC USE AS DIRECTED ONCE DAILY  . Blood Glucose Monitoring Suppl (ONE TOUCH ULTRA 2) w/Device KIT 1 Device by Does not apply route daily. Dx: E11.22  . Calcium Carbonate-Vitamin D (CALTRATE 600+D) 600-400 MG-UNIT tablet Take 1 tablet by mouth 2 (two) times daily.  . Cholecalciferol (VITAMIN D3) 5000 UNITS CHEW Chew 1 tablet by mouth daily.  Marland Kitchen docusate sodium (COLACE) 100 MG capsule Take 1 capsule (100 mg total) by mouth 2 (two) times daily.  Marland Kitchen donepezil (ARICEPT) 10 MG tablet TAKE ONE TABLET BY MOUTH ONCE DAILY AT BEDTIME TO PRESERVE MEMORY  . ELIQUIS 5 MG TABS tablet TAKE 1 TABLET BY MOUTH TWICE A DAY  . feeding supplement, ENSURE COMPLETE, (ENSURE COMPLETE) LIQD Take 237 mLs by mouth 2 (two) times daily between meals as needed (As requested by patient).  . furosemide (LASIX) 20 MG tablet Take 0.5 tablets (10 mg total) by mouth daily.  . Insulin Detemir (LEVEMIR FLEXTOUCH) 100 UNIT/ML Pen Inject 24 Units into the skin daily at 10 pm.  . levothyroxine (SYNTHROID, LEVOTHROID) 100 MCG tablet TAKE 1 TABLET BY MOUTH EVERY DAY BEFORE BREAKFAST  . lisinopril (PRINIVIL,ZESTRIL) 10 MG tablet TAKE 1 TABLET (10 MG TOTAL) BY MOUTH 2 (TWO) TIMES DAILY  . Multiple Vitamin (MULTIVITAMIN WITH MINERALS) TABS tablet Take 1 tablet by mouth daily.  . ONE TOUCH ULTRA TEST test strip Use every other day to test blood sugar  . ONETOUCH DELICA LANCETS 16X MISC Use every other day to test blood sugar. Dx: E11.22  . rosuvastatin (CRESTOR) 20 MG tablet Take one tablet by mouth once daily for cholesterol  . TRADJENTA 5 MG TABS tablet TAKE 1  TABLET BY MOUTH EVERY DAY                        Objective:   Physical Exam  Pleasant  amb wf nad walking with rolling walker   09/22/2014          219  > 01/05/2015     224  > 02/27/2018  223     06/30/14 220 lb (99.791 kg)  06/23/14 217 lb (98.431 kg)  06/19/14 219 lb 3.2 oz (99.428 kg)      Vital signs reviewed - Note on arrival 02 sats  95% on RA and pulse 115 on arrival, occasionally  irreg     HEENT: nl dentition, turbinates  bilaterally, and oropharynx. Nl external ear canals without cough reflex   NECK :  without JVD/Nodes/TM/ nl carotid upstrokes bilaterally   LUNGS: no acc muscle use,  Nl contour chest which is clear to A and P bilaterally without cough on insp or exp maneuvers   CV:  slt irreg rhythm  no s3 or murmur or increase in P2, and trace pitting but severe chronic swelling all 4 ext typical of lymphedema   ABD:  soft and nontender with nl inspiratory excursion in the supine position. No bruits or organomegaly appreciated, bowel sounds nl  MS:  Nl gait/ ext warm without deformities, calf tenderness, cyanosis or clubbing No obvious joint restrictions   SKIN: warm and dry without lesions    NEURO:  alert, approp, nl sensorium with  no motor or cerebellar deficits apparent.          ekg 02/27/2018   Nsr, no ectopics or strain patterns    Labs ordered/ reviewed:      Chemistry      Component Value Date/Time   NA 141 02/05/2018 1049   NA 144 02/08/2016 0921   K 4.5 02/05/2018 1049   CL 105 02/05/2018 1049   CO2 32 02/05/2018 1049   BUN 29 (H) 02/05/2018 1049   BUN 29 (H) 02/08/2016 0921   CREATININE 1.26 (H) 02/05/2018 1049      Component Value Date/Time   CALCIUM 10.5 (H) 02/05/2018 1049   ALKPHOS 54 03/23/2017 0909   AST 18 02/05/2018 1049   ALT 14 02/05/2018 1049   BILITOT 0.5 02/05/2018 1049   BILITOT 0.5 02/08/2016 0921     Albumin                      4.1                6.24.19       Lab Results  Component Value Date   WBC  7.9 02/27/2018   HGB 14.2 02/27/2018   HCT 42.7 02/27/2018   MCV 88.6 02/27/2018   PLT 171.0 02/27/2018     Lab Results  Component Value Date   DDIMER 0.49 02/27/2018      Lab Results  Component Value Date   TSH 0.63 02/23/2018     Lab Results  Component Value Date   PROBNP 34.0 02/27/2018                  Assessment & Plan:

## 2018-02-27 NOTE — Assessment & Plan Note (Addendum)
-   See V/Q 06/13/14 intermediate  - echo 06/13/14 c/w RV strain, ? Acute on chronic  - Venous dopplers 06/14/14 neg both lower ext but tds  - echo 08/25/14 repeat Echo >  PAS down to 32 - Echo 10/16/14 repeat Echo > wnl  - baseline V/Q repeat requested 01/07/2015 > negative    We never really proved she had PE but clearly had improvement on PAS while on elquis and is low fall risk with obesity/ chronic leg swelling major concerns for missing dx of occult dvt so overall I  favor continuing low dose eliquis indefinitely though only slightly over try off  But either way immediately  restart 5 mg bid for any recurrent symptoms (albeit non-specific in nature) rather than repeat the w/u which would likely also turn out to be non-diagnostic unless CTa is able to be done.   Discussed in detail all the  indications, usual  risks and alternatives  relative to the benefits with patient who agrees to proceed with cards eval by Dr Aundra Dubin for his input then possibly try off eliquis - in meantime will check basline   dimer on rx for future reference as we need all the help we can get to make this call.    Total time devoted to counseling  > 50 % of initial 45 min office visit:  review case with pt/ discussion of options/alternatives/ personally creating written customized instructions  in presence of pt  then going over those specific  Instructions directly with the pt including how to use all of the meds but in particular covering each new medication in detail and the difference between the maintenance= "automatic" meds and the prns using an action plan format for the latter (If this problem/symptom => do that organization reading Left to right).  Please see AVS from this visit for a full list of these instructions which I personally wrote for this pt and  are unique to this visit.

## 2018-02-28 ENCOUNTER — Encounter: Payer: Self-pay | Admitting: Internal Medicine

## 2018-02-28 LAB — D-DIMER, QUANTITATIVE (NOT AT ARMC): D DIMER QUANT: 0.49 ug{FEU}/mL (ref <0.50)

## 2018-02-28 NOTE — Assessment & Plan Note (Signed)
Presently at [facility] 2 with no evidence of chf/ PH/ anemia/ thyroid dz but ongoing issues with obesity and deconditioning/ reviewed with pt = best option to prevent future clots is by staying as active as possible if chooses to stop doac now.

## 2018-02-28 NOTE — Progress Notes (Signed)
Spoke with pt and notified of results per Dr. Wert. Pt verbalized understanding and denied any questions. 

## 2018-03-03 ENCOUNTER — Other Ambulatory Visit (HOSPITAL_COMMUNITY): Payer: Self-pay | Admitting: Cardiology

## 2018-03-13 ENCOUNTER — Other Ambulatory Visit (HOSPITAL_COMMUNITY): Payer: Self-pay | Admitting: Cardiology

## 2018-03-19 ENCOUNTER — Other Ambulatory Visit (HOSPITAL_COMMUNITY): Payer: Self-pay | Admitting: *Deleted

## 2018-03-19 MED ORDER — LISINOPRIL 10 MG PO TABS: ORAL_TABLET | ORAL | 2 refills | Status: DC

## 2018-03-24 ENCOUNTER — Other Ambulatory Visit: Payer: Self-pay | Admitting: Nurse Practitioner

## 2018-04-30 ENCOUNTER — Other Ambulatory Visit: Payer: Self-pay | Admitting: Nurse Practitioner

## 2018-05-07 ENCOUNTER — Other Ambulatory Visit: Payer: Self-pay | Admitting: Nurse Practitioner

## 2018-05-07 DIAGNOSIS — R413 Other amnesia: Secondary | ICD-10-CM

## 2018-05-11 ENCOUNTER — Ambulatory Visit (HOSPITAL_COMMUNITY)
Admission: RE | Admit: 2018-05-11 | Discharge: 2018-05-11 | Disposition: A | Discharge status: Home / Self Care | Payer: Medicare Other | Source: Ambulatory Visit | Attending: Cardiology | Admitting: Cardiology

## 2018-05-11 ENCOUNTER — Other Ambulatory Visit: Payer: Self-pay

## 2018-05-11 VITALS — BP 160/70 | HR 94 | Wt 225.8 lb

## 2018-05-11 DIAGNOSIS — E785 Hyperlipidemia, unspecified: Secondary | ICD-10-CM | POA: Diagnosis not present

## 2018-05-11 DIAGNOSIS — Z794 Long term (current) use of insulin: Secondary | ICD-10-CM | POA: Diagnosis not present

## 2018-05-11 DIAGNOSIS — M1710 Unilateral primary osteoarthritis, unspecified knee: Secondary | ICD-10-CM | POA: Diagnosis not present

## 2018-05-11 DIAGNOSIS — I5032 Chronic diastolic (congestive) heart failure: Secondary | ICD-10-CM | POA: Diagnosis not present

## 2018-05-11 DIAGNOSIS — E1122 Type 2 diabetes mellitus with diabetic chronic kidney disease: Secondary | ICD-10-CM | POA: Diagnosis not present

## 2018-05-11 DIAGNOSIS — I272 Pulmonary hypertension, unspecified: Secondary | ICD-10-CM | POA: Insufficient documentation

## 2018-05-11 DIAGNOSIS — Z79899 Other long term (current) drug therapy: Secondary | ICD-10-CM | POA: Diagnosis not present

## 2018-05-11 DIAGNOSIS — Z79890 Hormone replacement therapy (postmenopausal): Secondary | ICD-10-CM | POA: Insufficient documentation

## 2018-05-11 DIAGNOSIS — I50810 Right heart failure, unspecified: Secondary | ICD-10-CM | POA: Diagnosis present

## 2018-05-11 DIAGNOSIS — E039 Hypothyroidism, unspecified: Secondary | ICD-10-CM | POA: Diagnosis not present

## 2018-05-11 DIAGNOSIS — I130 Hypertensive heart and chronic kidney disease with heart failure and stage 1 through stage 4 chronic kidney disease, or unspecified chronic kidney disease: Secondary | ICD-10-CM | POA: Diagnosis not present

## 2018-05-11 DIAGNOSIS — Z7901 Long term (current) use of anticoagulants: Secondary | ICD-10-CM | POA: Diagnosis not present

## 2018-05-11 DIAGNOSIS — I2729 Other secondary pulmonary hypertension: Secondary | ICD-10-CM

## 2018-05-11 DIAGNOSIS — N183 Chronic kidney disease, stage 3 (moderate): Secondary | ICD-10-CM | POA: Diagnosis not present

## 2018-05-11 LAB — BASIC METABOLIC PANEL
Anion gap: 7 (ref 5–15)
BUN: 28 mg/dL — ABNORMAL HIGH (ref 8–23)
CO2: 28 mmol/L (ref 22–32)
Calcium: 10.4 mg/dL — ABNORMAL HIGH (ref 8.9–10.3)
Chloride: 105 mmol/L (ref 98–111)
Creatinine, Ser: 1.25 mg/dL — ABNORMAL HIGH (ref 0.44–1.00)
GFR calc Af Amer: 43 mL/min — ABNORMAL LOW (ref >60)
GFR, EST NON AFRICAN AMERICAN: 37 mL/min — AB (ref >60)
GLUCOSE: 142 mg/dL — AB (ref 70–99)
Potassium: 4.9 mmol/L (ref 3.5–5.1)
Sodium: 140 mmol/L (ref 135–145)

## 2018-05-11 NOTE — Patient Instructions (Addendum)
Continue Eliquis 2.5 mg Twice daily   Labs today  We will contact you in 1 year to schedule your next appointment.

## 2018-05-12 NOTE — Progress Notes (Signed)
Patient ID: Melissa Shannon, female   DOB: 16-Mar-1928, 84 y.o.   MRN: [id number] PCP: Dr. Bubba Camp Cardiology: Dr. Aundra Dubin  84 y.o. with history of HTN, DM, and immobility due to osteoarthritis presents for followup of right heart failure and PE.  At baseline, patient is not very active.  She has knee arthritis that is very limiting.  She does not do much walking and uses a walker to get around.  She has been very limited for a few years now.  She was admitted in 10/15 with dyspnea.  The dyspnea began about 2-3 days prior to admission.  She would get short of breath after walking about 20 feet with her walker.  She was noted to be in CHF at admission with troponin elevated to 0.6.  Echo showed normal LV size and systolic function but severely dilated/dysfunctional RV with pulmonary hypertension.  V/Q scan was done and showed intermediate probability for PE.  She was seen by pulmonary and thought to have a PE.  Apixaban was started.  She was diuresed with IV Lasix.  Repeat echo was done 3/16.  This showed EF 55-60%, normal RV size and systolic function, and PA systolic pressure 28 mmHg.  V/Q scan in 5/16 was normal.   She was admitted in 4/16 with altered mental status, likely due to E coli urosepsis.   She has been stable symptomatically.  Weight is down 19 lbs. BP elevated today, she says it has not been elevated at other checks recently.  No BRBPR/melena on Eliquis.  She uses a walker, no dyspnea walking short distances.  She gets fatigued/short of breath with longer distances walking.  No chest pain.   ECG (personally reviewed): NSR, normal.   Labs (06/18/14): K 4.4, creatinine 1.44, HCT 35.9, BNP 10167 => 1415 Labs (09/01/2014): K 4.2 Creatinine 1.23  Labs (2/16): K 4.3, creatinine 1.62, HCT 40.7 Labs (7/16): K 5.2, creatinine 1.66 Labs (6/17): creatinine 1.25, LDL 64 Labs (7/17): HCT 43.5 Labs (10/17): K 4.9, creatinine 1.44, BNP 25 Labs (2/18): K 4.6, creatinine 1.27 Labs (7/19): LDL 77, pro-BNP  34, hgb 14.2  PMH: 1. HTN 2. Hyperlipidemia 3. Osteoarthritis: Involving knees, uses walker.  4. Type II diabetes 5. H/o CCY 6. CKD stage III 7. Hypothyroidism 8. PE: Admitted 10/15 with dyspnea/CHF.  Echo (10/15) with EF 60-65%, D-shaped interventricular septum, severely dilated RV with moderately decreased RV systolic function, PA systolic pressure 47 mmHg.  V/Q scan (10/15) was intermediate probability for PE. Patient was started on apixaban.  Echo (3/16) with EF 55-60%, basal inferior hypokinesis, normal RV size and systolic function, PA systolic pressure 28 mmHg. V/Q scan 5/16 was normal.  9. Nodules on CT chest 10/15: Per Dr Melvyn Novas, likely benign findings.  10. GERD  FH: No h/o venous thromboembolism, no heart problems that she knows of.   SH: Widow, lives alone, nonsmoker, 3 kids.   ROS: All systems reviewed and negative except as per HPI.   Current Outpatient Medications  Medication Sig Dispense Refill  . alendronate (FOSAMAX) 70 MG tablet TAKE 1 TABLET BY MOUTH ONCE WEEKLY TAKE WITH FULL GLASS OF WATER ON AN EMPTY STOMACH 12 tablet 6  . BD PEN NEEDLE NANO U/F 32G X 4 MM MISC USE AS DIRECTED ONCE DAILY 100 each 11  . Blood Glucose Monitoring Suppl (ONE TOUCH ULTRA 2) w/Device KIT 1 Device by Does not apply route daily. Dx: E11.22 1 each 0  . Calcium Carbonate-Vitamin D (CALTRATE 600+D) 600-400 MG-UNIT tablet Take 1  tablet by mouth 2 (two) times daily.    . Cholecalciferol (VITAMIN D3) 5000 UNITS CHEW Chew 1 tablet by mouth daily.    Marland Kitchen docusate sodium (COLACE) 100 MG capsule Take 1 capsule (100 mg total) by mouth 2 (two) times daily. 10 capsule 0  . donepezil (ARICEPT) 10 MG tablet TAKE ONE TABLET BY MOUTH ONCE DAILY AT BEDTIME TO PRESERVE MEMORY 90 tablet 1  . ELIQUIS 5 MG TABS tablet TAKE 1 TABLET BY MOUTH TWICE A DAY 60 tablet 3  . feeding supplement, ENSURE COMPLETE, (ENSURE COMPLETE) LIQD Take 237 mLs by mouth 2 (two) times daily between meals as needed (As requested by  patient). 30 Bottle 0  . furosemide (LASIX) 20 MG tablet Take 0.5 tablets (10 mg total) by mouth daily. 45 tablet 3  . Insulin Detemir (LEVEMIR FLEXTOUCH) 100 UNIT/ML Pen Inject 24 Units into the skin daily at 10 pm.    . levothyroxine (SYNTHROID, LEVOTHROID) 100 MCG tablet TAKE 1 TABLET BY MOUTH EVERY DAY BEFORE BREAKFAST 90 tablet 1  . lisinopril (PRINIVIL,ZESTRIL) 10 MG tablet TAKE 1 TABLET (10 MG TOTAL) BY MOUTH 2 (TWO) TIMES DAILY 60 tablet 2  . Multiple Vitamin (MULTIVITAMIN WITH MINERALS) TABS tablet Take 1 tablet by mouth daily. 30 tablet 0  . ONE TOUCH ULTRA TEST test strip Use every other day to test blood sugar 100 each 3  . ONETOUCH DELICA LANCETS 13Y MISC Use every other day to test blood sugar. Dx: E11.22 100 each 3  . rosuvastatin (CRESTOR) 20 MG tablet Take one tablet by mouth once daily for cholesterol 90 tablet 3  . TRADJENTA 5 MG TABS tablet TAKE 1 TABLET BY MOUTH EVERY DAY 30 tablet 3   No current facility-administered medications for this encounter.    BP (!) 160/70 (BP Location: Left Arm, Patient Position: Sitting)   Pulse 94   Wt 102.4 kg (225 lb 12.8 oz)   SpO2 94%   BMI 34.33 kg/m   General: NAD Neck: No JVD, no thyromegaly or thyroid nodule.  Lungs: Clear to auscultation bilaterally with normal respiratory effort. CV: Nondisplaced PMI.  Heart regular S1/S2, no S3/S4, no murmur.  Lymphedema bilateral lower legs, no pitting edema.  No carotid bruit.  Normal pedal pulses.  Abdomen: Soft, nontender, no hepatosplenomegaly, no distention.  Skin: Intact without lesions or rashes.  Neurologic: Alert and oriented x 3.  Psych: Normal affect. Extremities: No clubbing or cyanosis.  HEENT: Normal.   Assessment/Plan 1. Right heart failure: Echo on 10/15 showed dilated RV with decreased systolic function.  V/Q scan with suspected PE.  3/16 echo showed normal RV size and systolic function with normal estimate PA systolic pressure.  5/16 V/Q scan showed no PE.  Stable  exertional dyspnea, suspect deconditioning plays a large role here.  She does not appear volume overloaded on exam. - Continue current Lasix regimen, she is taking 10 mg daily.  BMET today.  2. PE: V/Q scan intermediate probability for PE in 10/15.  Pulmonary saw and suspect that she did indeed have PE.  The PE was spontaneous.  She is at risk given general immobility. No bleeding problems.  She did not have evidence for chronic PE on 5/16 V/Q scan.   - I would recommend that she continue apixaban at 2.5 mg bid long-term to decrease long-term risk as she will likely remain fairly immobile long-term due to her arthritis.    3. HTN: BP elevated today, she says that it usually is not elevated.  Will not increase meds today, continue lisinopril 10 mg bid.  PCP should follow BP closely.   4. Hyperlipidemia: Continue Crestor, good LDL in 7/19.    Followup in 1 year.   Loralie Champagne 05/12/2018

## 2018-05-24 ENCOUNTER — Other Ambulatory Visit: Payer: Self-pay | Admitting: Nurse Practitioner

## 2018-05-26 ENCOUNTER — Other Ambulatory Visit: Payer: Self-pay | Admitting: Nurse Practitioner

## 2018-05-28 ENCOUNTER — Ambulatory Visit (INDEPENDENT_AMBULATORY_CARE_PROVIDER_SITE_OTHER): Payer: Medicare Other | Admitting: Nurse Practitioner

## 2018-05-28 ENCOUNTER — Encounter: Payer: Self-pay | Admitting: Nurse Practitioner

## 2018-05-28 VITALS — BP 126/80 | HR 86 | Temp 98.2°F | Ht 68.0 in | Wt 223.0 lb

## 2018-05-28 DIAGNOSIS — E1159 Type 2 diabetes mellitus with other circulatory complications: Secondary | ICD-10-CM

## 2018-05-28 DIAGNOSIS — S90112A Contusion of left great toe without damage to nail, initial encounter: Secondary | ICD-10-CM | POA: Diagnosis not present

## 2018-05-28 DIAGNOSIS — E1122 Type 2 diabetes mellitus with diabetic chronic kidney disease: Secondary | ICD-10-CM | POA: Diagnosis not present

## 2018-05-28 DIAGNOSIS — Z794 Long term (current) use of insulin: Secondary | ICD-10-CM

## 2018-05-28 DIAGNOSIS — E1169 Type 2 diabetes mellitus with other specified complication: Secondary | ICD-10-CM

## 2018-05-28 DIAGNOSIS — I10 Essential (primary) hypertension: Secondary | ICD-10-CM

## 2018-05-28 DIAGNOSIS — E785 Hyperlipidemia, unspecified: Secondary | ICD-10-CM

## 2018-05-28 DIAGNOSIS — N183 Chronic kidney disease, stage 3 (moderate): Secondary | ICD-10-CM

## 2018-05-28 NOTE — Progress Notes (Signed)
Careteam: Patient Care Team: Lauree Chandler, NP as PCP - General (Nurse Practitioner) Tanda Rockers, MD as Consulting Physician (Pulmonary Disease) Larey Dresser, MD as Consulting Physician (Cardiology)  Advanced Directive information Does Patient Have a Medical Advance Directive?: Yes, Type of Advance Directive: Out of facility DNR (pink MOST or yellow form), Pre-existing out of facility DNR order (yellow form or pink MOST form): Yellow form placed in chart (order not valid for inpatient use)  Allergies  Allergen Reactions  . Penicillins Other (See Comments)    Throat felt tight    Chief Complaint  Patient presents with  . Medical Management of Chronic Issues    3 month follow-up   . Foot Problem    Injured big toe on left foot. Patient dropped a gallon of lemonade on foot Saturday. Patient denies pain. Patient iced toe the day of injury.  . Immunizations    Refused flu vaccine and pneumonia vaccine      HPI: Patient is a 84 y.o. female seen in the office today for routine follow up.  2 days ago dropped gallon of lemonade on her left foot great toe. Put ice on it on and off. Not having pain now, throbbed some afterwards. noted bruising.   DM - A1c 7%; taking levemir 24 units. Fasting blood sugars around 150s. No low blood sugars.   Hyperlipidemia - stable on crestor. LDL 77  Hypothyroidism - stable on levothyroxine; TSH 0.63  Hx PE/DVT - dx in November 2015. followed up with pulmonary and cardiology and recommended to continue eliquis for prevention.   CKD - stage 3. Cr 1.25- stable on labs 2 weeks ago  Osteoporosis - stable. Takes fosamax weekly, continues on cal and vit D. DXA in 12/2016 with T score -3.2  Memory loss - stable on aricept. MMSE 27/30  HTN - stable on lasix and lisinopril.   Arthralgias/joint swelling - She has arthritis and continues has difficulty grasping objects (dropped lemonade bottle). Increased joint stiffness. Uses walker at  home along with cane.   Recently got permanent dentures  Decline flu shot.  Review of Systems:  Review of Systems  Constitutional: Negative for chills and fever.  Respiratory: Negative for cough and sputum production.   Cardiovascular: Negative for chest pain and palpitations.  Musculoskeletal: Positive for myalgias. Negative for back pain, falls, joint pain and neck pain.  Skin: Negative for itching and rash.  Neurological: Negative for dizziness and headaches.    Past Medical History:  Diagnosis Date  . Arthritis    bilateral knees  . CHF (congestive heart failure) ([place])   . Diabetes mellitus without complication ([place])   . Edema   . Hyperlipidemia   . Hypertension   . Hypothyroidism   . Pulmonary embolism ([place])   . Urinary incontinence    Past Surgical History:  Procedure Laterality Date  . BLADDER SURGERY  2000   Dr.Tannerbaum (Bladder Tact)  . CATARACT EXTRACTION  03/2013   Dr.Shapiro  . CHOLECYSTECTOMY    . EYE SURGERY     bilateral cataracts  . [facility]   . right elbow  1999   x 2, still has one piece of metal in it  . TONSILLECTOMY AND ADENOIDECTOMY  1937   Dr.Brewer   Social History:   reports that she has never smoked. She has never used smokeless tobacco. She reports that she does not drink alcohol or use drugs.  Family History  Problem Relation Age of Onset  . Suicidality Father   . Cancer Brother   . Cancer Maternal Grandfather   . Diabetes Maternal Grandmother   . Melanoma Brother     Medications: Patient's Medications  New Prescriptions   No medications on file  Previous Medications   ALENDRONATE (FOSAMAX) 70 MG TABLET    TAKE 1 TABLET BY MOUTH ONCE WEEKLY TAKE WITH FULL GLASS OF WATER ON AN EMPTY STOMACH   BD PEN NEEDLE NANO U/F 32G X 4 MM MISC    USE AS DIRECTED ONCE DAILY   BLOOD GLUCOSE MONITORING SUPPL (ONE TOUCH ULTRA 2) W/DEVICE KIT    1 Device by Does not apply route daily. Dx: E11.22   CALCIUM  CARBONATE-VITAMIN D (CALTRATE 600+D) 600-400 MG-UNIT TABLET    Take 1 tablet by mouth 2 (two) times daily.   CHOLECALCIFEROL (VITAMIN D3) 5000 UNITS CHEW    Chew 1 tablet by mouth daily.   DONEPEZIL (ARICEPT) 10 MG TABLET    TAKE ONE TABLET BY MOUTH ONCE DAILY AT BEDTIME TO PRESERVE MEMORY   ELIQUIS 5 MG TABS TABLET    TAKE 1 TABLET BY MOUTH TWICE A DAY   FEEDING SUPPLEMENT, ENSURE COMPLETE, (ENSURE COMPLETE) LIQD    Take 237 mLs by mouth 2 (two) times daily between meals as needed (As requested by patient).   FUROSEMIDE (LASIX) 20 MG TABLET    Take 0.5 tablets (10 mg total) by mouth daily.   INSULIN DETEMIR (LEVEMIR FLEXTOUCH) 100 UNIT/ML PEN    Inject 24 Units into the skin daily at 10 pm.   LEVOTHYROXINE (SYNTHROID, LEVOTHROID) 100 MCG TABLET    TAKE 1 TABLET BY MOUTH EVERY DAY BEFORE BREAKFAST   LISINOPRIL (PRINIVIL,ZESTRIL) 10 MG TABLET    TAKE 1 TABLET (10 MG TOTAL) BY MOUTH 2 (TWO) TIMES DAILY   MULTIPLE VITAMIN (MULTIVITAMIN WITH MINERALS) TABS TABLET    Take 1 tablet by mouth daily.   ONE TOUCH ULTRA TEST TEST STRIP    Use every other day to test blood sugar   ONETOUCH DELICA LANCETS 03Y MISC    Use every other day to test blood sugar. Dx: E11.22   ROSUVASTATIN (CRESTOR) 20 MG TABLET    TAKE 1 TABLET EVERY DAY FOR CHOLESTEROL   TRADJENTA 5 MG TABS TABLET    TAKE 1 TABLET BY MOUTH EVERY DAY  Modified Medications   No medications on file  Discontinued Medications   DOCUSATE SODIUM (COLACE) 100 MG CAPSULE    Take 1 capsule (100 mg total) by mouth 2 (two) times daily.     Physical Exam:  Vitals:   05/28/18 0951  BP: 126/80  Pulse: 86  Temp: 98.2 F (36.8 C)  TempSrc: Oral  SpO2: 92%  Weight: 223 lb (101.2 kg)  Height: '5\' 8"'$  (1.727 m)   Body mass index is 33.91 kg/m.  Physical Exam  Constitutional: She appears well-developed and well-nourished.  HENT:  Mouth/Throat: Oropharynx is clear and moist. No oropharyngeal exudate.  Eyes: Pupils are equal, round, and reactive to  light. No scleral icterus.  Neck: Neck supple. Carotid bruit is not present. No tracheal deviation present. No thyromegaly present.  Cardiovascular: Normal rate, regular rhythm and intact distal pulses. Exam reveals no gallop and no friction rub.  Murmur (1/6 SEM) heard. Pulmonary/Chest: Effort normal and breath sounds normal. No stridor. No respiratory distress. She has no wheezes. She has no rales.  Abdominal: Soft. Normal appearance and bowel sounds are normal. She exhibits no distension and  no mass. There is no hepatomegaly. There is no tenderness. There is no rigidity, no rebound and no guarding. No hernia.  obese  Musculoskeletal: She exhibits tenderness (noted to left great toe with brusing ). She exhibits no edema.  Lymphadenopathy:    She has no cervical adenopathy.  Neurological: She is alert. She has normal reflexes. Gait (unsteady) abnormal.  B/l LE weakness  Skin: Skin is warm and dry. No rash noted.  Psychiatric: She has a normal mood and affect. Her behavior is normal. Judgment and thought content normal.    Labs reviewed: Basic Metabolic Panel: Recent Labs    09/28/17 0917 02/05/18 1049 02/23/18 1120 05/11/18 1001  NA 142 141  --  140  K 4.5 4.5  --  4.9  CL 105 105  --  105  CO2 31 32  --  28  GLUCOSE 131* 129  --  142*  BUN 27* 29*  --  28*  CREATININE 1.46* 1.26*  --  1.25*  CALCIUM 10.4 10.5*  --  10.4*  TSH  --   --  0.63  --    Liver Function Tests: Recent Labs    06/23/17 0925 09/28/17 0917 02/05/18 1049  AST '12 12 18  '$ ALT '8 9 14  '$ BILITOT 0.5 0.6 0.5  PROT 6.6 6.3 6.8   No results for input(s): LIPASE, AMYLASE in the last 8760 hours. No results for input(s): AMMONIA in the last 8760 hours. CBC: Recent Labs    02/27/18 1528  WBC 7.9  NEUTROABS 4.9  HGB 14.2  HCT 42.7  MCV 88.6  PLT 171.0   Lipid Panel: Recent Labs    02/23/18 1120  CHOL 166  HDL 64  LDLCALC 77  TRIG 151*  CHOLHDL 2.6   TSH: Recent Labs    02/23/18 1120  TSH  0.63   A1C: Lab Results  Component Value Date   HGBA1C 7.0 (H) 02/05/2018     Assessment/Plan 1. Hyperlipidemia associated with type 2 diabetes mellitus ([place]) LDL 77 in July, to continue on crestor 20 mg daily with dietary modifications.   2. Type 2 diabetes mellitus with stage 3 chronic kidney disease, with long-term current use of insulin ([facility]) Reports fasting blood sugars ~150 on levemir 24 units and tradjenta 5 mg daily  Dietary modifications encouraged. Will follow up lab today - Hemoglobin A1c  3. Hypertension associated with diabetes ([place]) Stable on current regimen.   4. Contusion of left great toe without damage to nail, initial encounter Pain has improved, to continue to use ice 2-3 times daily, with elevation. To notify if pain, swelling or bruising worsens.   Next appt: 08/23/2018 Carlos American. [place], [facility] [phone]

## 2018-05-28 NOTE — Telephone Encounter (Signed)
Refill sent 04/30/18

## 2018-05-28 NOTE — Patient Instructions (Signed)
Continue to ice toe 3 times daily ~20 mins for 5 days Monitor for increase in swelling, heat or pain and notify   DASH Eating Plan DASH stands for "Dietary Approaches to Stop Hypertension." The DASH eating plan is a healthy eating plan that has been shown to reduce high blood pressure (hypertension). It may also reduce your risk for type 2 diabetes, heart disease, and stroke. The DASH eating plan may also help with weight loss. What are tips for following this plan? General guidelines  Avoid eating more than 2,300 mg (milligrams) of salt (sodium) a day. If you have hypertension, you may need to reduce your sodium intake to 1,500 mg a day.  Limit alcohol intake to no more than 1 drink a day for nonpregnant women and 2 drinks a day for men. One drink equals 12 oz of beer, 5 oz of wine, or 1 oz of hard liquor.  Work with your health care provider to maintain a healthy body weight or to lose weight. Ask what an ideal weight is for you.  Get at least 30 minutes of exercise that causes your heart to beat faster (aerobic exercise) most days of the week. Activities may include walking, swimming, or biking.  Work with your health care provider or diet and nutrition specialist (dietitian) to adjust your eating plan to your individual calorie needs. Reading food labels  Check food labels for the amount of sodium per serving. Choose foods with less than 5 percent of the Daily Value of sodium. Generally, foods with less than 300 mg of sodium per serving fit into this eating plan.  To find whole grains, look for the word "whole" as the first word in the ingredient list. Shopping  Buy products labeled as "low-sodium" or "no salt added."  Buy fresh foods. Avoid canned foods and premade or frozen meals. Cooking  Avoid adding salt when cooking. Use salt-free seasonings or herbs instead of table salt or sea salt. Check with your health care provider or pharmacist before using salt substitutes.  Do not  fry foods. Cook foods using healthy methods such as baking, boiling, grilling, and broiling instead.  Cook with heart-healthy oils, such as olive, canola, soybean, or sunflower oil. Meal planning   Eat a balanced diet that includes: ? 5 or more servings of fruits and vegetables each day. At each meal, try to fill half of your plate with fruits and vegetables. ? Up to 6-8 servings of whole grains each day. ? Less than 6 oz of lean meat, poultry, or fish each day. A 3-oz serving of meat is about the same size as a deck of cards. One egg equals 1 oz. ? 2 servings of low-fat dairy each day. ? A serving of nuts, seeds, or beans 5 times each week. ? Heart-healthy fats. Healthy fats called Omega-3 fatty acids are found in foods such as flaxseeds and coldwater fish, like sardines, salmon, and mackerel.  Limit how much you eat of the following: ? Canned or prepackaged foods. ? Food that is high in trans fat, such as fried foods. ? Food that is high in saturated fat, such as fatty meat. ? Sweets, desserts, sugary drinks, and other foods with added sugar. ? Full-fat dairy products.  Do not salt foods before eating.  Try to eat at least 2 vegetarian meals each week.  Eat more home-cooked food and less restaurant, buffet, and fast food.  When eating at a restaurant, ask that your food be prepared with less salt  or no salt, if possible. What foods are recommended? The items listed may not be a complete list. Talk with your dietitian about what dietary choices are best for you. Grains Whole-grain or whole-wheat bread. Whole-grain or whole-wheat pasta. Brown rice. [place]ena Morrow. Bulgur. Whole-grain and low-sodium cereals. Pita bread. Low-fat, low-sodium crackers. Whole-wheat flour tortillas. Vegetables Fresh or frozen vegetables (raw, steamed, roasted, or grilled). Low-sodium or reduced-sodium tomato and vegetable juice. Low-sodium or reduced-sodium tomato sauce and tomato paste. Low-sodium or  reduced-sodium canned vegetables. Fruits All fresh, dried, or frozen fruit. Canned fruit in natural juice (without added sugar). Meat and other protein foods Skinless chicken or [place]. Ground chicken or [place]. Pork with fat trimmed off. Fish and seafood. Egg whites. Dried beans, peas, or lentils. Unsalted nuts, nut butters, and seeds. Unsalted canned beans. Lean cuts of beef with fat trimmed off. Low-sodium, lean deli meat. Dairy Low-fat (1%) or fat-free (skim) milk. Fat-free, low-fat, or reduced-fat cheeses. Nonfat, low-sodium ricotta or cottage cheese. Low-fat or nonfat yogurt. Low-fat, low-sodium cheese. Fats and oils Soft margarine without trans fats. Vegetable oil. Low-fat, reduced-fat, or light mayonnaise and salad dressings (reduced-sodium). Canola, safflower, olive, soybean, and sunflower oils. Avocado. Seasoning and other foods Herbs. Spices. Seasoning mixes without salt. Unsalted popcorn and pretzels. Fat-free sweets. What foods are not recommended? The items listed may not be a complete list. Talk with your dietitian about what dietary choices are best for you. Grains Baked goods made with fat, such as croissants, muffins, or some breads. Dry pasta or rice meal packs. Vegetables Creamed or fried vegetables. Vegetables in a cheese sauce. Regular canned vegetables (not low-sodium or reduced-sodium). Regular canned tomato sauce and paste (not low-sodium or reduced-sodium). Regular tomato and vegetable juice (not low-sodium or reduced-sodium). Angie Fava. Olives. Fruits Canned fruit in a light or heavy syrup. Fried fruit. Fruit in cream or butter sauce. Meat and other protein foods Fatty cuts of meat. Ribs. Fried meat. Berniece Salines. Sausage. Bologna and other processed lunch meats. Salami. Fatback. Hotdogs. Bratwurst. Salted nuts and seeds. Canned beans with added salt. Canned or smoked fish. Whole eggs or egg yolks. Chicken or [place] with skin. Dairy Whole or 2% milk, cream, and half-and-half.  Whole or full-fat cream cheese. Whole-fat or sweetened yogurt. Full-fat cheese. Nondairy creamers. Whipped toppings. Processed cheese and cheese spreads. Fats and oils Butter. Stick margarine. Lard. Shortening. Ghee. Bacon fat. Tropical oils, such as coconut, palm kernel, or palm oil. Seasoning and other foods Salted popcorn and pretzels. Onion salt, garlic salt, seasoned salt, table salt, and sea salt. Worcestershire sauce. Tartar sauce. Barbecue sauce. Teriyaki sauce. Soy sauce, including reduced-sodium. Steak sauce. Canned and packaged gravies. Fish sauce. Oyster sauce. Cocktail sauce. Horseradish that you find on the shelf. Ketchup. Mustard. Meat flavorings and tenderizers. Bouillon cubes. Hot sauce and Tabasco sauce. Premade or packaged marinades. Premade or packaged taco seasonings. Relishes. Regular salad dressings. Where to find more information:  National Heart, Lung, and [place]: https://wilson-eaton.com/  American Heart Association: www.heart.org Summary  The DASH eating plan is a healthy eating plan that has been shown to reduce high blood pressure (hypertension). It may also reduce your risk for type 2 diabetes, heart disease, and stroke.  With the DASH eating plan, you should limit salt (sodium) intake to 2,300 mg a day. If you have hypertension, you may need to reduce your sodium intake to 1,500 mg a day.  When on the DASH eating plan, aim to eat more fresh fruits and vegetables, whole grains, lean proteins, low-fat dairy,  and heart-healthy fats.  Work with your health care provider or diet and nutrition specialist (dietitian) to adjust your eating plan to your individual calorie needs. This information is not intended to replace advice given to you by your health care provider. Make sure you discuss any questions you have with your health care provider. Document Released: 07/21/2011 Document Revised: 07/25/2016 Document Reviewed: 07/25/2016 Elsevier Interactive Patient Education   [employer].

## 2018-05-29 LAB — HEMOGLOBIN A1C
HEMOGLOBIN A1C: 6.8 %{Hb} — AB (ref <5.7)
Mean Plasma Glucose: 148 (calc)
eAG (mmol/L): 8.2 (calc)

## 2018-05-31 ENCOUNTER — Other Ambulatory Visit: Payer: Self-pay | Admitting: Nurse Practitioner

## 2018-06-02 ENCOUNTER — Other Ambulatory Visit: Payer: Self-pay | Admitting: Nurse Practitioner

## 2018-06-06 ENCOUNTER — Other Ambulatory Visit (HOSPITAL_COMMUNITY): Payer: Self-pay | Admitting: Cardiology

## 2018-06-09 ENCOUNTER — Other Ambulatory Visit: Payer: Self-pay | Admitting: Nurse Practitioner

## 2018-07-02 LAB — HM DIABETES EYE EXAM

## 2018-07-03 ENCOUNTER — Encounter: Payer: Self-pay | Admitting: Nurse Practitioner

## 2018-08-23 ENCOUNTER — Ambulatory Visit: Payer: Medicare Other | Admitting: Nurse Practitioner

## 2018-08-29 ENCOUNTER — Other Ambulatory Visit: Payer: Self-pay

## 2018-08-29 ENCOUNTER — Emergency Department (HOSPITAL_COMMUNITY)
Admission: EM | Admit: 2018-08-29 | Discharge: 2018-08-29 | Disposition: A | Discharge status: Home / Self Care | Payer: Medicare Other | Attending: Emergency Medicine | Admitting: Emergency Medicine

## 2018-08-29 ENCOUNTER — Encounter (HOSPITAL_COMMUNITY): Payer: Self-pay | Admitting: *Deleted

## 2018-08-29 DIAGNOSIS — N184 Chronic kidney disease, stage 4 (severe): Secondary | ICD-10-CM | POA: Insufficient documentation

## 2018-08-29 DIAGNOSIS — Z7901 Long term (current) use of anticoagulants: Secondary | ICD-10-CM | POA: Insufficient documentation

## 2018-08-29 DIAGNOSIS — Z794 Long term (current) use of insulin: Secondary | ICD-10-CM | POA: Insufficient documentation

## 2018-08-29 DIAGNOSIS — E1122 Type 2 diabetes mellitus with diabetic chronic kidney disease: Secondary | ICD-10-CM | POA: Insufficient documentation

## 2018-08-29 DIAGNOSIS — J9621 Acute and chronic respiratory failure with hypoxia: Secondary | ICD-10-CM | POA: Diagnosis not present

## 2018-08-29 DIAGNOSIS — I130 Hypertensive heart and chronic kidney disease with heart failure and stage 1 through stage 4 chronic kidney disease, or unspecified chronic kidney disease: Secondary | ICD-10-CM | POA: Insufficient documentation

## 2018-08-29 DIAGNOSIS — I5032 Chronic diastolic (congestive) heart failure: Secondary | ICD-10-CM | POA: Insufficient documentation

## 2018-08-29 DIAGNOSIS — J029 Acute pharyngitis, unspecified: Secondary | ICD-10-CM | POA: Insufficient documentation

## 2018-08-29 DIAGNOSIS — Z79899 Other long term (current) drug therapy: Secondary | ICD-10-CM | POA: Insufficient documentation

## 2018-08-29 LAB — GROUP A STREP BY PCR: GROUP A STREP BY PCR: NOT DETECTED

## 2018-08-29 MED ORDER — ACETAMINOPHEN 325 MG PO TABS
650.0000 mg | ORAL_TABLET | Freq: Once | ORAL | Status: AC
  Administered 2018-08-29: 650 mg via ORAL
  Filled 2018-08-29: qty 2

## 2018-08-29 NOTE — Discharge Instructions (Addendum)
Try using over-the-counter throat sprays such as Cepacol to help with the pain and discomfort.  Take as directed on the bottle.  You can also take over-the-counter Tylenol for your pain.  Warm salt water gargles can also help with the discomfort.  Continue with softer foods and soups until the irritation resolves.  Follow-up with your doctor next week as planned.  Return to the emergency room as needed for worsening symptoms

## 2018-08-29 NOTE — ED Triage Notes (Signed)
Pt presents with a sore throat, hoarseness in her voice, and pain upon swallowing.  Pt reports her symptoms started on Monday but the loss of her voice started on Tuesday.  Pt denies fevers/chills or any other symptoms. Pt a/o x 4.

## 2018-08-29 NOTE — ED Provider Notes (Signed)
[facility] DEPT Provider Note   CSN: [id number] Arrival date & time: 08/29/18  0228     History   Chief Complaint Chief Complaint  Patient presents with  . Sore Throat    HPI Melissa Shannon is a 85 y.o. female.  HPI Pt started having a sore throat on Monday.  It hurts to swallow.  Her voice is hoarse.  She has not had a fever.   She had a cough and a runny nose.  She is not having trouble breathing.  Pt does not smoke.  No chest pain. No throat swelling.  Past Medical History:  Diagnosis Date  . Arthritis    bilateral knees  . CHF (congestive heart failure) ([place])   . Diabetes mellitus without complication ([place])   . Edema   . Hyperlipidemia   . Hypertension   . Hypothyroidism   . Pulmonary embolism ([place])   . Urinary incontinence     Patient Active Problem List   Diagnosis Date Noted  . Tachycardia 02/27/2018  . DOE (dyspnea on exertion) 02/27/2018  . Chronic diastolic CHF (congestive heart failure) ([place]) 03/05/2016  . CKD (chronic kidney disease) stage 4, GFR 15-29 ml/min (HCC) 03/19/2015  . General weakness 11/29/2014  . UTI (urinary tract infection) 11/29/2014  . Diabetes mellitus with renal manifestations, uncontrolled ([place]) 11/29/2014  . Aphasia 11/28/2014  . Pulmonary embolus ([place]) 06/23/2014  . Multiple pulmonary nodules 06/14/2014  . Acute respiratory failure with hypoxia ([place]) 06/14/2014  . Malnutrition of moderate degree ([place]) 06/12/2014  . Hypertension 06/11/2014  . Hyperlipemia 06/11/2014  . CHF (congestive heart failure) ([place]) 06/11/2014    Past Surgical History:  Procedure Laterality Date  . BLADDER SURGERY  2000   Dr.Tannerbaum (Bladder Tact)  . CATARACT EXTRACTION  03/2013   Dr.Shapiro  . CHOLECYSTECTOMY    . EYE SURGERY     bilateral cataracts  . [facility]   . right elbow  1999   x 2, still has one piece of metal in it  . TONSILLECTOMY AND ADENOIDECTOMY  1937   Dr.Brewer     OB History   No obstetric history on file.      Home Medications    Prior to Admission medications   Medication Sig Start Date End Date Taking? Authorizing Provider  alendronate (FOSAMAX) 70 MG tablet TAKE 1 TABLET BY MOUTH ONCE WEEKLY TAKE WITH FULL GLASS OF WATER ON AN EMPTY STOMACH Patient taking differently: Take 70 mg by mouth every Wednesday.  03/26/18  Yes Lauree Chandler, NP  Calcium Carbonate-Vitamin D (CALTRATE 600+D) 600-400 MG-UNIT tablet Take 1 tablet by mouth 2 (two) times daily.   Yes [provider]  Cholecalciferol (VITAMIN D3) 5000 UNITS CHEW Chew 5,000 Units by mouth daily.    Yes [provider]  donepezil (ARICEPT) 10 MG tablet TAKE ONE TABLET BY MOUTH ONCE DAILY AT BEDTIME TO PRESERVE MEMORY Patient taking differently: Take 10 mg by mouth at bedtime. Take one tablet by mouth once daily at bedtime to preserve memory 05/07/18  Yes Eubanks, Carlos American, NP  ELIQUIS 5 MG TABS tablet TAKE 1 TABLET BY MOUTH TWICE A DAY Patient taking differently: Take 2.5 mg by mouth 2 (two) times daily.  04/30/18  Yes Lauree Chandler, NP  feeding supplement, ENSURE COMPLETE, (ENSURE COMPLETE) LIQD Take 237 mLs by mouth 2 (two) times daily between meals as needed (As requested by patient). Patient taking differently: Take 237 mLs  by mouth daily.  06/19/14  Yes Regalado, Belkys A, MD  furosemide (LASIX) 20 MG tablet Take 0.5 tablets (10 mg total) by mouth daily. Patient taking differently: Take 10 mg by mouth 2 (two) times daily.  02/20/17  Yes Larey Dresser, MD  hydroxypropyl methylcellulose / hypromellose (ISOPTO TEARS / GONIOVISC) 2.5 % ophthalmic solution Place 1 drop into both eyes 3 (three) times daily as needed for dry eyes.   Yes [provider]  LEVEMIR FLEXTOUCH 100 UNIT/ML Pen INJECT 24 UNITS INTO THE SKIN DAILY. Patient taking differently: Inject 24 Units into the skin daily.  06/11/18  Yes Lauree Chandler, NP  levothyroxine  (SYNTHROID, LEVOTHROID) 100 MCG tablet TAKE 1 TABLET BY MOUTH EVERY DAY BEFORE BREAKFAST Patient taking differently: Take 100 mcg by mouth daily before breakfast.  05/31/18  Yes Eubanks, Carlos American, NP  lisinopril (PRINIVIL,ZESTRIL) 10 MG tablet TAKE 1 TABLET BY MOUTH TWICE A DAY Patient taking differently: Take 10 mg by mouth 2 (two) times daily.  06/06/18  Yes Larey Dresser, MD  Multiple Vitamin (MULTIVITAMIN WITH MINERALS) TABS tablet Take 1 tablet by mouth daily. 06/19/14  Yes Regalado, Belkys A, MD  rosuvastatin (CRESTOR) 20 MG tablet TAKE 1 TABLET EVERY DAY FOR CHOLESTEROL Patient taking differently: Take 20 mg by mouth daily.  05/24/18  Yes Eubanks, Carlos American, NP  TRADJENTA 5 MG TABS tablet TAKE 1 TABLET BY MOUTH EVERY DAY Patient taking differently: Take 5 mg by mouth daily.  06/04/18  Yes Lauree Chandler, NP  BD PEN NEEDLE NANO U/F 32G X 4 MM MISC USE AS DIRECTED ONCE DAILY 02/26/18   Lauree Chandler, NP  Blood Glucose Monitoring Suppl (ONE TOUCH ULTRA 2) w/Device KIT 1 Device by Does not apply route daily. Dx: E11.22 02/05/18   Lauree Chandler, NP  ONE TOUCH ULTRA TEST test strip Use every other day to test blood sugar 11/04/15   Gildardo Cranker, DO  [facility] DELICA LANCETS 45Y MISC Use every other day to test blood sugar. Dx: E11.22 12/27/16   Lauree Chandler, NP    Family History Family History  Problem Relation Age of Onset  . Suicidality Father   . Cancer Brother   . Cancer Maternal Grandfather   . Diabetes Maternal Grandmother   . Melanoma Brother     Social History Social History   Tobacco Use  . Smoking status: Never Smoker  . Smokeless tobacco: Never Used  Substance Use Topics  . Alcohol use: No  . Drug use: No     Allergies   Penicillins   Review of Systems Review of Systems  All other systems reviewed and are negative.    Physical Exam Updated Vital Signs BP (!) 156/66 (BP Location: Right Arm)   Pulse 74   Temp 98.7 F (37.1 C) (Oral)    Resp 14   Ht 1.727 m ('5\' 8"'$ )   Wt 108.9 kg   SpO2 94%   BMI 36.49 kg/m   Physical Exam Vitals signs and nursing note reviewed.  Constitutional:      General: She is not in acute distress.    Appearance: She is well-developed.  HENT:     Head: Normocephalic and atraumatic.     Right Ear: External ear normal.     Left Ear: External ear normal.     Nose: No rhinorrhea.     Mouth/Throat:     Mouth: No oral lesions.     Pharynx: Posterior oropharyngeal erythema present. No  pharyngeal swelling, oropharyngeal exudate or uvula swelling.  Eyes:     General: No scleral icterus.       Right eye: No discharge.        Left eye: No discharge.     Conjunctiva/sclera: Conjunctivae normal.  Neck:     Musculoskeletal: Normal range of motion and neck supple.     Thyroid: No thyromegaly.     Trachea: No tracheal deviation.  Cardiovascular:     Rate and Rhythm: Normal rate and regular rhythm.  Pulmonary:     Effort: Pulmonary effort is normal. No respiratory distress.     Breath sounds: Normal breath sounds. No stridor. No wheezing or rales.  Abdominal:     General: There is no distension.     Palpations: Abdomen is soft.     Tenderness: There is no abdominal tenderness.  Musculoskeletal:        General: No swelling or deformity.  Lymphadenopathy:     Cervical: No cervical adenopathy.  Skin:    General: Skin is warm and dry.     Findings: No rash.  Neurological:     Mental Status: She is alert.     Cranial Nerves: Cranial nerve deficit: no gross deficits.      ED Treatments / Results  Labs (all labs ordered are listed, but only abnormal results are displayed) Labs Reviewed  GROUP A STREP BY PCR    Procedures Procedures (including critical care time)  Medications Ordered in ED Medications  acetaminophen (TYLENOL) tablet 650 mg (has no administration in time range)     Initial Impression / Assessment and Plan / ED Course  I have reviewed the triage vital signs and the  nursing notes.  Pertinent labs & imaging results that were available during my care of the patient were reviewed by me and considered in my medical decision making (see chart for details).    Patient presents with symptoms consistent with pharyngitis.  She has no significant swelling on exam.  Patient's voice is hoarse but she is able to speak easily.  She is not having any difficulty with her secretions.  Strep test is negative.  Symptoms most likely related to viral pharyngitis.  Discussed supportive care and over-the-counter medications.  She does have an appointment with her doctor next week.  Final Clinical Impressions(s) / ED Diagnoses   Final diagnoses:  Viral pharyngitis    ED Discharge Orders    None       Dorie Rank, MD 08/29/18 [phone]

## 2018-08-30 ENCOUNTER — Emergency Department (HOSPITAL_COMMUNITY): Payer: Medicare Other

## 2018-08-30 ENCOUNTER — Encounter (HOSPITAL_COMMUNITY): Payer: Self-pay | Admitting: Emergency Medicine

## 2018-08-30 ENCOUNTER — Observation Stay (HOSPITAL_BASED_OUTPATIENT_CLINIC_OR_DEPARTMENT_OTHER): Payer: Medicare Other

## 2018-08-30 ENCOUNTER — Inpatient Hospital Stay (HOSPITAL_COMMUNITY)
Admission: EM | Admit: 2018-08-30 | Discharge: 2018-09-02 | DRG: 189 | Disposition: A | Discharge status: Home / Self Care | Payer: Medicare Other | Attending: Internal Medicine | Admitting: Internal Medicine

## 2018-08-30 ENCOUNTER — Other Ambulatory Visit: Payer: Self-pay

## 2018-08-30 DIAGNOSIS — T464X5A Adverse effect of angiotensin-converting-enzyme inhibitors, initial encounter: Secondary | ICD-10-CM | POA: Diagnosis present

## 2018-08-30 DIAGNOSIS — J040 Acute laryngitis: Secondary | ICD-10-CM | POA: Diagnosis present

## 2018-08-30 DIAGNOSIS — I5032 Chronic diastolic (congestive) heart failure: Secondary | ICD-10-CM | POA: Diagnosis not present

## 2018-08-30 DIAGNOSIS — R0600 Dyspnea, unspecified: Secondary | ICD-10-CM | POA: Diagnosis not present

## 2018-08-30 DIAGNOSIS — I130 Hypertensive heart and chronic kidney disease with heart failure and stage 1 through stage 4 chronic kidney disease, or unspecified chronic kidney disease: Secondary | ICD-10-CM | POA: Diagnosis present

## 2018-08-30 DIAGNOSIS — F0390 Unspecified dementia without behavioral disturbance: Secondary | ICD-10-CM | POA: Diagnosis present

## 2018-08-30 DIAGNOSIS — J471 Bronchiectasis with (acute) exacerbation: Secondary | ICD-10-CM

## 2018-08-30 DIAGNOSIS — Z79890 Hormone replacement therapy (postmenopausal): Secondary | ICD-10-CM

## 2018-08-30 DIAGNOSIS — I10 Essential (primary) hypertension: Secondary | ICD-10-CM | POA: Diagnosis present

## 2018-08-30 DIAGNOSIS — E1129 Type 2 diabetes mellitus with other diabetic kidney complication: Secondary | ICD-10-CM | POA: Diagnosis not present

## 2018-08-30 DIAGNOSIS — Z9842 Cataract extraction status, left eye: Secondary | ICD-10-CM

## 2018-08-30 DIAGNOSIS — R748 Abnormal levels of other serum enzymes: Secondary | ICD-10-CM | POA: Diagnosis not present

## 2018-08-30 DIAGNOSIS — N183 Chronic kidney disease, stage 3 unspecified: Secondary | ICD-10-CM | POA: Diagnosis present

## 2018-08-30 DIAGNOSIS — IMO0002 Reserved for concepts with insufficient information to code with codable children: Secondary | ICD-10-CM | POA: Diagnosis present

## 2018-08-30 DIAGNOSIS — E1165 Type 2 diabetes mellitus with hyperglycemia: Secondary | ICD-10-CM

## 2018-08-30 DIAGNOSIS — J9601 Acute respiratory failure with hypoxia: Secondary | ICD-10-CM | POA: Diagnosis not present

## 2018-08-30 DIAGNOSIS — R05 Cough: Secondary | ICD-10-CM

## 2018-08-30 DIAGNOSIS — J029 Acute pharyngitis, unspecified: Secondary | ICD-10-CM | POA: Diagnosis present

## 2018-08-30 DIAGNOSIS — I5082 Biventricular heart failure: Secondary | ICD-10-CM | POA: Diagnosis present

## 2018-08-30 DIAGNOSIS — R1310 Dysphagia, unspecified: Secondary | ICD-10-CM | POA: Diagnosis present

## 2018-08-30 DIAGNOSIS — Z833 Family history of diabetes mellitus: Secondary | ICD-10-CM

## 2018-08-30 DIAGNOSIS — Z9841 Cataract extraction status, right eye: Secondary | ICD-10-CM

## 2018-08-30 DIAGNOSIS — Z7901 Long term (current) use of anticoagulants: Secondary | ICD-10-CM

## 2018-08-30 DIAGNOSIS — Z6836 Body mass index (BMI) 36.0-36.9, adult: Secondary | ICD-10-CM

## 2018-08-30 DIAGNOSIS — Z79899 Other long term (current) drug therapy: Secondary | ICD-10-CM

## 2018-08-30 DIAGNOSIS — J449 Chronic obstructive pulmonary disease, unspecified: Secondary | ICD-10-CM | POA: Diagnosis present

## 2018-08-30 DIAGNOSIS — J9621 Acute and chronic respiratory failure with hypoxia: Secondary | ICD-10-CM | POA: Diagnosis present

## 2018-08-30 DIAGNOSIS — E669 Obesity, unspecified: Secondary | ICD-10-CM | POA: Diagnosis present

## 2018-08-30 DIAGNOSIS — E785 Hyperlipidemia, unspecified: Secondary | ICD-10-CM | POA: Diagnosis present

## 2018-08-30 DIAGNOSIS — Z808 Family history of malignant neoplasm of other organs or systems: Secondary | ICD-10-CM

## 2018-08-30 DIAGNOSIS — J40 Bronchitis, not specified as acute or chronic: Secondary | ICD-10-CM

## 2018-08-30 DIAGNOSIS — R32 Unspecified urinary incontinence: Secondary | ICD-10-CM | POA: Diagnosis present

## 2018-08-30 DIAGNOSIS — R058 Other specified cough: Secondary | ICD-10-CM

## 2018-08-30 DIAGNOSIS — N184 Chronic kidney disease, stage 4 (severe): Secondary | ICD-10-CM | POA: Diagnosis present

## 2018-08-30 DIAGNOSIS — Z9089 Acquired absence of other organs: Secondary | ICD-10-CM

## 2018-08-30 DIAGNOSIS — Z86711 Personal history of pulmonary embolism: Secondary | ICD-10-CM

## 2018-08-30 DIAGNOSIS — I361 Nonrheumatic tricuspid (valve) insufficiency: Secondary | ICD-10-CM | POA: Diagnosis not present

## 2018-08-30 DIAGNOSIS — Z9049 Acquired absence of other specified parts of digestive tract: Secondary | ICD-10-CM

## 2018-08-30 DIAGNOSIS — E1122 Type 2 diabetes mellitus with diabetic chronic kidney disease: Secondary | ICD-10-CM | POA: Diagnosis present

## 2018-08-30 DIAGNOSIS — K219 Gastro-esophageal reflux disease without esophagitis: Secondary | ICD-10-CM | POA: Diagnosis present

## 2018-08-30 DIAGNOSIS — E039 Hypothyroidism, unspecified: Secondary | ICD-10-CM | POA: Diagnosis present

## 2018-08-30 HISTORY — DX: Chronic kidney disease, stage 3 unspecified: N18.30

## 2018-08-30 HISTORY — DX: Chronic kidney disease, stage 3 (moderate): N18.3

## 2018-08-30 HISTORY — DX: Chronic diastolic (congestive) heart failure: I50.32

## 2018-08-30 LAB — CBC WITH DIFFERENTIAL/PLATELET
Abs Immature Granulocytes: 0.04 10*3/uL (ref 0.00–0.07)
BASOS ABS: 0 10*3/uL (ref 0.0–0.1)
Basophils Relative: 0 %
Eosinophils Absolute: 0.2 10*3/uL (ref 0.0–0.5)
Eosinophils Relative: 2 %
HCT: 43.5 % (ref 36.0–46.0)
Hemoglobin: 13.5 g/dL (ref 12.0–15.0)
Immature Granulocytes: 1 %
Lymphocytes Relative: 8 %
Lymphs Abs: 0.6 10*3/uL — ABNORMAL LOW (ref 0.7–4.0)
MCH: 28.9 pg (ref 26.0–34.0)
MCHC: 31 g/dL (ref 30.0–36.0)
MCV: 93.1 fL (ref 80.0–100.0)
Monocytes Absolute: 0.9 10*3/uL (ref 0.1–1.0)
Monocytes Relative: 12 %
Neutro Abs: 6.4 10*3/uL (ref 1.7–7.7)
Neutrophils Relative %: 77 %
Platelets: 131 10*3/uL — ABNORMAL LOW (ref 150–400)
RBC: 4.67 MIL/uL (ref 3.87–5.11)
RDW: 13.7 % (ref 11.5–15.5)
WBC: 8.1 10*3/uL (ref 4.0–10.5)
nRBC: 0 % (ref 0.0–0.2)

## 2018-08-30 LAB — COMPREHENSIVE METABOLIC PANEL
ALBUMIN: 3.5 g/dL (ref 3.5–5.0)
ALT: 20 U/L (ref 0–44)
ANION GAP: 8 (ref 5–15)
AST: 28 U/L (ref 15–41)
Alkaline Phosphatase: 40 U/L (ref 38–126)
BUN: 26 mg/dL — ABNORMAL HIGH (ref 8–23)
CALCIUM: 9.2 mg/dL (ref 8.9–10.3)
CO2: 28 mmol/L (ref 22–32)
Chloride: 103 mmol/L (ref 98–111)
Creatinine, Ser: 1.23 mg/dL — ABNORMAL HIGH (ref 0.44–1.00)
GFR calc Af Amer: 45 mL/min — ABNORMAL LOW (ref >60)
GFR calc non Af Amer: 39 mL/min — ABNORMAL LOW (ref >60)
Glucose, Bld: 122 mg/dL — ABNORMAL HIGH (ref 70–99)
Potassium: 3.9 mmol/L (ref 3.5–5.1)
Sodium: 139 mmol/L (ref 135–145)
Total Bilirubin: 0.7 mg/dL (ref 0.3–1.2)
Total Protein: 6.3 g/dL — ABNORMAL LOW (ref 6.5–8.1)

## 2018-08-30 LAB — ECHOCARDIOGRAM COMPLETE
Height: 68 in
Weight: 3840 oz

## 2018-08-30 LAB — TROPONIN I
Troponin I: 0.43 ng/mL (ref <0.03)
Troponin I: 0.78 ng/mL (ref <0.03)
Troponin I: 0.8 ng/mL (ref <0.03)

## 2018-08-30 LAB — CBG MONITORING, ED: GLUCOSE-CAPILLARY: 240 mg/dL — AB (ref 70–99)

## 2018-08-30 LAB — BRAIN NATRIURETIC PEPTIDE: B Natriuretic Peptide: 20.5 pg/mL (ref 0.0–100.0)

## 2018-08-30 MED ORDER — ACETAMINOPHEN 650 MG RE SUPP: 650.0000 mg | Freq: Four times a day (QID) | RECTAL | Status: DC | PRN

## 2018-08-30 MED ORDER — FUROSEMIDE 20 MG PO TABS
20.0000 mg | ORAL_TABLET | Freq: Every day | ORAL | Status: DC
  Administered 2018-08-30 – 2018-09-02 (×4): 20 mg via ORAL
  Filled 2018-08-30 (×4): qty 1

## 2018-08-30 MED ORDER — ASPIRIN 81 MG PO CHEW
324.0000 mg | CHEWABLE_TABLET | Freq: Once | ORAL | Status: AC
  Administered 2018-08-30: 324 mg via ORAL
  Filled 2018-08-30: qty 4

## 2018-08-30 MED ORDER — ONDANSETRON HCL 4 MG PO TABS: 4.0000 mg | ORAL_TABLET | Freq: Four times a day (QID) | ORAL | Status: DC | PRN

## 2018-08-30 MED ORDER — ADULT MULTIVITAMIN W/MINERALS CH
1.0000 | ORAL_TABLET | Freq: Every day | ORAL | Status: DC
  Administered 2018-08-30 – 2018-09-02 (×4): 1 via ORAL
  Filled 2018-08-30 (×4): qty 1

## 2018-08-30 MED ORDER — IOPAMIDOL (ISOVUE-370) INJECTION 76%
100.0000 mL | Freq: Once | INTRAVENOUS | Status: AC | PRN
  Administered 2018-08-30: 80 mL via INTRAVENOUS

## 2018-08-30 MED ORDER — VITAMIN D 25 MCG (1000 UNIT) PO TABS
5000.0000 [IU] | ORAL_TABLET | Freq: Every day | ORAL | Status: DC
  Administered 2018-08-30 – 2018-09-02 (×4): 5000 [IU] via ORAL
  Filled 2018-08-30 (×4): qty 5

## 2018-08-30 MED ORDER — MENTHOL 3 MG MT LOZG
1.0000 | LOZENGE | OROMUCOSAL | Status: DC | PRN
  Filled 2018-08-30: qty 9

## 2018-08-30 MED ORDER — APIXABAN 2.5 MG PO TABS
2.5000 mg | ORAL_TABLET | Freq: Two times a day (BID) | ORAL | Status: DC
  Administered 2018-08-30 – 2018-09-02 (×7): 2.5 mg via ORAL
  Filled 2018-08-30 (×8): qty 1

## 2018-08-30 MED ORDER — ONDANSETRON HCL 4 MG/2ML IJ SOLN: 4.0000 mg | Freq: Four times a day (QID) | INTRAMUSCULAR | Status: DC | PRN

## 2018-08-30 MED ORDER — LISINOPRIL 10 MG PO TABS
10.0000 mg | ORAL_TABLET | Freq: Two times a day (BID) | ORAL | Status: DC
  Administered 2018-08-30 – 2018-09-01 (×6): 10 mg via ORAL
  Filled 2018-08-30 (×6): qty 1

## 2018-08-30 MED ORDER — ONETOUCH ULTRA 2 W/DEVICE KIT: 1.0000 | PACK | Freq: Every day | Status: DC

## 2018-08-30 MED ORDER — METHYLPREDNISOLONE SODIUM SUCC 125 MG IJ SOLR
80.0000 mg | Freq: Three times a day (TID) | INTRAMUSCULAR | Status: DC
  Administered 2018-08-30 – 2018-09-02 (×9): 80 mg via INTRAVENOUS
  Filled 2018-08-30 (×9): qty 2

## 2018-08-30 MED ORDER — TRAMADOL HCL 50 MG PO TABS: 50.0000 mg | ORAL_TABLET | Freq: Four times a day (QID) | ORAL | Status: DC | PRN

## 2018-08-30 MED ORDER — PREDNISONE 20 MG PO TABS
60.0000 mg | ORAL_TABLET | Freq: Once | ORAL | Status: AC
  Administered 2018-08-30: 60 mg via ORAL
  Filled 2018-08-30: qty 3

## 2018-08-30 MED ORDER — CALCIUM CARBONATE-VITAMIN D 500-200 MG-UNIT PO TABS
1.0000 | ORAL_TABLET | Freq: Two times a day (BID) | ORAL | Status: DC
  Administered 2018-08-30 – 2018-09-02 (×7): 1 via ORAL
  Filled 2018-08-30 (×8): qty 1

## 2018-08-30 MED ORDER — POTASSIUM CHLORIDE CRYS ER 20 MEQ PO TBCR
20.0000 meq | EXTENDED_RELEASE_TABLET | Freq: Every day | ORAL | Status: DC
  Administered 2018-08-30 – 2018-09-02 (×4): 20 meq via ORAL
  Filled 2018-08-30 (×4): qty 1

## 2018-08-30 MED ORDER — IPRATROPIUM-ALBUTEROL 0.5-2.5 (3) MG/3ML IN SOLN
3.0000 mL | RESPIRATORY_TRACT | Status: DC
  Administered 2018-08-30 (×6): 3 mL via RESPIRATORY_TRACT
  Filled 2018-08-30 (×6): qty 3

## 2018-08-30 MED ORDER — ROSUVASTATIN CALCIUM 20 MG PO TABS
20.0000 mg | ORAL_TABLET | Freq: Every day | ORAL | Status: DC
  Administered 2018-08-30 – 2018-09-02 (×4): 20 mg via ORAL
  Filled 2018-08-30 (×4): qty 1

## 2018-08-30 MED ORDER — IPRATROPIUM-ALBUTEROL 0.5-2.5 (3) MG/3ML IN SOLN
3.0000 mL | RESPIRATORY_TRACT | Status: DC
  Filled 2018-08-30: qty 3

## 2018-08-30 MED ORDER — IOPAMIDOL (ISOVUE-370) INJECTION 76%
INTRAVENOUS | Status: AC
  Filled 2018-08-30: qty 100

## 2018-08-30 MED ORDER — ASPIRIN EC 81 MG PO TBEC
81.0000 mg | DELAYED_RELEASE_TABLET | Freq: Every day | ORAL | Status: DC
  Administered 2018-08-31 – 2018-09-02 (×3): 81 mg via ORAL
  Filled 2018-08-30 (×3): qty 1

## 2018-08-30 MED ORDER — BUDESONIDE 0.25 MG/2ML IN SUSP: 0.2500 mg | Freq: Two times a day (BID) | RESPIRATORY_TRACT | Status: DC

## 2018-08-30 MED ORDER — INSULIN DETEMIR 100 UNIT/ML ~~LOC~~ SOLN
24.0000 [IU] | Freq: Every day | SUBCUTANEOUS | Status: DC
  Administered 2018-08-30 – 2018-09-02 (×4): 24 [IU] via SUBCUTANEOUS
  Filled 2018-08-30 (×4): qty 0.24

## 2018-08-30 MED ORDER — PANTOPRAZOLE SODIUM 40 MG PO TBEC
40.0000 mg | DELAYED_RELEASE_TABLET | Freq: Two times a day (BID) | ORAL | Status: DC
  Administered 2018-08-30 – 2018-09-02 (×6): 40 mg via ORAL
  Filled 2018-08-30 (×6): qty 1

## 2018-08-30 MED ORDER — LEVOTHYROXINE SODIUM 100 MCG PO TABS
100.0000 ug | ORAL_TABLET | Freq: Every day | ORAL | Status: DC
  Administered 2018-08-30 – 2018-09-02 (×4): 100 ug via ORAL
  Filled 2018-08-30 (×4): qty 1

## 2018-08-30 MED ORDER — DM-GUAIFENESIN ER 30-600 MG PO TB12
2.0000 | ORAL_TABLET | Freq: Two times a day (BID) | ORAL | Status: DC
  Administered 2018-08-30 – 2018-09-02 (×7): 2 via ORAL
  Filled 2018-08-30: qty 2
  Filled 2018-08-30: qty 1
  Filled 2018-08-30 (×6): qty 2

## 2018-08-30 MED ORDER — POLYVINYL ALCOHOL 1.4 % OP SOLN: 1.0000 [drp] | Freq: Three times a day (TID) | OPHTHALMIC | Status: DC | PRN

## 2018-08-30 MED ORDER — SODIUM CHLORIDE (PF) 0.9 % IJ SOLN
INTRAMUSCULAR | Status: AC
  Filled 2018-08-30: qty 50

## 2018-08-30 MED ORDER — IPRATROPIUM BROMIDE 0.02 % IN SOLN: 0.5000 mg | RESPIRATORY_TRACT | Status: DC | PRN

## 2018-08-30 MED ORDER — LEVOFLOXACIN IN D5W 250 MG/50ML IV SOLN
250.0000 mg | INTRAVENOUS | Status: DC
  Filled 2018-08-30: qty 50

## 2018-08-30 MED ORDER — METHYLPREDNISOLONE SODIUM SUCC 40 MG IJ SOLR: 40.0000 mg | Freq: Four times a day (QID) | INTRAMUSCULAR | Status: DC

## 2018-08-30 MED ORDER — DONEPEZIL HCL 10 MG PO TABS
10.0000 mg | ORAL_TABLET | Freq: Every day | ORAL | Status: DC
  Administered 2018-08-30 – 2018-09-01 (×3): 10 mg via ORAL
  Filled 2018-08-30 (×3): qty 1

## 2018-08-30 MED ORDER — ALBUTEROL SULFATE (2.5 MG/3ML) 0.083% IN NEBU: 2.5000 mg | INHALATION_SOLUTION | RESPIRATORY_TRACT | Status: DC | PRN

## 2018-08-30 MED ORDER — ACETAMINOPHEN 325 MG PO TABS: 650.0000 mg | ORAL_TABLET | Freq: Four times a day (QID) | ORAL | Status: DC | PRN

## 2018-08-30 MED ORDER — POLYETHYLENE GLYCOL 3350 17 G PO PACK: 17.0000 g | PACK | Freq: Every day | ORAL | Status: DC | PRN

## 2018-08-30 MED ORDER — LEVOFLOXACIN IN D5W 500 MG/100ML IV SOLN
500.0000 mg | Freq: Once | INTRAVENOUS | Status: AC
  Administered 2018-08-30: 500 mg via INTRAVENOUS
  Filled 2018-08-30: qty 100

## 2018-08-30 NOTE — H&P (Addendum)
[facility] History and Physical  KENYATTE GRUBER FWY:[id number] DOB: 10/31/1927 DOA: 08/30/2018  Referring physician: ED  PCP: Lauree Chandler, NP   Chief Complaint: Acute respiratory distress  HPI: Melissa Shannon is a 85 y.o. female with past medical history of congestive heart failure, diabetes mellitus, hypertension, hyperlipidemia, hypothyroidism, history of pulmonary embolism in the past presented to the hospital with worsening cough and shortness of breath for the last 12 hours.  EMS was called in for this problem and was noted to have low oxygen and was started on oxygen supplementation.  Patient was recently diagnosed of pharyngitis/laryngitis and was conservatively treated for pharyngitis/laryngitis.  Patient has been having sore throat and difficulty swallowing with hoarseness of voice for last 3 to 4 days.  Patient however denies any fever, but has chills without rigors.  She denies any chest pain, palpitation but that shortness of breath.  Patient denies any urinary urgency, frequency or dysuria but is incontinent and he uses diapers.  Denies any nausea, vomiting or abdominal pain.  Denies any recent travel or sick contacts.  Currently uses a walker for ambulation at home and lives by herself.  ED Course: In the ED, patient was noted to have posterior oropharyngeal erythema.  She was noted to be hypoxic with 89% pulse ox on room air subsequently improved to 96% on 2 L.  BNP was within normal limits. troponin done in the ED was elevated at 0.4.  Strep throat swab obtained yesterday was negative.  Pulmonary and cardiology was notified from the ED.  Review of Systems:  All systems were reviewed and were negative unless otherwise mentioned in the HPI  Past Medical History:  Diagnosis Date  . Arthritis    bilateral knees  . CHF (congestive heart failure) ([place])   . Diabetes mellitus without complication ([place])   . Edema   . Hyperlipidemia   . Hypertension   . Hypothyroidism     . Pulmonary embolism ([place])   . Urinary incontinence    Past Surgical History:  Procedure Laterality Date  . BLADDER SURGERY  2000   Dr.Tannerbaum (Bladder Tact)  . CATARACT EXTRACTION  03/2013   Dr.Shapiro  . CHOLECYSTECTOMY    . EYE SURGERY     bilateral cataracts  . [facility]   . right elbow  1999   x 2, still has one piece of metal in it  . TONSILLECTOMY AND ADENOIDECTOMY  1937   Dr.Brewer    Social History:  reports that she has never smoked. She has never used smokeless tobacco. She reports that she does not drink alcohol or use drugs.  Allergies  Allergen Reactions  . Penicillins Other (See Comments)    DID THE REACTION INVOLVE: Swelling of the face/tongue/throat, SOB, or low BP? Yes Sudden or severe rash/hives, skin peeling, or the inside of the mouth or nose? No Did it require medical treatment? No When did it last happen? If all above answers are "NO", may proceed with cephalosporin use.   Throat felt tight    Family History  Problem Relation Age of Onset  . Suicidality Father   . Cancer Brother   . Cancer Maternal Grandfather   . Diabetes Maternal Grandmother   . Melanoma Brother      Prior to Admission medications   Medication Sig Start Date End Date Taking? Authorizing Provider  alendronate (FOSAMAX) 70 MG tablet TAKE 1 TABLET BY MOUTH ONCE WEEKLY TAKE WITH FULL GLASS  OF WATER ON AN EMPTY STOMACH Patient taking differently: Take 70 mg by mouth every Wednesday.  03/26/18  Yes Lauree Chandler, NP  Calcium Carbonate-Vitamin D (CALTRATE 600+D) 600-400 MG-UNIT tablet Take 1 tablet by mouth 2 (two) times daily.   Yes [provider]  Cholecalciferol (VITAMIN D3) 5000 UNITS CHEW Chew 5,000 Units by mouth daily.    Yes [provider]  donepezil (ARICEPT) 10 MG tablet TAKE ONE TABLET BY MOUTH ONCE DAILY AT BEDTIME TO PRESERVE MEMORY Patient taking differently: Take 10 mg by mouth at bedtime. Take  one tablet by mouth once daily at bedtime to preserve memory 05/07/18  Yes Eubanks, Carlos American, NP  ELIQUIS 5 MG TABS tablet TAKE 1 TABLET BY MOUTH TWICE A DAY Patient taking differently: Take 2.5 mg by mouth 2 (two) times daily.  04/30/18  Yes Lauree Chandler, NP  feeding supplement, ENSURE COMPLETE, (ENSURE COMPLETE) LIQD Take 237 mLs by mouth 2 (two) times daily between meals as needed (As requested by patient). Patient taking differently: Take 237 mLs by mouth daily.  06/19/14  Yes Regalado, Belkys A, MD  furosemide (LASIX) 20 MG tablet Take 0.5 tablets (10 mg total) by mouth daily. Patient taking differently: Take 10 mg by mouth 2 (two) times daily.  02/20/17  Yes Larey Dresser, MD  hydroxypropyl methylcellulose / hypromellose (ISOPTO TEARS / GONIOVISC) 2.5 % ophthalmic solution Place 1 drop into both eyes 3 (three) times daily as needed for dry eyes.   Yes [provider]  LEVEMIR FLEXTOUCH 100 UNIT/ML Pen INJECT 24 UNITS INTO THE SKIN DAILY. Patient taking differently: Inject 24 Units into the skin daily.  06/11/18  Yes Lauree Chandler, NP  levothyroxine (SYNTHROID, LEVOTHROID) 100 MCG tablet TAKE 1 TABLET BY MOUTH EVERY DAY BEFORE BREAKFAST Patient taking differently: Take 100 mcg by mouth daily before breakfast.  05/31/18  Yes Eubanks, Carlos American, NP  lisinopril (PRINIVIL,ZESTRIL) 10 MG tablet TAKE 1 TABLET BY MOUTH TWICE A DAY Patient taking differently: Take 10 mg by mouth 2 (two) times daily.  06/06/18  Yes Larey Dresser, MD  Multiple Vitamin (MULTIVITAMIN WITH MINERALS) TABS tablet Take 1 tablet by mouth daily. 06/19/14  Yes Regalado, Belkys A, MD  rosuvastatin (CRESTOR) 20 MG tablet TAKE 1 TABLET EVERY DAY FOR CHOLESTEROL Patient taking differently: Take 20 mg by mouth daily.  05/24/18  Yes Eubanks, Carlos American, NP  TRADJENTA 5 MG TABS tablet TAKE 1 TABLET BY MOUTH EVERY DAY Patient taking differently: Take 5 mg by mouth daily.  06/04/18  Yes Lauree Chandler, NP  BD PEN  NEEDLE NANO U/F 32G X 4 MM MISC USE AS DIRECTED ONCE DAILY 02/26/18   Lauree Chandler, NP  Blood Glucose Monitoring Suppl (ONE TOUCH ULTRA 2) w/Device KIT 1 Device by Does not apply route daily. Dx: E11.22 02/05/18   Lauree Chandler, NP  ONE TOUCH ULTRA TEST test strip Use every other day to test blood sugar 11/04/15   Gildardo Cranker, DO  [facility] DELICA LANCETS 48J MISC Use every other day to test blood sugar. Dx: E11.22 12/27/16   Lauree Chandler, NP    Physical Exam: Vitals:   08/30/18 0352 08/30/18 0400 08/30/18 0601 08/30/18 0700  BP: (!) 150/66 (!) 157/74 137/65 (!) 155/60  Pulse: 92 94 84 77  Resp: 20 (!) 21  (!) 23  Temp:   98 F (36.7 C)   TempSrc:   Oral   SpO2: 96% 93% 97% 98%  Weight:  Height:       Wt Readings from Last 3 Encounters:  08/30/18 108.9 kg  08/29/18 108.9 kg  05/28/18 101.2 kg   Body mass index is 36.49 kg/m.  General:  Average built, not in obvious distress.  On nasal cannula oxygen HENT: Normocephalic, pupils equally reacting to light and accommodation.  No scleral pallor or icterus noted. Oral mucosa is moist.  Pharyngeal erythema and congestion noted Chest:  Clear breath sounds.  Diminished breath sounds bilaterally.  No definite wheezes noted at the time of my examination CVS: S1 &S2 heard. No murmur.  Regular rate and rhythm. Abdomen: Soft, nontender, nondistended.  Bowel sounds are heard.  Liver is not palpable, no abdominal mass palpated Extremities: No cyanosis, clubbing but bilateral lower extremity trace pedal edema, peripheral pulses are palpable. Psych: Alert, awake and oriented, normal mood CNS:  No cranial nerve deficits.  Power equal in all extremities.   No cerebellar signs.   Skin: Warm and dry.  No rashes noted.  Labs on Admission:  Basic Metabolic Panel: Recent Labs  Lab 08/30/18 0224  NA 139  K 3.9  CL 103  CO2 28  GLUCOSE 122*  BUN 26*  CREATININE 1.23*  CALCIUM 9.2   Liver Function Tests: Recent Labs  Lab  08/30/18 0224  AST 28  ALT 20  ALKPHOS 40  BILITOT 0.7  PROT 6.3*  ALBUMIN 3.5   No results for input(s): LIPASE, AMYLASE in the last 168 hours. No results for input(s): AMMONIA in the last 168 hours. CBC: Recent Labs  Lab 08/30/18 0224  WBC 8.1  NEUTROABS 6.4  HGB 13.5  HCT 43.5  MCV 93.1  PLT 131*   Cardiac Enzymes: Recent Labs  Lab 08/30/18 0224  TROPONINI 0.43*    BNP (last 3 results) Recent Labs    08/30/18 0224  BNP 20.5    ProBNP (last 3 results) Recent Labs    02/27/18 1528  PROBNP 34.0    CBG: No results for input(s): GLUCAP in the last 168 hours.   Radiological Exams on Admission: Dg Chest 2 View  Result Date: 08/30/2018 CLINICAL DATA:  Cough, chest congestion EXAM: CHEST - 2 VIEW COMPARISON:  01/07/2015 FINDINGS: Lungs are clear.  No pleural effusion or pneumothorax. The heart is normal in size. Degenerative changes of the visualized thoracolumbar spine. IMPRESSION: Normal chest radiographs. Electronically Signed   By: Julian Hy M.D.   On: 08/30/2018 02:33   Ct Angio Chest Pe W And/or Wo Contrast  Result Date: 08/30/2018 CLINICAL DATA:  Shortness of breath. EXAM: CT ANGIOGRAPHY CHEST WITH CONTRAST TECHNIQUE: Multidetector CT imaging of the chest was performed using the standard protocol during bolus administration of intravenous contrast. Multiplanar CT image reconstructions and MIPs were obtained to evaluate the vascular anatomy. CONTRAST:  76m ISOVUE-370 IOPAMIDOL (ISOVUE-370) INJECTION 76% COMPARISON:  Radiography same day. CT 09/11/2014. FINDINGS: Cardiovascular: Pulmonary arterial opacification is good. There are no pulmonary emboli. The heart is at the upper limits of normal in size. There is some coronary artery calcification. There is aortic atherosclerosis without aneurysm or dissection Mediastinum/Nodes: No mediastinal mass or lymphadenopathy. Lungs/Pleura: No pleural effusion. There is pronounced tracheal narrowing which could be  due to tracheomalacia and or tracheobronchitis. Major bronchi are also narrowed with apparent wall thickening. Particular narrowing of the lower lobe bronchi right more than left. Atelectasis at the posterior inferior right lower lobe. Chronic bronchiectasis and or bronchial atresia in the right middle lobe as seen previously. Chronic small benign nodules in  the lingula as seen previously. No upper lobe parenchymal finding other than a 3 mm nodule in the right upper lobe image 23 measuring 2.5 mm, likely to be benign. No underlying emphysema. Upper Abdomen: Chronic cyst in the left lobe of the liver. No significant upper abdominal finding. Patient does have a small hiatal hernia. Musculoskeletal: Ordinary spondylosis. Review of the MIP images confirms the above findings. IMPRESSION: 1. No pulmonary emboli. 2. Pronounced tracheal narrowing which could be due to tracheomalacia and/or tracheobronchitis. Narrowing and wall thickening also of the main bronchial branches, particularly in the right lower lobe. Partial atelectasis of the posterior inferior right lower lobe. 3. Chronic bronchiectasis and or bronchial atresia in the right middle lobe. 4. Coronary artery calcification. 5. Small hiatal hernia. Aortic Atherosclerosis (ICD10-I70.0). Electronically Signed   By: Nelson Chimes M.D.   On: 08/30/2018 06:35    EKG: Personally reviewed by me which shows normal sinus rhythm  Assessment/Plan  Principal Problem:   Dyspnea Active Problems:   Hypertension   Hyperlipemia   Acute respiratory failure with hypoxia (HCC)   Diabetes mellitus with renal manifestations, uncontrolled (HCC)   Chronic diastolic CHF (congestive heart failure) (HCC)   Acute dyspnea, shortness of breath with acute hypoxic respiratory failure.  CT scan of the chest was negative for pulmonary embolism but pronounced tracheal narrowing, bronchial thickening possibility of tracheomalacia/tracheobronchitis.  Chronic bronchiectasis and bronchial  atresia noted in the right middle lobe.  Patient was given nebulizers and supplemental oxygen with improvement in her oxygenation.  Pulmonary has been consulted.  We will continue on oxygen nebulizer, IV steroids and empiric Levaquin for possible underlying bronchitis.  Continue incentive spirometry, flutter valve and pulmonary hygiene therapy.    Chronic diastolic congestive heart failure.  BNP within normal limits.  She takes Lasix as outpatient.  Continue ACE inhibitor's.  We will continue Lasix while in the hospital  Chronic kidney disease stage IV.  Appears to be at baseline.  Last creatinine of 1.4- 66-monthback.  Will closely monitor during hospitalization   Elevated troponin without chest pain.  No acute ischemic changes on EKG.  Cardiology was consulted.  Will follow cardiology recommendations.  Add aspirin to the regimen.  Continue lisinopril statins.  Check 2D echocardiogram for wall motion abnormality  History of pulmonary embolism.  Patient is on anticoagulation with Eliquis.  We will continue with that.  Diabetes mellitus type 2.  We will put the patient on sliding scale insulin, Accu-Cheks, diabetic diet.  We will closely monitor.  Resume long-acting insulin.  History of mild dementia.  Continue donepezil  Hypertension.  Will closely monitor.  Resume outpatient medication.  On lisinopril  Hyperlipidemia. Continue Statins.   Hypothyroidism.  Continue Synthroid.     Consultant: Pulmonary Dr WMelvyn Novasand cardiology Dr COrene Desanctisconsulted from the ED  Code Status: Limited code-no CPR but okay with intubation if needed for her breathing issue  DVT Prophylaxis: Eliquis  Antibiotics: Levaquin IV  Family Communication:  Patients' condition and plan of care including tests being ordered have been discussed with the patient and patient's daughter who indicate understanding and agree with the plan.  Disposition Plan: Home with home health  Severity of Illness: The appropriate  patient status for this patient is OBSERVATION. Observation status is judged to be reasonable and necessary in order to provide the required intensity of service to ensure the patient's safety. The patient's presenting symptoms, physical exam findings, and initial radiographic and laboratory data in the context of their medical condition  is felt to place them at decreased risk for further clinical deterioration. Furthermore, it is anticipated that the patient will be medically stable for discharge from the hospital within 2 midnights of admission.  Signed, Flora Lipps, MD [facility] 08/30/2018

## 2018-08-30 NOTE — ED Notes (Signed)
Bed: AJ51 Expected date:  Expected time:  Means of arrival:  Comments: 85 yo F/shortness of breath

## 2018-08-30 NOTE — Consult Note (Addendum)
Cardiology Consultation:   Patient ID: [name]; [id number]; 08-Jun-1928   Admit date: 08/30/2018 Date of Consult: 08/30/2018  Primary Care Provider: Lauree Chandler, NP Primary Cardiologist:  Loralie Champagne, MD  05/11/2018 Primary Electrophysiologist:  None   Patient Profile:   Melissa Shannon is a 85 y.o. female with a hx of DM, HTN, HLD, OA, R heart failure, hypothyroid, PE 2015, who is being seen today for the evaluation of elevated troponin at the request of Dr Dayna Barker.  History of Present Illness:   [name] came to the ER 01/15 for a sore throat, dx viral laryngitis>>d/c [name] came to the ER 01/16 at approx 1 am for SOB, cough, was found to be hypoxic. Troponin was checked and was elevated, cards asked to see.   05/11/2018 office visit, pt w/ stable exertional dyspnea, RV had recovered after PE, volume good, wt 225. Continue low-dose Lasix and Eliquis.  Today, BNP was normal and CT w/ bronchiectasis and tracheal narrowing>>nebs, O2, steroids and ABX started, Pulm to see. No CHF.   Pt has ASA ordered, has rec'd nebs, prednisone 60 mg. Has ABX ordered.   She has chronic LE edema, no recent change, no daily wts . Has help in the home, they cook low sodium foods according to her daughter.  Her activity level is low chronically, limited by musculoskeletal issues.  She has significant deconditioning.  Has a split-level with a stair chair to the upper story where her bedroom is and an additional 7 steps in her home.  She climbed those this week w/out chest pain or SOB.   She feels that she was starting to get short of breath yesterday when she went to the emergency room.  Later, her daughter talked to her on the phone and she was very hoarse.  She called her daughter back just before midnight, and was worse with noticeable shortness of breath, could barely talk.  Currently, she is breathing a bit better, but not back to baseline.  She has rhonchorous lung sounds, but does  not have a very strong cough and says she is not able to cough anything up.  She has not had fevers or chills.  She has not had any chest pain.  No palpitations, no presyncope or syncope.   Past Medical History:  Diagnosis Date  . Arthritis    bilateral knees  . Chronic diastolic CHF (congestive heart failure) ([place])   . CKD (chronic kidney disease), stage III ([place])   . Diabetes mellitus without complication ([place])   . Edema   . Hyperlipidemia   . Hypertension   . Hypothyroidism   . Pulmonary embolism ([place])   . Urinary incontinence     Past Surgical History:  Procedure Laterality Date  . BLADDER SURGERY  2000   Dr.Tannerbaum (Bladder Tact)  . CATARACT EXTRACTION  03/2013   Dr.Shapiro  . CHOLECYSTECTOMY    . EYE SURGERY     bilateral cataracts  . [facility]   . right elbow  1999   x 2, still has one piece of metal in it  . TONSILLECTOMY AND ADENOIDECTOMY  1937   Dr.Brewer     Prior to Admission medications   Medication Sig Start Date End Date Taking? Authorizing Provider  alendronate (FOSAMAX) 70 MG tablet TAKE 1 TABLET BY MOUTH ONCE WEEKLY TAKE WITH FULL GLASS OF WATER ON AN EMPTY STOMACH Patient taking differently: Take 70 mg by mouth every Wednesday.  03/26/18  Yes Lauree Chandler, NP  Calcium Carbonate-Vitamin D (CALTRATE 600+D) 600-400 MG-UNIT tablet Take 1 tablet by mouth 2 (two) times daily.   Yes [provider]  Cholecalciferol (VITAMIN D3) 5000 UNITS CHEW Chew 5,000 Units by mouth daily.    Yes [provider]  donepezil (ARICEPT) 10 MG tablet TAKE ONE TABLET BY MOUTH ONCE DAILY AT BEDTIME TO PRESERVE MEMORY Patient taking differently: Take 10 mg by mouth at bedtime. Take one tablet by mouth once daily at bedtime to preserve memory 05/07/18  Yes Eubanks, Carlos American, NP  ELIQUIS 5 MG TABS tablet TAKE 1 TABLET BY MOUTH TWICE A DAY Patient taking differently: Take 2.5 mg by mouth 2 (two) times daily.  04/30/18   Yes Lauree Chandler, NP  feeding supplement, ENSURE COMPLETE, (ENSURE COMPLETE) LIQD Take 237 mLs by mouth 2 (two) times daily between meals as needed (As requested by patient). Patient taking differently: Take 237 mLs by mouth daily.  06/19/14  Yes Regalado, Belkys A, MD  furosemide (LASIX) 20 MG tablet Take 0.5 tablets (10 mg total) by mouth daily. Patient taking differently: Take 10 mg by mouth 2 (two) times daily.  02/20/17  Yes Larey Dresser, MD  hydroxypropyl methylcellulose / hypromellose (ISOPTO TEARS / GONIOVISC) 2.5 % ophthalmic solution Place 1 drop into both eyes 3 (three) times daily as needed for dry eyes.   Yes [provider]  LEVEMIR FLEXTOUCH 100 UNIT/ML Pen INJECT 24 UNITS INTO THE SKIN DAILY. Patient taking differently: Inject 24 Units into the skin daily.  06/11/18  Yes Lauree Chandler, NP  levothyroxine (SYNTHROID, LEVOTHROID) 100 MCG tablet TAKE 1 TABLET BY MOUTH EVERY DAY BEFORE BREAKFAST Patient taking differently: Take 100 mcg by mouth daily before breakfast.  05/31/18  Yes Eubanks, Carlos American, NP  lisinopril (PRINIVIL,ZESTRIL) 10 MG tablet TAKE 1 TABLET BY MOUTH TWICE A DAY Patient taking differently: Take 10 mg by mouth 2 (two) times daily.  06/06/18  Yes Larey Dresser, MD  Multiple Vitamin (MULTIVITAMIN WITH MINERALS) TABS tablet Take 1 tablet by mouth daily. 06/19/14  Yes Regalado, Belkys A, MD  rosuvastatin (CRESTOR) 20 MG tablet TAKE 1 TABLET EVERY DAY FOR CHOLESTEROL Patient taking differently: Take 20 mg by mouth daily.  05/24/18  Yes Eubanks, Carlos American, NP  TRADJENTA 5 MG TABS tablet TAKE 1 TABLET BY MOUTH EVERY DAY Patient taking differently: Take 5 mg by mouth daily.  06/04/18  Yes Lauree Chandler, NP  BD PEN NEEDLE NANO U/F 32G X 4 MM MISC USE AS DIRECTED ONCE DAILY 02/26/18   Lauree Chandler, NP  Blood Glucose Monitoring Suppl (ONE TOUCH ULTRA 2) w/Device KIT 1 Device by Does not apply route daily. Dx: E11.22 02/05/18   Lauree Chandler,  NP  ONE TOUCH ULTRA TEST test strip Use every other day to test blood sugar 11/04/15   Gildardo Cranker, DO  [facility] DELICA LANCETS 07M MISC Use every other day to test blood sugar. Dx: E11.22 12/27/16   Lauree Chandler, NP    Inpatient Medications: Scheduled Meds: . aspirin  324 mg Oral Once  . [START ON 08/31/2018] aspirin EC  81 mg Oral Daily  . furosemide  20 mg Oral Daily  . iopamidol      . ipratropium-albuterol  3 mL Nebulization Q4H  . methylPREDNISolone (SOLU-MEDROL) injection  80 mg Intravenous Q8H  . pantoprazole  40 mg Oral BID AC  . potassium chloride  20 mEq Oral Daily  .  sodium chloride (PF)       Continuous Infusions: . [START ON 08/31/2018] levofloxacin (LEVAQUIN) IV    . levofloxacin (LEVAQUIN) IV     PRN Meds: menthol-cetylpyridinium  Allergies:    Allergies  Allergen Reactions  . Penicillins Other (See Comments)    DID THE REACTION INVOLVE: Swelling of the face/tongue/throat, SOB, or low BP? Yes Sudden or severe rash/hives, skin peeling, or the inside of the mouth or nose? No Did it require medical treatment? No When did it last happen? If all above answers are "NO", may proceed with cephalosporin use.   Throat felt tight    Social History:   Social History   Socioeconomic History  . Marital status: Widowed    Spouse name: Not on file  . Number of children: Not on file  . Years of education: Not on file  . Highest education level: Not on file  Occupational History  . Occupation: Retired Tour manager  . Financial resource strain: Not hard at all  . Food insecurity:    Worry: Never true    Inability: Never true  . Transportation needs:    Medical: No    Non-medical: No  Tobacco Use  . Smoking status: Never Smoker  . Smokeless tobacco: Never Used  Substance and Sexual Activity  . Alcohol use: No  . Drug use: No  . Sexual activity: Never  Lifestyle  . Physical activity:    Days per week: 7 days    Minutes per session: 20  min  . Stress: Not at all  Relationships  . Social connections:    Talks on phone: Three times a week    Gets together: Once a week    Attends religious service: Never    Active member of club or organization: No    Attends meetings of clubs or organizations: Never    Relationship status: Widowed  . Intimate partner violence:    Fear of current or ex partner: No    Emotionally abused: No    Physically abused: No    Forced sexual activity: No  Other Topics Concern  . Not on file  Social History Narrative   Diet: Low sodium    Do you drink/eat things with caffeine? Yes, tea   Widowed, married 1952   Lives in a house, yes- 1 or more stories, 1 person in home. No pets    Current/past profession- Teacher    Patient exercises with walker twice daily         Family History:   Family History  Problem Relation Age of Onset  . Suicidality Father   . Cancer Brother   . Cancer Maternal Grandfather   . Diabetes Maternal Grandmother   . Melanoma Brother    Family Status:  Family Status  Relation Name Status  . Father Marjory Lies  Deceased at age 49  . Brother Alferd Apa Deceased at age 70       cancer-unknown source  . Mother Ophelia Deceased at age 65       unknown  . Daughter [employer]  . Son Reeves Forth  . Son [employer]  . MGF  (Not Specified)  . MGM  (Not Specified)  . Brother  (Not Specified)    ROS:  Please see the history of present illness.  All other ROS reviewed and negative.     Physical Exam/Data:   Vitals:   08/30/18 0400 08/30/18 0601 08/30/18 0700 08/30/18 0800  BP: (!) 157/74 137/65 Marland Kitchen)  155/60   Pulse: 94 84 77   Resp: (!) 21  (!) 23   Temp:  98 F (36.7 C)    TempSrc:  Oral    SpO2: 93% 97% 98% 93%  Weight:      Height:       No intake or output data in the 24 hours ending 08/30/18 0916 Filed Weights   08/30/18 0130  Weight: 108.9 kg   Body mass index is 36.49 kg/m.  General:  Well nourished, well developed, in no acute distress HEENT:  normal Lymph: no adenopathy Neck: Minimal JVD Endocrine:  No thryomegaly Vascular: No carotid bruits; 4/4 extremity pulses 2+, without bruits  Cardiac:  normal S1, S2; RRR; no murmur appreciated, but lung sounds interfere with auscultation Lungs: Rhonchorous lung sounds bilaterally, no wheezing, wet cough that is nonproductive Abd: soft, nontender, no hepatomegaly  Ext: no edema Musculoskeletal:  No deformities, BUE and BLE strength normal and equal Skin: warm and dry  Neuro:  CNs 2-12 intact, no focal abnormalities noted Psych:  Normal affect   EKG:  The EKG was personally reviewed and demonstrates: 08/30/2018, sinus rhythm, heart rate 84, no acute ischemic changes and no pathologic Q waves.  No significant change from 05/11/2018 Telemetry:  Telemetry was personally reviewed and demonstrates: Sinus rhythm  Relevant CV Studies:  ECHO: Ordered  Echo: 10/16/2014 - Left ventricle: The cavity size was normal. Wall thickness was normal. Systolic function was normal. The estimated ejection fraction was in the range of 55% to 60%. Basal inferior hypokinesis. Doppler parameters are consistent with abnormal left ventricular relaxation (grade 1 diastolic dysfunction). - Aortic valve: Sclerosis without stenosis. - Mitral valve: Mildly calcified annulus. There was trivial regurgitation. - Tricuspid valve: Peak RV-RA gradient (S): 20 mm Hg. - Pulmonary arteries: PA peak pressure: 28 mm Hg (S). - Systemic veins: IVC measured 2.4 cm with > 50% respirophasic variation, suggesting RA pressure 8 mmHg.  Impressions:  - Normal LV size and systolic function, EF 45-80%. Basal inferior hypokinesis. Normal RV size and systolic function. PA systolic pressure 28 mmHg.  Laboratory Data:  Chemistry Recent Labs  Lab 08/30/18 0224  NA 139  K 3.9  CL 103  CO2 28  GLUCOSE 122*  BUN 26*  CREATININE 1.23*  CALCIUM 9.2  GFRNONAA 39*  GFRAA 45*  ANIONGAP 8    Lab Results   Component Value Date   ALT 20 08/30/2018   AST 28 08/30/2018   ALKPHOS 40 08/30/2018   BILITOT 0.7 08/30/2018   Hematology Recent Labs  Lab 08/30/18 0224  WBC 8.1  RBC 4.67  HGB 13.5  HCT 43.5  MCV 93.1  MCH 28.9  MCHC 31.0  RDW 13.7  PLT 131*   Cardiac Enzymes Recent Labs  Lab 08/30/18 0224  TROPONINI 0.43*     BNP Recent Labs  Lab 08/30/18 0224  BNP 20.5    TSH:  Lab Results  Component Value Date   TSH 0.63 02/23/2018   Lipids: Lab Results  Component Value Date   CHOL 166 02/23/2018   HDL 64 02/23/2018   LDLCALC 77 02/23/2018   TRIG 151 (H) 02/23/2018   CHOLHDL 2.6 02/23/2018   HgbA1c: Lab Results  Component Value Date   HGBA1C 6.8 (H) 05/28/2018    Radiology/Studies:  Dg Chest 2 View  Result Date: 08/30/2018 CLINICAL DATA:  Cough, chest congestion EXAM: CHEST - 2 VIEW COMPARISON:  01/07/2015 FINDINGS: Lungs are clear.  No pleural effusion or pneumothorax. The heart is normal  in size. Degenerative changes of the visualized thoracolumbar spine. IMPRESSION: Normal chest radiographs. Electronically Signed   By: Julian Hy M.D.   On: 08/30/2018 02:33   Ct Angio Chest Pe W And/or Wo Contrast  Result Date: 08/30/2018 CLINICAL DATA:  Shortness of breath. EXAM: CT ANGIOGRAPHY CHEST WITH CONTRAST TECHNIQUE: Multidetector CT imaging of the chest was performed using the standard protocol during bolus administration of intravenous contrast. Multiplanar CT image reconstructions and MIPs were obtained to evaluate the vascular anatomy. CONTRAST:  63m ISOVUE-370 IOPAMIDOL (ISOVUE-370) INJECTION 76% COMPARISON:  Radiography same day. CT 09/11/2014. FINDINGS: Cardiovascular: Pulmonary arterial opacification is good. There are no pulmonary emboli. The heart is at the upper limits of normal in size. There is some coronary artery calcification. There is aortic atherosclerosis without aneurysm or dissection Mediastinum/Nodes: No mediastinal mass or lymphadenopathy.  Lungs/Pleura: No pleural effusion. There is pronounced tracheal narrowing which could be due to tracheomalacia and or tracheobronchitis. Major bronchi are also narrowed with apparent wall thickening. Particular narrowing of the lower lobe bronchi right more than left. Atelectasis at the posterior inferior right lower lobe. Chronic bronchiectasis and or bronchial atresia in the right middle lobe as seen previously. Chronic small benign nodules in the lingula as seen previously. No upper lobe parenchymal finding other than a 3 mm nodule in the right upper lobe image 23 measuring 2.5 mm, likely to be benign. No underlying emphysema. Upper Abdomen: Chronic cyst in the left lobe of the liver. No significant upper abdominal finding. Patient does have a small hiatal hernia. Musculoskeletal: Ordinary spondylosis. Review of the MIP images confirms the above findings. IMPRESSION: 1. No pulmonary emboli. 2. Pronounced tracheal narrowing which could be due to tracheomalacia and/or tracheobronchitis. Narrowing and wall thickening also of the main bronchial branches, particularly in the right lower lobe. Partial atelectasis of the posterior inferior right lower lobe. 3. Chronic bronchiectasis and or bronchial atresia in the right middle lobe. 4. Coronary artery calcification. 5. Small hiatal hernia. Aortic Atherosclerosis (ICD10-I70.0). Electronically Signed   By: MNelson ChimesM.D.   On: 08/30/2018 06:35    Assessment and Plan:   1.  Elevated troponin: -She had a troponin of 0.66 in 2015 in the setting of a PE.  However, CT this admission is negative for PE. - She has had no chest pain.  No exertional symptoms, but her exertional level is very low - Agree with continuing to cycle enzymes and check an echo - There was some coronary artery calcification on her CT. - If her troponin level remains flat or trends down, suspect the elevation is due to her acute respiratory failure, demand ischemia - If her troponin has a  crescendo decrescendo pattern, discuss ischemic evaluation with MD  Otherwise, per IM Principal Problem:   Acute respiratory failure with hypoxia ([place] Active Problems:   Hypertension   Hyperlipemia   Diabetes mellitus with renal manifestations, uncontrolled ([facility])   Chronic diastolic CHF (congestive heart failure) ([facility])   Dyspnea   CKD (chronic kidney disease), stage III [place]     For questions or updates, please contact [facility] Please consult [email or web address] for contact info under Cardiology/STEMI.   Signed, RRosaria Ferries PA-C  08/30/2018 9:16 AM  I have seen and examined the patient along with RRosaria Ferries PA-C .  I have reviewed the chart, notes and new data.  I agree with PA's note.  Key new complaints: No clear complaints of any type of chest discomfort that I can elicit.  She has a  cough and shortness of breath. Key examination changes: She has bilateral rhonchi and stridor, no active wheezes or crackles.  Normal cardiovascular exam. Key new findings / data: Normal ECG.  Chest x-ray without heart failure.  Mild elevation in cardiac troponin.  Very slight increase in cardiac troponin on repeat assay from 0.43-0.78.  Moderate chronic kidney disease with a GFR around 40.  PLAN: The exact reason for her troponin elevation is not immediately obvious, but I doubt that she has acute coronary insufficiency.  Both her symptom complex and the ECG are not consistent with an acute cardiac event.  We will check an echocardiogram to look for evidence of regional wall motion abnormalities and repeat 1 more troponin assay.  Unless there is distinct left ventricular dysfunction or regional LV wall motion abnormality, I do not think we will pursue additional cardiac work-up.  Sanda Klein, MD, [facility] [phone] 08/30/2018, 2:31 PM

## 2018-08-30 NOTE — ED Notes (Signed)
ED TO INPATIENT HANDOFF REPORT  Name/Age/Gender Melissa Shannon 85 y.o. female  Code Status    Code Status Orders  (From admission, onward)         Start     Ordered   08/30/18 0931  Full code  Continuous     08/30/18 0931        Code Status History    Date Active Date Inactive Code Status Order ID Comments User Context   08/30/2018 0825 08/30/2018 0931 Partial Code [id number]  Flora Lipps, MD ED   02/20/2017 1116 10/26/2017 1542 DNR [id number]  Lauree Chandler, NP Outpatient   11/29/2014 0054 11/30/2014 1529 DNR [id number]  Lavina Hamman, MD Inpatient   06/11/2014 2142 06/19/2014 1629 Full Code [id number]  Allyne Gee, MD Inpatient    Advance Directive Documentation     Most Recent Value  Type of Advance Directive  Living will  Pre-existing out of facility DNR order (yellow form or pink MOST form)  -  "MOST" Form in Place?  -      Home/SNF/Other Home  Chief Complaint Shortness of breath  Level of Care/Admitting Diagnosis ED Disposition    ED Disposition Condition [place]: [place] [[id number]]  Level of Care: Telemetry [5]  Admit to tele based on following criteria: Acute CHF  Diagnosis: Dyspnea [440102]  Admitting Physician: Flora Lipps [[id number]]  Attending Physician: Flora Lipps [[id number]]  PT Class (Do Not Modify): Observation [104]  PT Acc Code (Do Not Modify): Observation [10022]       Medical History Past Medical History:  Diagnosis Date  . Arthritis    bilateral knees  . Chronic diastolic CHF (congestive heart failure) ([place])   . CKD (chronic kidney disease), stage III ([place])   . Diabetes mellitus without complication ([place])   . Edema   . Hyperlipidemia   . Hypertension   . Hypothyroidism   . Pulmonary embolism ([place])   . Urinary incontinence     Allergies Allergies  Allergen Reactions  . Penicillins Other (See Comments)    DID THE REACTION INVOLVE: Swelling of the face/tongue/throat, SOB,  or low BP? Yes Sudden or severe rash/hives, skin peeling, or the inside of the mouth or nose? No Did it require medical treatment? No When did it last happen? If all above answers are "NO", may proceed with cephalosporin use.   Throat felt tight    IV Location/Drains/Wounds Patient Lines/Drains/Airways Status   Active Line/Drains/Airways    Name:   Placement date:   Placement time:   Site:   Days:   Peripheral IV 08/30/18 Right;Anterior Forearm   08/30/18    0227    Forearm   less than 1   Peripheral IV 08/30/18 Right;Upper Arm   08/30/18    0502    Arm   less than 1   External Urinary Catheter   08/30/18    0945    -   less than 1          Labs/Imaging Results for orders placed or performed during the hospital encounter of 08/30/18 (from the past 48 hour(s))  CBC with Differential     Status: Abnormal   Collection Time: 08/30/18  2:24 AM  Result Value Ref Range   WBC 8.1 4.0 - 10.5 K/uL   RBC 4.67 3.87 - 5.11 MIL/uL   Hemoglobin 13.5 12.0 - 15.0 g/dL   HCT 43.5 36.0 - 46.0 %   MCV 93.1 80.0 -  100.0 fL   MCH 28.9 26.0 - 34.0 pg   MCHC 31.0 30.0 - 36.0 g/dL   RDW 13.7 11.5 - 15.5 %   Platelets 131 (L) 150 - 400 K/uL   nRBC 0.0 0.0 - 0.2 %   Neutrophils Relative % 77 %   Neutro Abs 6.4 1.7 - 7.7 K/uL   Lymphocytes Relative 8 %   Lymphs Abs 0.6 (L) 0.7 - 4.0 K/uL   Monocytes Relative 12 %   Monocytes Absolute 0.9 0.1 - 1.0 K/uL   Eosinophils Relative 2 %   Eosinophils Absolute 0.2 0.0 - 0.5 K/uL   Basophils Relative 0 %   Basophils Absolute 0.0 0.0 - 0.1 K/uL   Immature Granulocytes 1 %   Abs Immature Granulocytes 0.04 0.00 - 0.07 K/uL    Comment: Performed at [facility], [place] [address] [place], [place] [address]  Comprehensive metabolic panel     Status: Abnormal   Collection Time: 08/30/18  2:24 AM  Result Value Ref Range   Sodium 139 135 - 145 mmol/L   Potassium 3.9 3.5 - 5.1 mmol/L   Chloride 103 98 - 111 mmol/L   CO2 28 22 - 32  mmol/L   Glucose, Bld 122 (H) 70 - 99 mg/dL   BUN 26 (H) 8 - 23 mg/dL   Creatinine, Ser 1.23 (H) 0.44 - 1.00 mg/dL   Calcium 9.2 8.9 - 10.3 mg/dL   Total Protein 6.3 (L) 6.5 - 8.1 g/dL   Albumin 3.5 3.5 - 5.0 g/dL   AST 28 15 - 41 U/L   ALT 20 0 - 44 U/L   Alkaline Phosphatase 40 38 - 126 U/L   Total Bilirubin 0.7 0.3 - 1.2 mg/dL   GFR calc non Af Amer 39 (L) >60 mL/min   GFR calc Af Amer 45 (L) >60 mL/min   Anion gap 8 5 - 15    Comment: Performed at [facility], [place] [address] [place], [place] [address]  Brain natriuretic peptide     Status: None   Collection Time: 08/30/18  2:24 AM  Result Value Ref Range   B Natriuretic Peptide 20.5 0.0 - 100.0 pg/mL    Comment: Performed at [facility], [place] [address] [place], [place] [address]  Troponin I - ONCE - STAT     Status: Abnormal   Collection Time: 08/30/18  2:24 AM  Result Value Ref Range   Troponin I 0.43 (HH) <0.03 ng/mL    Comment: CRITICAL RESULT CALLED TO, READ BACK BY AND VERIFIED WITHHenrene Dodge RN [id number] 08/30/18 A NAVARRO Performed at [facility], [place] [address] [place], [place] [address]   Troponin I - Now Then Q6H     Status: Abnormal   Collection Time: 08/30/18  9:36 AM  Result Value Ref Range   Troponin I 0.78 (HH) <0.03 ng/mL    Comment: CRITICAL VALUE NOTED.  VALUE IS CONSISTENT WITH PREVIOUSLY REPORTED AND CALLED VALUE. Performed at [facility], [place] [address], [place], [place] [address]   POC CBG, ED     Status: Abnormal   Collection Time: 08/30/18  1:22 PM  Result Value Ref Range   Glucose-Capillary 240 (H) 70 - 99 mg/dL  Troponin I - Now Then Q6H     Status: Abnormal   Collection Time: 08/30/18  2:36 PM  Result Value Ref Range   Troponin I 0.80 (HH) <0.03 ng/mL    Comment: CRITICAL VALUE  NOTED.  VALUE IS CONSISTENT WITH PREVIOUSLY REPORTED AND CALLED VALUE. Performed at [facility], [place] [address]  [place], [place] [address]    Dg Chest 2 View  Result Date: 08/30/2018 CLINICAL DATA:  Cough, chest congestion EXAM: CHEST - 2 VIEW COMPARISON:  01/07/2015 FINDINGS: Lungs are clear.  No pleural effusion or pneumothorax. The heart is normal in size. Degenerative changes of the visualized thoracolumbar spine. IMPRESSION: Normal chest radiographs. Electronically Signed   By: Julian Hy M.D.   On: 08/30/2018 02:33   Ct Angio Chest Pe W And/or Wo Contrast  Result Date: 08/30/2018 CLINICAL DATA:  Shortness of breath. EXAM: CT ANGIOGRAPHY CHEST WITH CONTRAST TECHNIQUE: Multidetector CT imaging of the chest was performed using the standard protocol during bolus administration of intravenous contrast. Multiplanar CT image reconstructions and MIPs were obtained to evaluate the vascular anatomy. CONTRAST:  44mL ISOVUE-370 IOPAMIDOL (ISOVUE-370) INJECTION 76% COMPARISON:  Radiography same day. CT 09/11/2014. FINDINGS: Cardiovascular: Pulmonary arterial opacification is good. There are no pulmonary emboli. The heart is at the upper limits of normal in size. There is some coronary artery calcification. There is aortic atherosclerosis without aneurysm or dissection Mediastinum/Nodes: No mediastinal mass or lymphadenopathy. Lungs/Pleura: No pleural effusion. There is pronounced tracheal narrowing which could be due to tracheomalacia and or tracheobronchitis. Major bronchi are also narrowed with apparent wall thickening. Particular narrowing of the lower lobe bronchi right more than left. Atelectasis at the posterior inferior right lower lobe. Chronic bronchiectasis and or bronchial atresia in the right middle lobe as seen previously. Chronic small benign nodules in the lingula as seen previously. No upper lobe parenchymal finding other than a 3 mm nodule in the right upper lobe image 23 measuring 2.5 mm, likely to be benign. No underlying emphysema. Upper Abdomen: Chronic cyst in the left lobe of the liver. No  significant upper abdominal finding. Patient does have a small hiatal hernia. Musculoskeletal: Ordinary spondylosis. Review of the MIP images confirms the above findings. IMPRESSION: 1. No pulmonary emboli. 2. Pronounced tracheal narrowing which could be due to tracheomalacia and/or tracheobronchitis. Narrowing and wall thickening also of the main bronchial branches, particularly in the right lower lobe. Partial atelectasis of the posterior inferior right lower lobe. 3. Chronic bronchiectasis and or bronchial atresia in the right middle lobe. 4. Coronary artery calcification. 5. Small hiatal hernia. Aortic Atherosclerosis (ICD10-I70.0). Electronically Signed   By: [place] Chimes M.D.   On: 08/30/2018 06:35   EKG Interpretation  Date/Time:  Thursday August 30 2018 02:29:40 EST Ventricular Rate:  84 PR Interval:    QRS Duration: 82 QT Interval:  338 QTC Calculation: 400 R Axis:   16 Text Interpretation:  Normal sinus rhythm Confirmed by Merrily Pew [phone]) on 08/30/2018 3:51:13 AM Also confirmed by Merrily Pew [phone]), editor Philomena Doheny [phone])  on 08/30/2018 8:02:05 AM   Pending Labs Unresulted Labs (From admission, onward)    Start     Ordered   08/31/18 7628  Basic metabolic panel  Tomorrow morning,   R     08/30/18 0931   08/31/18 0500  CBC  Tomorrow morning,   R     08/30/18 0931          Vitals/Pain Today's Vitals   08/30/18 1318 08/30/18 1400 08/30/18 1500 08/30/18 1700  BP:  127/60 126/65 (!) 142/68  Pulse:  70 70 81  Resp:  (!) 23 (!) 23 (!) 22  Temp: 98.6 F (37 C)  98.2 F (36.8 C)  TempSrc: Oral  Oral   SpO2:  94% 94% 97%  Weight:      Height:      PainSc:   0-No pain     Isolation Precautions No active isolations  Medications Medications  ipratropium-albuterol (DUONEB) 0.5-2.5 (3) MG/3ML nebulizer solution 3 mL (3 mLs Nebulization Given 08/30/18 1550)  sodium chloride (PF) 0.9 % injection (has no administration in time range)  iopamidol (ISOVUE-370) 76  % injection (has no administration in time range)  cholecalciferol (VITAMIN D3) tablet 5,000 Units (5,000 Units Oral Given 08/30/18 1306)  multivitamin with minerals tablet 1 tablet (1 tablet Oral Given 08/30/18 1208)  calcium-vitamin D (OSCAL WITH D) 500-200 MG-UNIT per tablet 1 tablet (1 tablet Oral Given 08/30/18 1306)  apixaban (ELIQUIS) tablet 2.5 mg (2.5 mg Oral Given 08/30/18 1305)  donepezil (ARICEPT) tablet 10 mg (has no administration in time range)  rosuvastatin (CRESTOR) tablet 20 mg (20 mg Oral Given 08/30/18 1305)  levothyroxine (SYNTHROID, LEVOTHROID) tablet 100 mcg (100 mcg Oral Given 08/30/18 1305)  insulin detemir (LEVEMIR) injection 24 Units (24 Units Subcutaneous Given 08/30/18 1323)  polyvinyl alcohol (LIQUIFILM TEARS) 1.4 % ophthalmic solution 1 drop (has no administration in time range)  lisinopril (PRINIVIL,ZESTRIL) tablet 10 mg (10 mg Oral Given 08/30/18 1208)  acetaminophen (TYLENOL) tablet 650 mg (has no administration in time range)    Or  acetaminophen (TYLENOL) suppository 650 mg (has no administration in time range)  traMADol (ULTRAM) tablet 50 mg (has no administration in time range)  polyethylene glycol (MIRALAX / GLYCOLAX) packet 17 g (has no administration in time range)  ondansetron (ZOFRAN) tablet 4 mg (has no administration in time range)    Or  ondansetron (ZOFRAN) injection 4 mg (has no administration in time range)  albuterol (PROVENTIL) (2.5 MG/3ML) 0.083% nebulizer solution 2.5 mg (has no administration in time range)  ipratropium (ATROVENT) nebulizer solution 0.5 mg (has no administration in time range)  Levofloxacin (LEVAQUIN) IVPB 250 mg (has no administration in time range)  menthol-cetylpyridinium (CEPACOL) lozenge 3 mg (has no administration in time range)  potassium chloride SA (K-DUR,KLOR-CON) CR tablet 20 mEq (20 mEq Oral Given 08/30/18 1308)  furosemide (LASIX) tablet 20 mg (20 mg Oral Given 08/30/18 1308)  aspirin EC tablet 81 mg (has no  administration in time range)  pantoprazole (PROTONIX) EC tablet 40 mg (has no administration in time range)  methylPREDNISolone sodium succinate (SOLU-MEDROL) 125 mg/2 mL injection 80 mg (80 mg Intravenous Given 08/30/18 1330)  dextromethorphan-guaiFENesin (MUCINEX DM) 30-600 MG per 12 hr tablet 2 tablet (2 tablets Oral Given 08/30/18 1307)  iopamidol (ISOVUE-370) 76 % injection 100 mL (80 mLs Intravenous Contrast Given 08/30/18 0605)  predniSONE (DELTASONE) tablet 60 mg (60 mg Oral Given 08/30/18 0705)  levofloxacin (LEVAQUIN) IVPB 500 mg (0 mg Intravenous Stopped 08/30/18 1039)  aspirin chewable tablet 324 mg (324 mg Oral Given 08/30/18 1208)    Mobility walks with device

## 2018-08-30 NOTE — ED Notes (Signed)
ECHOCARDIOGRAM AT BEDSIDE

## 2018-08-30 NOTE — ED Triage Notes (Signed)
Pt from home with c/o shortness of breath.  Was seen here emergently last Monday for sore throat and subsequently discharged home with viral laryngitis.  Pt's O2 sat 96% on 2L of O2 by EMS (was in the 90's on room air, per EMS).  Pt denies fever or chills.

## 2018-08-30 NOTE — ED Notes (Signed)
CBG 240 

## 2018-08-30 NOTE — Progress Notes (Signed)
Pharmacy Antibiotic Note  Melissa Shannon is a 85 y.o. female admitted on 08/30/2018 with bronchitis/bronchiectasis. PCN allergy with throat tightness noted in 2015; one-time dose of Rocephin given prior to this, but no other beta-lactams afterward. Pharmacy has been consulted for Levaquin dosing.  Plan:  Levaquin 500 mg IV x 1, then 250 mg IV q24 hr with CrCl 20-50 ml/min  SCr appears to be at baseline and no Cx obtained; Pharmacy will sign off, following peripherally for changes in renal function  Height: 5\' 8"  (172.7 cm) Weight: 240 lb (108.9 kg) IBW/kg (Calculated) : 63.9  Temp (24hrs), Avg:98.7 F (37.1 C), Min:98 F (36.7 C), Max:99.2 F (37.3 C)  Recent Labs  Lab 08/30/18 0224  WBC 8.1  CREATININE 1.23*    Estimated Creatinine Clearance: 40.9 mL/min (A) (by C-G formula based on SCr of 1.23 mg/dL (H)).    Allergies  Allergen Reactions  . Penicillins Other (See Comments)    DID THE REACTION INVOLVE: Swelling of the face/tongue/throat, SOB, or low BP? Yes Sudden or severe rash/hives, skin peeling, or the inside of the mouth or nose? No Did it require medical treatment? No When did it last happen? If all above answers are "NO", may proceed with cephalosporin use.   Throat felt tight     Thank you for allowing pharmacy to be a part of this patient's care.  Reuel Boom, PharmD, BCPS [phone] 08/30/2018, 8:05 AM

## 2018-08-30 NOTE — ED Notes (Signed)
ED Provider at bedside. MESNER 

## 2018-08-30 NOTE — Consult Note (Signed)
PULMONARY / CRITICAL CARE MEDICINE   NAME:  Melissa Shannon, MRN:  [id number], DOB:  03-Nov-1927, LOS: 0 ADMISSION DATE:  08/30/2018, CONSULTATION DATE:  08/30/18 REFERRING MD:  ed, CHIEF COMPLAINT:  Sob/hoarseness  BRIEF HISTORY:     89 yowf never smoker with h/ bronchiectsis/ suspected PE with transient PH resolved on anticoagulation on chronic coumadin and ACEi seen if office 02/27/18 with no cough and chronic doe attributed to obesity/ deconditioning and able to walk very slowly flat surface with rolling walker then acutely ill 08/27/17 with ST/ hoarseness, dry cough > er eval 1/15/ c/w viral uri and came back am 1/16 with sb at rest with upper and lower airway wheezing with CTa abn c/w ? Tracheitis/ tracheomalacia so PCCM service consulted.   HISTORY OF PRESENT ILLNESS   As above/ onset of present symptoms abrupt x 3 d prior to admit .  Prior w/u - See V/Q 06/13/14 intermediate  - echo 06/13/14 c/w RV strain, ? Acute on chronic  - Venous dopplers 06/14/14 neg both lower ext but tds  - echo 08/25/14 repeat Echo >  PAS down to 32 - Echo 10/16/14 repeat Echo > wnl  - baseline V/Q repeat requested 01/07/2015 > negative   No obvious day to day or daytime variability or assoc excess/ purulent sputum or mucus plugs or hemoptysis or cp or chest tightness,  overt sinus or hb symptoms.     Also denies any obvious fluctuation of symptoms with weather or environmental changes or other aggravating or alleviating factors except as outlined above   No unusual exposure hx or h/o childhood pna/ asthma or knowledge of premature birth.  Current Allergies, Complete Past Medical History, Past Surgical History, Family History, and Social History were reviewed in [employer] record.  ROS  The following are not active complaints unless bolded Hoarseness, sore throat, dysphagia, dental problems, itching, sneezing,  nasal congestion or discharge of excess mucus or purulent secretions, ear ache,    fever, chills, sweats, unintended wt loss or wt gain, classically pleuritic or exertional cp,  orthopnea pnd or arm/hand swelling  or leg swelling, presyncope, palpitations, abdominal pain, anorexia, nausea, vomiting, diarrhea  or change in bowel habits or change in bladder habits, change in stools or change in urine, dysuria, hematuria,  rash, arthralgias, visual complaints, headache, numbness, weakness or ataxia or problems with walking or coordination,  change in mood or  memory.           SIGNIFICANT EVENTS:    STUDIES:   CTa 08/30/17 :  1. No pulmonary emboli. 2. Pronounced tracheal narrowing which could be due to tracheomalacia and/or tracheobronchitis. Narrowing and wall thickening also of the main bronchial branches, particularly in the right lower lobe. Partial atelectasis of the posterior inferior right lower lobe. 3. Chronic bronchiectasis and or bronchial atresia in the right middle lobe. 4. Coronary artery calcification. 5. Small hiatal hernia.  CULTURES:    ANTIBIOTICS:  Levaquin 1/16>>>  LINES/TUBES:   none CONSULTANTS:  Cardiology for pos troponin 1/16  SUBJECTIVE:  Comfortable at 30 degrees hob on Nasal 02 2lpm but quite hoarse  CONSTITUTIONAL: BP (!) 155/60   Pulse 77   Temp 98 F (36.7 C) (Oral)   Resp (!) 23   Ht '5\' 8"'$  (1.727 m)   Wt 108.9 kg   SpO2 93%   BMI 36.49 kg/m   No intake/output data recorded.        PHYSICAL EXAM: General:  Elderly very hoarse alert wf  nad Neuro:  Intact sensorium, no deficits apparent HEENT:  Dentures/ orophx clear, no nodes or TM/ trachea midline with prominent "wheeze" Cardiovascular:  RRR no s3 Lungs:  Lung with inps and exp rhonchi much of which are transmitted by upper airway Abdomen:  Soft/ no hsm Musculoskeletal:  No deficits/ massive LE c/w lymphedema Skin:  No lesions  RESOLVED PROBLEM LIST   ASSESSMENT AND PLAN     1) acute hypoxemic RF in setting of uri with lots of upper airway wheezing in pt  on ACEi  - D/C acei / rx 02 to maintain sats > 90%  2) Bronchiectasis flare related to URI - rx duoneb/ steroids/ flutter valve/ mucinex and levaquin empirically  - will need f/u pfts in office   3) Likely ACEi effects superimposed on viral uri/ ? Reflux > very severe upper airway cough/instability - ACE inhibitors are problematic in  pts with airway complaints because  even experienced pulmonologists can't always distinguish ace effects from copd/asthma.  By themselves they don't actually cause a problem, much like oxygen can't by itself start a fire, but they certainly serve as a powerful catalyst or enhancer for any "fire"  or inflammatory process in the upper airway, be it caused by an ET  tube or more commonly reflux (especially in the obese or pts with known GERD or who are on biphoshonates).    In the era of ARB near equivalency until we have a better handle on the reversibility of the airway problem, it just makes sense to avoid ACEI  entirely in the short run and then decide later, having established a level of airway control using a reasonable limited regimen, whether to add back ace but even then being very careful to observe the pt for worsening airway control and number of meds used/ needed to control symptoms.     4) UACS Upper airway cough syndrome (previously labeled PNDS),  is so named because it's frequently impossible to sort out how much is  CR/sinusitis with freq throat clearing (which can be related to primary GERD)   vs  causing  secondary (" extra esophageal")  GERD from wide swings in gastric pressure that occur with throat clearing, often  promoting self use of mint and menthol lozenges that reduce the lower esophageal sphincter tone and exacerbate the problem further in a cyclical fashion.   These are the same pts (now being labeled as having "irritable larynx syndrome" by some cough centers) who not infrequently have a history of having failed to tolerate ace inhibitors,   dry powder inhalers or biphosphonates or report having atypical/extraesophageal reflux symptoms that don't respond to standard doses of PPI  and are easily confused as having aecopd or asthma flares by even experienced allergists/ pulmonologists (myself included).   >>> leave off acei indefinitely / max rx for gerd/ cough suppression with mucinex dm/ flutter and leave off fosfmax as well     I will f/u with her in am to see if improving/ no role for fob or further w/u at this point Discussed in detail with daughter at bedside         LABS  Glucose No results for input(s): GLUCAP in the last 168 hours.  BMET Recent Labs  Lab 08/30/18 0224  NA 139  K 3.9  CL 103  CO2 28  BUN 26*  CREATININE 1.23*  GLUCOSE 122*    Liver Enzymes Recent Labs  Lab 08/30/18 0224  AST 28  ALT 20  ALKPHOS  40  BILITOT 0.7  ALBUMIN 3.5    Electrolytes Recent Labs  Lab 08/30/18 0224  CALCIUM 9.2    CBC Recent Labs  Lab 08/30/18 0224  WBC 8.1  HGB 13.5  HCT 43.5  PLT 131*    ABG No results for input(s): PHART, PCO2ART, PO2ART in the last 168 hours.  Coag's No results for input(s): APTT, INR in the last 168 hours.  Sepsis Markers No results for input(s): LATICACIDVEN, PROCALCITON, O2SATVEN in the last 168 hours.  Cardiac Enzymes Recent Labs  Lab 08/30/18 0224  TROPONINI 0.43*    PAST MEDICAL HISTORY :   She  has a past medical history of Arthritis, CHF (congestive heart failure) ([place]), Diabetes mellitus without complication ([place]), Edema, Hyperlipidemia, Hypertension, Hypothyroidism, Pulmonary embolism ([place]), and Urinary incontinence.  PAST SURGICAL HISTORY:  She  has a past surgical history that includes Cholecystectomy; Tonsillectomy and adenoidectomy (1937); Pilonidal cyst excision (1952); Eye surgery; right elbow (1999); Bladder surgery (2000); and Cataract extraction (03/2013).  Allergies  Allergen Reactions  . Penicillins Other (See Comments)    DID THE  REACTION INVOLVE: Swelling of the face/tongue/throat, SOB, or low BP? Yes Sudden or severe rash/hives, skin peeling, or the inside of the mouth or nose? No Did it require medical treatment? No When did it last happen? If all above answers are "NO", may proceed with cephalosporin use.   Throat felt tight    No current facility-administered medications on file prior to encounter.    Current Outpatient Medications on File Prior to Encounter  Medication Sig  . alendronate (FOSAMAX) 70 MG tablet TAKE 1 TABLET BY MOUTH ONCE WEEKLY TAKE WITH FULL GLASS OF WATER ON AN EMPTY STOMACH (Patient taking differently: Take 70 mg by mouth every Wednesday. )  . Calcium Carbonate-Vitamin D (CALTRATE 600+D) 600-400 MG-UNIT tablet Take 1 tablet by mouth 2 (two) times daily.  . Cholecalciferol (VITAMIN D3) 5000 UNITS CHEW Chew 5,000 Units by mouth daily.   Marland Kitchen donepezil (ARICEPT) 10 MG tablet TAKE ONE TABLET BY MOUTH ONCE DAILY AT BEDTIME TO PRESERVE MEMORY (Patient taking differently: Take 10 mg by mouth at bedtime. Take one tablet by mouth once daily at bedtime to preserve memory)  . ELIQUIS 5 MG TABS tablet TAKE 1 TABLET BY MOUTH TWICE A DAY (Patient taking differently: Take 2.5 mg by mouth 2 (two) times daily. )  . feeding supplement, ENSURE COMPLETE, (ENSURE COMPLETE) LIQD Take 237 mLs by mouth 2 (two) times daily between meals as needed (As requested by patient). (Patient taking differently: Take 237 mLs by mouth daily. )  . furosemide (LASIX) 20 MG tablet Take 0.5 tablets (10 mg total) by mouth daily. (Patient taking differently: Take 10 mg by mouth 2 (two) times daily. )  . hydroxypropyl methylcellulose / hypromellose (ISOPTO TEARS / GONIOVISC) 2.5 % ophthalmic solution Place 1 drop into both eyes 3 (three) times daily as needed for dry eyes.  Marland Kitchen LEVEMIR FLEXTOUCH 100 UNIT/ML Pen INJECT 24 UNITS INTO THE SKIN DAILY. (Patient taking differently: Inject 24 Units into the skin daily. )  . levothyroxine  (SYNTHROID, LEVOTHROID) 100 MCG tablet TAKE 1 TABLET BY MOUTH EVERY DAY BEFORE BREAKFAST (Patient taking differently: Take 100 mcg by mouth daily before breakfast. )  . lisinopril (PRINIVIL,ZESTRIL) 10 MG tablet TAKE 1 TABLET BY MOUTH TWICE A DAY (Patient taking differently: Take 10 mg by mouth 2 (two) times daily. )  . Multiple Vitamin (MULTIVITAMIN WITH MINERALS) TABS tablet Take 1 tablet by mouth daily.  . rosuvastatin (  CRESTOR) 20 MG tablet TAKE 1 TABLET EVERY DAY FOR CHOLESTEROL (Patient taking differently: Take 20 mg by mouth daily. )  . TRADJENTA 5 MG TABS tablet TAKE 1 TABLET BY MOUTH EVERY DAY (Patient taking differently: Take 5 mg by mouth daily. )  . BD PEN NEEDLE NANO U/F 32G X 4 MM MISC USE AS DIRECTED ONCE DAILY  . Blood Glucose Monitoring Suppl (ONE TOUCH ULTRA 2) w/Device KIT 1 Device by Does not apply route daily. Dx: E11.22  . ONE TOUCH ULTRA TEST test strip Use every other day to test blood sugar  . ONETOUCH DELICA LANCETS 95V MISC Use every other day to test blood sugar. Dx: E11.22    FAMILY HISTORY:   Her family history includes Cancer in her brother and maternal grandfather; Diabetes in her maternal grandmother; Melanoma in her brother; Suicidality in her father.  SOCIAL HISTORY:  She  reports that she has never smoked. She has never used smokeless tobacco. She reports that she does not drink alcohol or use drugs.      The patient is critically ill with multiple potential sources for acute resp failure  and requires high complexity decision making for assessment and support, frequent evaluation and titration of therapies, application of advanced monitoring technologies and extensive interpretation of multiple databases. Critical Care Time devoted to patient care services described in this note is 45 minutes.

## 2018-08-30 NOTE — ED Notes (Signed)
RRT AT BEDSIDE 

## 2018-08-30 NOTE — Progress Notes (Signed)
  Echocardiogram 2D Echocardiogram has been performed.  Melissa Charleston 08/30/2018, 4:43 PM

## 2018-08-30 NOTE — ED Notes (Signed)
CARDIOLOGIST PA-C

## 2018-08-30 NOTE — ED Provider Notes (Signed)
Emergency Department Provider Note   I have reviewed the triage vital signs and the nursing notes.   HISTORY  Chief Complaint Shortness of Breath   HPI Melissa Shannon is a 85 y.o. female with multiple medical problems as documented below the presents the emergency department today with shortness of breath.  She is with her daughter who helps supplement the history.  Is found that the patient had laryngitis/pharyngitis for the last couple days was actually seen here 24 hours ago for those.  She was not having shortness of breath or cough during those times however today she called her daughter and stated she was having worsening shortness of breath the daughter heard her sounded raspy and appeared short of breath when she went to her house.  EMS was called the patient had low oxygen and was started on oxygen and brought here without any further interventions.  Patient denies any fever but does have a nonproductive cough.  Voice is getting better.  Patient does not get vaccines. No other associated or modifying symptoms.    Past Medical History:  Diagnosis Date  . Arthritis    bilateral knees  . CHF (congestive heart failure) ([place])   . Diabetes mellitus without complication ([place])   . Edema   . Hyperlipidemia   . Hypertension   . Hypothyroidism   . Pulmonary embolism ([place])   . Urinary incontinence     Patient Active Problem List   Diagnosis Date Noted  . Dyspnea 08/30/2018  . Tachycardia 02/27/2018  . DOE (dyspnea on exertion) 02/27/2018  . Chronic diastolic CHF (congestive heart failure) ([place]) 03/05/2016  . CKD (chronic kidney disease) stage 4, GFR 15-29 ml/min ([facility]) 03/19/2015  . General weakness 11/29/2014  . UTI (urinary tract infection) 11/29/2014  . Diabetes mellitus with renal manifestations, uncontrolled ([place]) 11/29/2014  . Aphasia 11/28/2014  . Pulmonary embolus ([place]) 06/23/2014  . Multiple pulmonary nodules 06/14/2014  . Acute respiratory failure with hypoxia  ([place]) 06/14/2014  . Malnutrition of moderate degree ([place]) 06/12/2014  . Hypertension 06/11/2014  . Hyperlipemia 06/11/2014  . CHF (congestive heart failure) ([place]) 06/11/2014    Past Surgical History:  Procedure Laterality Date  . BLADDER SURGERY  2000   Dr.Tannerbaum (Bladder Tact)  . CATARACT EXTRACTION  03/2013   Dr.Shapiro  . CHOLECYSTECTOMY    . EYE SURGERY     bilateral cataracts  . [facility]   . right elbow  1999   x 2, still has one piece of metal in it  . TONSILLECTOMY AND ADENOIDECTOMY  1937   Dr.Brewer    Current Outpatient Rx  . Order #: [id number] Class: Normal  . Order #: [id number] Class: Historical Med  . Order #: [id number] Class: Historical Med  . Order #: [id number] Class: Normal  . Order #: [id number] Class: Normal  . Order #: [id number] Class: Print  . Order #: [id number] Class: No Print  . Order #: [id number] Class: Historical Med  . Order #: [id number] Class: Normal  . Order #: [id number] Class: Normal  . Order #: [id number] Class: Normal  . Order #: [id number] Class: Print  . Order #: [id number] Class: Normal  . Order #: [id number] Class: Normal  . Order #: [id number] Class: Normal  . Order #: [id number] Class: Normal  . Order #: [id number] Class: Normal  . Order #: [id number] Class: Normal    Allergies Penicillins  Family History  Problem Relation Age of Onset  . Suicidality Father   . Cancer Brother   . Cancer Maternal Grandfather   . Diabetes  Maternal Grandmother   . Melanoma Brother     Social History Social History   Tobacco Use  . Smoking status: Never Smoker  . Smokeless tobacco: Never Used  Substance Use Topics  . Alcohol use: No  . Drug use: No    Review of Systems  All other systems negative except as documented in the HPI. All pertinent positives and negatives as reviewed in the HPI. ____________________________________________   PHYSICAL EXAM:  VITAL SIGNS: Vitals:   08/30/18 0352 08/30/18 0400 08/30/18  0601 08/30/18 0700  BP: (!) 150/66 (!) 157/74 137/65 (!) 155/60  Pulse: 92 94 84 77  Resp: 20 (!) 21  (!) 23  Temp:   98 F (36.7 C)   TempSrc:   Oral   SpO2: 96% 93% 97% 98%  Weight:      Height:         Constitutional: Alert and oriented. Well appearing and in no acute distress. Eyes: Conjunctivae are normal. PERRL. EOMI. Head: Atraumatic. Nose: No congestion/rhinnorhea. Mouth/Throat: Mucous membranes are moist.  Oropharynx non-erythematous. Neck: No stridor.  No meningeal signs.   Cardiovascular: Normal rate, regular rhythm. Good peripheral circulation. Grossly normal heart sounds.   Respiratory: tachypneic respiratory effort.  No retractions. Lungs diminished with wheezing. Hypoxic on room air to 89% Gastrointestinal: Soft and nontender. No distention.  Musculoskeletal: No lower extremity tenderness but significant edema/lymphedema. No gross deformities of extremities. Neurologic:  Normal speech and language. No gross focal neurologic deficits are appreciated.  Skin:  Skin is warm, dry and intact. No rash noted.   ____________________________________________   LABS (all labs ordered are listed, but only abnormal results are displayed)  Labs Reviewed  CBC WITH DIFFERENTIAL/PLATELET - Abnormal; Notable for the following components:      Result Value   Platelets 131 (*)    Lymphs Abs 0.6 (*)    All other components within normal limits  COMPREHENSIVE METABOLIC PANEL - Abnormal; Notable for the following components:   Glucose, Bld 122 (*)    BUN 26 (*)    Creatinine, Ser 1.23 (*)    Total Protein 6.3 (*)    GFR calc non Af Amer 39 (*)    GFR calc Af Amer 45 (*)    All other components within normal limits  TROPONIN I - Abnormal; Notable for the following components:   Troponin I 0.43 (*)    All other components within normal limits  BRAIN NATRIURETIC PEPTIDE   ____________________________________________  EKG   EKG Interpretation  Date/Time:  Thursday August 30 2018 02:29:40 EST Ventricular Rate:  84 PR Interval:    QRS Duration: 82 QT Interval:  338 QTC Calculation: 400 R Axis:   16 Text Interpretation:  Normal sinus rhythm Confirmed by Merrily Pew [phone]) on 08/30/2018 3:51:13 AM       ____________________________________________  RADIOLOGY  Dg Chest 2 View  Result Date: 08/30/2018 CLINICAL DATA:  Cough, chest congestion EXAM: CHEST - 2 VIEW COMPARISON:  01/07/2015 FINDINGS: Lungs are clear.  No pleural effusion or pneumothorax. The heart is normal in size. Degenerative changes of the visualized thoracolumbar spine. IMPRESSION: Normal chest radiographs. Electronically Signed   By: Julian Hy M.D.   On: 08/30/2018 02:33   Ct Angio Chest Pe W And/or Wo Contrast  Result Date: 08/30/2018 CLINICAL DATA:  Shortness of breath. EXAM: CT ANGIOGRAPHY CHEST WITH CONTRAST TECHNIQUE: Multidetector CT imaging of the chest was performed using the standard protocol during bolus administration of intravenous contrast. Multiplanar CT image reconstructions  and MIPs were obtained to evaluate the vascular anatomy. CONTRAST:  42mL ISOVUE-370 IOPAMIDOL (ISOVUE-370) INJECTION 76% COMPARISON:  Radiography same day. CT 09/11/2014. FINDINGS: Cardiovascular: Pulmonary arterial opacification is good. There are no pulmonary emboli. The heart is at the upper limits of normal in size. There is some coronary artery calcification. There is aortic atherosclerosis without aneurysm or dissection Mediastinum/Nodes: No mediastinal mass or lymphadenopathy. Lungs/Pleura: No pleural effusion. There is pronounced tracheal narrowing which could be due to tracheomalacia and or tracheobronchitis. Major bronchi are also narrowed with apparent wall thickening. Particular narrowing of the lower lobe bronchi right more than left. Atelectasis at the posterior inferior right lower lobe. Chronic bronchiectasis and or bronchial atresia in the right middle lobe as seen previously. Chronic  small benign nodules in the lingula as seen previously. No upper lobe parenchymal finding other than a 3 mm nodule in the right upper lobe image 23 measuring 2.5 mm, likely to be benign. No underlying emphysema. Upper Abdomen: Chronic cyst in the left lobe of the liver. No significant upper abdominal finding. Patient does have a small hiatal hernia. Musculoskeletal: Ordinary spondylosis. Review of the MIP images confirms the above findings. IMPRESSION: 1. No pulmonary emboli. 2. Pronounced tracheal narrowing which could be due to tracheomalacia and/or tracheobronchitis. Narrowing and wall thickening also of the main bronchial branches, particularly in the right lower lobe. Partial atelectasis of the posterior inferior right lower lobe. 3. Chronic bronchiectasis and or bronchial atresia in the right middle lobe. 4. Coronary artery calcification. 5. Small hiatal hernia. Aortic Atherosclerosis (ICD10-I70.0). Electronically Signed   By: Nelson Chimes M.D.   On: 08/30/2018 06:35    ____________________________________________   PROCEDURES  Procedure(s) performed:   Procedures  CRITICAL CARE Performed by: Merrily Pew Total critical care time: 35 minutes Critical care time was exclusive of separately billable procedures and treating other patients. Critical care was necessary to treat or prevent imminent or life-threatening deterioration. Critical care was time spent personally by me on the following activities: development of treatment plan with patient and/or surrogate as well as nursing, discussions with consultants, evaluation of patient's response to treatment, examination of patient, obtaining history from patient or surrogate, ordering and performing treatments and interventions, ordering and review of laboratory studies, ordering and review of radiographic studies, pulse oximetry and re-evaluation of patient's condition.  ____________________________________________   INITIAL IMPRESSION /  ASSESSMENT AND PLAN / ED COURSE  Suspect likely bronchitis however she does have a history of CHF so we will check those labs as well.  X-ray.  No indication for antibiotics at the present will give breathing treatment pending x-ray.  He with evidence of tracheobronchitis.  Steroids started.  Antibiotics started.  Patient will be admitted to the hospital secondary to this and the hypoxia and positive troponin.  Will discuss with pulmonology and cardiology regarding level of care and place of admission.  Discussed ct findings with Dr. Melvyn Novas. [place] for stepdown with [facility]. They will consult.   Discussed positiive troponin and ecg with Dr. Sallyanne Kuster, they will see but ok with trending troponins and admit to [facility] at this time.   Discussed with hospitalist, Dr. Louanne Belton, will admit.      Pertinent labs & imaging results that were available during my care of the patient were reviewed by me and considered in my medical decision making (see chart for details).  ____________________________________________  FINAL CLINICAL IMPRESSION(S) / ED DIAGNOSES  Final diagnoses:  None     MEDICATIONS GIVEN DURING THIS VISIT:  Medications  ipratropium-albuterol (DUONEB) 0.5-2.5 (3) MG/3ML nebulizer solution 3 mL (3 mLs Nebulization Given 08/30/18 0317)  sodium chloride (PF) 0.9 % injection (has no administration in time range)  iopamidol (ISOVUE-370) 76 % injection (has no administration in time range)  levofloxacin (LEVAQUIN) IVPB 500 mg (has no administration in time range)  iopamidol (ISOVUE-370) 76 % injection 100 mL (80 mLs Intravenous Contrast Given 08/30/18 0605)  predniSONE (DELTASONE) tablet 60 mg (60 mg Oral Given 08/30/18 0705)     NEW OUTPATIENT MEDICATIONS STARTED DURING THIS VISIT:  New Prescriptions   No medications on file    Note:  This note was prepared with assistance of Dragon voice recognition software. Occasional wrong-word or sound-a-like substitutions may have occurred due to the  inherent limitations of voice recognition software.   Bitha Fauteux, Corene Cornea, MD 08/30/18 [phone]

## 2018-08-31 DIAGNOSIS — Z9049 Acquired absence of other specified parts of digestive tract: Secondary | ICD-10-CM | POA: Diagnosis not present

## 2018-08-31 DIAGNOSIS — Z9842 Cataract extraction status, left eye: Secondary | ICD-10-CM | POA: Diagnosis not present

## 2018-08-31 DIAGNOSIS — J471 Bronchiectasis with (acute) exacerbation: Secondary | ICD-10-CM | POA: Diagnosis not present

## 2018-08-31 DIAGNOSIS — J040 Acute laryngitis: Secondary | ICD-10-CM | POA: Diagnosis present

## 2018-08-31 DIAGNOSIS — I130 Hypertensive heart and chronic kidney disease with heart failure and stage 1 through stage 4 chronic kidney disease, or unspecified chronic kidney disease: Secondary | ICD-10-CM | POA: Diagnosis present

## 2018-08-31 DIAGNOSIS — R32 Unspecified urinary incontinence: Secondary | ICD-10-CM | POA: Diagnosis present

## 2018-08-31 DIAGNOSIS — I5082 Biventricular heart failure: Secondary | ICD-10-CM | POA: Diagnosis present

## 2018-08-31 DIAGNOSIS — R1310 Dysphagia, unspecified: Secondary | ICD-10-CM | POA: Diagnosis present

## 2018-08-31 DIAGNOSIS — J449 Chronic obstructive pulmonary disease, unspecified: Secondary | ICD-10-CM | POA: Diagnosis present

## 2018-08-31 DIAGNOSIS — Z6836 Body mass index (BMI) 36.0-36.9, adult: Secondary | ICD-10-CM | POA: Diagnosis not present

## 2018-08-31 DIAGNOSIS — T464X5A Adverse effect of angiotensin-converting-enzyme inhibitors, initial encounter: Secondary | ICD-10-CM | POA: Diagnosis present

## 2018-08-31 DIAGNOSIS — I5032 Chronic diastolic (congestive) heart failure: Secondary | ICD-10-CM | POA: Diagnosis present

## 2018-08-31 DIAGNOSIS — Z86711 Personal history of pulmonary embolism: Secondary | ICD-10-CM | POA: Diagnosis not present

## 2018-08-31 DIAGNOSIS — J029 Acute pharyngitis, unspecified: Secondary | ICD-10-CM | POA: Diagnosis present

## 2018-08-31 DIAGNOSIS — J9621 Acute and chronic respiratory failure with hypoxia: Secondary | ICD-10-CM | POA: Diagnosis present

## 2018-08-31 DIAGNOSIS — E669 Obesity, unspecified: Secondary | ICD-10-CM | POA: Diagnosis present

## 2018-08-31 DIAGNOSIS — N184 Chronic kidney disease, stage 4 (severe): Secondary | ICD-10-CM | POA: Diagnosis present

## 2018-08-31 DIAGNOSIS — F0390 Unspecified dementia without behavioral disturbance: Secondary | ICD-10-CM | POA: Diagnosis present

## 2018-08-31 DIAGNOSIS — Z9089 Acquired absence of other organs: Secondary | ICD-10-CM | POA: Diagnosis not present

## 2018-08-31 DIAGNOSIS — R05 Cough: Secondary | ICD-10-CM | POA: Diagnosis not present

## 2018-08-31 DIAGNOSIS — E785 Hyperlipidemia, unspecified: Secondary | ICD-10-CM | POA: Diagnosis present

## 2018-08-31 DIAGNOSIS — Z9841 Cataract extraction status, right eye: Secondary | ICD-10-CM | POA: Diagnosis not present

## 2018-08-31 DIAGNOSIS — E1122 Type 2 diabetes mellitus with diabetic chronic kidney disease: Secondary | ICD-10-CM | POA: Diagnosis present

## 2018-08-31 DIAGNOSIS — E039 Hypothyroidism, unspecified: Secondary | ICD-10-CM | POA: Diagnosis present

## 2018-08-31 DIAGNOSIS — J9601 Acute respiratory failure with hypoxia: Secondary | ICD-10-CM | POA: Diagnosis not present

## 2018-08-31 DIAGNOSIS — J40 Bronchitis, not specified as acute or chronic: Secondary | ICD-10-CM | POA: Diagnosis present

## 2018-08-31 DIAGNOSIS — K219 Gastro-esophageal reflux disease without esophagitis: Secondary | ICD-10-CM | POA: Diagnosis present

## 2018-08-31 LAB — BASIC METABOLIC PANEL
Anion gap: 9 (ref 5–15)
BUN: 34 mg/dL — ABNORMAL HIGH (ref 8–23)
CO2: 27 mmol/L (ref 22–32)
Calcium: 9.1 mg/dL (ref 8.9–10.3)
Chloride: 101 mmol/L (ref 98–111)
Creatinine, Ser: 1.47 mg/dL — ABNORMAL HIGH (ref 0.44–1.00)
GFR calc Af Amer: 37 mL/min — ABNORMAL LOW (ref >60)
GFR, EST NON AFRICAN AMERICAN: 32 mL/min — AB (ref >60)
Glucose, Bld: 251 mg/dL — ABNORMAL HIGH (ref 70–99)
Potassium: 4 mmol/L (ref 3.5–5.1)
Sodium: 137 mmol/L (ref 135–145)

## 2018-08-31 LAB — CBC
HCT: 40.1 % (ref 36.0–46.0)
Hemoglobin: 12.4 g/dL (ref 12.0–15.0)
MCH: 28.7 pg (ref 26.0–34.0)
MCHC: 30.9 g/dL (ref 30.0–36.0)
MCV: 92.8 fL (ref 80.0–100.0)
Platelets: 141 10*3/uL — ABNORMAL LOW (ref 150–400)
RBC: 4.32 MIL/uL (ref 3.87–5.11)
RDW: 13.8 % (ref 11.5–15.5)
WBC: 9.8 10*3/uL (ref 4.0–10.5)
nRBC: 0 % (ref 0.0–0.2)

## 2018-08-31 MED ORDER — IPRATROPIUM-ALBUTEROL 0.5-2.5 (3) MG/3ML IN SOLN
3.0000 mL | Freq: Four times a day (QID) | RESPIRATORY_TRACT | Status: DC
  Administered 2018-08-31 – 2018-09-01 (×8): 3 mL via RESPIRATORY_TRACT
  Filled 2018-08-31 (×9): qty 3

## 2018-08-31 MED ORDER — LEVOFLOXACIN 500 MG PO TABS
250.0000 mg | ORAL_TABLET | Freq: Every day | ORAL | Status: DC
  Administered 2018-08-31 – 2018-09-02 (×3): 250 mg via ORAL
  Filled 2018-08-31 (×3): qty 1

## 2018-08-31 NOTE — Progress Notes (Signed)
PT demonstrated hands on understanding of Flutter device- productive cough at this time.  

## 2018-08-31 NOTE — Progress Notes (Addendum)
Progress Note  Patient Name: Melissa Shannon Date of Encounter: 08/31/2018  Primary Cardiologist:  Loralie Champagne, MD   Subjective   Breathing better but says she did not realize she was that much more SOB than usual  No chest pain  Inpatient Medications    Scheduled Meds: . apixaban  2.5 mg Oral BID  . aspirin EC  81 mg Oral Daily  . calcium-vitamin D  1 tablet Oral BID  . cholecalciferol  5,000 Units Oral Daily  . dextromethorphan-guaiFENesin  2 tablet Oral BID  . donepezil  10 mg Oral QHS  . furosemide  20 mg Oral Daily  . insulin detemir  24 Units Subcutaneous Daily  . ipratropium-albuterol  3 mL Nebulization QID  . levothyroxine  100 mcg Oral Q0600  . lisinopril  10 mg Oral BID  . methylPREDNISolone (SOLU-MEDROL) injection  80 mg Intravenous Q8H  . multivitamin with minerals  1 tablet Oral Daily  . pantoprazole  40 mg Oral BID AC  . potassium chloride  20 mEq Oral Daily  . rosuvastatin  20 mg Oral Daily   Continuous Infusions: . levofloxacin (LEVAQUIN) IV     PRN Meds: acetaminophen **OR** acetaminophen, albuterol, ipratropium, menthol-cetylpyridinium, ondansetron **OR** ondansetron (ZOFRAN) IV, polyethylene glycol, polyvinyl alcohol, traMADol   Vital Signs    Vitals:   08/30/18 2012 08/30/18 2312 08/31/18 0500 08/31/18 0541  BP: 136/60   120/64  Pulse: 67   (!) 52  Resp: 16   16  Temp: 98.4 F (36.9 C)   97.8 F (36.6 C)  TempSrc: Oral   Oral  SpO2: 95% 93%  94%  Weight:   100.9 kg   Height:        Intake/Output Summary (Last 24 hours) at 08/31/2018 0836 Last data filed at 08/30/2018 1512 Gross per 24 hour  Intake 1100 ml  Output 700 ml  Net 400 ml   Filed Weights   08/30/18 0130 08/31/18 0500  Weight: 108.9 kg 100.9 kg    Telemetry    SR, SB, HR high 40s overnight - Personally Reviewed  ECG    None today - Personally Reviewed  Physical Exam   General: Well developed, well nourished, female appearing in no acute distress. Head:  Normocephalic, atraumatic.  Neck: Supple without bruits, JVD not seen elevated. Lungs:  Resp regular and unlabored, some rhonchi and wheeze, improved from yesterday Heart: RRR, S1, S2, no S3, S4, ?soft murmur; no rub. Abdomen: Soft, non-tender, non-distended with normoactive bowel sounds. No hepatomegaly. No rebound/guarding. No obvious abdominal masses. Extremities: No clubbing, cyanosis, no edema. Distal pedal pulses are 2+ bilaterally. Neuro: Alert and oriented X 3. Moves all extremities spontaneously. Psych: Normal affect.  Labs    Hematology Recent Labs  Lab 08/30/18 0224 08/31/18 0508  WBC 8.1 9.8  RBC 4.67 4.32  HGB 13.5 12.4  HCT 43.5 40.1  MCV 93.1 92.8  MCH 28.9 28.7  MCHC 31.0 30.9  RDW 13.7 13.8  PLT 131* 141*    Chemistry Recent Labs  Lab 08/30/18 0224 08/31/18 0508  NA 139 137  K 3.9 4.0  CL 103 101  CO2 28 27  GLUCOSE 122* 251*  BUN 26* 34*  CREATININE 1.23* 1.47*  CALCIUM 9.2 9.1  PROT 6.3*  --   ALBUMIN 3.5  --   AST 28  --   ALT 20  --   ALKPHOS 40  --   BILITOT 0.7  --   GFRNONAA 39* 32*  GFRAA 45* 37*  ANIONGAP 8 9     Cardiac Enzymes Recent Labs  Lab 08/30/18 0224 08/30/18 0936 08/30/18 1436  TROPONINI 0.43* 0.78* 0.80*      BNP Recent Labs  Lab 08/30/18 0224  BNP 20.5      Radiology    Dg Chest 2 View  Result Date: 08/30/2018 CLINICAL DATA:  Cough, chest congestion EXAM: CHEST - 2 VIEW COMPARISON:  01/07/2015 FINDINGS: Lungs are clear.  No pleural effusion or pneumothorax. The heart is normal in size. Degenerative changes of the visualized thoracolumbar spine. IMPRESSION: Normal chest radiographs. Electronically Signed   By: Julian Hy M.D.   On: 08/30/2018 02:33   Ct Angio Chest Pe W And/or Wo Contrast  Result Date: 08/30/2018 CLINICAL DATA:  Shortness of breath. EXAM: CT ANGIOGRAPHY CHEST WITH CONTRAST TECHNIQUE: Multidetector CT imaging of the chest was performed using the standard protocol during bolus  administration of intravenous contrast. Multiplanar CT image reconstructions and MIPs were obtained to evaluate the vascular anatomy. CONTRAST:  67mL ISOVUE-370 IOPAMIDOL (ISOVUE-370) INJECTION 76% COMPARISON:  Radiography same day. CT 09/11/2014. FINDINGS: Cardiovascular: Pulmonary arterial opacification is good. There are no pulmonary emboli. The heart is at the upper limits of normal in size. There is some coronary artery calcification. There is aortic atherosclerosis without aneurysm or dissection Mediastinum/Nodes: No mediastinal mass or lymphadenopathy. Lungs/Pleura: No pleural effusion. There is pronounced tracheal narrowing which could be due to tracheomalacia and or tracheobronchitis. Major bronchi are also narrowed with apparent wall thickening. Particular narrowing of the lower lobe bronchi right more than left. Atelectasis at the posterior inferior right lower lobe. Chronic bronchiectasis and or bronchial atresia in the right middle lobe as seen previously. Chronic small benign nodules in the lingula as seen previously. No upper lobe parenchymal finding other than a 3 mm nodule in the right upper lobe image 23 measuring 2.5 mm, likely to be benign. No underlying emphysema. Upper Abdomen: Chronic cyst in the left lobe of the liver. No significant upper abdominal finding. Patient does have a small hiatal hernia. Musculoskeletal: Ordinary spondylosis. Review of the MIP images confirms the above findings. IMPRESSION: 1. No pulmonary emboli. 2. Pronounced tracheal narrowing which could be due to tracheomalacia and/or tracheobronchitis. Narrowing and wall thickening also of the main bronchial branches, particularly in the right lower lobe. Partial atelectasis of the posterior inferior right lower lobe. 3. Chronic bronchiectasis and or bronchial atresia in the right middle lobe. 4. Coronary artery calcification. 5. Small hiatal hernia. Aortic Atherosclerosis (ICD10-I70.0). Electronically Signed   By: Nelson Chimes M.D.   On: 08/30/2018 06:35     Cardiac Studies   ECHO:  08/30/2018 - Left ventricle: The cavity size was normal. Wall thickness was   increased in a pattern of mild LVH. Systolic function was normal.   The estimated ejection fraction was in the range of 55% to 60%.   Wall motion was normal; there were no regional wall motion   abnormalities. Doppler parameters are consistent with abnormal   left ventricular relaxation (grade 1 diastolic dysfunction). - Aortic valve: There was mild stenosis. Valve area (VTI): 2.17   cm^2. Valve area (Vmax): 2.24 cm^2. Valve area (Vmean): 2.16   cm^2. - Mitral valve: Calcified annulus. There was mild regurgitation.   Valve area by pressure half-time: 2.5 cm^2.  Impressions:  - Normal LV systolic function; mild diastolic dysfunction; mild   LVH; mild AS (mean gradient 13 mmHg); mild MR and TR.  Patient Profile     85 y.o. female w/  hx of DM, HTN, HLD, OA, R heart failure, hypothyroid, PE 2015, was admitted 01/16 with SOB, cards consulted for the evaluation of elevated troponin   Assessment & Plan    1. Elevated troponin:  - in the setting of acute respiratory failure, but without chest pain - EF nl on echo w/ no RWMA - Trop range 0.43-0.80, trending up as of 01/16, MD advise if recheck needed - No ischemic sx, generally improving. - MD advise if any further workup is needed.  Otherwise, per IM Principal Problem:   Acute respiratory failure with hypoxia ([place]) Active Problems:   Hypertension   Hyperlipemia   Diabetes mellitus with renal manifestations, uncontrolled (HCC)   Chronic diastolic CHF (congestive heart failure) (HCC)   Dyspnea   CKD (chronic kidney disease), stage III (HCC)   Upper airway cough syndrome   Bronchiectasis with (acute) exacerbation (HCC)    Signed, Rosaria Ferries , PA-C 8:36 AM 08/31/2018 Pager: [phone]  I have seen and examined the patient along with Rosaria Ferries , PA-C.  I have reviewed  the chart, notes and new data.  I agree with PA/NP's note.  Key new complaints: breathing has improved, coughing still Key examination changes: normal CV exam Key new findings / data: fairly "flat" troponin trend, inconsistent with a true acute coronary event, no wall motion abnormalities on Echo. Although no comment made in the report, on my review the echo does not show elevated filling pressures (I.e. not in acute heart failure).  PLAN: Mild troponin elevation likely due to hypoxia prior to admission. No evidence for active ischemic heart disease or acute heart failure exacerbation. From a cardiac point of view, lisinopril is not a critical part of her treatment (suspect it was primarily prescribed for nephroprotective effect in DM(.   [facility] will sign off.   Medication Recommendations:  Continue apixaban 2.5 mg twice daily (therefore will not give ASA), statin, low dose furosemide. Other recommendations (labs, testing, etc):  No additional cardiac testing on this admission Follow up as an outpatient:  Last seen in CHF clinic in Sept 2019, plan was for 1 year follow up.   Sanda Klein, MD, [place] [phone] 08/31/2018, 10:32 AM

## 2018-08-31 NOTE — Consult Note (Signed)
PULMONARY / CRITICAL CARE MEDICINE   NAME:  Melissa Shannon, MRN:  [id number], DOB:  July 18, 1928, LOS: 0 ADMISSION DATE:  08/30/2018, CONSULTATION DATE:  08/30/18 REFERRING MD:  ed, CHIEF COMPLAINT:  Sob/hoarseness  BRIEF HISTORY:     89 yowf never smoker with h/o bronchiectsis/ suspected PE with transient PH resolved on anticoagulation on chronic coumadin and ACEi seen if office 02/27/18 with no cough and chronic doe attributed to obesity/ deconditioning and able to walk very slowly flat surface with rolling walker then acutely ill 08/27/17 with ST/ hoarseness, dry cough > er eval 1/15/ c/w viral uri and came back am 1/16 with sb at rest with upper and lower airway wheezing with CTa abn c/w ? Tracheitis/ tracheomalacia so PCCM service consulted.   HISTORY OF PRESENT ILLNESS   As above/ onset of present symptoms abrupt x 3 d prior to admit .  Prior w/u - See V/Q 06/13/14 intermediate  - echo 06/13/14 c/w RV strain, ? Acute on chronic  - Venous dopplers 06/14/14 neg both lower ext but tds  - echo 08/25/14 repeat Echo >  PAS down to 32 - Echo 10/16/14 repeat Echo > wnl  - baseline V/Q repeat requested 01/07/2015 > negative        STUDIES:   CTa 08/30/17 :  1. No pulmonary emboli. 2. Pronounced tracheal narrowing which could be due to tracheomalacia and/or tracheobronchitis. Narrowing and wall thickening also of the main bronchial branches, particularly in the right lower lobe. Partial atelectasis of the posterior inferior right lower lobe. 3. Chronic bronchiectasis and or bronchial atresia in the right middle lobe. 4. Coronary artery calcification. 5. Small hiatal hernia.  CULTURES:  None   ANTIBIOTICS:  Levaquin 1/16>>>    CONSULTANTS:  Cardiology for pos troponin 1/16    SUBJECTIVE:  "I'm all better and I want to go home"   CONSTITUTIONAL: BP 120/64 (BP Location: Left Arm)   Pulse (!) 52   Temp 97.8 F (36.6 C) (Oral)   Resp 16   Ht 5\' 8"  (1.727 m)   Wt 100.9 kg   SpO2  94%   BMI 33.83 kg/m   On 2lpm NP  I/O last 3 completed shifts: In: 1100 [P.O.:500; I.V.:500; IV Piggyback:100] Out: 700 [Urine:700]        PHYSICAL EXAM: Pt alert, approp nad @ sitting up eating bfast/ no longer hoarse/no stridor No jvd Oropharynx clear,  mucosa nl Neck supple Lungs with a minimal scattered exp > insp rhonchi bilaterally RRR no s3 or or sign murmur Abd obese with nlexcursion  Extr warm with no edema or clubbing noted Neuro  Sensorium intact ,  no apparent motor deficits s      ASSESSMENT AND PLAN     1) acute hypoxemic RF in setting of uri with lots of upper airway wheezing in pt on ACEi  - D/C'd acei on admit/ still on NP but dramatic improvement overnight  - ? Still 02 dep > titrate off as tol   2) Bronchiectasis flare related to URI - rx duoneb/ steroids/ flutter valve/ mucinex and levaquin empirically > change to po levquin, complete 5 days total  - will need f/u pfts in office   3) Likely ACEi effects superimposed on viral uri/ ? Reflux/ also on Fosfamax  - Although even in retrospect it may not be clear the ACEi contributed to the pt's symptoms,  Pt improved off them and adding them back at this point or in the future would risk confusion  in interpretation of non-specific respiratory symptoms to which this patient is prone  ie  Better not to muddy the waters here.  - Would also be careful with fosfamax here and low threshold to change to reclast or prolia  4) UACS on acei and fosfamax  - much better off ACEi and on gerd rx  / mucinex dm/flutter valve     rec  D/c per [facility] when appropriate / 02 rx if needed that point  rec f/u in pulmonary clinic in 2 weeks with all meds in hand using a trust but verify approach to confirm accurate Medication  Reconciliation The principal here is that until we are certain that the  patients are doing what we've asked, it makes no sense to ask them to do more.     Further inpt f/u as needed    Christinia Gully,  MD [facility] and [place] [phone] After 5:30 PM or weekends, use [facility] [phone]

## 2018-08-31 NOTE — Progress Notes (Signed)
PROGRESS NOTE    Melissa Shannon  JJH:[id number] DOB: 04/08/1928 DOA: 08/30/2018 PCP: Lauree Chandler, NP   Brief Narrative:85 y.o. female with past medical history of congestive heart failure, diabetes mellitus, hypertension, hyperlipidemia, hypothyroidism, history of pulmonary embolism in the past presented to the hospital with worsening cough and shortness of breath for the last 12 hours.  EMS was called in for this problem and was noted to have low oxygen and was started on oxygen supplementation.  Patient was recently diagnosed of pharyngitis/laryngitis and was conservatively treated for pharyngitis/laryngitis.  Patient has been having sore throat and difficulty swallowing with hoarseness of voice for last 3 to 4 days.  Patient however denies any fever, but has chills without rigors.  She denies any chest pain, palpitation but that shortness of breath.  Patient denies any urinary urgency, frequency or dysuria but is incontinent and he uses diapers.  Denies any nausea, vomiting or abdominal pain.  Denies any recent travel or sick contacts.  Currently uses a walker for ambulation at home and lives by herself.  ED Course: In the ED, patient was noted to have posterior oropharyngeal erythema.  She was noted to be hypoxic with 89% pulse ox on room air subsequently improved to 96% on 2 L.  BNP was within normal limits. troponin done in the ED was elevated at 0.4.  Strep throat swab obtained yesterday was negative.  Pulmonary and cardiology was notified from the ED.   Assessment & Plan:   Principal Problem:   Acute respiratory failure with hypoxia (HCC) Active Problems:   Hypertension   Hyperlipemia   Diabetes mellitus with renal manifestations, uncontrolled (HCC)   Chronic diastolic CHF (congestive heart failure) (HCC)   Dyspnea   CKD (chronic kidney disease), stage III (HCC)   Upper airway cough syndrome   Bronchiectasis with (acute) exacerbation (HCC)  Acute dyspnea, shortness of breath  with acute hypoxic respiratory failure-be secondary to ACE inhibitor induced.  However patient with significant improvement overnights since stopping the ACE. CT scan of the chest was negative for pulmonary embolism but pronounced tracheal narrowing, bronchial thickening possibility of tracheomalacia/tracheobronchitis.  Chronic bronchiectasis and bronchial atresia noted in the right middle lobe.  Patient was given nebulizers and supplemental oxygen with improvement in her oxygenation.    Continue current treatment Taper oxygen increase activity ambulate PT consult Still dependent on oxygen.  Chronic diastolic congestive heart failure.  BNP within normal limits.  She takes Lasix as outpatient.   We will continue Lasix while in the hospital  Chronic kidney disease stage IV.  Appears to be at baseline.  Last creatinine of 1.4- 83-month back.  Will closely monitor during hospitalization   Elevated troponin without chest pain.  No acute ischemic changes on EKG.  Cardiology was consulted.  Will follow cardiology recommendations.  Add aspirin to the regimen.    Echo done results pending  History of pulmonary embolism.  Patient is on anticoagulation with Eliquis.  We will continue with that.  Diabetes mellitus type 2.  We will put the patient on sliding scale insulin, Accu-Cheks, diabetic diet.  We will closely monitor.  Resume long-acting insulin.  History of mild dementia.  Continue donepezil  Hypertension.  Will closely monitor.  Resume outpatient medication.  On lisinopril  Hyperlipidemia. Continue Statins.   Hypothyroidism.  Continue Synthroid                    Estimated body mass index is 33.83 kg/m as calculated from  the following:   Height as of this encounter: 5\' 8"  (1.727 m).   Weight as of this encounter: 100.9 kg.  DVT prophylaxis:  Code Status:  Family Communication: Disposition Plan:   Consultants:    Procedures: Antimicrobials:    Subjective:  Objective: Vitals:   08/31/18 0541 08/31/18 0909 08/31/18 0928 08/31/18 1121  BP: 120/64 (!) 129/59    Pulse: (!) 52 65 66   Resp: 16 16 17    Temp: 97.8 F (36.6 C)     TempSrc: Oral     SpO2: 94% 94% 95% 96%  Weight:      Height:        Intake/Output Summary (Last 24 hours) at 08/31/2018 1206 Last data filed at 08/30/2018 1512 Gross per 24 hour  Intake 1000 ml  Output 700 ml  Net 300 ml   Filed Weights   08/30/18 0130 08/31/18 0500  Weight: 108.9 kg 100.9 kg    Examination:  General exam: Appears calm and comfortable  Respiratory system: Clear to auscultation. Respiratory effort normal. Cardiovascular system: S1 & S2 heard, RRR. No JVD, murmurs, rubs, gallops or clicks. No pedal edema. Gastrointestinal system: Abdomen is nondistended, soft and nontender. No organomegaly or masses felt. Normal bowel sounds heard. Central nervous system: Alert and oriented. No focal neurological deficits. Extremities: Symmetric 5 x 5 power. Skin: No rashes, lesions or ulcers Psychiatry: Judgement and insight appear normal. Mood & affect appropriate.     Data Reviewed: I have personally reviewed following labs and imaging studies  CBC: Recent Labs  Lab 08/30/18 0224 08/31/18 0508  WBC 8.1 9.8  NEUTROABS 6.4  --   HGB 13.5 12.4  HCT 43.5 40.1  MCV 93.1 92.8  PLT 131* 709*   Basic Metabolic Panel: Recent Labs  Lab 08/30/18 0224 08/31/18 0508  NA 139 137  K 3.9 4.0  CL 103 101  CO2 28 27  GLUCOSE 122* 251*  BUN 26* 34*  CREATININE 1.23* 1.47*  CALCIUM 9.2 9.1   GFR: Estimated Creatinine Clearance: 32.9 mL/min (A) (by C-G formula based on SCr of 1.47 mg/dL (H)). Liver Function Tests: Recent Labs  Lab 08/30/18 0224  AST 28  ALT 20  ALKPHOS 40  BILITOT 0.7  PROT 6.3*  ALBUMIN 3.5   No results for input(s): LIPASE, AMYLASE in the last 168 hours. No results for input(s): AMMONIA in the last 168 hours. Coagulation Profile: No results for  input(s): INR, PROTIME in the last 168 hours. Cardiac Enzymes: Recent Labs  Lab 08/30/18 0224 08/30/18 0936 08/30/18 1436  TROPONINI 0.43* 0.78* 0.80*   BNP (last 3 results) Recent Labs    02/27/18 1528  PROBNP 34.0   HbA1C: No results for input(s): HGBA1C in the last 72 hours. CBG: Recent Labs  Lab 08/30/18 1322  GLUCAP 240*   Lipid Profile: No results for input(s): CHOL, HDL, LDLCALC, TRIG, CHOLHDL, LDLDIRECT in the last 72 hours. Thyroid Function Tests: No results for input(s): TSH, T4TOTAL, FREET4, T3FREE, THYROIDAB in the last 72 hours. Anemia Panel: No results for input(s): VITAMINB12, FOLATE, FERRITIN, TIBC, IRON, RETICCTPCT in the last 72 hours. Sepsis Labs: No results for input(s): PROCALCITON, LATICACIDVEN in the last 168 hours.  Recent Results (from the past 240 hour(s))  Group A Strep by PCR     Status: None   Collection Time: 08/29/18  3:37 AM  Result Value Ref Range Status   Group A Strep by PCR NOT DETECTED NOT DETECTED Final    Comment: Performed at [facility]  [place] [address] [place], [place] [address]         Radiology Studies: Dg Chest 2 View  Result Date: 08/30/2018 CLINICAL DATA:  Cough, chest congestion EXAM: CHEST - 2 VIEW COMPARISON:  01/07/2015 FINDINGS: Lungs are clear.  No pleural effusion or pneumothorax. The heart is normal in size. Degenerative changes of the visualized thoracolumbar spine. IMPRESSION: Normal chest radiographs. Electronically Signed   By: Julian Hy M.D.   On: 08/30/2018 02:33   Ct Angio Chest Pe W And/or Wo Contrast  Result Date: 08/30/2018 CLINICAL DATA:  Shortness of breath. EXAM: CT ANGIOGRAPHY CHEST WITH CONTRAST TECHNIQUE: Multidetector CT imaging of the chest was performed using the standard protocol during bolus administration of intravenous contrast. Multiplanar CT image reconstructions and MIPs were obtained to evaluate the vascular anatomy. CONTRAST:  41mL ISOVUE-370 IOPAMIDOL  (ISOVUE-370) INJECTION 76% COMPARISON:  Radiography same day. CT 09/11/2014. FINDINGS: Cardiovascular: Pulmonary arterial opacification is good. There are no pulmonary emboli. The heart is at the upper limits of normal in size. There is some coronary artery calcification. There is aortic atherosclerosis without aneurysm or dissection Mediastinum/Nodes: No mediastinal mass or lymphadenopathy. Lungs/Pleura: No pleural effusion. There is pronounced tracheal narrowing which could be due to tracheomalacia and or tracheobronchitis. Major bronchi are also narrowed with apparent wall thickening. Particular narrowing of the lower lobe bronchi right more than left. Atelectasis at the posterior inferior right lower lobe. Chronic bronchiectasis and or bronchial atresia in the right middle lobe as seen previously. Chronic small benign nodules in the lingula as seen previously. No upper lobe parenchymal finding other than a 3 mm nodule in the right upper lobe image 23 measuring 2.5 mm, likely to be benign. No underlying emphysema. Upper Abdomen: Chronic cyst in the left lobe of the liver. No significant upper abdominal finding. Patient does have a small hiatal hernia. Musculoskeletal: Ordinary spondylosis. Review of the MIP images confirms the above findings. IMPRESSION: 1. No pulmonary emboli. 2. Pronounced tracheal narrowing which could be due to tracheomalacia and/or tracheobronchitis. Narrowing and wall thickening also of the main bronchial branches, particularly in the right lower lobe. Partial atelectasis of the posterior inferior right lower lobe. 3. Chronic bronchiectasis and or bronchial atresia in the right middle lobe. 4. Coronary artery calcification. 5. Small hiatal hernia. Aortic Atherosclerosis (ICD10-I70.0). Electronically Signed   By: Nelson Chimes M.D.   On: 08/30/2018 06:35        Scheduled Meds: . apixaban  2.5 mg Oral BID  . aspirin EC  81 mg Oral Daily  . calcium-vitamin D  1 tablet Oral BID  .  cholecalciferol  5,000 Units Oral Daily  . dextromethorphan-guaiFENesin  2 tablet Oral BID  . donepezil  10 mg Oral QHS  . furosemide  20 mg Oral Daily  . insulin detemir  24 Units Subcutaneous Daily  . ipratropium-albuterol  3 mL Nebulization QID  . levothyroxine  100 mcg Oral Q0600  . lisinopril  10 mg Oral BID  . methylPREDNISolone (SOLU-MEDROL) injection  80 mg Intravenous Q8H  . multivitamin with minerals  1 tablet Oral Daily  . pantoprazole  40 mg Oral BID AC  . potassium chloride  20 mEq Oral Daily  . rosuvastatin  20 mg Oral Daily   Continuous Infusions: . levofloxacin (LEVAQUIN) IV       LOS: 0 days     Georgette Shell, MD [facility]  If 7PM-7AM, please contact night-coverage [email or web address] Password [facility] 08/31/2018, 12:06 PM

## 2018-09-01 MED ORDER — ORAL CARE MOUTH RINSE: 15.0000 mL | Freq: Two times a day (BID) | OROMUCOSAL | Status: DC

## 2018-09-01 NOTE — Progress Notes (Signed)
SATURATION QUALIFICATIONS: (This note is used to comply with regulatory documentation for home oxygen)  Patient Saturations on Room Air at Rest = 96%  Patient Saturations on Room Air while Ambulating = 91%  Barbee Shropshire. Brigitte Pulse, RN

## 2018-09-01 NOTE — Progress Notes (Signed)
PROGRESS NOTE    Melissa Shannon  UDJ:[id number] DOB: Oct 02, 1927 DOA: 08/30/2018 PCP: Lauree Chandler, NP    Brief Narrative: 85 y.o.femalewith past medical history of congestive heart failure, diabetes mellitus, hypertension, hyperlipidemia, hypothyroidism, history of pulmonary embolism in the past presented to the hospital with worsening cough and shortness of breath for the last 12 hours. EMS was called in for this problem and was noted to have low oxygen and was started on oxygen supplementation. Patient was recently diagnosed of pharyngitis/laryngitis and was conservatively treated for pharyngitis/laryngitis. Patient has been having sore throat and difficulty swallowing with hoarseness of voice for last 3 to 4 days. Patient however denies any fever,but has chills without rigors.She denies any chest pain,palpitation but that shortness of breath. Patient denies any urinary urgency, frequency or dysuriabut is incontinent and he uses diapers. Denies any nausea, vomiting or abdominal pain. Denies any recent travel or sick contacts.Currently uses a walker for ambulation at home and lives by herself. ED Course:In the ED, patient was noted to have posterior oropharyngeal erythema.She was noted to be hypoxic with 89% pulse ox on room air subsequently improved to 96% on 2 L. BNP was within normal limits.troponin done in the ED was elevated at 0.4. Strep throat swab obtained yesterday was negative. Pulmonary and cardiology was notified from the ED.  Assessment & Plan:   Principal Problem:   Acute respiratory failure with hypoxia (HCC) Active Problems:   Hypertension   Hyperlipemia   Diabetes mellitus with renal manifestations, uncontrolled (HCC)   Chronic diastolic CHF (congestive heart failure) (HCC)   Dyspnea   CKD (chronic kidney disease), stage III (HCC)   Upper airway cough syndrome   Bronchiectasis with (acute) exacerbation (HCC)   Acute on chronic respiratory failure  with hypoxia (HCC)   Acute dyspnea, shortness of breath withacutehypoxic respiratoryfailure-be secondary to ACE inhibitor induced.  However patient with significant improvement overnights since stopping the ACE.CT scan of the chestwas negative for pulmonary embolismbutpronounced tracheal narrowing,bronchial thickeningpossibility of tracheomalacia/tracheobronchitis. 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  Still dependent on oxygen.  Chronic diastolic congestive heart failure. BNP within normal limits. She takes Lasix as outpatient.  We will continue Lasix while in the hospital  Chronic kidney disease stage IV. Appears to be at baseline. Last creatinine of 1.4-57-month back.Will closely monitor during hospitalization  Elevated troponin without chest pain. No acute ischemic changes on EKG. Cardiology was consulted. Will follow cardiology recommendations.Add aspirin to the regimen.   Echo done results mild diastolic dysfunction normal ef  History of pulmonary embolism. Patient is on anticoagulationwith Eliquis. We will continue with that.    Estimated body mass index is 34.22 kg/m as calculated from the following:   Height as of this encounter: 5\' 8"  (1.727 m).   Weight as of this encounter: 102.1 kg.  DVT prophylaxis: eliquis Code Status:full Family Communication: none Disposition Plan: Plan dc if wheezing is better in am Consultants: pccm  Procedures: none Antimicrobials: none Subjective: Still feels sob whhezy  Objective: Vitals:   09/01/18 0552 09/01/18 0951 09/01/18 1246 09/01/18 1340  BP: (!) 165/75  (!) 145/74   Pulse: 63  86   Resp: 17  18   Temp: 97.7 F (36.5 C)  98.3 F (36.8 C)   TempSrc: Oral  Oral   SpO2: 96% 96% 93% 94%  Weight:      Height:  No intake or output data in the 24 hours ending 09/01/18  1409 Filed Weights   08/30/18 0130 08/31/18 0500 09/01/18 0500  Weight: 108.9 kg 100.9 kg 102.1 kg    Examination:  General exam: Appears calm and comfortable  Respiratory system:  Diffuse wheezing to auscultation. Respiratory effort normal. Cardiovascular system: S1 & S2 heard, RRR. No JVD, murmurs, rubs, gallops or clicks. No pedal edema. Gastrointestinal system: Abdomen is nondistended, soft and nontender. No organomegaly or masses felt. Normal bowel sounds heard. Central nervous system: Alert and oriented. No focal neurological deficits. Extremities: trace edema Skin: No rashes, lesions or ulcers Psychiatry: Judgement and insight appear normal. Mood & affect appropriate.     Data Reviewed: I have personally reviewed following labs and imaging studies  CBC: Recent Labs  Lab 08/30/18 0224 08/31/18 0508  WBC 8.1 9.8  NEUTROABS 6.4  --   HGB 13.5 12.4  HCT 43.5 40.1  MCV 93.1 92.8  PLT 131* 315*   Basic Metabolic Panel: Recent Labs  Lab 08/30/18 0224 08/31/18 0508  NA 139 137  K 3.9 4.0  CL 103 101  CO2 28 27  GLUCOSE 122* 251*  BUN 26* 34*  CREATININE 1.23* 1.47*  CALCIUM 9.2 9.1   GFR: Estimated Creatinine Clearance: 33.1 mL/min (A) (by C-G formula based on SCr of 1.47 mg/dL (H)). Liver Function Tests: Recent Labs  Lab 08/30/18 0224  AST 28  ALT 20  ALKPHOS 40  BILITOT 0.7  PROT 6.3*  ALBUMIN 3.5   No results for input(s): LIPASE, AMYLASE in the last 168 hours. No results for input(s): AMMONIA in the last 168 hours. Coagulation Profile: No results for input(s): INR, PROTIME in the last 168 hours. Cardiac Enzymes: Recent Labs  Lab 08/30/18 0224 08/30/18 0936 08/30/18 1436  TROPONINI 0.43* 0.78* 0.80*   BNP (last 3 results) Recent Labs    02/27/18 1528  PROBNP 34.0   HbA1C: No results for input(s): HGBA1C in the last 72 hours. CBG: Recent Labs  Lab 08/30/18 1322  GLUCAP 240*   Lipid Profile: No results for input(s): CHOL, HDL,  LDLCALC, TRIG, CHOLHDL, LDLDIRECT in the last 72 hours. Thyroid Function Tests: No results for input(s): TSH, T4TOTAL, FREET4, T3FREE, THYROIDAB in the last 72 hours. Anemia Panel: No results for input(s): VITAMINB12, FOLATE, FERRITIN, TIBC, IRON, RETICCTPCT in the last 72 hours. Sepsis Labs: No results for input(s): PROCALCITON, LATICACIDVEN in the last 168 hours.  Recent Results (from the past 240 hour(s))  Group A Strep by PCR     Status: None   Collection Time: 08/29/18  3:37 AM  Result Value Ref Range Status   Group A Strep by PCR NOT DETECTED NOT DETECTED Final    Comment: Performed at [facility], [place] [address] [place], [place] [address]         Radiology Studies: No results found.      Scheduled Meds: . apixaban  2.5 mg Oral BID  . aspirin EC  81 mg Oral Daily  . calcium-vitamin D  1 tablet Oral BID  . cholecalciferol  5,000 Units Oral Daily  . dextromethorphan-guaiFENesin  2 tablet Oral BID  . donepezil  10 mg Oral QHS  . furosemide  20 mg Oral Daily  . insulin detemir  24 Units Subcutaneous Daily  . ipratropium-albuterol  3 mL Nebulization QID  . levofloxacin  250 mg Oral Daily  . levothyroxine  100 mcg Oral Q0600  . lisinopril  10 mg Oral BID  .  mouth rinse  15 mL Mouth Rinse BID  . methylPREDNISolone (SOLU-MEDROL) injection  80 mg Intravenous Q8H  . multivitamin with minerals  1 tablet Oral Daily  . pantoprazole  40 mg Oral BID AC  . potassium chloride  20 mEq Oral Daily  . rosuvastatin  20 mg Oral Daily   Continuous Infusions:   LOS: 1 day      Georgette Shell, MD [facility]  If 7PM-7AM, please contact night-coverage [email or web address] Password [email or web address] 09/01/2018, 2:09 PM

## 2018-09-01 NOTE — Evaluation (Signed)
Physical Therapy Evaluation Patient Details Name: Melissa Shannon MRN: [id number] DOB: 1928-04-12 Today's Date: 09/01/2018   History of Present Illness  85 yo female admitted with acute respiratory failure-ace inhibitor induced. Hx of CHF, DM, PE, OA, hypothyroidism  Clinical Impression  On eval, pt was Min guard-Min assist for mobility. She walked ~115 feet with a RW. O2 sat 93% on RA during ambulation. Discussed d/c plan-pt will return home with home health aides and family assisting as necessary. Will recommend HHPT f/u-pt will consider it. Will follow during hospital stay.     Follow Up Recommendations Home health PT;Supervision - Intermittent (if pt decides she wants f/u)    Equipment Recommendations  None recommended by PT    Recommendations for Other Services       Precautions / Restrictions Precautions Precautions: Fall Restrictions Weight Bearing Restrictions: No      Mobility  Bed Mobility Overal bed mobility: Needs Assistance Bed Mobility: Supine to Sit;Sit to Supine     Supine to sit: Min guard;HOB elevated Sit to supine: Min assist;HOB elevated   General bed mobility comments: Assist for LEs. Increased time.   Transfers Overall transfer level: Needs assistance Equipment used: Rolling walker (2 wheeled) Transfers: Sit to/from Stand Sit to Stand: Min guard         General transfer comment: Close guard for safety. VCs hand placement  Ambulation/Gait Ambulation/Gait assistance: Min guard Gait Distance (Feet): 115 Feet Assistive device: Rolling walker (2 wheeled) Gait Pattern/deviations: Step-through pattern;Decreased stride length     General Gait Details: slow gaist speed. no lob with RW use. O2 sat 93% on RA. Very mild dyspnea  Stairs            Wheelchair Mobility    Modified Rankin (Stroke Patients Only)       Balance Overall balance assessment: Needs assistance           Standing balance-Leahy Scale: Fair                                Pertinent Vitals/Pain Pain Assessment: No/denies pain    Home Living Family/patient expects to be discharged to:: Private residence Living Arrangements: Alone Available Help at Discharge: Family;Available PRN/intermittently Type of Home: House Home Access: Stairs to enter Entrance Stairs-Rails: Right Entrance Stairs-Number of Steps: 2 Home Layout: Multi-level Home Equipment: Walker - 2 wheels;Cane - single point      Prior Function Level of Independence: Independent with assistive device(s)         Comments: uses cane vs RW     Hand Dominance        Extremity/Trunk Assessment   Upper Extremity Assessment Upper Extremity Assessment: Generalized weakness    Lower Extremity Assessment Lower Extremity Assessment: Generalized weakness    Cervical / Trunk Assessment Cervical / Trunk Assessment: Normal  Communication   Communication: No difficulties  Cognition Arousal/Alertness: Awake/alert Behavior During Therapy: WFL for tasks assessed/performed Overall Cognitive Status: Within Functional Limits for tasks assessed                                        General Comments      Exercises     Assessment/Plan    PT Assessment Patient needs continued PT services  PT Problem List Decreased mobility;Decreased balance;Decreased strength       PT Treatment Interventions DME  instruction;Gait training;Functional mobility training;Therapeutic activities;Balance training;Patient/family education;Therapeutic exercise    PT Goals (Current goals can be found in the Care Plan section)  Acute Rehab PT Goals Patient Stated Goal: home PT Goal Formulation: With patient/family Time For Goal Achievement: 09/15/18 Potential to Achieve Goals: Good    Frequency Min 3X/week   Barriers to discharge        Co-evaluation               AM-PAC PT "6 Clicks" Mobility  Outcome Measure Help needed turning from your back to your  side while in a flat bed without using bedrails?: A Little Help needed moving from lying on your back to sitting on the side of a flat bed without using bedrails?: A Little Help needed moving to and from a bed to a chair (including a wheelchair)?: A Little Help needed standing up from a chair using your arms (e.g., wheelchair or bedside chair)?: A Little Help needed to walk in hospital room?: A Little Help needed climbing 3-5 steps with a railing? : A Lot 6 Click Score: 17    End of Session Equipment Utilized During Treatment: Gait belt Activity Tolerance: Patient tolerated treatment well Patient left: in bed;with call bell/phone within reach;with family/visitor present;with bed alarm set   PT Visit Diagnosis: Unsteadiness on feet (R26.81);Muscle weakness (generalized) (M62.81)    Time: 7048-8891 PT Time Calculation (min) (ACUTE ONLY): 27 min   Charges:   PT Evaluation $PT Eval Moderate Complexity: 1 Mod PT Treatments $Gait Training: 8-22 mins          Weston Anna, PT Acute Rehabilitation Services Pager: [phone] Office: [phone] '

## 2018-09-02 MED ORDER — ASPIRIN 81 MG PO TBEC: 81.0000 mg | DELAYED_RELEASE_TABLET | Freq: Every day | ORAL | Status: DC

## 2018-09-02 MED ORDER — PREDNISONE 10 MG PO TABS: ORAL_TABLET | ORAL | 0 refills | Status: DC

## 2018-09-02 MED ORDER — FUROSEMIDE 20 MG PO TABS: 20.0000 mg | ORAL_TABLET | Freq: Every day | ORAL | 1 refills | Status: DC

## 2018-09-02 MED ORDER — AMLODIPINE BESYLATE 5 MG PO TABS: 5.0000 mg | ORAL_TABLET | Freq: Every day | ORAL | 1 refills | Status: DC

## 2018-09-02 MED ORDER — AMLODIPINE BESYLATE 5 MG PO TABS
5.0000 mg | ORAL_TABLET | Freq: Every day | ORAL | Status: DC
  Administered 2018-09-02: 5 mg via ORAL
  Filled 2018-09-02: qty 1

## 2018-09-02 MED ORDER — PANTOPRAZOLE SODIUM 40 MG PO TBEC: 40.0000 mg | DELAYED_RELEASE_TABLET | Freq: Every day | ORAL | 0 refills | Status: DC

## 2018-09-02 MED ORDER — POTASSIUM CHLORIDE CRYS ER 20 MEQ PO TBCR: 20.0000 meq | EXTENDED_RELEASE_TABLET | Freq: Every day | ORAL | 0 refills | Status: DC

## 2018-09-02 NOTE — Discharge Summary (Signed)
Physician Discharge Summary  Melissa Shannon HWE:[id number] DOB: 1928/01/06 DOA: 08/30/2018  PCP: Lauree Chandler, NP  Admit date: 08/30/2018 Discharge date: 09/02/2018  Admitted From: Home Disposition: Home Recommendations for Outpatient Follow-up:  1. Follow up with PCP in 1-2 weeks 2. Please obtain BMP/CBC in one week 3.   Home Health PT Equipment/Devices none  Discharge Condition stable CODE STATUS full code Diet recommendation: Cardiac Brief/Interim Summary:85 y.o.femalewith past medical history of congestive heart failure, diabetes mellitus, hypertension, hyperlipidemia, hypothyroidism, history of pulmonary embolism in the past presented to the hospital with worsening cough and shortness of breath for the last 12 hours. EMS was called in for this problem and was noted to have low oxygen and was started on oxygen supplementation. Patient was recently diagnosed of pharyngitis/laryngitis and was conservatively treated for pharyngitis/laryngitis. Patient has been having sore throat and difficulty swallowing with hoarseness of voice for last 3 to 4 days. Patient however denies any fever,but has chills without rigors.She denies any chest pain,palpitation but that shortness of breath. Patient denies any urinary urgency, frequency or dysuriabut is incontinent and he uses diapers. Denies any nausea, vomiting or abdominal pain. Denies any recent travel or sick contacts.Currently uses a walker for ambulation at home and lives by herself. ED Course:In the ED, patient was noted to have posterior oropharyngeal erythema.She was noted to be hypoxic with 89% pulse ox on room air subsequently improved to 96% on 2 L. BNP was within normal limits.troponin done in the ED was elevated at 0.4. Strep throat swab obtained yesterday was negative. Pulmonary and cardiology was notified from the ED.  Discharge Diagnoses:  Principal Problem:   Acute respiratory failure with hypoxia  (HCC) Active Problems:   Hypertension   Hyperlipemia   Diabetes mellitus with renal manifestations, uncontrolled (HCC)   Chronic diastolic CHF (congestive heart failure) (HCC)   Dyspnea   CKD (chronic kidney disease), stage III (HCC)   Upper airway cough syndrome   Bronchiectasis with (acute) exacerbation (HCC)   Acute on chronic respiratory failure with hypoxia (HCC)  Acute dyspnea, shortness of breath withacutehypoxic respiratoryfailure-thought to be secondary to ACE inhibitor induced.  Patient was seen in consultation by Dr. Melvyn Novas as well as lab our cardiology.  Patient's condition improved after stopping the ACE inhibitor.CT scan of the chestwas negative for pulmonary embolismbutpronounced tracheal narrowing,bronchial thickeningpossibility of tracheomalacia/tracheobronchitis. 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 She ambulated in the hallway on the day of discharge saturation above 92% on room air.  Patient has no complaints today and anxious to go home.  Chronic diastolic congestive heart failure. BNP within normal limits.  Continue Lasix.  Patient takes Lasix 10 mg at home the dose was increased to 20 mg at the time of discharge and during the hospital stay.   Chronic kidney disease stage IV. Appears to be at baseline. Last creatinine of 1.4-56-monthback.  Elevated troponin without chest pain. No acute ischemic changes on EKG. Cardiology was consulted.Echo done results mild diastolic dysfunction normal ef  History of pulmonary embolism. Patient is on anticoagulationwith Eliquis.   Hypertension patient took an ACE inhibitor and Lasix at home since ACE inhibitor was stopped she was started on Norvasc 5 mg daily.  Estimated body mass index is 33.96 kg/m as calculated from the following:   Height as of this encounter: '5\' 8"'$  (1.727  m).   Weight as of this encounter: 101.3 kg.  Discharge  Instructions  Discharge Instructions    Call MD for:  difficulty breathing, headache or visual disturbances   Complete by:  As directed    Call MD for:  persistant nausea and vomiting   Complete by:  As directed    Call MD for:  severe uncontrolled pain   Complete by:  As directed    Diet - low sodium heart healthy   Complete by:  As directed    Increase activity slowly   Complete by:  As directed      Allergies as of 09/02/2018      Reactions   Penicillins Other (See Comments)   DID THE REACTION INVOLVE: Swelling of the face/tongue/throat, SOB, or low BP? Yes Sudden or severe rash/hives, skin peeling, or the inside of the mouth or nose? No Did it require medical treatment? No When did it last happen? If all above answers are "NO", may proceed with cephalosporin use. Throat felt tight      Medication List    STOP taking these medications   lisinopril 10 MG tablet Commonly known as:  PRINIVIL,ZESTRIL     TAKE these medications   alendronate 70 MG tablet Commonly known as:  FOSAMAX TAKE 1 TABLET BY MOUTH ONCE WEEKLY TAKE WITH FULL GLASS OF WATER ON AN EMPTY STOMACH What changed:  See the new instructions.   amLODipine 5 MG tablet Commonly known as:  NORVASC Take 1 tablet (5 mg total) by mouth daily.   aspirin 81 MG EC tablet Take 1 tablet (81 mg total) by mouth daily.   BD PEN NEEDLE NANO U/F 32G X 4 MM Misc Generic drug:  Insulin Pen Needle USE AS DIRECTED ONCE DAILY   CALTRATE 600+D 600-400 MG-UNIT tablet Generic drug:  Calcium Carbonate-Vitamin D Take 1 tablet by mouth 2 (two) times daily.   donepezil 10 MG tablet Commonly known as:  ARICEPT TAKE ONE TABLET BY MOUTH ONCE DAILY AT BEDTIME TO PRESERVE MEMORY What changed:  See the new instructions.   ELIQUIS 5 MG Tabs tablet Generic drug:  apixaban TAKE 1 TABLET BY MOUTH TWICE A DAY What changed:  how much to take   feeding supplement  (ENSURE COMPLETE) Liqd Take 237 mLs by mouth 2 (two) times daily between meals as needed (As requested by patient). What changed:  when to take this   furosemide 20 MG tablet Commonly known as:  LASIX Take 1 tablet (20 mg total) by mouth daily. What changed:  how much to take   hydroxypropyl methylcellulose / hypromellose 2.5 % ophthalmic solution Commonly known as:  ISOPTO TEARS / GONIOVISC Place 1 drop into both eyes 3 (three) times daily as needed for dry eyes.   LEVEMIR FLEXTOUCH 100 UNIT/ML Pen Generic drug:  Insulin Detemir INJECT 24 UNITS INTO THE SKIN DAILY. What changed:  See the new instructions.   levothyroxine 100 MCG tablet Commonly known as:  SYNTHROID, LEVOTHROID TAKE 1 TABLET BY MOUTH EVERY DAY BEFORE BREAKFAST What changed:  See the new instructions.   multivitamin with minerals Tabs tablet Take 1 tablet by mouth daily.   ONE TOUCH ULTRA 2 w/Device Kit 1 Device by Does not apply route daily. Dx: E11.22   ONE TOUCH ULTRA TEST test strip Generic drug:  glucose blood Use every other day to test blood sugar   ONETOUCH DELICA LANCETS 94H Misc Use every other day to test blood sugar. Dx: E11.22   pantoprazole 40 MG tablet Commonly known as:  PROTONIX Take 1 tablet (40 mg total)  by mouth daily.   potassium chloride SA 20 MEQ tablet Commonly known as:  K-DUR,KLOR-CON Take 1 tablet (20 mEq total) by mouth daily.   predniSONE 10 MG tablet Commonly known as:  DELTASONE Take 30 mg for the first 3 days then 20 mg for the next 3 days and then 10 mg daily till done.   rosuvastatin 20 MG tablet Commonly known as:  CRESTOR TAKE 1 TABLET EVERY DAY FOR CHOLESTEROL What changed:    how much to take  how to take this  when to take this  additional instructions   TRADJENTA 5 MG Tabs tablet Generic drug:  linagliptin TAKE 1 TABLET BY MOUTH EVERY DAY What changed:  how much to take   Vitamin D3 125 MCG (5000 UT) Chew Chew 5,000 Units by mouth daily.       Follow-up Information    Lauree Chandler, NP Follow up.   Specialty:  Geriatric Medicine Contact information: [place]. [place] [address] [phone]        Larey Dresser, MD .   Specialty:  Cardiology Contact information: [address] [place] [address] [phone]          Allergies  Allergen Reactions  . Penicillins Other (See Comments)    DID THE REACTION INVOLVE: Swelling of the face/tongue/throat, SOB, or low BP? Yes Sudden or severe rash/hives, skin peeling, or the inside of the mouth or nose? No Did it require medical treatment? No When did it last happen? If all above answers are "NO", may proceed with cephalosporin use.   Throat felt tight    Consultations:  PCCM and cardiology   Procedures/Studies: Dg Chest 2 View  Result Date: 08/30/2018 CLINICAL DATA:  Cough, chest congestion EXAM: CHEST - 2 VIEW COMPARISON:  01/07/2015 FINDINGS: Lungs are clear.  No pleural effusion or pneumothorax. The heart is normal in size. Degenerative changes of the visualized thoracolumbar spine. IMPRESSION: Normal chest radiographs. Electronically Signed   By: Julian Hy M.D.   On: 08/30/2018 02:33   Ct Angio Chest Pe W And/or Wo Contrast  Result Date: 08/30/2018 CLINICAL DATA:  Shortness of breath. EXAM: CT ANGIOGRAPHY CHEST WITH CONTRAST TECHNIQUE: Multidetector CT imaging of the chest was performed using the standard protocol during bolus administration of intravenous contrast. Multiplanar CT image reconstructions and MIPs were obtained to evaluate the vascular anatomy. CONTRAST:  74m ISOVUE-370 IOPAMIDOL (ISOVUE-370) INJECTION 76% COMPARISON:  Radiography same day. CT 09/11/2014. FINDINGS: Cardiovascular: Pulmonary arterial opacification is good. There are no pulmonary emboli. The heart is at the upper limits of normal in size. There is some coronary artery calcification. There is aortic atherosclerosis without aneurysm or dissection  Mediastinum/Nodes: No mediastinal mass or lymphadenopathy. Lungs/Pleura: No pleural effusion. There is pronounced tracheal narrowing which could be due to tracheomalacia and or tracheobronchitis. Major bronchi are also narrowed with apparent wall thickening. Particular narrowing of the lower lobe bronchi right more than left. Atelectasis at the posterior inferior right lower lobe. Chronic bronchiectasis and or bronchial atresia in the right middle lobe as seen previously. Chronic small benign nodules in the lingula as seen previously. No upper lobe parenchymal finding other than a 3 mm nodule in the right upper lobe image 23 measuring 2.5 mm, likely to be benign. No underlying emphysema. Upper Abdomen: Chronic cyst in the left lobe of the liver. No significant upper abdominal finding. Patient does have a small hiatal hernia. Musculoskeletal: Ordinary spondylosis. Review of the MIP images confirms the above findings.  IMPRESSION: 1. No pulmonary emboli. 2. Pronounced tracheal narrowing which could be due to tracheomalacia and/or tracheobronchitis. Narrowing and wall thickening also of the main bronchial branches, particularly in the right lower lobe. Partial atelectasis of the posterior inferior right lower lobe. 3. Chronic bronchiectasis and or bronchial atresia in the right middle lobe. 4. Coronary artery calcification. 5. Small hiatal hernia. Aortic Atherosclerosis (ICD10-I70.0). Electronically Signed   By: Nelson Chimes M.D.   On: 08/30/2018 06:35    (Echo, Carotid, EGD, Colonoscopy, ERCP)    Subjective:   Discharge Exam: Vitals:   09/02/18 0525 09/02/18 0915  BP: (!) 147/99   Pulse: 69 78  Resp: 16   Temp: 98.2 F (36.8 C) 97.6 F (36.4 C)  SpO2: 94% 94%   Vitals:   09/01/18 2052 09/01/18 2204 09/02/18 0525 09/02/18 0915  BP:  (!) 175/74 (!) 147/99   Pulse:  91 69 78  Resp:  16 16   Temp:  98.8 F (37.1 C) 98.2 F (36.8 C) 97.6 F (36.4 C)  TempSrc:  Oral Oral Oral  SpO2: 92% 93% 94%  94%  Weight:   101.3 kg   Height:        General: Pt is alert, awake, not in acute distress Cardiovascular: RRR, S1/S2 +, no rubs, no gallops Respiratory: CTA bilaterally, no wheezing, no rhonchi Abdominal: Soft, NT, ND, bowel sounds + Extremities: no edema, no cyanosis    The results of significant diagnostics from this hospitalization (including imaging, microbiology, ancillary and laboratory) are listed below for reference.     Microbiology: Recent Results (from the past 240 hour(s))  Group A Strep by PCR     Status: None   Collection Time: 08/29/18  3:37 AM  Result Value Ref Range Status   Group A Strep by PCR NOT DETECTED NOT DETECTED Final    Comment: Performed at [facility], [place] [address], [place], [place] [address]     Labs: BNP (last 3 results) Recent Labs    08/30/18 0224  BNP 21.8   Basic Metabolic Panel: Recent Labs  Lab 08/30/18 0224 08/31/18 0508  NA 139 137  K 3.9 4.0  CL 103 101  CO2 28 27  GLUCOSE 122* 251*  BUN 26* 34*  CREATININE 1.23* 1.47*  CALCIUM 9.2 9.1   Liver Function Tests: Recent Labs  Lab 08/30/18 0224  AST 28  ALT 20  ALKPHOS 40  BILITOT 0.7  PROT 6.3*  ALBUMIN 3.5   No results for input(s): LIPASE, AMYLASE in the last 168 hours. No results for input(s): AMMONIA in the last 168 hours. CBC: Recent Labs  Lab 08/30/18 0224 08/31/18 0508  WBC 8.1 9.8  NEUTROABS 6.4  --   HGB 13.5 12.4  HCT 43.5 40.1  MCV 93.1 92.8  PLT 131* 141*   Cardiac Enzymes: Recent Labs  Lab 08/30/18 0224 08/30/18 0936 08/30/18 1436  TROPONINI 0.43* 0.78* 0.80*   BNP: Invalid input(s): POCBNP CBG: Recent Labs  Lab 08/30/18 1322  GLUCAP 240*   D-Dimer No results for input(s): DDIMER in the last 72 hours. Hgb A1c No results for input(s): HGBA1C in the last 72 hours. Lipid Profile No results for input(s): CHOL, HDL, LDLCALC, TRIG, CHOLHDL, LDLDIRECT in the last 72 hours. Thyroid function studies No results  for input(s): TSH, T4TOTAL, T3FREE, THYROIDAB in the last 72 hours.  Invalid input(s): FREET3 Anemia work up No results for input(s): VITAMINB12, FOLATE, FERRITIN, TIBC, IRON, RETICCTPCT in the last 72 hours. Urinalysis  Component Value Date/Time   COLORURINE YELLOW 11/28/2014 2200   APPEARANCEUR CLOUDY (A) 11/28/2014 2200   LABSPEC 1.017 11/28/2014 2200   PHURINE 6.0 11/28/2014 2200   GLUCOSEU NEGATIVE 11/28/2014 2200   HGBUR NEGATIVE 11/28/2014 2200   BILIRUBINUR NEGATIVE 11/28/2014 2200   KETONESUR NEGATIVE 11/28/2014 2200   PROTEINUR NEGATIVE 11/28/2014 2200   UROBILINOGEN 0.2 11/28/2014 2200   NITRITE POSITIVE (A) 11/28/2014 2200   LEUKOCYTESUR LARGE (A) 11/28/2014 2200   Sepsis Labs Invalid input(s): PROCALCITONIN,  WBC,  LACTICIDVEN Microbiology Recent Results (from the past 240 hour(s))  Group A Strep by PCR     Status: None   Collection Time: 08/29/18  3:37 AM  Result Value Ref Range Status   Group A Strep by PCR NOT DETECTED NOT DETECTED Final    Comment: Performed at [facility], [place] [address] [place], [place] [address]     Time coordinating discharge:47mnutes  SIGNED:   EGeorgette Shell MD  [facility] 09/02/2018, 10:29 AM Pager   If 7PM-7AM, please contact night-coverage [email or web address] Password [email or web address]

## 2018-09-02 NOTE — Progress Notes (Signed)
Reviewed discharge information with patient and daughter. Answered all questions. Patient able to teach back medications and reasons to contact MD/911. Patient verbalizes importance of PCP follow up appointment. Patient declined HHPT. Barbee Shropshire. Brigitte Pulse, RN

## 2018-09-02 NOTE — Care Management (Signed)
This CM spoke with pt at bedside for dc planning. Pt states she has aides with [facility] currently at home and is not interested in additional home health services at this time. Home Health provider list left with pt. Pt states she has a RW at home and does not want a 3in1. Marney Doctor RN,BSN [phone]

## 2018-09-03 ENCOUNTER — Emergency Department (HOSPITAL_COMMUNITY): Payer: Medicare Other

## 2018-09-03 ENCOUNTER — Ambulatory Visit (INDEPENDENT_AMBULATORY_CARE_PROVIDER_SITE_OTHER): Payer: Medicare Other | Admitting: Family

## 2018-09-03 ENCOUNTER — Emergency Department (HOSPITAL_COMMUNITY)
Admission: EM | Admit: 2018-09-03 | Discharge: 2018-09-03 | Disposition: A | Discharge status: Home / Self Care | Payer: Medicare Other | Attending: Emergency Medicine | Admitting: Emergency Medicine

## 2018-09-03 ENCOUNTER — Encounter (HOSPITAL_COMMUNITY): Payer: Self-pay | Admitting: Emergency Medicine

## 2018-09-03 ENCOUNTER — Encounter: Payer: Self-pay | Admitting: Family

## 2018-09-03 ENCOUNTER — Other Ambulatory Visit: Payer: Self-pay

## 2018-09-03 VITALS — BP 130/80 | HR 66 | Temp 97.5°F | Resp 18 | Ht 68.0 in

## 2018-09-03 DIAGNOSIS — I5032 Chronic diastolic (congestive) heart failure: Secondary | ICD-10-CM | POA: Insufficient documentation

## 2018-09-03 DIAGNOSIS — Z7901 Long term (current) use of anticoagulants: Secondary | ICD-10-CM | POA: Insufficient documentation

## 2018-09-03 DIAGNOSIS — R112 Nausea with vomiting, unspecified: Secondary | ICD-10-CM

## 2018-09-03 DIAGNOSIS — Z79899 Other long term (current) drug therapy: Secondary | ICD-10-CM | POA: Diagnosis not present

## 2018-09-03 DIAGNOSIS — N183 Chronic kidney disease, stage 3 (moderate): Secondary | ICD-10-CM | POA: Insufficient documentation

## 2018-09-03 DIAGNOSIS — R5383 Other fatigue: Secondary | ICD-10-CM | POA: Diagnosis not present

## 2018-09-03 DIAGNOSIS — R531 Weakness: Secondary | ICD-10-CM | POA: Insufficient documentation

## 2018-09-03 DIAGNOSIS — F4489 Other dissociative and conversion disorders: Secondary | ICD-10-CM

## 2018-09-03 DIAGNOSIS — Z794 Long term (current) use of insulin: Secondary | ICD-10-CM | POA: Diagnosis not present

## 2018-09-03 DIAGNOSIS — I130 Hypertensive heart and chronic kidney disease with heart failure and stage 1 through stage 4 chronic kidney disease, or unspecified chronic kidney disease: Secondary | ICD-10-CM | POA: Diagnosis not present

## 2018-09-03 DIAGNOSIS — E039 Hypothyroidism, unspecified: Secondary | ICD-10-CM | POA: Insufficient documentation

## 2018-09-03 DIAGNOSIS — E1122 Type 2 diabetes mellitus with diabetic chronic kidney disease: Secondary | ICD-10-CM | POA: Diagnosis not present

## 2018-09-03 DIAGNOSIS — R410 Disorientation, unspecified: Secondary | ICD-10-CM

## 2018-09-03 LAB — CBC WITH DIFFERENTIAL/PLATELET
Abs Immature Granulocytes: 0.33 K/uL — ABNORMAL HIGH (ref 0.00–0.07)
Basophils Absolute: 0.1 K/uL (ref 0.0–0.1)
Basophils Relative: 1 %
Eosinophils Absolute: 0 K/uL (ref 0.0–0.5)
Eosinophils Relative: 0 %
HCT: 48 % — ABNORMAL HIGH (ref 36.0–46.0)
Hemoglobin: 15 g/dL (ref 12.0–15.0)
Immature Granulocytes: 3 %
Lymphocytes Relative: 12 %
Lymphs Abs: 1.3 K/uL (ref 0.7–4.0)
MCH: 29 pg (ref 26.0–34.0)
MCHC: 31.3 g/dL (ref 30.0–36.0)
MCV: 92.7 fL (ref 80.0–100.0)
Monocytes Absolute: 1.4 K/uL — ABNORMAL HIGH (ref 0.1–1.0)
Monocytes Relative: 13 %
Neutro Abs: 7.8 K/uL — ABNORMAL HIGH (ref 1.7–7.7)
Neutrophils Relative %: 71 %
Platelets: 192 K/uL (ref 150–400)
RBC: 5.18 MIL/uL — ABNORMAL HIGH (ref 3.87–5.11)
RDW: 13.7 % (ref 11.5–15.5)
WBC: 10.8 K/uL — ABNORMAL HIGH (ref 4.0–10.5)
nRBC: 0 % (ref 0.0–0.2)

## 2018-09-03 LAB — COMPREHENSIVE METABOLIC PANEL WITH GFR
ALT: 50 U/L — ABNORMAL HIGH (ref 0–44)
AST: 50 U/L — ABNORMAL HIGH (ref 15–41)
Albumin: 3.6 g/dL (ref 3.5–5.0)
Alkaline Phosphatase: 45 U/L (ref 38–126)
Anion gap: 8 (ref 5–15)
BUN: 39 mg/dL — ABNORMAL HIGH (ref 8–23)
CO2: 31 mmol/L (ref 22–32)
Calcium: 9.3 mg/dL (ref 8.9–10.3)
Chloride: 105 mmol/L (ref 98–111)
Creatinine, Ser: 1.34 mg/dL — ABNORMAL HIGH (ref 0.44–1.00)
GFR calc Af Amer: 41 mL/min — ABNORMAL LOW
GFR calc non Af Amer: 35 mL/min — ABNORMAL LOW
Glucose, Bld: 132 mg/dL — ABNORMAL HIGH (ref 70–99)
Potassium: 4 mmol/L (ref 3.5–5.1)
Sodium: 144 mmol/L (ref 135–145)
Total Bilirubin: 0.8 mg/dL (ref 0.3–1.2)
Total Protein: 6.7 g/dL (ref 6.5–8.1)

## 2018-09-03 LAB — URINALYSIS, ROUTINE W REFLEX MICROSCOPIC
Bilirubin Urine: NEGATIVE
Glucose, UA: NEGATIVE mg/dL
Hgb urine dipstick: NEGATIVE
Ketones, ur: NEGATIVE mg/dL
Leukocytes, UA: NEGATIVE
Nitrite: NEGATIVE
Protein, ur: NEGATIVE mg/dL
Specific Gravity, Urine: 1.016 (ref 1.005–1.030)
pH: 6 (ref 5.0–8.0)

## 2018-09-03 MED ORDER — IPRATROPIUM-ALBUTEROL 0.5-2.5 (3) MG/3ML IN SOLN
3.0000 mL | Freq: Once | RESPIRATORY_TRACT | Status: AC
  Administered 2018-09-03: 3 mL via RESPIRATORY_TRACT
  Filled 2018-09-03: qty 3

## 2018-09-03 NOTE — ED Notes (Signed)
ED Provider at bedside. 

## 2018-09-03 NOTE — Patient Instructions (Addendum)
Send  ER via EMS for further evaluation due to increased lethargy,confusion and nausea and vomiting.

## 2018-09-03 NOTE — Discharge Instructions (Addendum)
Continue taking medications as prescribed. Follow-up with your primary care doctor on Thursday for your scheduled appointment. Return to the emergency room with any new, worsening, concerning symptoms.

## 2018-09-03 NOTE — ED Provider Notes (Signed)
[facility] DEPT Provider Note   CSN: [id number] Arrival date & time: 09/03/18  1224     History   Chief Complaint Chief Complaint  Patient presents with  . Weakness    HPI Melissa Shannon is a 85 y.o. female presenting for evaluation of weakness and confusion.  Patient states she was recently admitted to the hospital, discharged yesterday.  She felt fine yesterday, but this morning, she felt very weak and off.  Daughter states patient was not acting like herself, was very confused and tired.  Patient was seen at her primary care office, where they are concerned about her tiredness and confusion, and recommended she come to the ER for further evaluation.  Patient denies recent fevers, chills, chest pain, abdominal pain, urinary symptoms, normal bowel movements.  She is still coughing, has not taken today's prednisone dose.  She reports improvement of her throat swelling/pain.  She was not started on any pain medications or sedatives while in the hospital, only changes include increase in the Lasix dose, starting prednisone, and discontinuing lisinopril.  Additional history obtained from chart review, visit from most recent hospitalization reviewed.  Patient initially hypoxic, this improved with treatment.  Discharged with prednisone.  Note from PCPs office reviewed.   HPI  Past Medical History:  Diagnosis Date  . Arthritis    bilateral knees  . Chronic diastolic CHF (congestive heart failure) ([place])   . CKD (chronic kidney disease), stage III ([place])   . Diabetes mellitus without complication ([place])   . Edema   . Hyperlipidemia   . Hypertension   . Hypothyroidism   . Pulmonary embolism ([place])   . Urinary incontinence     Patient Active Problem List   Diagnosis Date Noted  . Acute on chronic respiratory failure with hypoxia ([place]) 08/31/2018  . Dyspnea 08/30/2018  . Upper airway cough syndrome 08/30/2018  . Bronchiectasis with (acute) exacerbation  ([place]) 08/30/2018  . CKD (chronic kidney disease), stage III ([place])   . Tachycardia 02/27/2018  . DOE (dyspnea on exertion) 02/27/2018  . Chronic diastolic CHF (congestive heart failure) ([place]) 03/05/2016  . CKD (chronic kidney disease) stage 4, GFR 15-29 ml/min ([facility]) 03/19/2015  . General weakness 11/29/2014  . UTI (urinary tract infection) 11/29/2014  . Diabetes mellitus with renal manifestations, uncontrolled ([place]) 11/29/2014  . Aphasia 11/28/2014  . Pulmonary embolus ([place]) 06/23/2014  . Multiple pulmonary nodules 06/14/2014  . Acute respiratory failure with hypoxia ([place]) 06/14/2014  . Malnutrition of moderate degree ([place]) 06/12/2014  . Hypertension 06/11/2014  . Hyperlipemia 06/11/2014  . CHF (congestive heart failure) ([place]) 06/11/2014    Past Surgical History:  Procedure Laterality Date  . BLADDER SURGERY  2000   Dr.Tannerbaum (Bladder Tact)  . CATARACT EXTRACTION  03/2013   Dr.Shapiro  . CHOLECYSTECTOMY    . EYE SURGERY     bilateral cataracts  . [facility]   . right elbow  1999   x 2, still has one piece of metal in it  . TONSILLECTOMY AND ADENOIDECTOMY  1937   Dr.Brewer     OB History   No obstetric history on file.      Home Medications    Prior to Admission medications   Medication Sig Start Date End Date Taking? Authorizing Provider  alendronate (FOSAMAX) 70 MG tablet TAKE 1 TABLET BY MOUTH ONCE WEEKLY TAKE WITH FULL GLASS OF WATER ON AN EMPTY STOMACH Patient taking differently: Take 70 mg by  mouth once a week.  03/26/18  Yes Lauree Chandler, NP  apixaban (ELIQUIS) 5 MG TABS tablet Take 2.5 mg by mouth 2 (two) times daily. Takes 1/2 tab   Yes [provider]  Calcium Carbonate-Vitamin D (CALTRATE 600+D) 600-400 MG-UNIT tablet Take 1 tablet by mouth 2 (two) times daily.   Yes [provider]  Cholecalciferol (VITAMIN D3) 5000 UNITS CHEW Chew 5,000 Units by mouth daily.    Yes [provider]    donepezil (ARICEPT) 10 MG tablet TAKE ONE TABLET BY MOUTH ONCE DAILY AT BEDTIME TO PRESERVE MEMORY Patient taking differently: Take 10 mg by mouth at bedtime. Take one tablet by mouth once daily at bedtime to preserve memory 05/07/18  Yes Eubanks, Carlos American, NP  feeding supplement, ENSURE COMPLETE, (ENSURE COMPLETE) LIQD Take 237 mLs by mouth 2 (two) times daily between meals as needed (As requested by patient). 06/19/14  Yes Regalado, Belkys A, MD  furosemide (LASIX) 20 MG tablet Take 1 tablet (20 mg total) by mouth daily. 09/02/18  Yes Georgette Shell, MD  hydroxypropyl methylcellulose / hypromellose (ISOPTO TEARS / GONIOVISC) 2.5 % ophthalmic solution Place 1 drop into both eyes 3 (three) times daily as needed for dry eyes.   Yes [provider]  LEVEMIR FLEXTOUCH 100 UNIT/ML Pen INJECT 24 UNITS INTO THE SKIN DAILY. Patient taking differently: Inject 24 Units into the skin at bedtime.  06/11/18  Yes Lauree Chandler, NP  levothyroxine (SYNTHROID, LEVOTHROID) 100 MCG tablet TAKE 1 TABLET BY MOUTH EVERY DAY BEFORE BREAKFAST Patient taking differently: Take 100 mcg by mouth daily before breakfast.  05/31/18  Yes Lauree Chandler, NP  Multiple Vitamin (MULTIVITAMIN WITH MINERALS) TABS tablet Take 1 tablet by mouth daily. 06/19/14  Yes Regalado, Belkys A, MD  naproxen sodium (ALEVE) 220 MG tablet Take 220 mg by mouth daily as needed (sore throat).   Yes [provider]  rosuvastatin (CRESTOR) 20 MG tablet TAKE 1 TABLET EVERY DAY FOR CHOLESTEROL 05/24/18  Yes Eubanks, Carlos American, NP  TRADJENTA 5 MG TABS tablet TAKE 1 TABLET BY MOUTH EVERY DAY 06/04/18  Yes Lauree Chandler, NP  amLODipine (NORVASC) 5 MG tablet Take 1 tablet (5 mg total) by mouth daily. 09/02/18   Georgette Shell, MD  BD PEN NEEDLE NANO U/F 32G X 4 MM MISC USE AS DIRECTED ONCE DAILY 02/26/18   Lauree Chandler, NP  Blood Glucose Monitoring Suppl (ONE TOUCH ULTRA 2) w/Device KIT 1 Device by Does not apply  route daily. Dx: E11.22 02/05/18   Lauree Chandler, NP  ONE TOUCH ULTRA TEST test strip Use every other day to test blood sugar 11/04/15   Gildardo Cranker, DO  [facility] DELICA LANCETS 51O MISC Use every other day to test blood sugar. Dx: E11.22 12/27/16   Lauree Chandler, NP  pantoprazole (PROTONIX) 40 MG tablet Take 1 tablet (40 mg total) by mouth daily. 09/02/18   Georgette Shell, MD  potassium chloride SA (K-DUR,KLOR-CON) 20 MEQ tablet Take 1 tablet (20 mEq total) by mouth daily. 09/02/18   Georgette Shell, MD  predniSONE (DELTASONE) 10 MG tablet Take 30 mg for the first 3 days then 20 mg for the next 3 days and then 10 mg daily till done. 09/02/18   Georgette Shell, MD    Family History Family History  Problem Relation Age of Onset  . Suicidality Father   . Cancer Brother   . Cancer Maternal Grandfather   .  Diabetes Maternal Grandmother   . Melanoma Brother     Social History Social History   Tobacco Use  . Smoking status: Never Smoker  . Smokeless tobacco: Never Used  Substance Use Topics  . Alcohol use: No  . Drug use: No     Allergies   Penicillins   Review of Systems Review of Systems  Respiratory: Positive for cough.   Neurological: Positive for weakness.       Confusion  All other systems reviewed and are negative.    Physical Exam Updated Vital Signs BP (!) 143/88 (BP Location: Right Arm)   Pulse 66   Temp (!) 97.5 F (36.4 C) (Oral)   Resp 18   Ht '5\' 8"'$  (1.727 m)   SpO2 98%   BMI 33.96 kg/m   Physical Exam Vitals signs and nursing note reviewed.  Constitutional:      General: She is not in acute distress.    Appearance: She is well-developed.     Comments: Resting comfortably in the bed in no acute distress.  HENT:     Head: Normocephalic and atraumatic.  Eyes:     Extraocular Movements: Extraocular movements intact.     Conjunctiva/sclera: Conjunctivae normal.     Pupils: Pupils are equal, round, and reactive to light.    Neck:     Musculoskeletal: Normal range of motion and neck supple.  Cardiovascular:     Rate and Rhythm: Normal rate and regular rhythm.     Pulses: Normal pulses.  Pulmonary:     Effort: Pulmonary effort is normal. No respiratory distress.     Breath sounds: Wheezing present.     Comments: Expiratory wheezing in all fields, no signs of respiratory distress.  Speaking in full sentences. Abdominal:     General: There is no distension.     Palpations: Abdomen is soft.     Tenderness: There is no abdominal tenderness.  Musculoskeletal: Normal range of motion.     Comments: Strength intact x4.  Sensation intact x4.  Radial pedal pulses intact bilaterally.  Bilateral leg swelling, patient states this is baseline.  Skin:    General: Skin is warm and dry.     Capillary Refill: Capillary refill takes less than 2 seconds.  Neurological:     General: No focal deficit present.     Mental Status: She is alert and oriented to person, place, and time.     Cranial Nerves: Cranial nerves are intact.     Sensory: Sensation is intact.     Motor: Motor function is intact.     Comments: Alert and oriented.  No obvious neurologic deficit.  CN intact.      ED Treatments / Results  Labs (all labs ordered are listed, but only abnormal results are displayed) Labs Reviewed  CBC WITH DIFFERENTIAL/PLATELET - Abnormal; Notable for the following components:      Result Value   WBC 10.8 (*)    RBC 5.18 (*)    HCT 48.0 (*)    Neutro Abs 7.8 (*)    Monocytes Absolute 1.4 (*)    Abs Immature Granulocytes 0.33 (*)    All other components within normal limits  COMPREHENSIVE METABOLIC PANEL - Abnormal; Notable for the following components:   Glucose, Bld 132 (*)    BUN 39 (*)    Creatinine, Ser 1.34 (*)    AST 50 (*)    ALT 50 (*)    GFR calc non Af Amer 35 (*)  GFR calc Af Amer 41 (*)    All other components within normal limits  URINE CULTURE  URINALYSIS, ROUTINE W REFLEX MICROSCOPIC     EKG None  Radiology Dg Chest 2 View  Result Date: 09/03/2018 CLINICAL DATA:  85 year old female with weakness nausea and shortness of breath EXAM: CHEST - 2 VIEW COMPARISON:  08/30/2018, CT 08/30/2018 FINDINGS: Cardiomediastinal silhouette unchanged in size and contour. No evidence of central vascular congestion. No interlobular septal thickening. No pneumothorax. No pleural effusion. Similar appearance of interstitial opacities bilateral lungs with no confluent airspace disease. Degenerative changes of the spine. No displaced fracture. IMPRESSION: Chronic lung changes without evidence of acute cardiopulmonary disease Electronically Signed   By: Corrie Mckusick D.O.   On: 09/03/2018 14:11   Ct Head Wo Contrast  Result Date: 09/03/2018 CLINICAL DATA:  85 year old female with altered level of consciousness and weakness. Recently discharged from the hospital yesterday. EXAM: CT HEAD WITHOUT CONTRAST TECHNIQUE: Contiguous axial images were obtained from the base of the skull through the vertex without intravenous contrast. COMPARISON:  Prior CT scan of the head 11/28/2014 FINDINGS: Brain: No evidence of acute infarction, hemorrhage, hydrocephalus, extra-axial collection or mass lesion/mass effect. Vascular: No hyperdense vessel or unexpected calcification. Skull: Normal. Negative for fracture or focal lesion. Sinuses/Orbits: High attenuation material noted dependently within the left maxillary sinus. Other: None. IMPRESSION: 1. No acute intracranial abnormality. 2. Incompletely imaged high density material layering in the left maxillary sinus. This may represent inspissated secretions in the setting of chronic sinusitis. Electronically Signed   By: Jacqulynn Cadet M.D.   On: 09/03/2018 14:40    Procedures Procedures (including critical care time)  Medications Ordered in ED Medications  ipratropium-albuterol (DUONEB) 0.5-2.5 (3) MG/3ML nebulizer solution 3 mL (3 mLs Nebulization Given 09/03/18  1338)     Initial Impression / Assessment and Plan / ED Course  I have reviewed the triage vital signs and the nursing notes.  Pertinent labs & imaging results that were available during my care of the patient were reviewed by me and considered in my medical decision making (see chart for details).     Patient presenting for evaluation of weakness and confusion.  Physical exam reassuring, she is alert and oriented.  Appears nontoxic.  No focal neuro deficits or weakness noted.  Swelling and erythema of the throat from previous visit appears resolved, however wheezing has persisted.  However, patient is not hypoxic, is able to speak in full sentences.  Will give breathing treatment while obtaining work-up.  Will start work-up to rule out infections.  Consider pulmonary and urinary causes.  CT head due to patient's family's description of altered mental status this morning, although today's exam is reassuring.  CT head negative for acute findings.  Chest x-ray viewed interpreted by me, no pneumonia, no source, effusion.  On reassessment, lung sounds improved with breathing treatment.  Labs with mild leukocytosis at 10, but otherwise reassuring.  Case discussed with attending, Dr. Zenia Resides evaluated the patient.  Urine pending.  Discussed with PCP who saw patient earlier today.  Sounds like patient was more confused this morning, but has improved without intervention.  PCP was concerned for infection.  Urine negative for infection, will send for culture.  Discussed findings with patient and family.  Patient and family state patient is back to normal, no further confusion or weakness.  They are agreeable to discharge.  Patient has follow-up with PCP on Thursday.  At this time, patient appears safe for discharge.  Return  precautions given.  Patient and family state they understand and agree to plan.   Final Clinical Impressions(s) / ED Diagnoses   Final diagnoses:  Weakness  Confusion    ED  Discharge Orders    None       Franchot Heidelberg, PA-C 09/03/18 1825    Lacretia Leigh, MD 09/04/18 [phone]

## 2018-09-03 NOTE — ED Notes (Signed)
Ronalee Belts RN  to attempt US guided IV.

## 2018-09-03 NOTE — ED Notes (Signed)
Patient transported to X-ray 

## 2018-09-03 NOTE — ED Triage Notes (Signed)
Pt recently discharged post diagnosis of respiratory failure; complaint of continued weakness post discharge; denies v/d but verbalizes nausea. Denies pain.

## 2018-09-03 NOTE — Progress Notes (Signed)
Provider: Dinah Ngetich FNP-C  Lauree Chandler, NP  Patient Care Team: Lauree Chandler, NP as PCP - General (Nurse Practitioner) Larey Dresser, MD as PCP - Advanced Heart Failure (Cardiology) Tanda Rockers, MD as Consulting Physician (Pulmonary Disease) Larey Dresser, MD as Consulting Physician (Cardiology)  Extended Emergency Contact Information Primary Emergency Contact: [place] of [place] Phone: [phone] Mobile Phone: [phone] Relation: Daughter Secondary Emergency Contact: [facility] Address: [place], [place] [address] [name] of [place] Phone: [phone] Mobile Phone: [phone] Relation: Son  Goals of care: Advanced Directive information Advanced Directives 09/03/2018  Does Patient Have a Medical Advance Directive? Yes  Type of Advance Directive Out of facility DNR (pink MOST or yellow form)  Does patient want to make changes to medical advance directive? No - Patient declined  Copy of [place] in Chart? -  Would patient like information on creating a medical advance directive? -  Pre-existing out of facility DNR order (yellow form or pink MOST form) -     Chief Complaint  Patient presents with  . Hospitalization Follow-up    Follow up from hospital from 08/30/2018- 09/02/2018 for respiratory failure   . other    Aide states patient is very confused and groggy this morning. Patient also vomitted in trash can.     HPI:  Pt is a 85 y.o. female seen today for an acute visit for follow up hospitalization from 08/30/2018-09/02/2018 for respiratory failure.Her oxygen saturation was low oxygen supplementation was given by EMS.of note she was diagnosed with pharyngitis/largyngitis 08/29/2018 treated conservatively.she is here escorted by her daughter and care giver.Patient's daughter states patient confused today and very weak.Patient's care giver states patient had a toast and  orange juice this morning.she did not check her blood sugar today.CBG done 193 during visit.Amlodinpine,K-dur,protonix and prednisone ordered on discharge from hospital but has not take any of the new medication.Lisinopril discontinued in the hospital thought patient had allergic reaction.Cough has not improved.  She denies any fever or chills.   Past Medical History:  Diagnosis Date  . Arthritis    bilateral knees  . Chronic diastolic CHF (congestive heart failure) ([employer])   . CKD (chronic kidney disease), stage III ([facility])   . Diabetes mellitus without complication ([place])   . Edema   . Hyperlipidemia   . Hypertension   . Hypothyroidism   . Pulmonary embolism ([place])   . Urinary incontinence    Past Surgical History:  Procedure Laterality Date  . BLADDER SURGERY  2000   Dr.Tannerbaum (Bladder Tact)  . CATARACT EXTRACTION  03/2013   Dr.Shapiro  . CHOLECYSTECTOMY    . EYE SURGERY     bilateral cataracts  . [facility]   . right elbow  1999   x 2, still has one piece of metal in it  . TONSILLECTOMY AND ADENOIDECTOMY  1937   Dr.Brewer    Allergies  Allergen Reactions  . Penicillins Other (See Comments)    DID THE REACTION INVOLVE: Swelling of the face/tongue/throat, SOB, or low BP? Yes Sudden or severe rash/hives, skin peeling, or the inside of the mouth or nose? No Did it require medical treatment? No When did it last happen? If all above answers are "NO", may proceed with cephalosporin use.   Throat felt tight    Outpatient Encounter Medications as of 09/03/2018  Medication Sig  .  alendronate (FOSAMAX) 70 MG tablet TAKE 1 TABLET BY MOUTH ONCE WEEKLY TAKE WITH FULL GLASS OF WATER ON AN EMPTY STOMACH  . amLODipine (NORVASC) 5 MG tablet Take 1 tablet (5 mg total) by mouth daily.  Marland Kitchen apixaban (ELIQUIS) 5 MG TABS tablet Take 2.5 mg by mouth 2 (two) times daily. Takes 1/2 tab  . BD PEN NEEDLE NANO U/F 32G X 4 MM MISC USE AS DIRECTED ONCE  DAILY  . Blood Glucose Monitoring Suppl (ONE TOUCH ULTRA 2) w/Device KIT 1 Device by Does not apply route daily. Dx: E11.22  . Calcium Carbonate-Vitamin D (CALTRATE 600+D) 600-400 MG-UNIT tablet Take 1 tablet by mouth 2 (two) times daily.  . Cholecalciferol (VITAMIN D3) 5000 UNITS CHEW Chew 5,000 Units by mouth daily.   Marland Kitchen donepezil (ARICEPT) 10 MG tablet TAKE ONE TABLET BY MOUTH ONCE DAILY AT BEDTIME TO PRESERVE MEMORY  . feeding supplement, ENSURE COMPLETE, (ENSURE COMPLETE) LIQD Take 237 mLs by mouth 2 (two) times daily between meals as needed (As requested by patient).  . furosemide (LASIX) 20 MG tablet Take 1 tablet (20 mg total) by mouth daily.  . hydroxypropyl methylcellulose / hypromellose (ISOPTO TEARS / GONIOVISC) 2.5 % ophthalmic solution Place 1 drop into both eyes 3 (three) times daily as needed for dry eyes.  Marland Kitchen LEVEMIR FLEXTOUCH 100 UNIT/ML Pen INJECT 24 UNITS INTO THE SKIN DAILY.  Marland Kitchen levothyroxine (SYNTHROID, LEVOTHROID) 100 MCG tablet TAKE 1 TABLET BY MOUTH EVERY DAY BEFORE BREAKFAST  . Multiple Vitamin (MULTIVITAMIN WITH MINERALS) TABS tablet Take 1 tablet by mouth daily.  . ONE TOUCH ULTRA TEST test strip Use every other day to test blood sugar  . ONETOUCH DELICA LANCETS 46N MISC Use every other day to test blood sugar. Dx: E11.22  . pantoprazole (PROTONIX) 40 MG tablet Take 1 tablet (40 mg total) by mouth daily.  . potassium chloride SA (K-DUR,KLOR-CON) 20 MEQ tablet Take 1 tablet (20 mEq total) by mouth daily.  . predniSONE (DELTASONE) 10 MG tablet Take 30 mg for the first 3 days then 20 mg for the next 3 days and then 10 mg daily till done.  . rosuvastatin (CRESTOR) 20 MG tablet TAKE 1 TABLET EVERY DAY FOR CHOLESTEROL  . TRADJENTA 5 MG TABS tablet TAKE 1 TABLET BY MOUTH EVERY DAY  . [DISCONTINUED] ELIQUIS 5 MG TABS tablet TAKE 1 TABLET BY MOUTH TWICE A DAY (Patient taking differently: Take 2.5 mg by mouth 2 (two) times daily. )   No facility-administered encounter medications  on file as of 09/03/2018.     Review of Systems  Constitutional: Positive for activity change, appetite change and fatigue. Negative for chills, fever and unexpected weight change.  HENT: Negative for congestion, rhinorrhea, sinus pressure, sinus pain, sneezing, sore throat and trouble swallowing.   Eyes: Negative for discharge, redness and itching.  Respiratory: Positive for cough. Negative for chest tightness, shortness of breath and wheezing.   Cardiovascular: Positive for leg swelling. Negative for chest pain and palpitations.  Gastrointestinal: Positive for nausea and vomiting. Negative for abdominal distention, abdominal pain, constipation and diarrhea.  Endocrine: Negative for cold intolerance, heat intolerance, polydipsia, polyphagia and polyuria.  Genitourinary: Negative for dysuria, flank pain, frequency and urgency.  Musculoskeletal: Positive for gait problem. Negative for arthralgias and myalgias.  Skin: Negative for color change, pallor, rash and wound.  Neurological: Negative for dizziness, light-headedness and headaches.       Generalized weakness   Hematological: Does not bruise/bleed easily.  Psychiatric/Behavioral: Positive for confusion. Negative for  agitation and sleep disturbance. The patient is not nervous/anxious.     Immunization History  Administered Date(s) Administered  . Pneumococcal Polysaccharide-23 04/06/2009  . Td 04/06/2009   Pertinent  Health Maintenance Due  Topic Date Due  . INFLUENZA VACCINE  05/29/2019 (Originally 03/15/2018)  . PNA vac Low Risk Adult (2 of 2 - PCV13) 05/29/2019 (Originally 04/06/2010)  . URINE MICROALBUMIN  09/28/2018  . HEMOGLOBIN A1C  11/27/2018  . FOOT EXAM  02/06/2019  . OPHTHALMOLOGY EXAM  07/03/2019  . DEXA SCAN  Completed   Fall Risk  05/28/2018 02/23/2018 02/23/2018 02/05/2018 08/21/2017  Falls in the past year? No No No No No    Vitals:   09/03/18 1015  BP: 130/80  Pulse: 66  Resp: 18  SpO2: 98%  Height: '5\' 8"'$  (1.727  m)   Body mass index is 33.96 kg/m. Physical Exam Constitutional:      Appearance: She is obese. She is ill-appearing. She is not diaphoretic.  HENT:     Head: Normocephalic.     Right Ear: External ear normal. There is impacted cerumen.     Left Ear: External ear normal. There is impacted cerumen.     Nose: Nose normal. No congestion or rhinorrhea.     Mouth/Throat:     Mouth: Mucous membranes are dry.     Pharynx: Oropharynx is clear. No oropharyngeal exudate or posterior oropharyngeal erythema.  Eyes:     General: No scleral icterus.       Right eye: No discharge.        Left eye: No discharge.     Extraocular Movements: Extraocular movements intact.     Conjunctiva/sclera: Conjunctivae normal.     Pupils: Pupils are equal, round, and reactive to light.     Comments: Corrective lens in place   Neck:     Musculoskeletal: Normal range of motion. No neck rigidity or muscular tenderness.  Cardiovascular:     Rate and Rhythm: Normal rate and regular rhythm.     Pulses: Normal pulses.     Heart sounds: Normal heart sounds. No murmur. No friction rub. No gallop.   Pulmonary:     Effort: Pulmonary effort is normal. No respiratory distress.     Breath sounds: No wheezing or rhonchi.     Comments: Right lower lobe rales cleared with coughing  Chest:     Chest wall: No tenderness.  Abdominal:     General: Bowel sounds are normal. There is no distension.     Palpations: Abdomen is soft. There is no mass.     Tenderness: There is no abdominal tenderness. There is no right CVA tenderness, left CVA tenderness, guarding or rebound.  Musculoskeletal:        General: No tenderness.     Comments: Unsteady gait walked to visit with Front wheel walker with assistance.Generalized weakness noted.bilateral lower chronic non-pitting edema.   Lymphadenopathy:     Cervical: No cervical adenopathy.  Skin:    General: Skin is warm and dry.     Coloration: Skin is not pale.     Findings: No  erythema or rash.  Neurological:     Mental Status: She is lethargic and confused.     Sensory: Sensation is intact.     Gait: Gait abnormal.  Psychiatric:        Mood and Affect: Mood normal.        Speech: Speech normal.        Behavior: Behavior normal.  Thought Content: Thought content normal.        Judgment: Judgment normal.    Labs reviewed: Recent Labs    05/11/18 1001 08/30/18 0224 08/31/18 0508  NA 140 139 137  K 4.9 3.9 4.0  CL 105 103 101  CO2 '28 28 27  '$ GLUCOSE 142* 122* 251*  BUN 28* 26* 34*  CREATININE 1.25* 1.23* 1.47*  CALCIUM 10.4* 9.2 9.1   Recent Labs    09/28/17 0917 02/05/18 1049 08/30/18 0224  AST '12 18 28  '$ ALT '9 14 20  '$ ALKPHOS  --   --  40  BILITOT 0.6 0.5 0.7  PROT 6.3 6.8 6.3*  ALBUMIN  --   --  3.5   Recent Labs    02/27/18 1528 08/30/18 0224 08/31/18 0508  WBC 7.9 8.1 9.8  NEUTROABS 4.9 6.4  --   HGB 14.2 13.5 12.4  HCT 42.7 43.5 40.1  MCV 88.6 93.1 92.8  PLT 171.0 131* 141*   Lab Results  Component Value Date   TSH 0.63 02/23/2018   Lab Results  Component Value Date   HGBA1C 6.8 (H) 05/28/2018   Lab Results  Component Value Date   CHOL 166 02/23/2018   HDL 64 02/23/2018   LDLCALC 77 02/23/2018   TRIG 151 (H) 02/23/2018   CHOLHDL 2.6 02/23/2018    Significant Diagnostic Results in last 30 days:  Dg Chest 2 View  Result Date: 08/30/2018 CLINICAL DATA:  Cough, chest congestion EXAM: CHEST - 2 VIEW COMPARISON:  01/07/2015 FINDINGS: Lungs are clear.  No pleural effusion or pneumothorax. The heart is normal in size. Degenerative changes of the visualized thoracolumbar spine. IMPRESSION: Normal chest radiographs. Electronically Signed   By: Julian Hy M.D.   On: 08/30/2018 02:33   Ct Angio Chest Pe W And/or Wo Contrast  Result Date: 08/30/2018 CLINICAL DATA:  Shortness of breath. EXAM: CT ANGIOGRAPHY CHEST WITH CONTRAST TECHNIQUE: Multidetector CT imaging of the chest was performed using the standard  protocol during bolus administration of intravenous contrast. Multiplanar CT image reconstructions and MIPs were obtained to evaluate the vascular anatomy. CONTRAST:  56m ISOVUE-370 IOPAMIDOL (ISOVUE-370) INJECTION 76% COMPARISON:  Radiography same day. CT 09/11/2014. FINDINGS: Cardiovascular: Pulmonary arterial opacification is good. There are no pulmonary emboli. The heart is at the upper limits of normal in size. There is some coronary artery calcification. There is aortic atherosclerosis without aneurysm or dissection Mediastinum/Nodes: No mediastinal mass or lymphadenopathy. Lungs/Pleura: No pleural effusion. There is pronounced tracheal narrowing which could be due to tracheomalacia and or tracheobronchitis. Major bronchi are also narrowed with apparent wall thickening. Particular narrowing of the lower lobe bronchi right more than left. Atelectasis at the posterior inferior right lower lobe. Chronic bronchiectasis and or bronchial atresia in the right middle lobe as seen previously. Chronic small benign nodules in the lingula as seen previously. No upper lobe parenchymal finding other than a 3 mm nodule in the right upper lobe image 23 measuring 2.5 mm, likely to be benign. No underlying emphysema. Upper Abdomen: Chronic cyst in the left lobe of the liver. No significant upper abdominal finding. Patient does have a small hiatal hernia. Musculoskeletal: Ordinary spondylosis. Review of the MIP images confirms the above findings. IMPRESSION: 1. No pulmonary emboli. 2. Pronounced tracheal narrowing which could be due to tracheomalacia and/or tracheobronchitis. Narrowing and wall thickening also of the main bronchial branches, particularly in the right lower lobe. Partial atelectasis of the posterior inferior right lower lobe. 3. Chronic bronchiectasis and or bronchial  atresia in the right middle lobe. 4. Coronary artery calcification. 5. Small hiatal hernia. Aortic Atherosclerosis (ICD10-I70.0). Electronically  Signed   By: Nelson Chimes M.D.   On: 08/30/2018 06:35   Assessment/Plan  1. Lethargic Afebrile.CBG 193 generalized weakness falls asleep with head down during visit.right lung lower lobe with rales but clears with coughing.No signs of fluid overload.Send to ER for further evaluation.discussed recommendation with and patient and daughter both in agreement. Will send to ER via EMS due to lethargy.    2. Confusion state Afebrile.New onset for patient per POA.Will send to ER for further evaluation.  3. Nausea vomiting  Had x 1 episode of vomiting yellow emesis during visit.Negative abdominal exam.Send to ER for further evaluation.   Family/ staff Communication: Reviewed plan of care with patient and POA   Labs/tests ordered: None. Patient send to ER via EMS at 1200 Pm Vital stable.   Sandrea Hughs, NP

## 2018-09-03 NOTE — ED Notes (Signed)
Bed: DZ32 Expected date:  Expected time:  Means of arrival:  Comments: EMS 85 yo F weakness

## 2018-09-03 NOTE — ED Provider Notes (Signed)
Medical screening examination/treatment/procedure(s) were conducted as a shared visit with non-physician practitioner(s) and myself.  I personally evaluated the patient during the encounter.  None 85 year old female who was discharged from the hospital yesterday after admission for respiratory failure patient continues to complain of increasing weakness.  Her exam is nonfocal here.  Work-up is pending   Lacretia Leigh, MD 09/03/18 1504

## 2018-09-05 LAB — URINE CULTURE

## 2018-09-06 ENCOUNTER — Encounter: Payer: Self-pay | Admitting: Nurse Practitioner

## 2018-09-06 ENCOUNTER — Ambulatory Visit (INDEPENDENT_AMBULATORY_CARE_PROVIDER_SITE_OTHER): Payer: Medicare Other | Admitting: Nurse Practitioner

## 2018-09-06 VITALS — BP 130/78 | HR 88 | Temp 98.3°F | Resp 10 | Ht 68.0 in | Wt 214.0 lb

## 2018-09-06 DIAGNOSIS — J40 Bronchitis, not specified as acute or chronic: Secondary | ICD-10-CM | POA: Diagnosis not present

## 2018-09-06 DIAGNOSIS — E1169 Type 2 diabetes mellitus with other specified complication: Secondary | ICD-10-CM | POA: Diagnosis not present

## 2018-09-06 DIAGNOSIS — Z86711 Personal history of pulmonary embolism: Secondary | ICD-10-CM

## 2018-09-06 DIAGNOSIS — I509 Heart failure, unspecified: Secondary | ICD-10-CM

## 2018-09-06 DIAGNOSIS — E1122 Type 2 diabetes mellitus with diabetic chronic kidney disease: Secondary | ICD-10-CM | POA: Diagnosis not present

## 2018-09-06 DIAGNOSIS — N184 Chronic kidney disease, stage 4 (severe): Secondary | ICD-10-CM

## 2018-09-06 DIAGNOSIS — R5383 Other fatigue: Secondary | ICD-10-CM

## 2018-09-06 DIAGNOSIS — Z794 Long term (current) use of insulin: Secondary | ICD-10-CM

## 2018-09-06 DIAGNOSIS — N183 Chronic kidney disease, stage 3 (moderate): Secondary | ICD-10-CM

## 2018-09-06 DIAGNOSIS — E785 Hyperlipidemia, unspecified: Secondary | ICD-10-CM

## 2018-09-06 DIAGNOSIS — E034 Atrophy of thyroid (acquired): Secondary | ICD-10-CM

## 2018-09-06 NOTE — Progress Notes (Signed)
Careteam: Patient Care Team: Lauree Chandler, NP as PCP - General (Nurse Practitioner) Larey Dresser, MD as PCP - Advanced Heart Failure (Cardiology) Tanda Rockers, MD as Consulting Physician (Pulmonary Disease) Larey Dresser, MD as Consulting Physician (Cardiology)  Advanced Directive information    Allergies  Allergen Reactions  . Penicillins Other (See Comments)    DID THE REACTION INVOLVE: Swelling of the face/tongue/throat, SOB, or low BP? Yes Sudden or severe rash/hives, skin peeling, or the inside of the mouth or nose? No Did it require medical treatment? No When did it last happen?more than 10 years  If all above answers are "NO", may proceed with cephalosporin use.   Throat felt tight    Chief Complaint  Patient presents with  . Hospitalization Follow-up    ER follow-up   . Immunizations    Refused all vaccine updated   . Medication Management    Discuss lasix      HPI: Patient is a 85 y.o. female seen in the office today for hospital follow up. Pt with past medical history of congestive heart failure, diabetes mellitus, hypertension, hyperlipidemia, hypothyroidism, history of pulmonary embolism who went to the the hospital with worsening cough and shortness of breath. Noted to be hypoxic. Thought to have tracheobronchitis and prednisone was added Stopped lisinipril and symptoms improved. She was started on norvasc 5 mg daily which she has tolerated well.  She was also placed on a prednisone taper which she is currently taking.  She was told to start protonix - she has no GERD/heartburn hx of current symptoms.   She was seen in office on 1/20 and was lethargic and "out of it" she was sent to ED for further evaluation. CT head and chest xray were negative. Her mentation improved in ED without intervention and she was discharged home. She has not had any more episodes since and has been at baseline, feeling "much better"  Appetite is improving.    CHF- lasix was increased to 20 mg by mouth daily, no increase in edema, shortness of breath.   CKD- has been at baseline, lab on 1/20  HTN- stable on norvasc and lasix   Review of Systems:  Review of Systems  Constitutional: Negative for chills and fever.  Respiratory: Negative for cough and sputum production.   Cardiovascular: Negative for chest pain and palpitations.  Gastrointestinal: Negative for abdominal pain, constipation, diarrhea, heartburn, nausea and vomiting.  Genitourinary: Negative for dysuria and urgency.  Musculoskeletal: Negative for back pain, falls, joint pain, myalgias and neck pain.  Skin: Negative for itching and rash.  Neurological: Negative for dizziness, weakness and headaches.  Psychiatric/Behavioral: Negative for depression.    Past Medical History:  Diagnosis Date  . Arthritis    bilateral knees  . Chronic diastolic CHF (congestive heart failure) ([place])   . CKD (chronic kidney disease), stage III ([place])   . Diabetes mellitus without complication ([place])   . Edema   . Hyperlipidemia   . Hypertension   . Hypothyroidism   . Pulmonary embolism ([place])   . Urinary incontinence    Past Surgical History:  Procedure Laterality Date  . BLADDER SURGERY  2000   Dr.Tannerbaum (Bladder Tact)  . CATARACT EXTRACTION  03/2013   Dr.Shapiro  . CHOLECYSTECTOMY    . EYE SURGERY     bilateral cataracts  . [facility]   . right elbow  1999   x 2, still has one  piece of metal in it  . TONSILLECTOMY AND ADENOIDECTOMY  1937   Dr.Brewer   Social History:   reports that she has never smoked. She has never used smokeless tobacco. She reports that she does not drink alcohol or use drugs.  Family History  Problem Relation Age of Onset  . Suicidality Father   . Cancer Brother   . Cancer Maternal Grandfather   . Diabetes Maternal Grandmother   . Melanoma Brother     Medications: Patient's Medications  New Prescriptions   No  medications on file  Previous Medications   ALENDRONATE (FOSAMAX) 70 MG TABLET    TAKE 1 TABLET BY MOUTH ONCE WEEKLY TAKE WITH FULL GLASS OF WATER ON AN EMPTY STOMACH   AMLODIPINE (NORVASC) 5 MG TABLET    Take 1 tablet (5 mg total) by mouth daily.   APIXABAN (ELIQUIS) 5 MG TABS TABLET    Take 2.5 mg by mouth 2 (two) times daily. Takes 1/2 tab   BD PEN NEEDLE NANO U/F 32G X 4 MM MISC    USE AS DIRECTED ONCE DAILY   BLOOD GLUCOSE MONITORING SUPPL (ONE TOUCH ULTRA 2) W/DEVICE KIT    1 Device by Does not apply route daily. Dx: E11.22   CALCIUM CARBONATE-VITAMIN D (CALTRATE 600+D) 600-400 MG-UNIT TABLET    Take 1 tablet by mouth 2 (two) times daily.   CHOLECALCIFEROL (VITAMIN D3) 5000 UNITS CHEW    Chew 5,000 Units by mouth daily.    DONEPEZIL (ARICEPT) 10 MG TABLET    TAKE ONE TABLET BY MOUTH ONCE DAILY AT BEDTIME TO PRESERVE MEMORY   FEEDING SUPPLEMENT, ENSURE COMPLETE, (ENSURE COMPLETE) LIQD    Take 237 mLs by mouth 2 (two) times daily between meals as needed (As requested by patient).   FUROSEMIDE (LASIX) 20 MG TABLET    Take 1 tablet (20 mg total) by mouth daily.   HYDROXYPROPYL METHYLCELLULOSE / HYPROMELLOSE (ISOPTO TEARS / GONIOVISC) 2.5 % OPHTHALMIC SOLUTION    Place 1 drop into both eyes 3 (three) times daily as needed for dry eyes.   LEVEMIR FLEXTOUCH 100 UNIT/ML PEN    INJECT 24 UNITS INTO THE SKIN DAILY.   LEVOTHYROXINE (SYNTHROID, LEVOTHROID) 100 MCG TABLET    TAKE 1 TABLET BY MOUTH EVERY DAY BEFORE BREAKFAST   MULTIPLE VITAMIN (MULTIVITAMIN WITH MINERALS) TABS TABLET    Take 1 tablet by mouth daily.   NAPROXEN SODIUM (ALEVE) 220 MG TABLET    Take 220 mg by mouth daily as needed (sore throat).   ONE TOUCH ULTRA TEST TEST STRIP    Use every other day to test blood sugar   ONETOUCH DELICA LANCETS 55V MISC    Use every other day to test blood sugar. Dx: E11.22   PANTOPRAZOLE (PROTONIX) 40 MG TABLET    Take 1 tablet (40 mg total) by mouth daily.   POTASSIUM CHLORIDE SA (K-DUR,KLOR-CON) 20 MEQ  TABLET    Take 1 tablet (20 mEq total) by mouth daily.   PREDNISONE (DELTASONE) 10 MG TABLET    Take 30 mg for the first 3 days then 20 mg for the next 3 days and then 10 mg daily till done.   ROSUVASTATIN (CRESTOR) 20 MG TABLET    TAKE 1 TABLET EVERY DAY FOR CHOLESTEROL   TRADJENTA 5 MG TABS TABLET    TAKE 1 TABLET BY MOUTH EVERY DAY  Modified Medications   No medications on file  Discontinued Medications   No medications on file  Physical Exam:  Vitals:   09/06/18 1039  BP: 130/78  Pulse: 88  Resp: 10  Temp: 98.3 F (36.8 C)  TempSrc: Oral  SpO2: 93%  Weight: 214 lb (97.1 kg)  Height: 5' 8" (1.727 m)   Body mass index is 32.54 kg/m.  Physical Exam Constitutional:      General: She is not in acute distress.    Appearance: She is well-developed. She is not diaphoretic.  HENT:     Head: Normocephalic and atraumatic.  Eyes:     Conjunctiva/sclera: Conjunctivae normal.     Pupils: Pupils are equal, round, and reactive to light.  Neck:     Musculoskeletal: Normal range of motion and neck supple.  Cardiovascular:     Rate and Rhythm: Normal rate and regular rhythm.     Heart sounds: Murmur present.  Pulmonary:     Effort: Pulmonary effort is normal.     Breath sounds: Normal breath sounds.  Abdominal:     General: Bowel sounds are normal.     Palpations: Abdomen is soft.  Musculoskeletal:        General: No tenderness.     Right lower leg: No edema.     Left lower leg: No edema.  Skin:    General: Skin is warm and dry.  Neurological:     Mental Status: She is alert and oriented to person, place, and time.     Gait: Gait abnormal (uses walker).     Labs reviewed: Basic Metabolic Panel: Recent Labs    02/23/18 1120  08/30/18 0224 08/31/18 0508 09/03/18 1355  NA  --    < > 139 137 144  K  --    < > 3.9 4.0 4.0  CL  --    < > 103 101 105  CO2  --    < > _0 GLUCOSE  --    < > 122* 251* 132*  BUN  --    < > 26* 34* 39*  CREATININE  --    < >  1.23* 1.47* 1.34*  CALCIUM  --    < > 9.2 9.1 9.3  TSH 0.63  --   --   --   --    < > = values in this interval not displayed.   Liver Function Tests: Recent Labs    02/05/18 1049 08/30/18 0224 09/03/18 1355  AST 18 28 50*  ALT 14 20 50*  ALKPHOS  --  40 45  BILITOT 0.5 0.7 0.8  PROT 6.8 6.3* 6.7  ALBUMIN  --  3.5 3.6   No results for input(s): LIPASE, AMYLASE in the last 8760 hours. No results for input(s): AMMONIA in the last 8760 hours. CBC: Recent Labs    02/27/18 1528 08/30/18 0224 08/31/18 0508 09/03/18 1355  WBC 7.9 8.1 9.8 10.8*  NEUTROABS 4.9 6.4  --  7.8*  HGB 14.2 13.5 12.4 15.0  HCT 42.7 43.5 40.1 48.0*  MCV 88.6 93.1 92.8 92.7  PLT 171.0 131* 141* 192   Lipid Panel: Recent Labs    02/23/18 1120  CHOL 166  HDL 64  LDLCALC 77  TRIG 151*  CHOLHDL 2.6   TSH: Recent Labs    02/23/18 1120  TSH 0.63   A1C: Lab Results  Component Value Date   HGBA1C 6.8 (H) 05/28/2018     Assessment/Plan 1. Lethargic Has resolved.  2. Type 2 diabetes mellitus with stage 3 chronic kidney disease, with long-term current  use of insulin (HCC) -A1c6.8 on last lab, continue current regimen with dietary modifications.  - Hemoglobin A1c; Future  3. Hyperlipidemia associated with type 2 diabetes mellitus ([place]) -continues on crestor 20 mg daily - COMPLETE METABOLIC PANEL WITH GFR; Future - Lipid Panel; Future  4. Hypothyroidism due to acquired atrophy of thyroid Continues on synthroid 100 mcg daily  - TSH; Future  5. CKD (chronic kidney disease) stage 4, GFR 15-29 ml/min (HCC) - COMPLETE METABOLIC PANEL WITH GFR; Future  6. History of pulmonary embolus (PE) - no signs of recurrence. Continues on eliquis twice daily  - CBC with Differential/Platelets; Future  7. Chronic congestive heart failure, unspecified heart failure type ([place]) -stable, continues on lasix 20 mg daily with potassium for supplement, will have her follow up lab in 2 week to check renal  function and potassium level - BASIC METABOLIC PANEL WITH GFR; Future - COMPLETE METABOLIC PANEL WITH GFR; Future  8. Tracheobronchitis Doing well. Continues on prednisone taper. To stop protonix once prednisone complete which was started during hospitalization.   Next appt: 3 months with labs prior to appt.  Carlos American. [place], [facility] [phone]

## 2018-09-06 NOTE — Patient Instructions (Addendum)
In 2 weeks make an appt for LAB ONLY  Follow up in 3 months routine follow up with labs prior to visit.   Encourage proper hydration and to avoid NSAIDS (Aleve, Advil, Motrin, Ibuprofen)   Stop protonix when you complete prednisone

## 2018-09-12 ENCOUNTER — Other Ambulatory Visit: Payer: Self-pay | Admitting: *Deleted

## 2018-09-12 MED ORDER — ONETOUCH ULTRA BLUE VI STRP: ORAL_STRIP | 1 refills | Status: DC

## 2018-09-12 NOTE — Telephone Encounter (Signed)
[facility] 

## 2018-09-14 ENCOUNTER — Encounter: Payer: Self-pay | Admitting: Nurse Practitioner

## 2018-09-14 ENCOUNTER — Ambulatory Visit (INDEPENDENT_AMBULATORY_CARE_PROVIDER_SITE_OTHER): Payer: Medicare Other | Admitting: Nurse Practitioner

## 2018-09-14 VITALS — BP 130/70 | HR 94 | Temp 98.2°F | Ht 68.0 in | Wt 219.0 lb

## 2018-09-14 DIAGNOSIS — Z794 Long term (current) use of insulin: Secondary | ICD-10-CM

## 2018-09-14 DIAGNOSIS — I509 Heart failure, unspecified: Secondary | ICD-10-CM

## 2018-09-14 DIAGNOSIS — E1122 Type 2 diabetes mellitus with diabetic chronic kidney disease: Secondary | ICD-10-CM | POA: Diagnosis not present

## 2018-09-14 DIAGNOSIS — N183 Chronic kidney disease, stage 3 (moderate): Secondary | ICD-10-CM

## 2018-09-14 LAB — BASIC METABOLIC PANEL WITH GFR
BUN/Creatinine Ratio: 23 (calc) — ABNORMAL HIGH (ref 6–22)
BUN: 37 mg/dL — ABNORMAL HIGH (ref 7–25)
CO2: 32 mmol/L (ref 20–32)
Calcium: 10.8 mg/dL — ABNORMAL HIGH (ref 8.6–10.4)
Chloride: 100 mmol/L (ref 98–110)
Creat: 1.64 mg/dL — ABNORMAL HIGH (ref 0.60–0.88)
GFR, Est African American: 32 mL/min/{1.73_m2} — ABNORMAL LOW (ref >=60)
GFR, Est Non African American: 28 mL/min/{1.73_m2} — ABNORMAL LOW (ref >=60)
Glucose, Bld: 310 mg/dL — ABNORMAL HIGH (ref 65–139)
Potassium: 5.1 mmol/L (ref 3.5–5.3)
Sodium: 140 mmol/L (ref 135–146)

## 2018-09-14 NOTE — Progress Notes (Signed)
Careteam: Patient Care Team: Lauree Chandler, NP as PCP - General (Nurse Practitioner) Larey Dresser, MD as PCP - Advanced Heart Failure (Cardiology) Tanda Rockers, MD as Consulting Physician (Pulmonary Disease) Larey Dresser, MD as Consulting Physician (Cardiology)  Advanced Directive information    Allergies  Allergen Reactions  . Penicillins Other (See Comments)    DID THE REACTION INVOLVE: Swelling of the face/tongue/throat, SOB, or low BP? Yes Sudden or severe rash/hives, skin peeling, or the inside of the mouth or nose? No Did it require medical treatment? No When did it last happen?more than 10 years  If all above answers are "NO", may proceed with cephalosporin use.   Throat felt tight    Chief Complaint  Patient presents with  . Acute Visit    blood sugars elevated     HPI: Patient is a 85 y.o. female seen in the office today due to elevated blood sugars.  She has been drinking ensures for breakfast plus egg and toast. Took blood sugar after and it was 237. Also completing prednisone taper.    Questions what her blood sugar should be in the morning.  When should she be taking her blood sugar.  How often should she be taking.   She was worried over it being high.   Taking tradjenta 5 mg daily and levemir 24 units daily in the evening.   No hypoglycemia.  Reports vision and hearing is better  Review of Systems:  Review of Systems  Constitutional: Negative for chills, fever and weight loss.  Respiratory: Negative for cough and sputum production.   Cardiovascular: Negative for chest pain and palpitations.  Gastrointestinal: Negative for abdominal pain, constipation, diarrhea, heartburn, nausea and vomiting.  Genitourinary: Negative for dysuria, frequency and urgency.  Skin: Negative for itching and rash.  Neurological: Negative for dizziness, weakness and headaches.  Endo/Heme/Allergies: Negative for polydipsia.    Past Medical  History:  Diagnosis Date  . Arthritis    bilateral knees  . Chronic diastolic CHF (congestive heart failure) ([place])   . CKD (chronic kidney disease), stage III ([place])   . Diabetes mellitus without complication ([place])   . Edema   . Hyperlipidemia   . Hypertension   . Hypothyroidism   . Pulmonary embolism ([place])   . Urinary incontinence    Past Surgical History:  Procedure Laterality Date  . BLADDER SURGERY  2000   Dr.Tannerbaum (Bladder Tact)  . CATARACT EXTRACTION  03/2013   Dr.Shapiro  . CHOLECYSTECTOMY    . EYE SURGERY     bilateral cataracts  . [facility]   . right elbow  1999   x 2, still has one piece of metal in it  . TONSILLECTOMY AND ADENOIDECTOMY  1937   Dr.Brewer   Social History:   reports that she has never smoked. She has never used smokeless tobacco. She reports that she does not drink alcohol or use drugs.  Family History  Problem Relation Age of Onset  . Suicidality Father   . Cancer Brother   . Cancer Maternal Grandfather   . Diabetes Maternal Grandmother   . Melanoma Brother     Medications: Patient's Medications  New Prescriptions   No medications on file  Previous Medications   ALENDRONATE (FOSAMAX) 70 MG TABLET    TAKE 1 TABLET BY MOUTH ONCE WEEKLY TAKE WITH FULL GLASS OF WATER ON AN EMPTY STOMACH   AMLODIPINE (NORVASC) 5 MG TABLET  Take 1 tablet (5 mg total) by mouth daily.   APIXABAN (ELIQUIS) 5 MG TABS TABLET    Take 2.5 mg by mouth 2 (two) times daily. Takes 1/2 tab   BD PEN NEEDLE NANO U/F 32G X 4 MM MISC    USE AS DIRECTED ONCE DAILY   CALCIUM CARBONATE-VITAMIN D (CALTRATE 600+D) 600-400 MG-UNIT TABLET    Take 1 tablet by mouth 2 (two) times daily.   CHOLECALCIFEROL (VITAMIN D3) 5000 UNITS CHEW    Chew 5,000 Units by mouth daily.    DONEPEZIL (ARICEPT) 10 MG TABLET    TAKE ONE TABLET BY MOUTH ONCE DAILY AT BEDTIME TO PRESERVE MEMORY   FEEDING SUPPLEMENT, ENSURE COMPLETE, (ENSURE COMPLETE) LIQD    Take  237 mLs by mouth 2 (two) times daily between meals as needed (As requested by patient).   FUROSEMIDE (LASIX) 20 MG TABLET    Take 1 tablet (20 mg total) by mouth daily.   HYDROXYPROPYL METHYLCELLULOSE / HYPROMELLOSE (ISOPTO TEARS / GONIOVISC) 2.5 % OPHTHALMIC SOLUTION    Place 1 drop into both eyes 3 (three) times daily as needed for dry eyes.   LEVEMIR FLEXTOUCH 100 UNIT/ML PEN    INJECT 24 UNITS INTO THE SKIN DAILY.   LEVOTHYROXINE (SYNTHROID, LEVOTHROID) 100 MCG TABLET    TAKE 1 TABLET BY MOUTH EVERY DAY BEFORE BREAKFAST   MULTIPLE VITAMIN (MULTIVITAMIN WITH MINERALS) TABS TABLET    Take 1 tablet by mouth daily.   ONE TOUCH ULTRA TEST TEST STRIP    Use daily to test blood sugar. Dx: I96.78   [facility] DELICA LANCETS 93Y MISC    Use every other day to test blood sugar. Dx: E11.22   PANTOPRAZOLE (PROTONIX) 40 MG TABLET    Take 1 tablet (40 mg total) by mouth daily.   POTASSIUM CHLORIDE SA (K-DUR,KLOR-CON) 20 MEQ TABLET    Take 1 tablet (20 mEq total) by mouth daily.   ROSUVASTATIN (CRESTOR) 20 MG TABLET    TAKE 1 TABLET EVERY DAY FOR CHOLESTEROL   TRADJENTA 5 MG TABS TABLET    TAKE 1 TABLET BY MOUTH EVERY DAY  Modified Medications   No medications on file  Discontinued Medications   BLOOD GLUCOSE MONITORING SUPPL (ONE TOUCH ULTRA 2) W/DEVICE KIT    1 Device by Does not apply route daily. Dx: E11.22   PREDNISONE (DELTASONE) 10 MG TABLET    Take 30 mg for the first 3 days then 20 mg for the next 3 days and then 10 mg daily till done.     Physical Exam:  Vitals:   09/14/18 1430  BP: 130/70  Pulse: 94  Temp: 98.2 F (36.8 C)  TempSrc: Oral  SpO2: 97%  Weight: 219 lb (99.3 kg)  Height: '5\' 8"'$  (1.727 m)   Body mass index is 33.3 kg/m.  Physical Exam Constitutional:      General: She is not in acute distress.    Appearance: She is well-developed. She is not diaphoretic.  HENT:     Head: Normocephalic and atraumatic.  Eyes:     Conjunctiva/sclera: Conjunctivae normal.     Pupils:  Pupils are equal, round, and reactive to light.  Neck:     Musculoskeletal: Normal range of motion and neck supple.  Cardiovascular:     Rate and Rhythm: Normal rate and regular rhythm.     Heart sounds: Murmur present.  Pulmonary:     Effort: Pulmonary effort is normal.     Breath sounds: Normal breath sounds.  Abdominal:     General: Bowel sounds are normal.     Palpations: Abdomen is soft.  Musculoskeletal:        General: No tenderness.     Right lower leg: No edema.     Left lower leg: No edema.  Skin:    General: Skin is warm and dry.  Neurological:     Mental Status: She is alert and oriented to person, place, and time.     Gait: Gait abnormal (uses walker).     Labs reviewed: Basic Metabolic Panel: Recent Labs    02/23/18 1120  08/30/18 0224 08/31/18 0508 09/03/18 1355  NA  --    < > 139 137 144  K  --    < > 3.9 4.0 4.0  CL  --    < > 103 101 105  CO2  --    < > '28 27 31  '$ GLUCOSE  --    < > 122* 251* 132*  BUN  --    < > 26* 34* 39*  CREATININE  --    < > 1.23* 1.47* 1.34*  CALCIUM  --    < > 9.2 9.1 9.3  TSH 0.63  --   --   --   --    < > = values in this interval not displayed.   Liver Function Tests: Recent Labs    02/05/18 1049 08/30/18 0224 09/03/18 1355  AST 18 28 50*  ALT 14 20 50*  ALKPHOS  --  40 45  BILITOT 0.5 0.7 0.8  PROT 6.8 6.3* 6.7  ALBUMIN  --  3.5 3.6   No results for input(s): LIPASE, AMYLASE in the last 8760 hours. No results for input(s): AMMONIA in the last 8760 hours. CBC: Recent Labs    02/27/18 1528 08/30/18 0224 08/31/18 0508 09/03/18 1355  WBC 7.9 8.1 9.8 10.8*  NEUTROABS 4.9 6.4  --  7.8*  HGB 14.2 13.5 12.4 15.0  HCT 42.7 43.5 40.1 48.0*  MCV 88.6 93.1 92.8 92.7  PLT 171.0 131* 141* 192   Lipid Panel: Recent Labs    02/23/18 1120  CHOL 166  HDL 64  LDLCALC 77  TRIG 151*  CHOLHDL 2.6   TSH: Recent Labs    02/23/18 1120  TSH 0.63   A1C: Lab Results  Component Value Date   HGBA1C 6.8 (H)  05/28/2018     Assessment/Plan 1. Type 2 diabetes mellitus with stage 3 chronic kidney disease, with long-term current use of insulin (HCC) -pt A1c of 6.8, to continue on diabetic diet. No hypoglycemia. To take blood sugars fasting a few times a week or if she has signs of hypoglycemia (discussed at OV)  -continue on tradjenta 5 mg daily with levemir 24 unit daily  -due to age and comorbidities goal for fasting blood sugar to be less than 150 -encouraged low sugar supplements such as glucerna vs ensure.  2. Chronic congestive heart failure, unspecified heart failure type ([place]) -has been taking lasix 20 mg daily with potassium supplement (new/started in hospital). Will follow up on renal function and potassium at this time.  - BASIC METABOLIC PANEL WITH GFR   Robbyn Hodkinson K. [place], [facility] [phone]

## 2018-09-14 NOTE — Patient Instructions (Signed)
Ideally blood sugars fasting less than 150  Can take blood sugar every other day and when you feel off

## 2018-09-17 ENCOUNTER — Telehealth: Payer: Self-pay

## 2018-09-17 MED ORDER — FUROSEMIDE 20 MG PO TABS: 10.0000 mg | ORAL_TABLET | Freq: Every day | ORAL | 0 refills | Status: DC

## 2018-09-17 NOTE — Telephone Encounter (Signed)
-----   Message from Lauree Chandler, NP sent at 09/17/2018 12:27 PM EST ----- Worsening kidney function and potassium is on the high end.  Lets have her reduce lasix to 10 mg daily and STOP potassium.  I would like to follow up with her in office in 2 weeks to check on her and recheck labs

## 2018-09-20 ENCOUNTER — Other Ambulatory Visit: Payer: Self-pay

## 2018-09-20 ENCOUNTER — Ambulatory Visit (HOSPITAL_COMMUNITY)
Admission: RE | Admit: 2018-09-20 | Discharge: 2018-09-20 | Disposition: A | Discharge status: Home / Self Care | Payer: Medicare Other | Source: Ambulatory Visit | Attending: Internal Medicine | Admitting: Internal Medicine

## 2018-09-20 VITALS — BP 140/86 | HR 93 | Wt 222.8 lb

## 2018-09-20 DIAGNOSIS — I10 Essential (primary) hypertension: Secondary | ICD-10-CM

## 2018-09-20 DIAGNOSIS — M1990 Unspecified osteoarthritis, unspecified site: Secondary | ICD-10-CM | POA: Insufficient documentation

## 2018-09-20 DIAGNOSIS — E1122 Type 2 diabetes mellitus with diabetic chronic kidney disease: Secondary | ICD-10-CM | POA: Diagnosis not present

## 2018-09-20 DIAGNOSIS — N183 Chronic kidney disease, stage 3 (moderate): Secondary | ICD-10-CM | POA: Diagnosis not present

## 2018-09-20 DIAGNOSIS — I509 Heart failure, unspecified: Secondary | ICD-10-CM | POA: Diagnosis not present

## 2018-09-20 DIAGNOSIS — E039 Hypothyroidism, unspecified: Secondary | ICD-10-CM | POA: Diagnosis not present

## 2018-09-20 DIAGNOSIS — Z7901 Long term (current) use of anticoagulants: Secondary | ICD-10-CM | POA: Diagnosis not present

## 2018-09-20 DIAGNOSIS — E785 Hyperlipidemia, unspecified: Secondary | ICD-10-CM | POA: Diagnosis not present

## 2018-09-20 DIAGNOSIS — E7849 Other hyperlipidemia: Secondary | ICD-10-CM

## 2018-09-20 DIAGNOSIS — Z79890 Hormone replacement therapy (postmenopausal): Secondary | ICD-10-CM | POA: Diagnosis not present

## 2018-09-20 DIAGNOSIS — I50810 Right heart failure, unspecified: Secondary | ICD-10-CM | POA: Diagnosis present

## 2018-09-20 DIAGNOSIS — Z79899 Other long term (current) drug therapy: Secondary | ICD-10-CM | POA: Diagnosis not present

## 2018-09-20 DIAGNOSIS — I130 Hypertensive heart and chronic kidney disease with heart failure and stage 1 through stage 4 chronic kidney disease, or unspecified chronic kidney disease: Secondary | ICD-10-CM | POA: Diagnosis not present

## 2018-09-20 DIAGNOSIS — Z794 Long term (current) use of insulin: Secondary | ICD-10-CM | POA: Diagnosis not present

## 2018-09-20 DIAGNOSIS — I5032 Chronic diastolic (congestive) heart failure: Secondary | ICD-10-CM | POA: Diagnosis not present

## 2018-09-20 LAB — BASIC METABOLIC PANEL
Anion gap: 11 (ref 5–15)
BUN: 27 mg/dL — AB (ref 8–23)
CO2: 26 mmol/L (ref 22–32)
Calcium: 9.8 mg/dL (ref 8.9–10.3)
Chloride: 103 mmol/L (ref 98–111)
Creatinine, Ser: 1.36 mg/dL — ABNORMAL HIGH (ref 0.44–1.00)
GFR calc Af Amer: 40 mL/min — ABNORMAL LOW (ref >60)
GFR calc non Af Amer: 35 mL/min — ABNORMAL LOW (ref >60)
GLUCOSE: 172 mg/dL — AB (ref 70–99)
Potassium: 4.1 mmol/L (ref 3.5–5.1)
Sodium: 140 mmol/L (ref 135–145)

## 2018-09-20 NOTE — Progress Notes (Signed)
Patient ID: Melissa Shannon, female   DOB: 01/16/1928, 85 y.o.   MRN: [id number] PCP: Dr. Bubba Camp Cardiology: Dr. Aundra Dubin  85 y.o. with history of HTN, DM, and immobility due to osteoarthritis presents for followup of right heart failure and PE.  At baseline, patient is not very active.  She has knee arthritis that is very limiting.  She does not do much walking and uses a walker to get around.  She has been very limited for a few years now.  She was admitted in 10/15 with dyspnea.  The dyspnea began about 2-3 days prior to admission.  She would get short of breath after walking about 20 feet with her walker.  She was noted to be in CHF at admission with troponin elevated to 0.6.  Echo showed normal LV size and systolic function but severely dilated/dysfunctional RV with pulmonary hypertension.  V/Q scan was done and showed intermediate probability for PE.  She was seen by pulmonary and thought to have a PE.  Apixaban was started.  She was diuresed with IV Lasix.  Repeat echo was done 3/16.  This showed EF 55-60%, normal RV size and systolic function, and PA systolic pressure 28 mmHg.  V/Q scan in 5/16 was normal.   She was admitted in 4/16 with altered mental status, likely due to E coli urosepsis.   She was admitted 08/30/18 with increased shortness of breath. She was wheezing on exam. VQ scan was negative.  Lisinopril was stopped and she  Improved. Pulmonary consulted with recommendations to not re challenge with lisinopril. She was discharge home on 09/02/18. She returned to the ED the next day with confusion. Work up was negative and she improved without intervention. She discharged to home the same day.   Today she returns or HF follow up. Overall feeling fine. Denies PND/Orthopnea. Mild dyspnea with exertion.  Chronic lower extremity edema. Appetite ok. No fever or chills. Weight at home stable.  Taking all medications. Lives alone and has assistance from her family.   Labs (06/18/14): K 4.4, creatinine  1.44, HCT 35.9, BNP 10167 => 1415 Labs (09/01/2014): K 4.2 Creatinine 1.23  Labs (2/16): K 4.3, creatinine 1.62, HCT 40.7 Labs (7/16): K 5.2, creatinine 1.66 Labs (6/17): creatinine 1.25, LDL 64 Labs (7/17): HCT 43.5 Labs (10/17): K 4.9, creatinine 1.44, BNP 25 Labs (2/18): K 4.6, creatinine 1.27 Labs (7/19): LDL 77, pro-BNP 34, hgb 14.2 Labs (09/20/18) K 4.1 Creatinine 1.36  PMH: 1. HTN 2. Hyperlipidemia 3. Osteoarthritis: Involving knees, uses walker.  4. Type II diabetes 5. H/o CCY 6. CKD stage III 7. Hypothyroidism 8. PE: Admitted 10/15 with dyspnea/CHF.  Echo (10/15) with EF 60-65%, D-shaped interventricular septum, severely dilated RV with moderately decreased RV systolic function, PA systolic pressure 47 mmHg.  V/Q scan (10/15) was intermediate probability for PE. Patient was started on apixaban.  Echo (3/16) with EF 55-60%, basal inferior hypokinesis, normal RV size and systolic function, PA systolic pressure 28 mmHg. V/Q scan 5/16 was normal.  9. Nodules on CT chest 10/15: Per Dr Melvyn Novas, likely benign findings.  10. GERD 11. ECHO 08/2018 EF 55-60% mild LVH   FH: No h/o venous thromboembolism, no heart problems that she knows of.   SH: Widow, lives alone, nonsmoker, 3 kids.   ROS: All systems reviewed and negative except as per HPI.   Current Outpatient Medications  Medication Sig Dispense Refill  . alendronate (FOSAMAX) 70 MG tablet TAKE 1 TABLET BY MOUTH ONCE WEEKLY TAKE WITH FULL GLASS  OF WATER ON AN EMPTY STOMACH 12 tablet 6  . amLODipine (NORVASC) 5 MG tablet Take 1 tablet (5 mg total) by mouth daily. 30 tablet 1  . apixaban (ELIQUIS) 5 MG TABS tablet Take 2.5 mg by mouth 2 (two) times daily. Takes 1/2 tab    . BD PEN NEEDLE NANO U/F 32G X 4 MM MISC USE AS DIRECTED ONCE DAILY 100 each 11  . Calcium Carbonate-Vitamin D (CALTRATE 600+D) 600-400 MG-UNIT tablet Take 1 tablet by mouth 2 (two) times daily.    . Cholecalciferol (VITAMIN D3) 5000 UNITS CHEW Chew 5,000 Units by  mouth daily.     Marland Kitchen donepezil (ARICEPT) 10 MG tablet TAKE ONE TABLET BY MOUTH ONCE DAILY AT BEDTIME TO PRESERVE MEMORY 90 tablet 1  . furosemide (LASIX) 20 MG tablet Take 0.5 tablets (10 mg total) by mouth daily. 30 tablet 0  . LEVEMIR FLEXTOUCH 100 UNIT/ML Pen INJECT 24 UNITS INTO THE SKIN DAILY. 15 pen 5  . levothyroxine (SYNTHROID, LEVOTHROID) 100 MCG tablet TAKE 1 TABLET BY MOUTH EVERY DAY BEFORE BREAKFAST 90 tablet 2  . Multiple Vitamin (MULTIVITAMIN WITH MINERALS) TABS tablet Take 1 tablet by mouth daily. 30 tablet 0  . Nutritional Supplements (GLUCERNA SHAKE PO) Take by mouth.    . ONE TOUCH ULTRA TEST test strip Use daily to test blood sugar. Dx: E11.22 100 each 1  . ONETOUCH DELICA LANCETS 85Y MISC Use every other day to test blood sugar. Dx: E11.22 100 each 3  . rosuvastatin (CRESTOR) 20 MG tablet TAKE 1 TABLET EVERY DAY FOR CHOLESTEROL 90 tablet 1  . TRADJENTA 5 MG TABS tablet TAKE 1 TABLET BY MOUTH EVERY DAY 30 tablet 6   No current facility-administered medications for this encounter.    BP 140/86   Pulse 93   Wt 101.1 kg (222 lb 12.8 oz)   SpO2 95%   BMI 33.88 kg/m   Wt Readings from Last 3 Encounters:  09/20/18 101.1 kg (222 lb 12.8 oz)  09/14/18 99.3 kg (219 lb)  09/06/18 97.1 kg (214 lb)    General:  Elderly. No resp difficulty HEENT: normal Neck: supple. no JVD. Carotids 2+ bilat; no bruits. No lymphadenopathy or thryomegaly appreciated. Cor: PMI nondisplaced. Regular rate & rhythm. No rubs, gallops or murmurs. Lungs: clear Abdomen: soft, nontender, nondistended. No hepatosplenomegaly. No bruits or masses. Good bowel sounds. Extremities: no cyanosis, clubbing, rash, chronic lymph edema RLE and LLE  Neuro: alert & orientedx3, cranial nerves grossly intact. moves all 4 extremities w/o difficulty. Affect pleasant  Assessment/Plan 1. Right heart failure: Echo on 10/15 showed dilated RV with decreased systolic function.  V/Q scan with suspected PE.  3/16 echo showed  normal RV size and systolic function with normal estimate PA systolic pressure.  5/16 V/Q scan showed no PE.  ECHO 08/2018 with normal LVEF and normal RV.  NYHA II-III. Continue lasix 10 mg daily. Check BMET today.   2. PE: V/Q scan intermediate probability for PE in 10/15.  Pulmonary saw and suspect that she did indeed have PE.  The PE was spontaneous.  She is at risk given general immobility. No bleeding problems.  She did not have evidence for chronic PE on 5/16 V/Q scan.  No bleeding issues   - Continue apixaban at 2.5 mg bid  3. HTN: Stable.   Stay off lisinopril was possible tracheal edema from [place] 4. Hyperlipidemia: Continue Crestor, good LDL in 7/19.    Follow up 6 months with Dr Aundra Dubin.  Mariel Gaudin NP-C  09/20/2018

## 2018-09-20 NOTE — Patient Instructions (Signed)
Good News, No medication changes today!  Labs today We will only contact you if something comes back abnormal or we need to make some changes. Otherwise no news is good news!   Your physician recommends that you schedule a follow-up appointment in: 4 months with Dr. Aundra Dubin  Do the following things EVERYDAY: 1) Weigh yourself in the morning before breakfast. Write it down and keep it in a log. 2) Take your medicines as prescribed 3) Eat low salt foods-Limit salt (sodium) to 2000 mg per day.  4) Stay as active as you can everyday 5) Limit all fluids for the day to less than 2 liters

## 2018-10-01 ENCOUNTER — Encounter: Payer: Self-pay | Admitting: Nurse Practitioner

## 2018-10-01 ENCOUNTER — Other Ambulatory Visit: Payer: Self-pay | Admitting: Nurse Practitioner

## 2018-10-01 ENCOUNTER — Ambulatory Visit (INDEPENDENT_AMBULATORY_CARE_PROVIDER_SITE_OTHER): Payer: Medicare Other | Admitting: Nurse Practitioner

## 2018-10-01 VITALS — BP 132/76 | HR 84 | Temp 98.0°F | Ht 68.0 in | Wt 223.2 lb

## 2018-10-01 DIAGNOSIS — N3281 Overactive bladder: Secondary | ICD-10-CM | POA: Diagnosis not present

## 2018-10-01 DIAGNOSIS — Z794 Long term (current) use of insulin: Secondary | ICD-10-CM

## 2018-10-01 DIAGNOSIS — E1122 Type 2 diabetes mellitus with diabetic chronic kidney disease: Secondary | ICD-10-CM | POA: Diagnosis not present

## 2018-10-01 DIAGNOSIS — I509 Heart failure, unspecified: Secondary | ICD-10-CM | POA: Diagnosis not present

## 2018-10-01 DIAGNOSIS — N183 Chronic kidney disease, stage 3 (moderate): Secondary | ICD-10-CM | POA: Diagnosis not present

## 2018-10-01 MED ORDER — MIRABEGRON ER 25 MG PO TB24: 25.0000 mg | ORAL_TABLET | Freq: Every day | ORAL | 0 refills | Status: DC

## 2018-10-01 MED ORDER — FUROSEMIDE 20 MG PO TABS: 20.0000 mg | ORAL_TABLET | Freq: Every day | ORAL | 1 refills | Status: DC

## 2018-10-01 NOTE — Patient Instructions (Signed)
Continue to stay hydrated but Limited fluids 2-3 hours before bed  Follow up in 3 months with Lab work prior to visit

## 2018-10-01 NOTE — Progress Notes (Signed)
Careteam: Patient Care Team: Lauree Chandler, NP as PCP - General (Nurse Practitioner) Larey Dresser, MD as PCP - Advanced Heart Failure (Cardiology) Tanda Rockers, MD as Consulting Physician (Pulmonary Disease) Larey Dresser, MD as Consulting Physician (Cardiology)  Advanced Directive information Does Patient Have a Medical Advance Directive?: No, Does patient want to make changes to medical advance directive?: No - Patient declined  Allergies  Allergen Reactions  . Lisinopril Shortness Of Breath    Tracheal Edema  . Penicillins Other (See Comments)    DID THE REACTION INVOLVE: Swelling of the face/tongue/throat, SOB, or low BP? Yes Sudden or severe rash/hives, skin peeling, or the inside of the mouth or nose? No Did it require medical treatment? No When did it last happen?more than 10 years  If all above answers are "NO", may proceed with cephalosporin use.   Throat felt tight    Chief Complaint  Patient presents with  . Medical Management of Chronic Issues    2 week follow up with lab, ear clogged, changing pad every 2 hours from urinating      HPI: Patient is a 85 y.o. female seen in the office today for follow up  Pts lasix was increase during hospitalization and potassium was increased however renal function worsened. She was instructed to decrease lasix and follow with cardiology. However she has continue to take 20 mg daily but stopped potassium. Reports she is so much better.  Taking lasix first thing in the morning. No swelling, shortness of breath  OAB- urine negative on 1/20, changing pad frequently. Not worried about this during the day, more so at night.   Blood sugars are staying from 80-120, occasionally 140s  CKD- improved renal function on last labs, reports she is limited to 2L a day but does not think she gets this.   Review of Systems:  Review of Systems  Constitutional: Negative for chills, fever and weight loss.  Respiratory:  Negative for cough and sputum production.   Cardiovascular: Negative for chest pain and palpitations.  Gastrointestinal: Negative for abdominal pain, constipation, diarrhea, heartburn, nausea and vomiting.  Genitourinary: Positive for frequency. Negative for dysuria, flank pain, hematuria and urgency.  Skin: Negative for itching and rash.  Neurological: Negative for dizziness, weakness and headaches.  Endo/Heme/Allergies: Negative for polydipsia.    Past Medical History:  Diagnosis Date  . Arthritis    bilateral knees  . Chronic diastolic CHF (congestive heart failure) ([place])   . CKD (chronic kidney disease), stage III ([place])   . Diabetes mellitus without complication ([place])   . Edema   . Hyperlipidemia   . Hypertension   . Hypothyroidism   . Pulmonary embolism ([place])   . Urinary incontinence    Past Surgical History:  Procedure Laterality Date  . BLADDER SURGERY  2000   Dr.Tannerbaum (Bladder Tact)  . CATARACT EXTRACTION  03/2013   Dr.Shapiro  . CHOLECYSTECTOMY    . EYE SURGERY     bilateral cataracts  . [facility]   . right elbow  1999   x 2, still has one piece of metal in it  . TONSILLECTOMY AND ADENOIDECTOMY  1937   Dr.Brewer   Social History:   reports that she has never smoked. She has never used smokeless tobacco. She reports that she does not drink alcohol or use drugs.  Family History  Problem Relation Age of Onset  . Suicidality Father   . Cancer  Brother   . Cancer Maternal Grandfather   . Diabetes Maternal Grandmother   . Melanoma Brother     Medications: Patient's Medications  New Prescriptions   No medications on file  Previous Medications   ALENDRONATE (FOSAMAX) 70 MG TABLET    TAKE 1 TABLET BY MOUTH ONCE WEEKLY TAKE WITH FULL GLASS OF WATER ON AN EMPTY STOMACH   AMLODIPINE (NORVASC) 5 MG TABLET    Take 1 tablet (5 mg total) by mouth daily.   APIXABAN (ELIQUIS) 5 MG TABS TABLET    Take 2.5 mg by mouth 2 (two)  times daily. Takes 1/2 tab   BD PEN NEEDLE NANO U/F 32G X 4 MM MISC    USE AS DIRECTED ONCE DAILY   CALCIUM CARBONATE-VITAMIN D (CALTRATE 600+D) 600-400 MG-UNIT TABLET    Take 1 tablet by mouth 2 (two) times daily.   CHOLECALCIFEROL (VITAMIN D3) 5000 UNITS CHEW    Chew 5,000 Units by mouth daily.    DONEPEZIL (ARICEPT) 10 MG TABLET    TAKE ONE TABLET BY MOUTH ONCE DAILY AT BEDTIME TO PRESERVE MEMORY   FUROSEMIDE (LASIX) 20 MG TABLET    Take 0.5 tablets (10 mg total) by mouth daily.   LEVEMIR FLEXTOUCH 100 UNIT/ML PEN    INJECT 24 UNITS INTO THE SKIN DAILY.   LEVOTHYROXINE (SYNTHROID, LEVOTHROID) 100 MCG TABLET    TAKE 1 TABLET BY MOUTH EVERY DAY BEFORE BREAKFAST   MULTIPLE VITAMIN (MULTIVITAMIN WITH MINERALS) TABS TABLET    Take 1 tablet by mouth daily.   NUTRITIONAL SUPPLEMENTS (GLUCERNA SHAKE PO)    Take by mouth.   ONE TOUCH ULTRA TEST TEST STRIP    Use daily to test blood sugar. Dx: P37.90   ONETOUCH DELICA LANCETS 24O MISC    USE EVERY OTHER DAY TO TEST BLOOD SUGAR   ROSUVASTATIN (CRESTOR) 20 MG TABLET    TAKE 1 TABLET EVERY DAY FOR CHOLESTEROL   TRADJENTA 5 MG TABS TABLET    TAKE 1 TABLET BY MOUTH EVERY DAY  Modified Medications   No medications on file  Discontinued Medications   No medications on file     Physical Exam:  Vitals:   10/01/18 0931  BP: 132/76  Pulse: 84  Temp: 98 F (36.7 C)  TempSrc: Oral  SpO2: 96%  Weight: 223 lb 3.2 oz (101.2 kg)  Height: 5\' 8"  (1.727 m)   Body mass index is 33.94 kg/m.  Physical Exam Constitutional:      General: She is not in acute distress.    Appearance: She is well-developed. She is not diaphoretic.  HENT:     Head: Normocephalic and atraumatic.  Eyes:     Conjunctiva/sclera: Conjunctivae normal.     Pupils: Pupils are equal, round, and reactive to light.  Neck:     Musculoskeletal: Normal range of motion and neck supple.  Cardiovascular:     Rate and Rhythm: Normal rate and regular rhythm.     Heart sounds: Murmur  present.  Pulmonary:     Effort: Pulmonary effort is normal.     Breath sounds: Normal breath sounds.  Abdominal:     General: Bowel sounds are normal.     Palpations: Abdomen is soft.  Musculoskeletal:        General: No tenderness.     Right lower leg: No edema.     Left lower leg: No edema.  Skin:    General: Skin is warm and dry.  Neurological:     Mental  Status: She is alert and oriented to person, place, and time.     Gait: Gait abnormal (uses walker).     Labs reviewed: Basic Metabolic Panel: Recent Labs    02/23/18 1120  09/03/18 1355 09/14/18 1516 09/20/18 1410  NA  --    < > 144 140 140  K  --    < > 4.0 5.1 4.1  CL  --    < > 105 100 103  CO2  --    < > 31 32 26  GLUCOSE  --    < > 132* 310* 172*  BUN  --    < > 39* 37* 27*  CREATININE  --    < > 1.34* 1.64* 1.36*  CALCIUM  --    < > 9.3 10.8* 9.8  TSH 0.63  --   --   --   --    < > = values in this interval not displayed.   Liver Function Tests: Recent Labs    02/05/18 1049 08/30/18 0224 09/03/18 1355  AST 18 28 50*  ALT 14 20 50*  ALKPHOS  --  40 45  BILITOT 0.5 0.7 0.8  PROT 6.8 6.3* 6.7  ALBUMIN  --  3.5 3.6   No results for input(s): LIPASE, AMYLASE in the last 8760 hours. No results for input(s): AMMONIA in the last 8760 hours. CBC: Recent Labs    02/27/18 1528 08/30/18 0224 08/31/18 0508 09/03/18 1355  WBC 7.9 8.1 9.8 10.8*  NEUTROABS 4.9 6.4  --  7.8*  HGB 14.2 13.5 12.4 15.0  HCT 42.7 43.5 40.1 48.0*  MCV 88.6 93.1 92.8 92.7  PLT 171.0 131* 141* 192   Lipid Panel: Recent Labs    02/23/18 1120  CHOL 166  HDL 64  LDLCALC 77  TRIG 151*  CHOLHDL 2.6   TSH: Recent Labs    02/23/18 1120  TSH 0.63   A1C: Lab Results  Component Value Date   HGBA1C 6.8 (H) 05/28/2018     Assessment/Plan 1. Type 2 diabetes mellitus with stage 3 chronic kidney disease, with long-term current use of insulin (HCC) -blood sugars reviewed and in ideal range. To continue dietary  modifications with tradjenta 5 mg daily - Hemoglobin A1c; Future - COMPLETE METABOLIC PANEL WITH GFR; Future  2. Chronic congestive heart failure, unspecified heart failure type ([place]) -stable. She has been taking lasix 20 mg daily. Renal function improved on recent labs. Encouraged to stay hydrated but maintain fluid restriction.  - furosemide (LASIX) 20 MG tablet; Take 1 tablet (20 mg total) by mouth daily.  Dispense: 30 tablet; Refill: 1 - CBC with Differential/Platelets; Future  3. Overactive bladder -encouraged to limit fluids 2-3 hours prior to bedtime. Routine toileting throughout the day.   Next appt: 3 months with labs prior to visit.  Carlos American. [place], [facility] [phone]

## 2018-10-23 ENCOUNTER — Other Ambulatory Visit: Payer: Self-pay

## 2018-10-23 NOTE — Patient Outreach (Signed)
[place] [facility]) Care Management  10/23/2018  Melissa Shannon [birth year] [id number]    Medication Adherence call to [name] Compliant Voice message left with a call back number. Telephone call to Patient regarding Medication Adherence unable to reach patient   [place] Management Direct Dial [phone]  Fax [phone] [email or web address]

## 2018-10-28 ENCOUNTER — Other Ambulatory Visit (HOSPITAL_COMMUNITY): Payer: Self-pay | Admitting: Nurse Practitioner

## 2018-11-08 ENCOUNTER — Other Ambulatory Visit: Payer: Self-pay | Admitting: Nurse Practitioner

## 2018-11-08 DIAGNOSIS — R413 Other amnesia: Secondary | ICD-10-CM

## 2018-12-06 ENCOUNTER — Ambulatory Visit: Payer: Medicare Other | Admitting: Nurse Practitioner

## 2018-12-09 ENCOUNTER — Encounter: Payer: Self-pay | Admitting: Nurse Practitioner

## 2018-12-22 ENCOUNTER — Other Ambulatory Visit: Payer: Self-pay | Admitting: Nurse Practitioner

## 2018-12-24 ENCOUNTER — Other Ambulatory Visit: Payer: Medicare Other | Admitting: Nurse Practitioner

## 2018-12-24 ENCOUNTER — Other Ambulatory Visit: Payer: Self-pay

## 2018-12-24 ENCOUNTER — Other Ambulatory Visit: Payer: Medicare Other

## 2018-12-24 DIAGNOSIS — E1169 Type 2 diabetes mellitus with other specified complication: Secondary | ICD-10-CM

## 2018-12-24 DIAGNOSIS — I509 Heart failure, unspecified: Secondary | ICD-10-CM

## 2018-12-24 DIAGNOSIS — E1122 Type 2 diabetes mellitus with diabetic chronic kidney disease: Secondary | ICD-10-CM

## 2018-12-24 DIAGNOSIS — E785 Hyperlipidemia, unspecified: Secondary | ICD-10-CM

## 2018-12-24 DIAGNOSIS — E034 Atrophy of thyroid (acquired): Secondary | ICD-10-CM

## 2018-12-25 LAB — LIPID PANEL
Cholesterol: 217 mg/dL — ABNORMAL HIGH (ref <200)
HDL: 65 mg/dL (ref >=50)
LDL Cholesterol (Calc): 116 mg/dL (calc) — ABNORMAL HIGH
Non-HDL Cholesterol (Calc): 152 mg/dL (calc) — ABNORMAL HIGH (ref <130)
Total CHOL/HDL Ratio: 3.3 (calc) (ref <5.0)
Triglycerides: 239 mg/dL — ABNORMAL HIGH (ref <150)

## 2018-12-25 LAB — HEMOGLOBIN A1C
Hgb A1c MFr Bld: 6.3 % of total Hgb — ABNORMAL HIGH (ref <5.7)
Mean Plasma Glucose: 134 (calc)
eAG (mmol/L): 7.4 (calc)

## 2018-12-25 LAB — CBC WITH DIFFERENTIAL/PLATELET
Absolute Monocytes: 688 cells/uL (ref 200–950)
Basophils Absolute: 81 cells/uL (ref 0–200)
Basophils Relative: 1.1 %
Eosinophils Absolute: 215 cells/uL (ref 15–500)
Eosinophils Relative: 2.9 %
HCT: 44.6 % (ref 35.0–45.0)
Hemoglobin: 14.7 g/dL (ref 11.7–15.5)
Lymphs Abs: 2634 cells/uL (ref 850–3900)
MCH: 29.6 pg (ref 27.0–33.0)
MCHC: 33 g/dL (ref 32.0–36.0)
MCV: 89.9 fL (ref 80.0–100.0)
MPV: 12.1 fL (ref 7.5–12.5)
Monocytes Relative: 9.3 %
Neutro Abs: 3781 cells/uL (ref 1500–7800)
Neutrophils Relative %: 51.1 %
Platelets: 203 10*3/uL (ref 140–400)
RBC: 4.96 10*6/uL (ref 3.80–5.10)
RDW: 12 % (ref 11.0–15.0)
Total Lymphocyte: 35.6 %
WBC: 7.4 10*3/uL (ref 3.8–10.8)

## 2018-12-25 LAB — COMPLETE METABOLIC PANEL WITH GFR
AG Ratio: 2.1 (calc) (ref 1.0–2.5)
ALT: 15 U/L (ref 6–29)
AST: 17 U/L (ref 10–35)
Albumin: 4.1 g/dL (ref 3.6–5.1)
Alkaline phosphatase (APISO): 54 U/L (ref 37–153)
BUN/Creatinine Ratio: 20 (calc) (ref 6–22)
BUN: 25 mg/dL (ref 7–25)
CO2: 31 mmol/L (ref 20–32)
Calcium: 11.2 mg/dL — ABNORMAL HIGH (ref 8.6–10.4)
Chloride: 104 mmol/L (ref 98–110)
Creat: 1.28 mg/dL — ABNORMAL HIGH (ref 0.60–0.88)
GFR, Est African American: 43 mL/min/{1.73_m2} — ABNORMAL LOW (ref >=60)
GFR, Est Non African American: 37 mL/min/{1.73_m2} — ABNORMAL LOW (ref >=60)
Globulin: 2 g/dL (calc) (ref 1.9–3.7)
Glucose, Bld: 155 mg/dL — ABNORMAL HIGH (ref 65–99)
Potassium: 4.1 mmol/L (ref 3.5–5.3)
Sodium: 141 mmol/L (ref 135–146)
Total Bilirubin: 0.5 mg/dL (ref 0.2–1.2)
Total Protein: 6.1 g/dL (ref 6.1–8.1)

## 2018-12-25 LAB — TSH: TSH: 0.47 mIU/L (ref 0.40–4.50)

## 2018-12-31 ENCOUNTER — Other Ambulatory Visit: Payer: Self-pay

## 2018-12-31 ENCOUNTER — Encounter: Payer: Self-pay | Admitting: Nurse Practitioner

## 2018-12-31 ENCOUNTER — Ambulatory Visit (INDEPENDENT_AMBULATORY_CARE_PROVIDER_SITE_OTHER): Payer: Medicare Other | Admitting: Nurse Practitioner

## 2018-12-31 VITALS — BP 134/80 | HR 88 | Temp 98.1°F | Ht 68.0 in | Wt 218.0 lb

## 2018-12-31 DIAGNOSIS — Z794 Long term (current) use of insulin: Secondary | ICD-10-CM

## 2018-12-31 DIAGNOSIS — Z86711 Personal history of pulmonary embolism: Secondary | ICD-10-CM

## 2018-12-31 DIAGNOSIS — M810 Age-related osteoporosis without current pathological fracture: Secondary | ICD-10-CM

## 2018-12-31 DIAGNOSIS — E785 Hyperlipidemia, unspecified: Secondary | ICD-10-CM

## 2018-12-31 DIAGNOSIS — E1122 Type 2 diabetes mellitus with diabetic chronic kidney disease: Secondary | ICD-10-CM

## 2018-12-31 DIAGNOSIS — I509 Heart failure, unspecified: Secondary | ICD-10-CM | POA: Diagnosis not present

## 2018-12-31 DIAGNOSIS — E1169 Type 2 diabetes mellitus with other specified complication: Secondary | ICD-10-CM

## 2018-12-31 DIAGNOSIS — N183 Chronic kidney disease, stage 3 unspecified: Secondary | ICD-10-CM

## 2018-12-31 DIAGNOSIS — E2839 Other primary ovarian failure: Secondary | ICD-10-CM

## 2018-12-31 DIAGNOSIS — E034 Atrophy of thyroid (acquired): Secondary | ICD-10-CM

## 2018-12-31 MED ORDER — ROSUVASTATIN CALCIUM 40 MG PO TABS: ORAL_TABLET | ORAL | 1 refills | Status: DC

## 2018-12-31 NOTE — Patient Instructions (Addendum)
Please check your  Medications at home and make sure you are taking CRESTOR (ROSUVASTATIN)   If you have been taking Crestor consistently we need to increase to 40 mg by mouth daily in the evening.  Continue to work on diet modifications.   Follow up in 3 months with LABS before visit

## 2018-12-31 NOTE — Progress Notes (Signed)
Careteam: Patient Care Team: Lauree Chandler, NP as PCP - General (Nurse Practitioner) Larey Dresser, MD as PCP - Advanced Heart Failure (Cardiology) Tanda Rockers, MD as Consulting Physician (Pulmonary Disease) Larey Dresser, MD as Consulting Physician (Cardiology)  Advanced Directive information Does Patient Have a Medical Advance Directive?: Yes, Type of Advance Directive: [place];Living will, Does patient want to make changes to medical advance directive?: No - Patient declined  Allergies  Allergen Reactions  . Lisinopril Shortness Of Breath    Tracheal Edema  . Penicillins Other (See Comments)    DID THE REACTION INVOLVE: Swelling of the face/tongue/throat, SOB, or low BP? Yes Sudden or severe rash/hives, skin peeling, or the inside of the mouth or nose? No Did it require medical treatment? No When did it last happen?more than 10 years  If all above answers are "NO", may proceed with cephalosporin use.   Throat felt tight    Chief Complaint  Patient presents with  . Medical Management of Chronic Issues    3 month follow-up. Patient c/o redness, fluid filled and itchy area on right shoulder.   . Best Practice Recommendations    Discuss need for MALB   . Immunizations    Discuss need for Prevnar and Shingrix      HPI: Patient is a 85 y.o. female seen in the office today for routine follow up. Feels like she is doing well. Reports increase in red itchy spot on her shoulder that will form a blister has happened 4 times. Will last 3 days. Not painful, just itches. No current spots now.   hyperlipidemia not at goal, LDL 116 on crestor 20 mg daily, reports compliance in medications. No changes in diet.   CHF-without weight gain, swelling, shortness of breath or chest pains. Continues on lasix 20 mg daily.   OAB-ongoing, changing pad frequently. Not worried about this during the day, more so at night.   DM- on levemir 24 units  daily with tradjenta, Blood sugars are staying from 80-120, occasionally 140s  CKD- stable renal function on last labs, reports she is limited to 2L a day but does not think she gets this. avoiding NSAIDs.   Elevated calcium on recent labs, stopped calcium supplement and cholesterol medication.   Osteoporosis- continue on fosamax (been on for about 2 years)  No side effects   Review of Systems:  Review of Systems  Constitutional: Negative for chills, fever and weight loss.  Respiratory: Negative for cough and sputum production.   Cardiovascular: Negative for chest pain and palpitations.  Gastrointestinal: Negative for abdominal pain, constipation, diarrhea, heartburn, nausea and vomiting.  Genitourinary: Positive for frequency. Negative for dysuria, flank pain, hematuria and urgency.  Skin: Negative for itching and rash.  Neurological: Negative for dizziness, weakness and headaches.  Endo/Heme/Allergies: Negative for polydipsia.   Past Medical History:  Diagnosis Date  . Arthritis    bilateral knees  . Chronic diastolic CHF (congestive heart failure) ([place])   . CKD (chronic kidney disease), stage III ([place])   . Diabetes mellitus without complication ([place])   . Edema   . Hyperlipidemia   . Hypertension   . Hypothyroidism   . Pulmonary embolism ([place])   . Urinary incontinence    Past Surgical History:  Procedure Laterality Date  . BLADDER SURGERY  2000   Dr.Tannerbaum (Bladder Tact)  . CATARACT EXTRACTION  03/2013   Dr.Shapiro  . CHOLECYSTECTOMY    . EYE SURGERY  bilateral cataracts  . [facility]   . right elbow  1999   x 2, still has one piece of metal in it  . TONSILLECTOMY AND ADENOIDECTOMY  1937   Dr.Brewer   Social History:   reports that she has never smoked. She has never used smokeless tobacco. She reports that she does not drink alcohol or use drugs.  Family History  Problem Relation Age of Onset  . Suicidality Father    . Cancer Brother   . Cancer Maternal Grandfather   . Diabetes Maternal Grandmother   . Melanoma Brother     Medications: Patient's Medications  New Prescriptions   No medications on file  Previous Medications   ALENDRONATE (FOSAMAX) 70 MG TABLET    TAKE 1 TABLET BY MOUTH ONCE WEEKLY TAKE WITH FULL GLASS OF WATER ON AN EMPTY STOMACH   AMLODIPINE (NORVASC) 5 MG TABLET    TAKE 1 TABLET BY MOUTH EVERY DAY   APIXABAN (ELIQUIS) 5 MG TABS TABLET    Take 2.5 mg by mouth 2 (two) times daily. Takes 1/2 tab   BD PEN NEEDLE NANO U/F 32G X 4 MM MISC    USE AS DIRECTED ONCE DAILY   CALCIUM CARBONATE-VITAMIN D (CALTRATE 600+D) 600-400 MG-UNIT TABLET    Take 1 tablet by mouth 2 (two) times daily.   CHOLECALCIFEROL (VITAMIN D3) 5000 UNITS CHEW    Chew 5,000 Units by mouth daily.    DONEPEZIL (ARICEPT) 10 MG TABLET    TAKE ONE TABLET BY MOUTH ONCE DAILY AT BEDTIME TO PRESERVE MEMORY   FUROSEMIDE (LASIX) 20 MG TABLET    Take 1 tablet (20 mg total) by mouth daily.   LEVEMIR FLEXTOUCH 100 UNIT/ML PEN    INJECT 24 UNITS INTO THE SKIN DAILY.   LEVOTHYROXINE (SYNTHROID, LEVOTHROID) 100 MCG TABLET    TAKE 1 TABLET BY MOUTH EVERY DAY BEFORE BREAKFAST   MULTIPLE VITAMIN (MULTIVITAMIN WITH MINERALS) TABS TABLET    Take 1 tablet by mouth daily.   NUTRITIONAL SUPPLEMENTS (GLUCERNA SHAKE PO)    Take by mouth.   ONE TOUCH ULTRA TEST TEST STRIP    Use daily to test blood sugar. Dx: I94.85   ONETOUCH DELICA LANCETS 46E MISC    USE EVERY OTHER DAY TO TEST BLOOD SUGAR   ROSUVASTATIN (CRESTOR) 20 MG TABLET    TAKE 1 TABLET EVERY DAY FOR CHOLESTEROL   TRADJENTA 5 MG TABS TABLET    TAKE 1 TABLET BY MOUTH EVERY DAY  Modified Medications   No medications on file  Discontinued Medications   No medications on file     Physical Exam:  Vitals:   12/31/18 0933  BP: 134/80  Pulse: 88  Temp: 98.1 F (36.7 C)  TempSrc: Oral  SpO2: 96%  Weight: 218 lb (98.9 kg)  Height: 5\' 8"  (1.727 m)   Body mass index is 33.15  kg/m.  Physical Exam Constitutional:      General: She is not in acute distress.    Appearance: She is well-developed. She is not diaphoretic.  HENT:     Head: Normocephalic and atraumatic.  Eyes:     Conjunctiva/sclera: Conjunctivae normal.     Pupils: Pupils are equal, round, and reactive to light.  Neck:     Musculoskeletal: Normal range of motion and neck supple.  Cardiovascular:     Rate and Rhythm: Normal rate and regular rhythm.     Heart sounds: Murmur present.  Pulmonary:  Effort: Pulmonary effort is normal.     Breath sounds: Normal breath sounds.  Abdominal:     General: Bowel sounds are normal.     Palpations: Abdomen is soft.  Musculoskeletal:        General: No tenderness.     Right lower leg: No edema.     Left lower leg: No edema.  Skin:    General: Skin is warm and dry.  Neurological:     Mental Status: She is alert and oriented to person, place, and time.     Gait: Gait abnormal (uses walker).     Labs reviewed: Basic Metabolic Panel: Recent Labs    02/23/18 1120  09/14/18 1516 09/20/18 1410 12/24/18 0925  NA  --    < > 140 140 141  K  --    < > 5.1 4.1 4.1  CL  --    < > 100 103 104  CO2  --    < > 32 26 31  GLUCOSE  --    < > 310* 172* 155*  BUN  --    < > 37* 27* 25  CREATININE  --    < > 1.64* 1.36* 1.28*  CALCIUM  --    < > 10.8* 9.8 11.2*  TSH 0.63  --   --   --  0.47   < > = values in this interval not displayed.   Liver Function Tests: Recent Labs    08/30/18 0224 09/03/18 1355 12/24/18 0925  AST 28 50* 17  ALT 20 50* 15  ALKPHOS 40 45  --   BILITOT 0.7 0.8 0.5  PROT 6.3* 6.7 6.1  ALBUMIN 3.5 3.6  --    No results for input(s): LIPASE, AMYLASE in the last 8760 hours. No results for input(s): AMMONIA in the last 8760 hours. CBC: Recent Labs    08/30/18 0224 08/31/18 0508 09/03/18 1355 12/24/18 0925  WBC 8.1 9.8 10.8* 7.4  NEUTROABS 6.4  --  7.8* 3,781  HGB 13.5 12.4 15.0 14.7  HCT 43.5 40.1 48.0* 44.6  MCV  93.1 92.8 92.7 89.9  PLT 131* 141* 192 203   Lipid Panel: Recent Labs    02/23/18 1120 12/24/18 0925  CHOL 166 217*  HDL 64 65  LDLCALC 77 116*  TRIG 151* 239*  CHOLHDL 2.6 3.3   TSH: Recent Labs    02/23/18 1120 12/24/18 0925  TSH 0.63 0.47   A1C: Lab Results  Component Value Date   HGBA1C 6.3 (H) 12/24/2018     Assessment/Plan 1. Chronic congestive heart failure, unspecified heart failure type ([place]) Stable euvolemic at this time. Continues to follow up with cardiology/heart failure clinic. Will continue lasix at this time.   2. Type 2 diabetes mellitus with stage 3 chronic kidney disease, with long-term current use of insulin (HCC) A1c at goal. No hypoglycemic episodes. Will continue current regimen. Encouraged dietary compliance, routine foot care/monitoring and to keep up with diabetic eye exams through ophthalmology  - Microalbumin, urine - Hemoglobin A1c; Future - COMPLETE METABOLIC PANEL WITH GFR; Future  3. Hyperlipidemia associated with type 2 diabetes mellitus ([place]) Not at goal. States she has not changed diet and has been compliant with medications. Will increase crestor to 40 mg by mouth daily at this time and to continue to work on dietary modifications.  - rosuvastatin (CRESTOR) 40 MG tablet; TAKE 1 TABLET EVERY DAY FOR CHOLESTEROL  Dispense: 90 tablet; Refill: 1 - Lipid Panel; Future - COMPLETE  METABOLIC PANEL WITH GFR; Future  4. Hypothyroidism due to acquired atrophy of thyroid TSH stable, continues on synthroid 100 mcg daily   5. Stage 3 chronic kidney disease ([place]) Stable on recent labs. Encourage proper hydration and to avoid NSAIDS (Aleve, Advil, Motrin, Ibuprofen)  - Microalbumin, urine  6. History of pulmonary embolus ([place]) Continues on eliquis,   7. Osteoporosis, unspecified osteoporosis type, unspecified pathological fracture presence -stopping calcium due to elevated calicum level - DG Bone Density; Future  8. Estrogen deficiency -  DG Bone Density; Future  9. Serum calcium elevated -to stop supplemental calcium and will follow up lab.  - COMPLETE METABOLIC PANEL WITH GFR; Future  Next appt: 02/26/2019 Carlos American. [place], [facility] [phone]

## 2019-01-01 ENCOUNTER — Encounter: Payer: Self-pay | Admitting: Family

## 2019-01-01 ENCOUNTER — Ambulatory Visit (INDEPENDENT_AMBULATORY_CARE_PROVIDER_SITE_OTHER): Payer: Medicare Other | Admitting: Family

## 2019-01-01 VITALS — BP 130/80 | HR 94 | Temp 97.8°F | Ht 68.0 in | Wt 219.0 lb

## 2019-01-01 DIAGNOSIS — R21 Rash and other nonspecific skin eruption: Secondary | ICD-10-CM | POA: Diagnosis not present

## 2019-01-01 MED ORDER — HYDROCORTISONE 0.5 % EX CREA: 1.0000 "application " | TOPICAL_CREAM | Freq: Two times a day (BID) | CUTANEOUS | 0 refills | Status: AC

## 2019-01-01 NOTE — Patient Instructions (Signed)
Rash, Adult    A rash is a change in the color of your skin. A rash can also change the way your skin feels. There are many different conditions and factors that can cause a rash.  Follow these instructions at home:  The goal of treatment is to stop the itching and keep the rash from spreading. Watch for any changes in your symptoms. Let your doctor know about them. Follow these instructions to help with your condition:  Medicine  Take or apply over-the-counter and prescription medicines only as told by your doctor. These may include medicines:   To treat red or swollen skin (corticosteroid creams).   To treat itching.   To treat an allergy (oral antihistamines).   To treat very bad symptoms (oral corticosteroids).    Skin care   Put cool cloths (compresses) on the affected areas.   Do not scratch or rub your skin.   Avoid covering the rash. Make sure that the rash is exposed to air as much as possible.  Managing itching and discomfort   Avoid hot showers or baths. These can make itching worse. A cold shower may help.   Try taking a bath with:  ? Epsom salts. You can get these at your local pharmacy or grocery store. Follow the instructions on the package.  ? Baking soda. Pour a small amount into the bath as told by your doctor.  ? Colloidal oatmeal. You can get this at your local pharmacy or grocery store. Follow the instructions on the package.   Try putting baking soda paste onto your skin. Stir water into baking soda until it gets like a paste.   Try putting on a lotion that relieves itchiness (calamine lotion).   Keep cool and out of the sun. Sweating and being hot can make itching worse.  General instructions     Rest as needed.   Drink enough fluid to keep your pee (urine) pale yellow.   Wear loose-fitting clothing.   Avoid scented soaps, detergents, and perfumes. Use gentle soaps, detergents, perfumes, and other cosmetic products.   Avoid anything that causes your rash. Keep a journal to  help track what causes your rash. Write down:  ? What you eat.  ? What cosmetic products you use.  ? What you drink.  ? What you wear. This includes jewelry.   Keep all follow-up visits as told by your doctor. This is important.  Contact a doctor if:   You sweat at night.   You lose weight.   You pee (urinate) more than normal.   You pee less than normal, or you notice that your pee is a darker color than normal.   You feel weak.   You throw up (vomit).   Your skin or the whites of your eyes look yellow (jaundice).   Your skin:  ? Tingles.  ? Is numb.   Your rash:  ? Does not go away after a few days.  ? Gets worse.   You are:  ? More thirsty than normal.  ? More tired than normal.   You have:  ? New symptoms.  ? Pain in your belly (abdomen).  ? A fever.  ? Watery poop (diarrhea).  Get help right away if:   You have a fever and your symptoms suddenly get worse.   You start to feel mixed up (confused).   You have a very bad headache or a stiff neck.   You have very bad joint pains   or stiffness.   You have jerky movements that you cannot control (seizure).   Your rash covers all or most of your body. The rash may or may not be painful.   You have blisters that:  ? Are on top of the rash.  ? Grow larger.  ? Grow together.  ? Are painful.  ? Are inside your nose or mouth.   You have a rash that:  ? Looks like purple pinprick-sized spots all over your body.  ? Has a "bull's eye" or looks like a target.  ? Is red and painful, causes your skin to peel, and is not from being in the sun too long.  Summary   A rash is a change in the color of your skin. A rash can also change the way your skin feels.   The goal of treatment is to stop the itching and keep the rash from spreading.   Take or apply over-the-counter and prescription medicines only as told by your doctor.   Contact a doctor if you have new symptoms or symptoms that get worse.   Keep all follow-up visits as told by your doctor. This is  important.  This information is not intended to replace advice given to you by your health care provider. Make sure you discuss any questions you have with your health care provider.  Document Released: 01/18/2008 Document Revised: 03/05/2018 Document Reviewed: 03/05/2018  Elsevier Interactive Patient Education  2019 Elsevier Inc[employer]

## 2019-01-01 NOTE — Progress Notes (Signed)
Provider: Dinah Ngetich FNP-C  Lauree Chandler, NP  Patient Care Team: Lauree Chandler, NP as PCP - General (Nurse Practitioner) Larey Dresser, MD as PCP - Advanced Heart Failure (Cardiology) Tanda Rockers, MD as Consulting Physician (Pulmonary Disease) Larey Dresser, MD as Consulting Physician (Cardiology)  Extended Emergency Contact Information Primary Emergency Contact: [place] Phone: [phone] Mobile Phone: [phone] Relation: Daughter Secondary Emergency Contact: [facility] Address: [place], [place] [address] [name] of [place] Phone: [phone] Mobile Phone: [phone] Relation: Son   Goals of care: Advanced Directive information Advanced Directives 12/31/2018  Does Patient Have a Medical Advance Directive? Yes  Type of Paramedic of [place];Living will  Does patient want to make changes to medical advance directive? No - Patient declined  Copy of [place] in Chart? Yes - validated most recent copy scanned in chart (See row information)  Would patient like information on creating a medical advance directive? -  Pre-existing out of facility DNR order (yellow form or pink MOST form) -     Chief Complaint  Patient presents with  . Acute Visit    Blisters on arms and hands patient states this has been going on for a couple months     HPI:  Pt is a 85 y.o. female seen today for an acute visit for evaluation of rash.she states rash has been coming on and off for the past 3-4 weeks.she states had similar skin redness on upper arm and right upper chest but resolved.she used Vaseline with some relief.she noticed rash yesterday after her visit with provider.she describe rash as itchy but not painful.she denies any recent change in soap,lotion,detergent or insect bite.No drainage,fever or chills reported.   Past Medical History:  Diagnosis  Date  . Arthritis    bilateral knees  . Chronic diastolic CHF (congestive heart failure) ([place])   . CKD (chronic kidney disease), stage III ([place])   . Diabetes mellitus without complication ([place])   . Edema   . Hyperlipidemia   . Hypertension   . Hypothyroidism   . Pulmonary embolism ([place])   . Urinary incontinence    Past Surgical History:  Procedure Laterality Date  . BLADDER SURGERY  2000   Dr.Tannerbaum (Bladder Tact)  . CATARACT EXTRACTION  03/2013   Dr.Shapiro  . CHOLECYSTECTOMY    . EYE SURGERY     bilateral cataracts  . [facility]   . right elbow  1999   x 2, still has one piece of metal in it  . TONSILLECTOMY AND ADENOIDECTOMY  1937   Dr.Brewer    Allergies  Allergen Reactions  . Lisinopril Shortness Of Breath    Tracheal Edema  . Penicillins Other (See Comments)    DID THE REACTION INVOLVE: Swelling of the face/tongue/throat, SOB, or low BP? Yes Sudden or severe rash/hives, skin peeling, or the inside of the mouth or nose? No Did it require medical treatment? No When did it last happen?more than 10 years  If all above answers are "NO", may proceed with cephalosporin use.   Throat felt tight    Outpatient Encounter Medications as of 01/01/2019  Medication Sig  . alendronate (FOSAMAX) 70 MG tablet TAKE 1 TABLET BY MOUTH ONCE WEEKLY TAKE WITH FULL GLASS OF WATER ON AN EMPTY STOMACH  . amLODipine (NORVASC) 5 MG tablet TAKE 1  TABLET BY MOUTH EVERY DAY  . apixaban (ELIQUIS) 5 MG TABS tablet Take 2.5 mg by mouth 2 (two) times daily. Takes 1/2 tab  . BD PEN NEEDLE NANO U/F 32G X 4 MM MISC USE AS DIRECTED ONCE DAILY  . Cholecalciferol (VITAMIN D3) 5000 UNITS CHEW Chew 5,000 Units by mouth daily.   Marland Kitchen donepezil (ARICEPT) 10 MG tablet TAKE ONE TABLET BY MOUTH ONCE DAILY AT BEDTIME TO PRESERVE MEMORY  . furosemide (LASIX) 20 MG tablet Take 1 tablet (20 mg total) by mouth daily.  Marland Kitchen LEVEMIR FLEXTOUCH 100 UNIT/ML Pen INJECT 24 UNITS  INTO THE SKIN DAILY.  Marland Kitchen levothyroxine (SYNTHROID, LEVOTHROID) 100 MCG tablet TAKE 1 TABLET BY MOUTH EVERY DAY BEFORE BREAKFAST  . Multiple Vitamin (MULTIVITAMIN WITH MINERALS) TABS tablet Take 1 tablet by mouth daily.  . Nutritional Supplements (GLUCERNA SHAKE PO) Take by mouth.  . ONE TOUCH ULTRA TEST test strip Use daily to test blood sugar. Dx: O75.64  Glory Rosebush DELICA LANCETS 33I MISC USE EVERY OTHER DAY TO TEST BLOOD SUGAR  . rosuvastatin (CRESTOR) 40 MG tablet TAKE 1 TABLET EVERY DAY FOR CHOLESTEROL  . TRADJENTA 5 MG TABS tablet TAKE 1 TABLET BY MOUTH EVERY DAY  . hydrocortisone cream 0.5 % Apply 1 application topically 2 (two) times daily for 14 days. Apply to affected areas on right forearm   No facility-administered encounter medications on file as of 01/01/2019.     Review of Systems  Constitutional: Negative for appetite change, chills, fatigue and fever.  HENT: Negative for rhinorrhea, sinus pressure, sinus pain, sneezing and sore throat.   Respiratory: Negative for cough, chest tightness, shortness of breath and wheezing.   Cardiovascular: Negative for chest pain, palpitations and leg swelling.  Gastrointestinal: Negative for nausea and vomiting.  Musculoskeletal: Positive for gait problem. Negative for myalgias.  Skin: Positive for rash. Negative for pallor and wound.  Neurological: Negative for dizziness, light-headedness and headaches.    Immunization History  Administered Date(s) Administered  . Pneumococcal Polysaccharide-23 04/06/2009  . Td 04/06/2009   Pertinent  Health Maintenance Due  Topic Date Due  . URINE MICROALBUMIN  09/28/2018  . PNA vac Low Risk Adult (2 of 2 - PCV13) 05/29/2019 (Originally 04/06/2010)  . FOOT EXAM  02/06/2019  . INFLUENZA VACCINE  03/16/2019  . HEMOGLOBIN A1C  06/26/2019  . OPHTHALMOLOGY EXAM  07/03/2019  . DEXA SCAN  Completed   Fall Risk  01/01/2019 12/31/2018 09/14/2018 09/06/2018 05/28/2018  Falls in the past year? 0 0 0 0 No   Number falls in past yr: 0 0 0 0 -  Injury with Fall? 0 0 0 0 -    Vitals:   01/01/19 1029  BP: 130/80  Pulse: 94  Temp: 97.8 F (36.6 C)  TempSrc: Oral  SpO2: 95%  Weight: 219 lb (99.3 kg)  Height: 5\' 8"  (1.727 m)   Body mass index is 33.3 kg/m. Physical Exam Vitals signs reviewed.  Constitutional:      General: She is not in acute distress.    Appearance: She is obese. She is not ill-appearing.  HENT:     Mouth/Throat:     Mouth: Mucous membranes are moist.     Pharynx: Oropharynx is clear. No oropharyngeal exudate or posterior oropharyngeal erythema.  Eyes:     General: No scleral icterus.       Right eye: No discharge.        Left eye: No discharge.     Conjunctiva/sclera: Conjunctivae normal.  Pupils: Pupils are equal, round, and reactive to light.  Cardiovascular:     Rate and Rhythm: Normal rate and regular rhythm.     Pulses: Normal pulses.     Heart sounds: Normal heart sounds. No murmur. No friction rub. No gallop.   Pulmonary:     Effort: Pulmonary effort is normal. No respiratory distress.     Breath sounds: Normal breath sounds. No wheezing, rhonchi or rales.  Chest:     Chest wall: No tenderness.  Musculoskeletal: Normal range of motion.        General: No swelling or tenderness.     Comments: Unsteady gait uses front wheel walker   Skin:    General: Skin is warm and dry.     Coloration: Skin is not pale.     Comments: Round area red non-raised rash on right forearm and index finger.No vesicles noted.   Neurological:     Mental Status: She is alert.    Labs reviewed: Recent Labs    09/14/18 1516 09/20/18 1410 12/24/18 0925  NA 140 140 141  K 5.1 4.1 4.1  CL 100 103 104  CO2 32 26 31  GLUCOSE 310* 172* 155*  BUN 37* 27* 25  CREATININE 1.64* 1.36* 1.28*  CALCIUM 10.8* 9.8 11.2*   Recent Labs    08/30/18 0224 09/03/18 1355 12/24/18 0925  AST 28 50* 17  ALT 20 50* 15  ALKPHOS 40 45  --   BILITOT 0.7 0.8 0.5  PROT 6.3* 6.7 6.1   ALBUMIN 3.5 3.6  --    Recent Labs    08/30/18 0224 08/31/18 0508 09/03/18 1355 12/24/18 0925  WBC 8.1 9.8 10.8* 7.4  NEUTROABS 6.4  --  7.8* 3,781  HGB 13.5 12.4 15.0 14.7  HCT 43.5 40.1 48.0* 44.6  MCV 93.1 92.8 92.7 89.9  PLT 131* 141* 192 203   Lab Results  Component Value Date   TSH 0.47 12/24/2018   Lab Results  Component Value Date   HGBA1C 6.3 (H) 12/24/2018   Lab Results  Component Value Date   CHOL 217 (H) 12/24/2018   HDL 65 12/24/2018   LDLCALC 116 (H) 12/24/2018   TRIG 239 (H) 12/24/2018   CHOLHDL 3.3 12/24/2018    Significant Diagnostic Results in last 30 days:  No results found.  Assessment/Plan  Rash and nonspecific skin eruption Afebrile.right forearm wrist area and right index finger red round,non-raised rash.Non-tender to touch.moves index finger without any difficulties.unclear etiology. Apply Hydrocortisone 1% cream to affected areas twice daily x 14 days. Notify provider if running fever,symptoms worsen or not resolved.   Family/ staff Communication: Reviewed plan of care with patient.  Labs/tests ordered: None   Dinah C Ngetich, NP

## 2019-01-02 ENCOUNTER — Other Ambulatory Visit: Payer: Self-pay | Admitting: Nurse Practitioner

## 2019-01-02 LAB — MICROALBUMIN, URINE: Microalb, Ur: 2 mg/dL

## 2019-01-13 ENCOUNTER — Other Ambulatory Visit: Payer: Self-pay | Admitting: Nurse Practitioner

## 2019-01-17 ENCOUNTER — Telehealth (HOSPITAL_COMMUNITY): Payer: Self-pay | Admitting: Cardiology

## 2019-01-17 NOTE — Telephone Encounter (Signed)
Called and left pt message that we are cancelling her appt for 01/21/2019 with Dr. Aundra Dubin and adding her to his waitlist.  Advised pt if she is having any problems at this time, to call main clinic and choose option 2 to speak with Nurse.  Also asked pt to call back to confirm she received our messsage.

## 2019-01-21 ENCOUNTER — Encounter (HOSPITAL_COMMUNITY): Payer: Medicare Other | Admitting: Cardiology

## 2019-01-31 ENCOUNTER — Other Ambulatory Visit: Payer: Self-pay | Admitting: Nurse Practitioner

## 2019-02-25 ENCOUNTER — Encounter: Payer: Self-pay | Admitting: Family

## 2019-02-25 ENCOUNTER — Ambulatory Visit: Payer: Self-pay

## 2019-02-26 ENCOUNTER — Encounter: Payer: Medicare Other | Admitting: Family

## 2019-02-27 ENCOUNTER — Other Ambulatory Visit: Payer: Self-pay

## 2019-02-27 ENCOUNTER — Ambulatory Visit (INDEPENDENT_AMBULATORY_CARE_PROVIDER_SITE_OTHER): Payer: Medicare Other | Admitting: Family

## 2019-02-27 ENCOUNTER — Encounter: Payer: Self-pay | Admitting: Family

## 2019-02-27 VITALS — BP 130/80 | HR 84 | Temp 98.7°F | Ht 68.0 in | Wt 216.6 lb

## 2019-02-27 DIAGNOSIS — Z0000 Encounter for general adult medical examination without abnormal findings: Secondary | ICD-10-CM | POA: Diagnosis not present

## 2019-02-27 NOTE — Progress Notes (Signed)
Subjective:   Melissa Shannon is a 85 y.o. female who presents for Medicare Annual (Subsequent) preventive examination.  Review of Systems:  Cardiac Risk Factors include: advanced age (>50men, >32 women);diabetes mellitus;dyslipidemia;obesity (BMI >30kg/m2);hypertension     Objective:     Vitals: BP 130/80   Pulse 84   Temp 98.7 F (37.1 C) (Oral)   Ht 5\' 8"  (1.727 m)   Wt 216 lb 9.6 oz (98.2 kg)   SpO2 98%   BMI 32.93 kg/m   Body mass index is 32.93 kg/m.  Advanced Directives 02/27/2019 12/31/2018 10/01/2018 09/03/2018 09/03/2018 08/30/2018 08/29/2018  Does Patient Have a Medical Advance Directive? Yes Yes No Yes Yes Yes Yes  Type of Advance Directive Out of facility DNR (pink MOST or yellow form);Living will;Healthcare Power of [place];Living will - Out of facility DNR (pink MOST or yellow form) Out of facility DNR (pink MOST or yellow form) Living will Living will  Does patient want to make changes to medical advance directive? - No - Patient declined No - Patient declined - No - Patient declined No - Patient declined -  Copy of [place] in Chart? Yes - validated most recent copy scanned in chart (See row information) Yes - validated most recent copy scanned in chart (See row information) - - - - -  Would patient like information on creating a medical advance directive? - - - - - - -  Pre-existing out of facility DNR order (yellow form or pink MOST form) - - - - - - -    Tobacco Social History   Tobacco Use  Smoking Status Never Smoker  Smokeless Tobacco Never Used     Counseling given: Not Answered   Clinical Intake:  Pre-visit preparation completed: No  Pain : No/denies pain     BMI - recorded: 32.93 Nutritional Status: BMI > 30  Obese Nutritional Risks: None Diabetes: Yes CBG done?: No Did pt. bring in CBG monitor from home?: No(fluctuates in the 100's)  How often do you need to have someone help you when you  read instructions, pamphlets, or other written materials from your doctor or pharmacy?: 1 - Never What is the last grade level you completed in school?: college 4 yrs  Interpreter Needed?: No  Information entered by :: Dinah Ngetich FNP-C  Past Medical History:  Diagnosis Date  . Arthritis    bilateral knees  . Chronic diastolic CHF (congestive heart failure) ([place])   . CKD (chronic kidney disease), stage III ([place])   . Diabetes mellitus without complication ([place])   . Edema   . Hyperlipidemia   . Hypertension   . Hypothyroidism   . Pulmonary embolism ([place])   . Urinary incontinence    Past Surgical History:  Procedure Laterality Date  . BLADDER SURGERY  2000   Dr.Tannerbaum (Bladder Tact)  . CATARACT EXTRACTION  03/2013   Dr.Shapiro  . CHOLECYSTECTOMY    . EYE SURGERY     bilateral cataracts  . [facility]   . right elbow  1999   x 2, still has one piece of metal in it  . TONSILLECTOMY AND ADENOIDECTOMY  1937   Dr.Brewer   Family History  Problem Relation Age of Onset  . Suicidality Father   . Cancer Brother   . Cancer Maternal Grandfather   . Diabetes Maternal Grandmother   . Melanoma Brother    Social History   Socioeconomic  History  . Marital status: Widowed    Spouse name: Not on file  . Number of children: Not on file  . Years of education: Not on file  . Highest education level: Not on file  Occupational History  . Occupation: Retired Tour manager  . Financial resource strain: Not hard at all  . Food insecurity    Worry: Never true    Inability: Never true  . Transportation needs    Medical: No    Non-medical: No  Tobacco Use  . Smoking status: Never Smoker  . Smokeless tobacco: Never Used  Substance and Sexual Activity  . Alcohol use: No  . Drug use: No  . Sexual activity: Never  Lifestyle  . Physical activity    Days per week: 7 days    Minutes per session: 20 min  . Stress: Not at all   Relationships  . Social Herbalist on phone: Three times a week    Gets together: Once a week    Attends religious service: Never    Active member of club or organization: No    Attends meetings of clubs or organizations: Never    Relationship status: Widowed  Other Topics Concern  . Not on file  Social History Narrative   Diet: Low sodium    Do you drink/eat things with caffeine? Yes, tea   Widowed, married 1952   Lives in a house, yes- 1 or more stories, 1 person in home. No pets    Current/past profession- Teacher    Patient exercises with walker twice daily         Outpatient Encounter Medications as of 02/27/2019  Medication Sig  . alendronate (FOSAMAX) 70 MG tablet TAKE 1 TABLET BY MOUTH ONCE WEEKLY TAKE WITH FULL GLASS OF WATER ON AN EMPTY STOMACH  . amLODipine (NORVASC) 5 MG tablet TAKE 1 TABLET BY MOUTH EVERY DAY  . BD PEN NEEDLE NANO U/F 32G X 4 MM MISC USE AS DIRECTED ONCE DAILY  . Cholecalciferol (VITAMIN D3) 5000 UNITS CHEW Chew 5,000 Units by mouth daily.   Marland Kitchen donepezil (ARICEPT) 10 MG tablet TAKE ONE TABLET BY MOUTH ONCE DAILY AT BEDTIME TO PRESERVE MEMORY  . ELIQUIS 5 MG TABS tablet TAKE 1 TABLET BY MOUTH TWICE A DAY  . furosemide (LASIX) 20 MG tablet Take 1 tablet (20 mg total) by mouth daily.  Marland Kitchen LEVEMIR FLEXTOUCH 100 UNIT/ML Pen INJECT 24 UNITS INTO THE SKIN DAILY.  Marland Kitchen levothyroxine (SYNTHROID, LEVOTHROID) 100 MCG tablet TAKE 1 TABLET BY MOUTH EVERY DAY BEFORE BREAKFAST  . Multiple Vitamin (MULTIVITAMIN WITH MINERALS) TABS tablet Take 1 tablet by mouth daily.  . Nutritional Supplements (GLUCERNA SHAKE PO) Take by mouth.  . ONE TOUCH ULTRA TEST test strip Use daily to test blood sugar. Dx: P23.30  Glory Rosebush DELICA LANCETS 07M MISC USE EVERY OTHER DAY TO TEST BLOOD SUGAR  . rosuvastatin (CRESTOR) 40 MG tablet TAKE 1 TABLET EVERY DAY FOR CHOLESTEROL  . TRADJENTA 5 MG TABS tablet TAKE 1 TABLET BY MOUTH EVERY DAY   No facility-administered encounter  medications on file as of 02/27/2019.     Activities of Daily Living In your present state of health, do you have any difficulty performing the following activities: 02/27/2019 08/30/2018  Hearing? Y N  Vision? N N  Difficulty concentrating or making decisions? Y Y  Comment [place] -  Walking or climbing stairs? Y Y  Comment ambulates with walker secondary to weakness  Dressing or bathing? Y N  Comment has assistance -  Doing errands, shopping? N N  Preparing Food and eating ? [place] giver assist at times -  Using the Toilet? N -  In the past six months, have you accidently leaked urine? N -  Do you have problems with loss of bowel control? N -  Managing your Medications? N -  Managing your Finances? N -  Housekeeping or managing your Housekeeping? Y -  Comment has a care giver who assist -  Some recent data might be hidden    Patient Care Team: Lauree Chandler, NP as PCP - General (Nurse Practitioner) Larey Dresser, MD as PCP - Advanced Heart Failure (Cardiology) Tanda Rockers, MD as Consulting Physician (Pulmonary Disease) Larey Dresser, MD as Consulting Physician (Cardiology)    Assessment:   This is a routine wellness examination for Reader.  Exercise Activities and Dietary recommendations Current Exercise Habits: Home exercise routine, Type of exercise: Other - see comments;walking(leg exericses), Time (Minutes): 15, Frequency (Times/Week): 6, Weekly Exercise (Minutes/Week): 90, Intensity: Mild, Exercise limited by: Other - see comments(unsteady gait)  Goals    . Maintain Lifestyle     Patient will continue to watch salt intake, water intake, and walking       Fall Risk Fall Risk  02/27/2019 01/01/2019 12/31/2018 09/14/2018 09/06/2018  Falls in the past year? 0 0 0 0 0  Number falls in past yr: 0 0 0 0 0  Injury with Fall? 0 0 0 0 0   Is the patient's home free of loose throw rugs in walkways, pet beds, electrical cords, etc?   no      Grab bars  in the bathroom? no      Handrails on the stairs?   no Has a chair lifts.       Adequate lighting?   yes  Depression Screen PHQ 2/9 Scores 02/27/2019 02/23/2018 02/23/2018 02/20/2017  PHQ - 2 Score 0 0 0 0     Cognitive Function MMSE - Mini Mental State Exam 02/27/2019 02/23/2018 02/17/2017 02/11/2016 09/04/2014  Orientation to time 5 5 5 5 5   Orientation to Place 5 3 4 5 5   Registration 3 3 3 3 3   Attention/ Calculation 5 5 5 5 5   Recall 3 2 1 2 3   Language- name 2 objects 2 2 2 2 2   Language- repeat 1 1 1 1 1   Language- follow 3 step command 3 3 2 3 3   Language- read & follow direction 1 1 1 1 1   Write a sentence 1 1 1 1 1   Copy design 1 1 1 1 1   Total score 30 27 26 29 30         Immunization History  Administered Date(s) Administered  . Pneumococcal Polysaccharide-23 04/06/2009  . Td 04/06/2009    Qualifies for Shingles Vaccine? Declined   Screening Tests Health Maintenance  Topic Date Due  . FOOT EXAM  02/06/2019  . PNA vac Low Risk Adult (2 of 2 - PCV13) 05/29/2019 (Originally 04/06/2010)  . INFLUENZA VACCINE  03/16/2019  . TETANUS/TDAP  04/07/2019  . HEMOGLOBIN A1C  06/26/2019  . OPHTHALMOLOGY EXAM  07/03/2019  . URINE MICROALBUMIN  01/01/2020  . DEXA SCAN  Completed    Cancer Screenings: Lung: Low Dose CT Chest recommended if Age 64-80 years, 30 pack-year currently smoking OR have quit w/in 15years. Patient does not qualify. Breast:  Up to date on Mammogram? No  N/A  Up to date of Bone Density/Dexa? No has upcoming appointment for Dexa scan  Colorectal:Up to date   Additional Screenings: Hepatitis C Screening: Low risk   Plan:  - due for annual foot exam with Podiatrist but would like to wait until COVId-19 restrictions are over.  - Has upcoming appointment with Dr. Gershon [facility] in [employer] [employer]   I have personally reviewed and noted the following in the patient's chart:   . Medical and social history . Use of alcohol, tobacco or illicit drugs  .  Current medications and supplements . Functional ability and status . Nutritional status . Physical activity . Advanced directives . List of other physicians . Hospitalizations, surgeries, and ER visits in previous 12 months . Vitals . Screenings to include cognitive, depression, and falls . Referrals and appointments  In addition, I have reviewed and discussed with patient certain preventive protocols, quality metrics, and best practice recommendations. A written personalized care plan for preventive services as well as general preventive health recommendations were provided to patient.     Sandrea Hughs, NP  02/27/2019

## 2019-02-27 NOTE — Patient Instructions (Signed)
Melissa Shannon , Thank you for taking time to come for your Medicare Wellness Visit. I appreciate your ongoing commitment to your health goals. Please review the following plan we discussed and let me know if I can assist you in the future.   Screening recommendations/referrals: Colonoscopy: Up to date  Mammogram: N/A  Bone Density : Up coming appointment  Recommended yearly ophthalmology/optometry visit for glaucoma screening and checkup Recommended yearly dental visit for hygiene and checkup  Vaccinations: Influenza vaccine: Decline  Pneumococcal vaccine: Decline  Tdap vaccine: Up to date due 03/2019  Shingles vaccine: Decline    Advanced directives: Yes   Conditions/risks identified: Advance age female > 11 yrs,Hypertension,hyperlidemia,Obesity,Type 2 Diabetes  Next appointment: 1 years    Preventive Care 67 Years and Older, Female Preventive care refers to lifestyle choices and visits with your health care provider that can promote health and wellness. What does preventive care include?  A yearly physical exam. This is also called an annual well check.  Dental exams once or twice a year.  Routine eye exams. Ask your health care provider how often you should have your eyes checked.  Personal lifestyle choices, including:  Daily care of your teeth and gums.  Regular physical activity.  Eating a healthy diet.  Avoiding tobacco and drug use.  Limiting alcohol use.  Practicing safe sex.  Taking low-dose aspirin every day.  Taking vitamin and mineral supplements as recommended by your health care provider. What happens during an annual well check? The services and screenings done by your health care provider during your annual well check will depend on your age, overall health, lifestyle risk factors, and family history of disease. Counseling  Your health care provider may ask you questions about your:  Alcohol use.  Tobacco use.  Drug use.  Emotional well-being.   Home and relationship well-being.  Sexual activity.  Eating habits.  History of falls.  Memory and ability to understand (cognition).  Work and work Statistician.  Reproductive health. Screening  You may have the following tests or measurements:  Height, weight, and BMI.  Blood pressure.  Lipid and cholesterol levels. These may be checked every 5 years, or more frequently if you are over 8 years old.  Skin check.  Lung cancer screening. You may have this screening every year starting at age 61 if you have a 30-pack-year history of smoking and currently smoke or have quit within the past 15 years.  Fecal occult blood test (FOBT) of the stool. You may have this test every year starting at age 63.  Flexible sigmoidoscopy or colonoscopy. You may have a sigmoidoscopy every 5 years or a colonoscopy every 10 years starting at age 59.  Hepatitis C blood test.  Hepatitis B blood test.  Sexually transmitted disease (STD) testing.  Diabetes screening. This is done by checking your blood sugar (glucose) after you have not eaten for a while (fasting). You may have this done every 1-3 years.  Bone density scan. This is done to screen for osteoporosis. You may have this done starting at age 68.  Mammogram. This may be done every 1-2 years. Talk to your health care provider about how often you should have regular mammograms. Talk with your health care provider about your test results, treatment options, and if necessary, the need for more tests. Vaccines  Your health care provider may recommend certain vaccines, such as:  Influenza vaccine. This is recommended every year.  Tetanus, diphtheria, and acellular pertussis (Tdap, Td) vaccine. You  may need a Td booster every 10 years.  Zoster vaccine. You may need this after age 82.  Pneumococcal 13-valent conjugate (PCV13) vaccine. One dose is recommended after age 79.  Pneumococcal polysaccharide (PPSV23) vaccine. One dose is  recommended after age 81. Talk to your health care provider about which screenings and vaccines you need and how often you need them. This information is not intended to replace advice given to you by your health care provider. Make sure you discuss any questions you have with your health care provider. Document Released: 08/28/2015 Document Revised: 04/20/2016 Document Reviewed: 06/02/2015 Elsevier Interactive Patient Education  2017 [place] Prevention in the Home Falls can cause injuries. They can happen to people of all ages. There are many things you can do to make your home safe and to help prevent falls. What can I do on the outside of my home?  Regularly fix the edges of walkways and driveways and fix any cracks.  Remove anything that might make you trip as you walk through a door, such as a raised step or threshold.  Trim any bushes or trees on the path to your home.  Use bright outdoor lighting.  Clear any walking paths of anything that might make someone trip, such as rocks or tools.  Regularly check to see if handrails are loose or broken. Make sure that both sides of any steps have handrails.  Any raised decks and porches should have guardrails on the edges.  Have any leaves, snow, or ice cleared regularly.  Use sand or salt on walking paths during winter.  Clean up any spills in your garage right away. This includes oil or grease spills. What can I do in the bathroom?  Use night lights.  Install grab bars by the toilet and in the tub and shower. Do not use towel bars as grab bars.  Use non-skid mats or decals in the tub or shower.  If you need to sit down in the shower, use a plastic, non-slip stool.  Keep the floor dry. Clean up any water that spills on the floor as soon as it happens.  Remove soap buildup in the tub or shower regularly.  Attach bath mats securely with double-sided non-slip rug tape.  Do not have throw rugs and other things on  the floor that can make you trip. What can I do in the bedroom?  Use night lights.  Make sure that you have a light by your bed that is easy to reach.  Do not use any sheets or blankets that are too big for your bed. They should not hang down onto the floor.  Have a firm chair that has side arms. You can use this for support while you get dressed.  Do not have throw rugs and other things on the floor that can make you trip. What can I do in the kitchen?  Clean up any spills right away.  Avoid walking on wet floors.  Keep items that you use a lot in easy-to-reach places.  If you need to reach something above you, use a strong step stool that has a grab bar.  Keep electrical cords out of the way.  Do not use floor polish or wax that makes floors slippery. If you must use wax, use non-skid floor wax.  Do not have throw rugs and other things on the floor that can make you trip. What can I do with my stairs?  Do not leave any items  on the stairs.  Make sure that there are handrails on both sides of the stairs and use them. Fix handrails that are broken or loose. Make sure that handrails are as long as the stairways.  Check any carpeting to make sure that it is firmly attached to the stairs. Fix any carpet that is loose or worn.  Avoid having throw rugs at the top or bottom of the stairs. If you do have throw rugs, attach them to the floor with carpet tape.  Make sure that you have a light switch at the top of the stairs and the bottom of the stairs. If you do not have them, ask someone to add them for you. What else can I do to help prevent falls?  Wear shoes that:  Do not have high heels.  Have rubber bottoms.  Are comfortable and fit you well.  Are closed at the toe. Do not wear sandals.  If you use a stepladder:  Make sure that it is fully opened. Do not climb a closed stepladder.  Make sure that both sides of the stepladder are locked into place.  Ask someone to  hold it for you, if possible.  Clearly mark and make sure that you can see:  Any grab bars or handrails.  First and last steps.  Where the edge of each step is.  Use tools that help you move around (mobility aids) if they are needed. These include:  Canes.  Walkers.  Scooters.  Crutches.  Turn on the lights when you go into a dark area. Replace any light bulbs as soon as they burn out.  Set up your furniture so you have a clear path. Avoid moving your furniture around.  If any of your floors are uneven, fix them.  If there are any pets around you, be aware of where they are.  Review your medicines with your doctor. Some medicines can make you feel dizzy. This can increase your chance of falling. Ask your doctor what other things that you can do to help prevent falls. This information is not intended to replace advice given to you by your health care provider. Make sure you discuss any questions you have with your health care provider. Document Released: 05/28/2009 Document Revised: 01/07/2016 Document Reviewed: 09/05/2014 Elsevier Interactive Patient Education  2017 [employer].

## 2019-03-10 ENCOUNTER — Other Ambulatory Visit: Payer: Self-pay | Admitting: Nurse Practitioner

## 2019-03-28 ENCOUNTER — Other Ambulatory Visit: Payer: Self-pay

## 2019-03-28 ENCOUNTER — Ambulatory Visit
Admission: RE | Admit: 2019-03-28 | Discharge: 2019-03-28 | Disposition: A | Discharge status: Home / Self Care | Payer: Medicare Other | Source: Ambulatory Visit | Attending: Nurse Practitioner | Admitting: Nurse Practitioner

## 2019-03-28 DIAGNOSIS — E2839 Other primary ovarian failure: Secondary | ICD-10-CM

## 2019-03-28 DIAGNOSIS — M810 Age-related osteoporosis without current pathological fracture: Secondary | ICD-10-CM

## 2019-04-20 ENCOUNTER — Other Ambulatory Visit: Payer: Self-pay | Admitting: Nurse Practitioner

## 2019-04-25 ENCOUNTER — Other Ambulatory Visit: Payer: Self-pay | Admitting: Nurse Practitioner

## 2019-05-11 ENCOUNTER — Other Ambulatory Visit: Payer: Self-pay | Admitting: Nurse Practitioner

## 2019-05-11 DIAGNOSIS — I509 Heart failure, unspecified: Secondary | ICD-10-CM

## 2019-06-01 ENCOUNTER — Other Ambulatory Visit: Payer: Self-pay | Admitting: Nurse Practitioner

## 2019-06-15 ENCOUNTER — Other Ambulatory Visit: Payer: Self-pay | Admitting: Nurse Practitioner

## 2019-06-22 IMAGING — CT CT ANGIO CHEST
2 of 6 series · 18 of 46 positions shown · IV contrast (ISOVUE 370)
Comparison: Radiography same day. CT 09/11/2014.

CLINICAL DATA: Shortness of breath.

EXAM:
CT ANGIOGRAPHY CHEST WITH CONTRAST
TECHNIQUE: Multidetector CT imaging of the chest was performed using the
standard protocol during bolus administration of intravenous
contrast. Multiplanar CT image reconstructions and MIPs were
obtained to evaluate the vascular anatomy.
CONTRAST:  80mL [id number] IOPAMIDOL ([id number]) INJECTION 76%

[Series 6: thins · axial · 0.64mm/px · z∈[+1312,+1538]mm · 15 of 248 slices shown]
[im 11/248  lung]
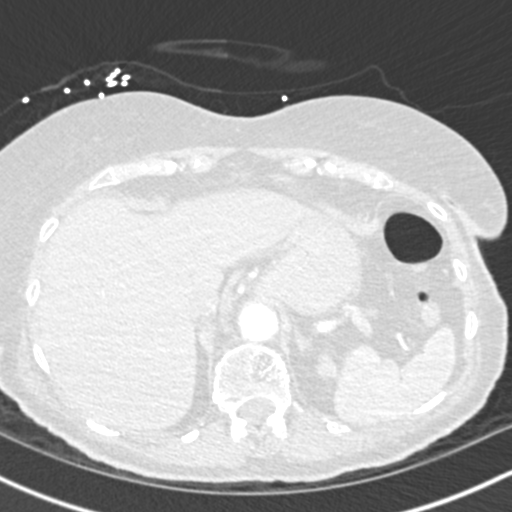
[im 33/248  soft-tissue]
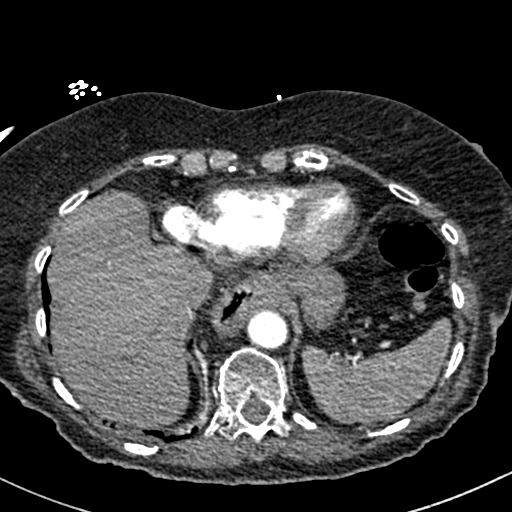
[im 43/248  lung]
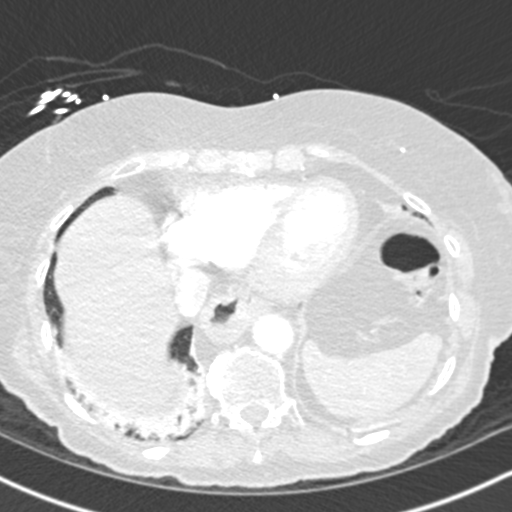
[im 65/248  soft-tissue]
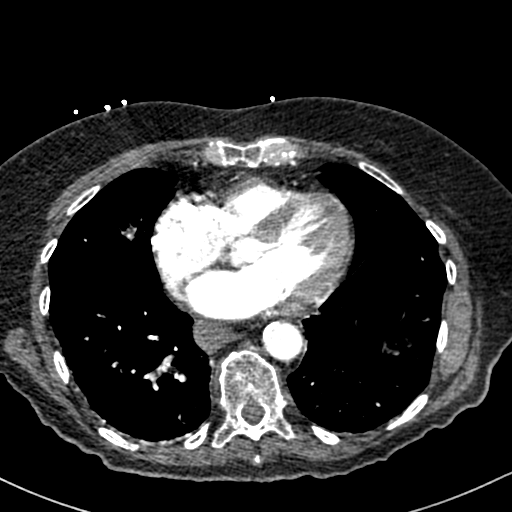
[im 76/248  lung]
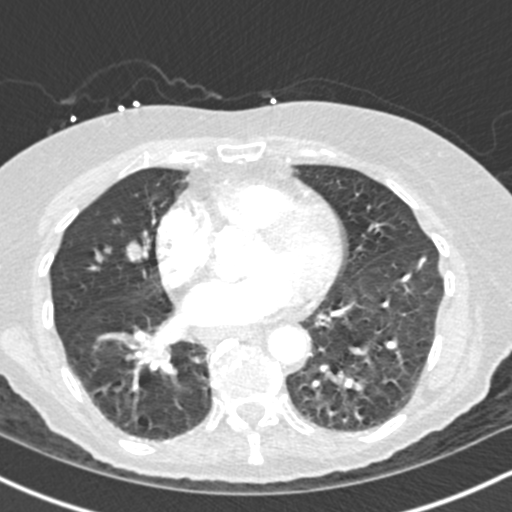
[im 97/248  soft-tissue]
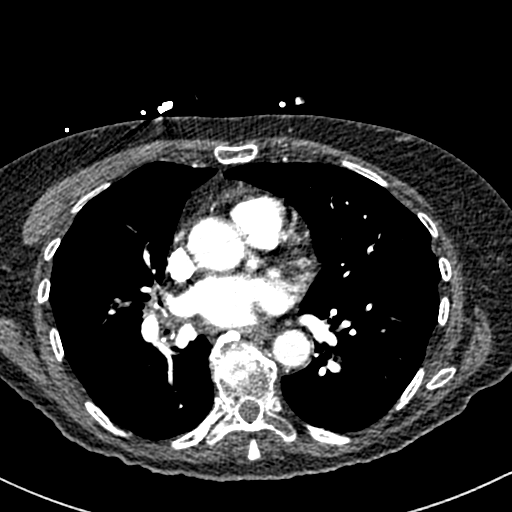
[im 108/248  lung]
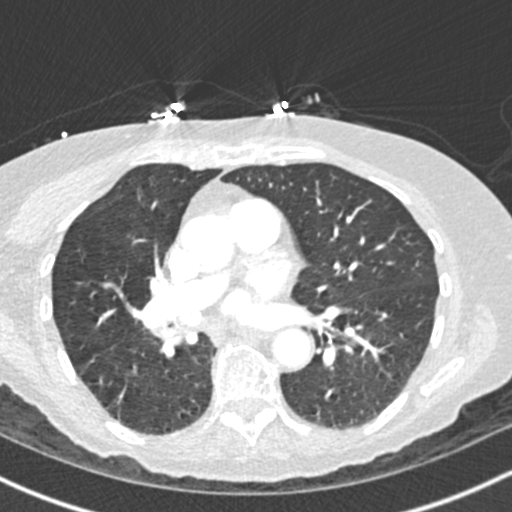
[im 129/248  soft-tissue]
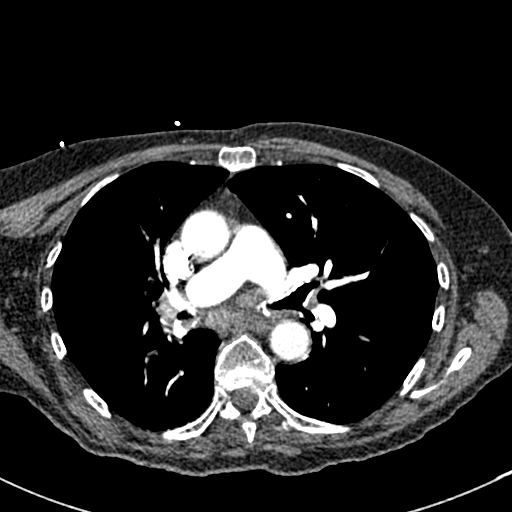
[im 140/248  lung]
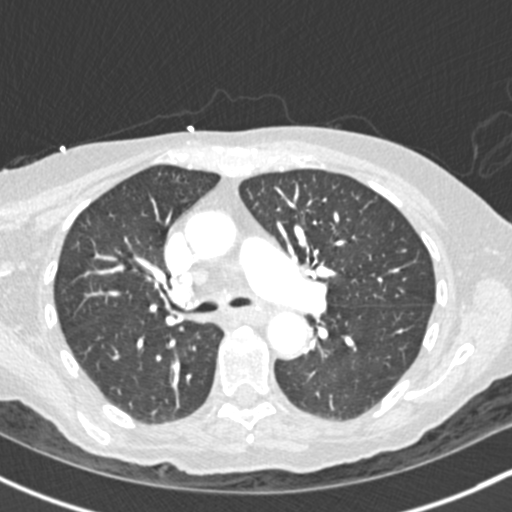
[im 151/248  soft-tissue]
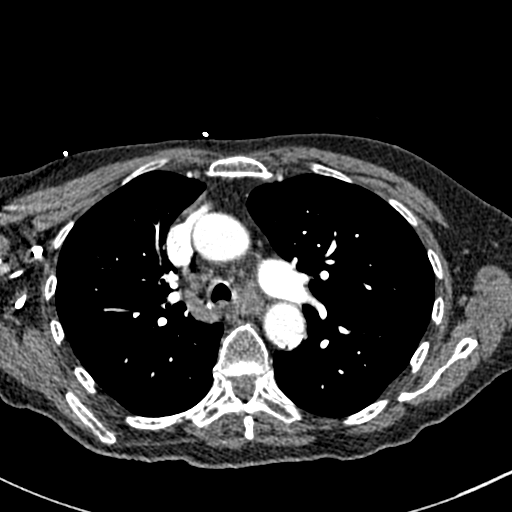
[im 172/248  lung]
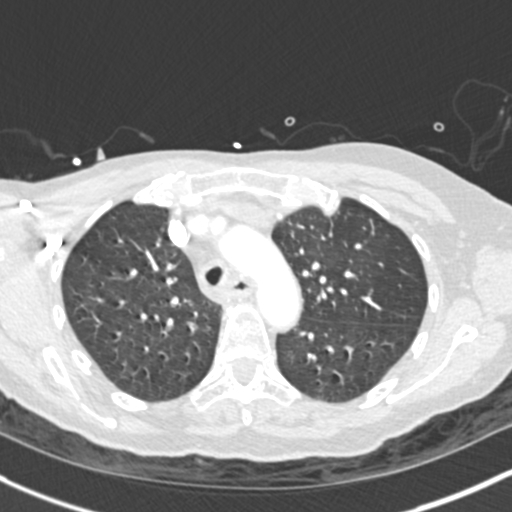
[im 183/248  soft-tissue]
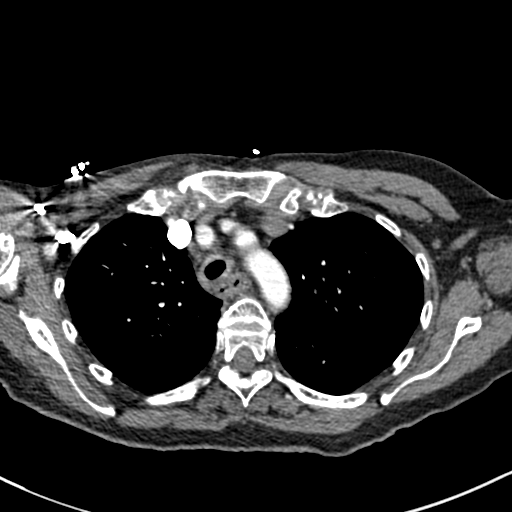
[im 205/248  lung]
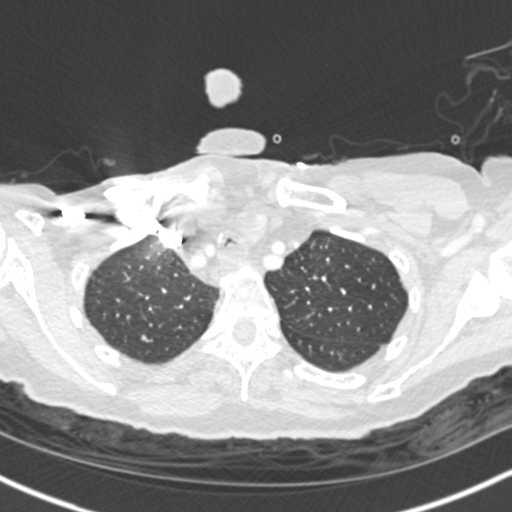
[im 215/248  soft-tissue]
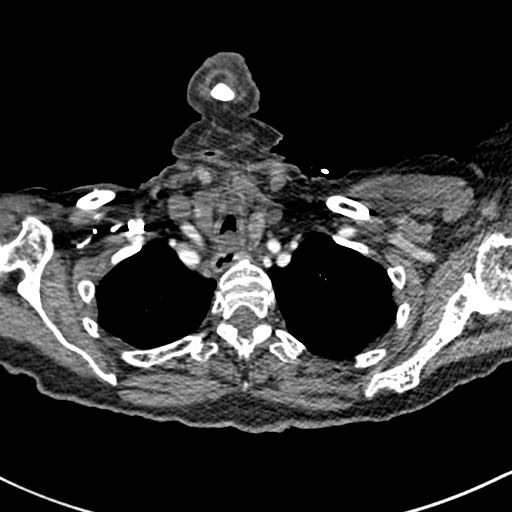
[im 237/248  lung]
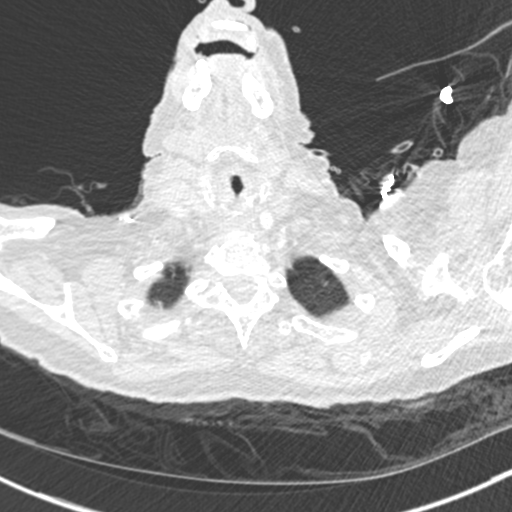

[Series 8: coronal mpr · coronal · 0.50mm/px · 3 of 112 slices shown]
[im 28/112  soft-tissue]
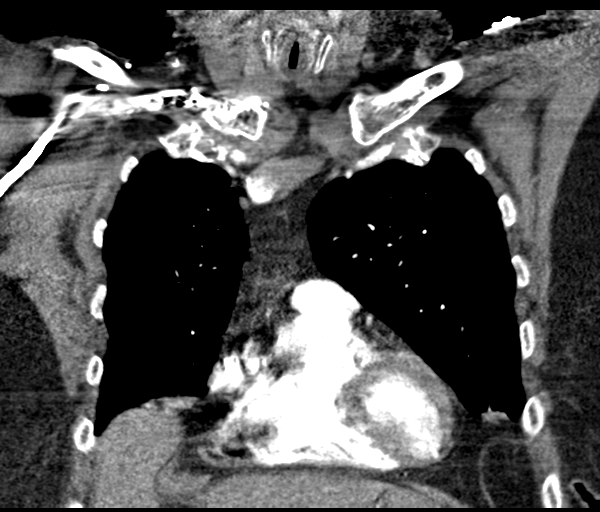
[im 56/112  soft-tissue]
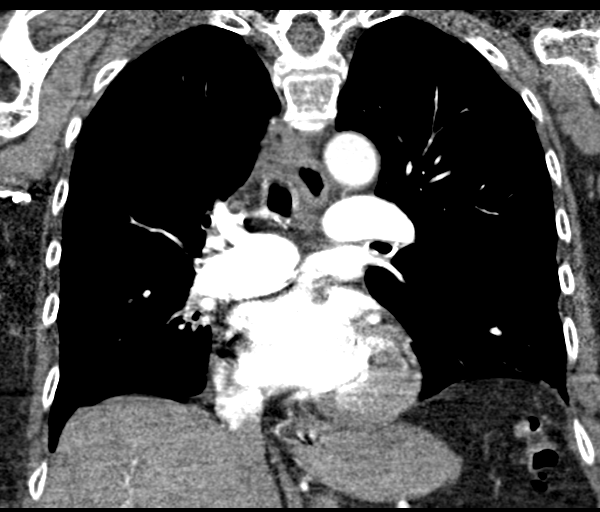
[im 84/112  soft-tissue]
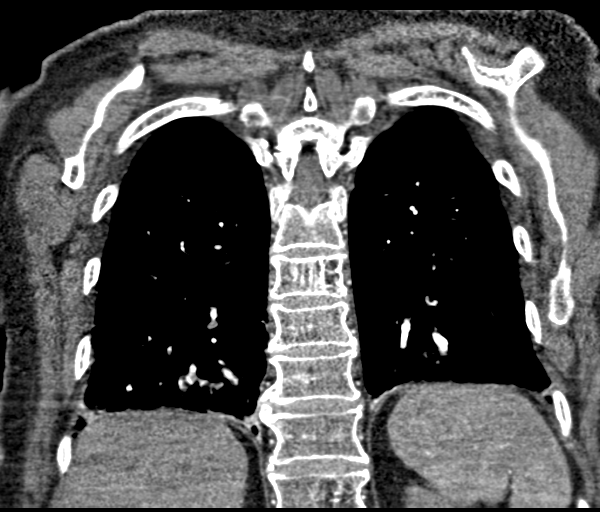

[18 of 46 positions shown; findings below may reference images not displayed]

FINDINGS: Cardiovascular: Pulmonary arterial opacification is good. There are
no pulmonary emboli. The heart is at the upper limits of normal in
size. There is some coronary artery calcification. There is aortic
atherosclerosis without aneurysm or dissection

Mediastinum/Nodes: No mediastinal mass or lymphadenopathy.

Lungs/Pleura: No pleural effusion. There is pronounced tracheal
narrowing which could be due to tracheomalacia and or
tracheobronchitis. Major bronchi are also narrowed with apparent
wall thickening. Particular narrowing of the lower lobe bronchi
right more than left. Atelectasis at the posterior inferior right
lower lobe. Chronic bronchiectasis and or bronchial atresia in the
right middle lobe as seen previously. Chronic small benign nodules
in the lingula as seen previously. No upper lobe parenchymal finding
other than a 3 mm nodule in the right upper lobe image 23 measuring
2.5 mm, likely to be benign.. No underlying emphysema.

Upper Abdomen: Chronic cyst in the left lobe of the liver. No
significant upper abdominal finding. Patient does have a small
hiatal hernia.

Musculoskeletal: Ordinary spondylosis.

Review of the MIP images confirms the above findings.
IMPRESSION: 1. No pulmonary emboli.
2. Pronounced tracheal narrowing which could be due to
tracheomalacia and/or tracheobronchitis. Narrowing and wall
thickening also of the main bronchial branches, particularly in the
right lower lobe. Partial atelectasis of the posterior inferior
right lower lobe.
3. Chronic bronchiectasis and or bronchial atresia in the right
middle lobe.
4. Coronary artery calcification.
5. Small hiatal hernia.

Aortic Atherosclerosis ([id number]).

## 2019-06-24 ENCOUNTER — Other Ambulatory Visit: Payer: Medicare Other

## 2019-06-24 ENCOUNTER — Other Ambulatory Visit: Payer: Self-pay

## 2019-06-24 DIAGNOSIS — E1122 Type 2 diabetes mellitus with diabetic chronic kidney disease: Secondary | ICD-10-CM

## 2019-06-24 DIAGNOSIS — E785 Hyperlipidemia, unspecified: Secondary | ICD-10-CM

## 2019-06-24 DIAGNOSIS — E1169 Type 2 diabetes mellitus with other specified complication: Secondary | ICD-10-CM

## 2019-06-25 LAB — COMPLETE METABOLIC PANEL WITH GFR
AG Ratio: 1.7 (calc) (ref 1.0–2.5)
ALT: 15 U/L (ref 6–29)
AST: 17 U/L (ref 10–35)
Albumin: 3.7 g/dL (ref 3.6–5.1)
Alkaline phosphatase (APISO): 51 U/L (ref 37–153)
BUN/Creatinine Ratio: 21 (calc) (ref 6–22)
BUN: 25 mg/dL (ref 7–25)
CO2: 31 mmol/L (ref 20–32)
Calcium: 10.4 mg/dL (ref 8.6–10.4)
Chloride: 105 mmol/L (ref 98–110)
Creat: 1.18 mg/dL — ABNORMAL HIGH (ref 0.60–0.88)
GFR, Est African American: 47 mL/min/{1.73_m2} — ABNORMAL LOW (ref >=60)
GFR, Est Non African American: 41 mL/min/{1.73_m2} — ABNORMAL LOW (ref >=60)
Globulin: 2.2 g/dL (calc) (ref 1.9–3.7)
Glucose, Bld: 74 mg/dL (ref 65–99)
Potassium: 3.8 mmol/L (ref 3.5–5.3)
Sodium: 145 mmol/L (ref 135–146)
Total Bilirubin: 0.5 mg/dL (ref 0.2–1.2)
Total Protein: 5.9 g/dL — ABNORMAL LOW (ref 6.1–8.1)

## 2019-06-25 LAB — LIPID PANEL
Cholesterol: 167 mg/dL (ref <200)
HDL: 66 mg/dL (ref >=50)
LDL Cholesterol (Calc): 76 mg/dL (calc)
Non-HDL Cholesterol (Calc): 101 mg/dL (calc) (ref <130)
Total CHOL/HDL Ratio: 2.5 (calc) (ref <5.0)
Triglycerides: 156 mg/dL — ABNORMAL HIGH (ref <150)

## 2019-06-25 LAB — HEMOGLOBIN A1C
Hgb A1c MFr Bld: 6.7 % of total Hgb — ABNORMAL HIGH (ref <5.7)
Mean Plasma Glucose: 146 (calc)
eAG (mmol/L): 8.1 (calc)

## 2019-07-01 ENCOUNTER — Other Ambulatory Visit: Payer: Self-pay

## 2019-07-01 ENCOUNTER — Encounter: Payer: Self-pay | Admitting: Nurse Practitioner

## 2019-07-01 ENCOUNTER — Ambulatory Visit (INDEPENDENT_AMBULATORY_CARE_PROVIDER_SITE_OTHER): Payer: Medicare Other | Admitting: Nurse Practitioner

## 2019-07-01 DIAGNOSIS — H9193 Unspecified hearing loss, bilateral: Secondary | ICD-10-CM | POA: Diagnosis not present

## 2019-07-01 DIAGNOSIS — Z794 Long term (current) use of insulin: Secondary | ICD-10-CM

## 2019-07-01 DIAGNOSIS — E034 Atrophy of thyroid (acquired): Secondary | ICD-10-CM

## 2019-07-01 DIAGNOSIS — Z86711 Personal history of pulmonary embolism: Secondary | ICD-10-CM

## 2019-07-01 DIAGNOSIS — M810 Age-related osteoporosis without current pathological fracture: Secondary | ICD-10-CM

## 2019-07-01 DIAGNOSIS — E1121 Type 2 diabetes mellitus with diabetic nephropathy: Secondary | ICD-10-CM

## 2019-07-01 DIAGNOSIS — E1169 Type 2 diabetes mellitus with other specified complication: Secondary | ICD-10-CM

## 2019-07-01 DIAGNOSIS — R21 Rash and other nonspecific skin eruption: Secondary | ICD-10-CM | POA: Diagnosis not present

## 2019-07-01 DIAGNOSIS — N1832 Chronic kidney disease, stage 3b: Secondary | ICD-10-CM

## 2019-07-01 DIAGNOSIS — I509 Heart failure, unspecified: Secondary | ICD-10-CM

## 2019-07-01 DIAGNOSIS — E785 Hyperlipidemia, unspecified: Secondary | ICD-10-CM

## 2019-07-01 DIAGNOSIS — E440 Moderate protein-calorie malnutrition: Secondary | ICD-10-CM

## 2019-07-01 NOTE — Progress Notes (Signed)
This service is provided via telemedicine  No vital signs collected/recorded due to the encounter was a telemedicine visit.   Location of patient (ex: home, work):  Home   Patient consents to a telephone visit:  Yes  Location of the provider (ex: office, home): [facility], Office   Name of any referring provider:  N/A  Names of all persons participating in the telemedicine service and their role in the encounter:  S.Chrae B/CMA, Sherrie Mustache, NP, and Patient   Time spent on call: 8 min with medical assistant       Careteam: Patient Care Team: Lauree Chandler, NP as PCP - General (Nurse Practitioner) Larey Dresser, MD as PCP - Advanced Heart Failure (Cardiology) Tanda Rockers, MD as Consulting Physician (Pulmonary Disease) Larey Dresser, MD as Consulting Physician (Cardiology)  Advanced Directive information    Allergies  Allergen Reactions  . Lisinopril Shortness Of Breath    Tracheal Edema  . Penicillins Other (See Comments)    DID THE REACTION INVOLVE: Swelling of the face/tongue/throat, SOB, or low BP? Yes Sudden or severe rash/hives, skin peeling, or the inside of the mouth or nose? No Did it require medical treatment? No When did it last happen?more than 10 years  If all above answers are "NO", may proceed with cephalosporin use.   Throat felt tight    Chief Complaint  Patient presents with  . Medical Management of Chronic Issues    3 month follow-up and discuss labs (copy to be mailed with AVS). Patient with welps on body (ongoing concern) since March 2020. Patient seen Dinah, NP and dermatologist for this concern.Telephone visit   . Medication Management    Discuss drug disease interaction (renal impairment and alendronate)   . Immunizations    Discuss need for PNA and TDaP/TD. Patient refused gflu vaccine    . Health Maintenance    Foot exam due at next in person visit      HPI: Patient is a 85 y.o. female via  televisit for 3 month follow up.   Ongoing concern due to rash- went to dermatologist regarding issue but did not have an issue on the day she was there. Reports these lesion/rash came up 3 days ago, red, used to itch but not itching now. Will be gone in a day or so. Seem to resolve quickly without complications.   Hyperlipidemia- LDL improving on last labs.   chf-stable, continues on lasix 20 mg daily  oab-unchanged.   Osteoporosis- recommended her not take alendronate due to cr clearance. dexa up to date showing osteoporosis. Continues on vit d. Cal stopped due to elevated cal level.  DM- controlled on tradjenta, A1c 6.7. goes for eye check up next week.  Hx of PE- continues on eliquis, without signs of bleeding or  Excessive bruising.   Hypothyroid- continues on synthroid 100 mcg  Memory loss- has a hard time recalling names, otherwise stable, continues on aricept 10 mg daily  Declines flu vaccine and pneumonia vaccine.   Review of Systems:  Review of Systems  Constitutional: Negative for chills, fever and weight loss.  HENT: Positive for hearing loss. Negative for tinnitus.   Respiratory: Negative for cough, sputum production and shortness of breath.   Cardiovascular: Negative for chest pain, palpitations and leg swelling.  Gastrointestinal: Negative for abdominal pain, constipation, diarrhea and heartburn.  Genitourinary: Negative for dysuria, frequency and urgency.  Musculoskeletal: Negative for back pain, falls, joint pain and myalgias.  Skin: Negative.  Skin lesions that come up but heal without problem  Neurological: Negative for dizziness, tingling and headaches.  Psychiatric/Behavioral: Positive for memory loss. Negative for depression. The patient does not have insomnia.     Past Medical History:  Diagnosis Date  . Arthritis    bilateral knees  . Chronic diastolic CHF (congestive heart failure) ([place])   . CKD (chronic kidney disease), stage III   . Diabetes  mellitus without complication ([place])   . Edema   . Hyperlipidemia   . Hypertension   . Hypothyroidism   . Pulmonary embolism ([place])   . Urinary incontinence    Past Surgical History:  Procedure Laterality Date  . BLADDER SURGERY  2000   Dr.Tannerbaum (Bladder Tact)  . CATARACT EXTRACTION  03/2013   Dr.Shapiro  . CHOLECYSTECTOMY    . EYE SURGERY     bilateral cataracts  . [facility]   . right elbow  1999   x 2, still has one piece of metal in it  . TONSILLECTOMY AND ADENOIDECTOMY  1937   Dr.Brewer   Social History:   reports that she has never smoked. She has never used smokeless tobacco. She reports that she does not drink alcohol or use drugs.  Family History  Problem Relation Age of Onset  . Suicidality Father   . Cancer Brother   . Cancer Maternal Grandfather   . Diabetes Maternal Grandmother   . Melanoma Brother     Medications: Patient's Medications  New Prescriptions   No medications on file  Previous Medications   ALENDRONATE (FOSAMAX) 70 MG TABLET    TAKE 1 TABLET BY MOUTH ONCE WEEKLY TAKE WITH FULL GLASS OF WATER ON AN EMPTY STOMACH   AMLODIPINE (NORVASC) 5 MG TABLET    TAKE 1 TABLET BY MOUTH EVERY DAY   BD PEN NEEDLE NANO U/F 32G X 4 MM MISC    USE AS DIRECTED ONCE DAILY   CHOLECALCIFEROL (VITAMIN D3) 5000 UNITS CHEW    Chew 5,000 Units by mouth daily.    DONEPEZIL (ARICEPT) 10 MG TABLET    TAKE ONE TABLET BY MOUTH ONCE DAILY AT BEDTIME TO PRESERVE MEMORY   ELIQUIS 5 MG TABS TABLET    TAKE 1 TABLET BY MOUTH TWICE A DAY   FUROSEMIDE (LASIX) 20 MG TABLET    TAKE 1 TABLET BY MOUTH EVERY DAY   LEVEMIR FLEXTOUCH 100 UNIT/ML PEN    INJECT 24 UNITS INTO THE SKIN DAILY.   LEVOTHYROXINE (SYNTHROID) 100 MCG TABLET    TAKE 1 TABLET BY MOUTH EVERY DAY BEFORE BREAKFAST   MULTIPLE VITAMIN (MULTIVITAMIN WITH MINERALS) TABS TABLET    Take 1 tablet by mouth daily.   NUTRITIONAL SUPPLEMENTS (GLUCERNA SHAKE PO)    Take by mouth.   ONETOUCH  DELICA LANCETS 77N MISC    USE EVERY OTHER DAY TO TEST BLOOD SUGAR   ONETOUCH ULTRA TEST STRIP    USE DAILY TO TEST BLOOD SUGAR. DX: E11.22   ROSUVASTATIN (CRESTOR) 40 MG TABLET    TAKE 1 TABLET EVERY DAY FOR CHOLESTEROL   TRADJENTA 5 MG TABS TABLET    TAKE 1 TABLET BY MOUTH EVERY DAY  Modified Medications   No medications on file  Discontinued Medications   No medications on file    Physical Exam:  There were no vitals filed for this visit. There is no height or weight on file to calculate BMI. Wt Readings from Last 3 Encounters:  02/27/19 216  lb 9.6 oz (98.2 kg)  01/01/19 219 lb (99.3 kg)  12/31/18 218 lb (98.9 kg)     Labs reviewed: Basic Metabolic Panel: Recent Labs    09/20/18 1410 12/24/18 0925 06/24/19 0844  NA 140 141 145  K 4.1 4.1 3.8  CL 103 104 105  CO2 _0 GLUCOSE 172* 155* 74  BUN 27* 25 25  CREATININE 1.36* 1.28* 1.18*  CALCIUM 9.8 11.2* 10.4  TSH  --  0.47  --    Liver Function Tests: Recent Labs    08/30/18 0224 09/03/18 1355 12/24/18 0925 06/24/19 0844  AST 28 50* 17 17  ALT 20 50* 15 15  ALKPHOS 40 45  --   --   BILITOT 0.7 0.8 0.5 0.5  PROT 6.3* 6.7 6.1 5.9*  ALBUMIN 3.5 3.6  --   --    No results for input(s): LIPASE, AMYLASE in the last 8760 hours. No results for input(s): AMMONIA in the last 8760 hours. CBC: Recent Labs    08/30/18 0224 08/31/18 0508 09/03/18 1355 12/24/18 0925  WBC 8.1 9.8 10.8* 7.4  NEUTROABS 6.4  --  7.8* 3,781  HGB 13.5 12.4 15.0 14.7  HCT 43.5 40.1 48.0* 44.6  MCV 93.1 92.8 92.7 89.9  PLT 131* 141* 192 203   Lipid Panel: Recent Labs    12/24/18 0925 06/24/19 0844  CHOL 217* 167  HDL 65 66  LDLCALC 116* 76  TRIG 239* 156*  CHOLHDL 3.3 2.5   TSH: Recent Labs    12/24/18 0925  TSH 0.47   A1C: Lab Results  Component Value Date   HGBA1C 6.7 (H) 06/24/2019     Assessment/Plan 1. Bilateral hearing loss, unspecified hearing loss type -interested in hearing aids at this time. -  Ambulatory referral to Audiology  2. Stage 3b chronic kidney disease Stable on recent labs, Encourage proper hydration and to avoid NSAIDS (Aleve, Advil, Motrin, Ibuprofen)   3. Rash and nonspecific skin eruption Followed by dermatologist, area heal quickly without complications. Will monitor.  4. Chronic congestive heart failure, unspecified heart failure type ([place]) -stable, without shortness of breath, chest pains, or edema. Will continue current regimen.  - CMP with eGFR([facility]); Future - CBC with Differential/Platelet; Future  5. Type 2 diabetes mellitus with stage 3b chronic kidney disease, with long-term current use of insulin ([place]) -continues on tradjenta only, encouraged dietary modifications.  Encouraged dietary compliance, routine foot care/monitoring and to keep up with diabetic eye exams through ophthalmology  - Hemoglobin A1c; Future  6. Hyperlipidemia associated with type 2 diabetes mellitus ([place]) LDL with improved on crestor. To continue current medication as prescribed with dietary modifications.  - CMP with eGFR([facility]); Future - Lipid panel; Future  7. Hypothyroidism due to acquired atrophy of thyroid TSH stable on last labs, continues on synthroid 100 mcg daily.  - TSH; Future  8. History of pulmonary embolus (PE) -remains stable on eliquis, without signs of recurrence.  - CBC with Differential/Platelet; Future  9. Malnutrition of moderate degree ([place]) Continues with low protein. Encouraged proper diet with foods that have protein vs "snacky" empty calorie foods. - CMP with eGFR([facility]); Future  10. Osteoporosis, unspecified osteoporosis type, unspecified pathological fracture presence - alendronate not recommended with GFR <35, currently improved however due to CKD will stop alendronate. Pt agreeable to start prolia pending prior authorization. Forms completed. To continue vit D, calcium level became elevated on supplement. - CMP with eGFR([facility]); Future  Next  appt: 6 months with labs  prior to appt.  Carlos American. Harle Battiest  [facility] [phone]   Virtual Visit via Telephone Note  I connected with pt on 07/01/19 at 10:00 AM EST by telephone and verified that I am speaking with the correct person using two identifiers.  Location: Patient: home Provider: office   I discussed the limitations, risks, security and privacy concerns of performing an evaluation and management service by telephone and the availability of in person appointments. I also discussed with the patient that there may be a patient responsible charge related to this service. The patient expressed understanding and agreed to proceed.   I discussed the assessment and treatment plan with the patient. The patient was provided an opportunity to ask questions and all were answered. The patient agreed with the plan and demonstrated an understanding of the instructions.   The patient was advised to call back or seek an in-person evaluation if the symptoms worsen or if the condition fails to improve as anticipated.  I provided 22 minutes of non-face-to-face time during this encounter.  Carlos American. Harle Battiest Avs printed and mailed

## 2019-07-06 ENCOUNTER — Other Ambulatory Visit: Payer: Self-pay | Admitting: Nurse Practitioner

## 2019-07-08 LAB — HM DIABETES EYE EXAM

## 2019-07-09 ENCOUNTER — Encounter: Payer: Self-pay | Admitting: *Deleted

## 2019-07-12 ENCOUNTER — Other Ambulatory Visit: Payer: Self-pay | Admitting: Nurse Practitioner

## 2019-07-12 DIAGNOSIS — I509 Heart failure, unspecified: Secondary | ICD-10-CM

## 2019-07-13 ENCOUNTER — Other Ambulatory Visit: Payer: Self-pay | Admitting: Nurse Practitioner

## 2019-07-21 ENCOUNTER — Other Ambulatory Visit: Payer: Self-pay | Admitting: Nurse Practitioner

## 2019-07-27 ENCOUNTER — Other Ambulatory Visit: Payer: Self-pay | Admitting: Nurse Practitioner

## 2019-07-27 DIAGNOSIS — R413 Other amnesia: Secondary | ICD-10-CM

## 2019-08-03 ENCOUNTER — Other Ambulatory Visit: Payer: Self-pay | Admitting: Nurse Practitioner

## 2019-08-03 DIAGNOSIS — E785 Hyperlipidemia, unspecified: Secondary | ICD-10-CM

## 2019-08-03 DIAGNOSIS — E1169 Type 2 diabetes mellitus with other specified complication: Secondary | ICD-10-CM

## 2019-08-20 ENCOUNTER — Other Ambulatory Visit: Payer: Self-pay

## 2019-08-20 ENCOUNTER — Encounter (HOSPITAL_COMMUNITY): Payer: Self-pay | Admitting: *Deleted

## 2019-08-20 ENCOUNTER — Encounter (HOSPITAL_COMMUNITY): Payer: Self-pay | Admitting: Cardiology

## 2019-08-20 ENCOUNTER — Ambulatory Visit (HOSPITAL_COMMUNITY)
Admission: RE | Admit: 2019-08-20 | Discharge: 2019-08-20 | Disposition: A | Discharge status: Home / Self Care | Payer: Medicare PPO | Source: Ambulatory Visit | Attending: Cardiology | Admitting: Cardiology

## 2019-08-20 VITALS — BP 143/88 | HR 94 | Wt 219.8 lb

## 2019-08-20 DIAGNOSIS — Z79899 Other long term (current) drug therapy: Secondary | ICD-10-CM | POA: Diagnosis not present

## 2019-08-20 DIAGNOSIS — I5032 Chronic diastolic (congestive) heart failure: Secondary | ICD-10-CM

## 2019-08-20 DIAGNOSIS — E1122 Type 2 diabetes mellitus with diabetic chronic kidney disease: Secondary | ICD-10-CM | POA: Insufficient documentation

## 2019-08-20 DIAGNOSIS — Z7983 Long term (current) use of bisphosphonates: Secondary | ICD-10-CM | POA: Diagnosis not present

## 2019-08-20 DIAGNOSIS — I130 Hypertensive heart and chronic kidney disease with heart failure and stage 1 through stage 4 chronic kidney disease, or unspecified chronic kidney disease: Secondary | ICD-10-CM | POA: Insufficient documentation

## 2019-08-20 DIAGNOSIS — N183 Chronic kidney disease, stage 3 unspecified: Secondary | ICD-10-CM | POA: Insufficient documentation

## 2019-08-20 DIAGNOSIS — M170 Bilateral primary osteoarthritis of knee: Secondary | ICD-10-CM | POA: Diagnosis not present

## 2019-08-20 DIAGNOSIS — E785 Hyperlipidemia, unspecified: Secondary | ICD-10-CM | POA: Diagnosis not present

## 2019-08-20 DIAGNOSIS — E039 Hypothyroidism, unspecified: Secondary | ICD-10-CM | POA: Insufficient documentation

## 2019-08-20 DIAGNOSIS — Z79890 Hormone replacement therapy (postmenopausal): Secondary | ICD-10-CM | POA: Insufficient documentation

## 2019-08-20 DIAGNOSIS — I2729 Other secondary pulmonary hypertension: Secondary | ICD-10-CM

## 2019-08-20 DIAGNOSIS — Z794 Long term (current) use of insulin: Secondary | ICD-10-CM | POA: Diagnosis not present

## 2019-08-20 DIAGNOSIS — I50810 Right heart failure, unspecified: Secondary | ICD-10-CM | POA: Insufficient documentation

## 2019-08-20 DIAGNOSIS — Z7901 Long term (current) use of anticoagulants: Secondary | ICD-10-CM | POA: Diagnosis not present

## 2019-08-20 LAB — BASIC METABOLIC PANEL
Anion gap: 10 (ref 5–15)
BUN: 23 mg/dL (ref 8–23)
CO2: 28 mmol/L (ref 22–32)
Calcium: 10.3 mg/dL (ref 8.9–10.3)
Chloride: 103 mmol/L (ref 98–111)
Creatinine, Ser: 1.22 mg/dL — ABNORMAL HIGH (ref 0.44–1.00)
GFR calc Af Amer: 45 mL/min — ABNORMAL LOW (ref >60)
GFR calc non Af Amer: 39 mL/min — ABNORMAL LOW (ref >60)
Glucose, Bld: 167 mg/dL — ABNORMAL HIGH (ref 70–99)
Potassium: 4.4 mmol/L (ref 3.5–5.1)
Sodium: 141 mmol/L (ref 135–145)

## 2019-08-20 LAB — BRAIN NATRIURETIC PEPTIDE: B Natriuretic Peptide: 43.5 pg/mL (ref 0.0–100.0)

## 2019-08-20 NOTE — Progress Notes (Signed)
Patient ID: Melissa Shannon, female   DOB: 11-Feb-1928, 86 y.o.   MRN: [id number] PCP: Dr. Bubba Camp Cardiology: Dr. Aundra Dubin  86 y.o. with history of HTN, DM, and immobility due to osteoarthritis presents for followup of right heart failure and PE.  At baseline, patient is not very active.  She has knee arthritis that is very limiting.  She does not do much walking and uses a walker to get around.  She has been very limited for a few years now.  She was admitted in 10/15 with dyspnea.  The dyspnea began about 2-3 days prior to admission.  She would get short of breath after walking about 20 feet with her walker.  She was noted to be in CHF at admission with troponin elevated to 0.6.  Echo showed normal LV size and systolic function but severely dilated/dysfunctional RV with pulmonary hypertension.  V/Q scan was done and showed intermediate probability for PE.  She was seen by pulmonary and thought to have a PE.  Apixaban was started.  She was diuresed with IV Lasix.  Repeat echo was done 3/16.  This showed EF 55-60%, normal RV size and systolic function, and PA systolic pressure 28 mmHg.  V/Q scan in 5/16 was normal.   She was admitted in 4/16 with altered mental status, likely due to E coli urosepsis.   She was admitted in 1/20 after getting tracheal edema likely as an ACEI reaction.  Echo in 1/20 showed EF 55-60% with mild AS and normal RV size and systolic function.   She has had more exertional dyspnea recently.  She has generally not been very active during the COVD-19 epidemic.  She is short of breath walking about 50-75 feet.  She uses her walker.  No orthopnea/PND.  No chest pain.    REDS clip 25%.   ECG (personally reviewed): NSR, normal   Labs (06/18/14): K 4.4, creatinine 1.44, HCT 35.9, BNP 10167 => 1415 Labs (09/01/2014): K 4.2 Creatinine 1.23  Labs (2/16): K 4.3, creatinine 1.62, HCT 40.7 Labs (7/16): K 5.2, creatinine 1.66 Labs (6/17): creatinine 1.25, LDL 64 Labs (7/17): HCT  43.5 Labs (10/17): K 4.9, creatinine 1.44, BNP 25 Labs (2/18): K 4.6, creatinine 1.27 Labs (7/19): LDL 77, pro-BNP 34, hgb 14.2  Labs (11/20): LDL 76, K 3.8, creatinine 1.18  PMH: 1. HTN - tracheal edema with ACEI.  2. Hyperlipidemia 3. Osteoarthritis: Involving knees, uses walker.  4. Type II diabetes 5. H/o CCY 6. CKD stage III 7. Hypothyroidism 8. PE: Admitted 10/15 with dyspnea/CHF.  Echo (10/15) with EF 60-65%, D-shaped interventricular septum, severely dilated RV with moderately decreased RV systolic function, PA systolic pressure 47 mmHg.  V/Q scan (10/15) was intermediate probability for PE. Patient was started on apixaban.  Echo (3/16) with EF 55-60%, basal inferior hypokinesis, normal RV size and systolic function, PA systolic pressure 28 mmHg. V/Q scan 5/16 was normal.  - Echo (1/20): EF 55-60%, mild AS, normal RV size and systolic function.  9. Nodules on CT chest 10/15: Per Dr Melvyn Novas, likely benign findings.  10. GERD 11. Aortic stenosis: Mild on 1/20 echo (mean gradient 13).    FH: No h/o venous thromboembolism, no heart problems that she knows of.   SH: Widow, lives alone, nonsmoker, 3 kids.   ROS: All systems reviewed and negative except as per HPI.   Current Outpatient Medications  Medication Sig Dispense Refill  . alendronate (FOSAMAX) 70 MG tablet TAKE 1 TABLET BY MOUTH ONCE WEEKLY TAKE WITH FULL  GLASS OF WATER ON AN EMPTY STOMACH 12 tablet 6  . amLODipine (NORVASC) 5 MG tablet TAKE 1 TABLET BY MOUTH EVERY DAY 90 tablet 1  . BD PEN NEEDLE NANO U/F 32G X 4 MM MISC USE AS DIRECTED ONCE DAILY 100 each 11  . Cholecalciferol (VITAMIN D3) 5000 UNITS CHEW Chew 5,000 Units by mouth daily.     Marland Kitchen donepezil (ARICEPT) 10 MG tablet TAKE 1 TABLET BY MOUTH ONCE DAILY AT BEDTIME TO PRESERVE MEMORY 90 tablet 1  . ELIQUIS 5 MG TABS tablet TAKE 1 TABLET BY MOUTH TWICE A DAY 60 tablet 5  . furosemide (LASIX) 20 MG tablet Take 1 tablet (20 mg total) by mouth daily. DX code I50.9 30  tablet 5  . LEVEMIR FLEXTOUCH 100 UNIT/ML Pen INJECT 24 UNITS INTO THE SKIN DAILY. 15 mL 5  . levothyroxine (SYNTHROID) 100 MCG tablet TAKE 1 TABLET BY MOUTH EVERY DAY BEFORE BREAKFAST 90 tablet 2  . Multiple Vitamin (MULTIVITAMIN WITH MINERALS) TABS tablet Take 1 tablet by mouth daily. 30 tablet 0  . Nutritional Supplements (GLUCERNA SHAKE PO) Take by mouth.    Glory Rosebush DELICA LANCETS 93J MISC USE EVERY OTHER DAY TO TEST BLOOD SUGAR 100 each 3  . ONETOUCH ULTRA test strip USE DAILY TO TEST BLOOD SUGAR. DX: E11.22 100 strip 11  . rosuvastatin (CRESTOR) 40 MG tablet TAKE 1 TABLET BY MOUTH EVERY DAY FOR CHOLESTEROL 90 tablet 1  . TRADJENTA 5 MG TABS tablet TAKE 1 TABLET BY MOUTH EVERY DAY 90 tablet 1   No current facility-administered medications for this encounter.   BP (!) 143/88   Pulse 94   Wt 99.7 kg (219 lb 12.8 oz)   SpO2 95%   BMI 33.42 kg/m   General: NAD Neck: No JVD, no thyromegaly or thyroid nodule.  Lungs: Clear to auscultation bilaterally with normal respiratory effort. CV: Nondisplaced PMI.  Heart regular S1/S2, no S3/S4, no murmur.  Non-pitting lymphedema lower legs.  No carotid bruit.  Normal pedal pulses.  Abdomen: Soft, nontender, no hepatosplenomegaly, no distention.  Skin: Intact without lesions or rashes.  Neurologic: Alert and oriented x 3.  Psych: Normal affect. Extremities: No clubbing or cyanosis.  HEENT: Normal.   Assessment/Plan 1. Right heart failure: Echo on 10/15 showed dilated RV with decreased systolic function.  V/Q scan with suspected PE. 1/20 echo showed normal RV size and systolic function with normal estimate PA systolic pressure.  5/16 V/Q scan showed no PE.  She is not volume overloaded by exam or REDS clip.  However, increased dyspnea recently.  I suspect that this is most likely due to deconditioning. - I will arrange for repeat echo to reassess LV and RV.  - Check BNP.  - Continue current Lasix regimen, she is taking 20 mg daily.  BMET  today.  2. PE: V/Q scan intermediate probability for PE in 10/15.  Pulmonary saw and suspect that she did indeed have PE.  The PE was spontaneous.  She is at risk given general immobility. No bleeding problems.  She did not have evidence for chronic PE on 5/16 V/Q scan.   - I would recommend that she continue apixaban long-term to decrease risk as she will likely remain fairly immobile long-term due to her arthritis.  3. HTN: BP reasonably controlled.    4. Hyperlipidemia: Continue Crestor, good LDL in 11/20.    Followup in 6 months.   Loralie Champagne 08/20/2019

## 2019-08-20 NOTE — [place] Notes (Signed)
Patient referred to Clinical Exercise Physiologist by Dr. Aundra Dubin for guidance and discussion about safe home exercises and/or starting an exercise program. Exercises were demonstrated and detailed with safety precautions to patient and accompanying caregiver (if present). Patient was presented with an information packet including demonstrations of the exercises discussed. All patient's questions were answered and patient was given contact information for further questions or concerns regarding their exercise.     Melissa Martins, MS, ACSM-RCEP Clinical Exercise Physiologist

## 2019-08-20 NOTE — Patient Instructions (Signed)
Labs today We will only contact you if something comes back abnormal or we need to make some changes. Otherwise no news is good news!  Your physician has requested that you have an echocardiogram. Echocardiography is a painless test that uses sound waves to create images of your heart. It provides your doctor with information about the size and shape of your heart and how well your heart's chambers and valves are working. This procedure takes approximately one hour. There are no restrictions for this procedure.   Your physician recommends that you schedule a follow-up appointment in: 6 months with Dr Aundra Dubin. You will get a call in the spring to schedule this appointment. Please call our office if you do not receive this call.   Please call office at [phone] option 2 if you have any questions or concerns.    At the [facility], you and your health needs are our priority. As part of our continuing mission to provide you with exceptional heart care, we have created designated Provider Care Teams. These Care Teams include your primary Cardiologist (physician) and Advanced Practice Providers (APPs- Physician Assistants and Nurse Practitioners) who all work together to provide you with the care you need, when you need it.   You may see any of the following providers on your designated Care Team at your next follow up: Marland Kitchen Dr Glori Bickers . Dr Loralie Champagne . Darrick Grinder, NP . Lyda Jester, PA . Audry Riles, PharmD   Please be sure to bring in all your medications bottles to every appointment.

## 2019-08-20 NOTE — [place] Notes (Signed)
ReDS Vest / Clip - 08/20/19 1200      ReDS Vest / Clip   Station Marker  C    Ruler Value  30.5    ReDS Value Range  Low volume    ReDS Actual Value  25

## 2019-08-23 ENCOUNTER — Ambulatory Visit: Payer: Medicare Other

## 2019-08-29 ENCOUNTER — Ambulatory Visit (HOSPITAL_COMMUNITY)
Admission: RE | Admit: 2019-08-29 | Discharge: 2019-08-29 | Disposition: A | Discharge status: Home / Self Care | Payer: Medicare PPO | Source: Ambulatory Visit | Attending: Internal Medicine | Admitting: Internal Medicine

## 2019-08-29 ENCOUNTER — Other Ambulatory Visit: Payer: Self-pay

## 2019-08-29 DIAGNOSIS — E785 Hyperlipidemia, unspecified: Secondary | ICD-10-CM | POA: Diagnosis not present

## 2019-08-29 DIAGNOSIS — I350 Nonrheumatic aortic (valve) stenosis: Secondary | ICD-10-CM | POA: Insufficient documentation

## 2019-08-29 DIAGNOSIS — I130 Hypertensive heart and chronic kidney disease with heart failure and stage 1 through stage 4 chronic kidney disease, or unspecified chronic kidney disease: Secondary | ICD-10-CM | POA: Diagnosis not present

## 2019-08-29 DIAGNOSIS — E1122 Type 2 diabetes mellitus with diabetic chronic kidney disease: Secondary | ICD-10-CM | POA: Insufficient documentation

## 2019-08-29 DIAGNOSIS — I5032 Chronic diastolic (congestive) heart failure: Secondary | ICD-10-CM | POA: Diagnosis present

## 2019-08-29 DIAGNOSIS — N189 Chronic kidney disease, unspecified: Secondary | ICD-10-CM | POA: Insufficient documentation

## 2019-08-29 NOTE — Progress Notes (Signed)
  Echocardiogram 2D Echocardiogram has been performed.  Melissa Shannon Melita Villalona 08/29/2019, 11:15 AM

## 2019-09-02 ENCOUNTER — Ambulatory Visit: Payer: Medicare PPO

## 2019-09-02 ENCOUNTER — Other Ambulatory Visit: Payer: Self-pay

## 2019-09-02 DIAGNOSIS — M810 Age-related osteoporosis without current pathological fracture: Secondary | ICD-10-CM

## 2019-09-02 MED ORDER — DENOSUMAB 60 MG/ML ~~LOC~~ SOSY
60.0000 mg | PREFILLED_SYRINGE | Freq: Once | SUBCUTANEOUS | Status: AC
  Administered 2019-09-02: 09:00:00 60 mg via SUBCUTANEOUS

## 2019-12-30 ENCOUNTER — Encounter: Payer: Self-pay | Admitting: Nurse Practitioner

## 2019-12-30 ENCOUNTER — Ambulatory Visit (INDEPENDENT_AMBULATORY_CARE_PROVIDER_SITE_OTHER): Payer: Medicare PPO | Admitting: Nurse Practitioner

## 2019-12-30 ENCOUNTER — Other Ambulatory Visit: Payer: Self-pay

## 2019-12-30 VITALS — BP 122/80 | HR 83 | Temp 96.8°F | Ht 68.0 in | Wt 214.0 lb

## 2019-12-30 DIAGNOSIS — Z66 Do not resuscitate: Secondary | ICD-10-CM | POA: Diagnosis not present

## 2019-12-30 DIAGNOSIS — E785 Hyperlipidemia, unspecified: Secondary | ICD-10-CM

## 2019-12-30 DIAGNOSIS — N1832 Chronic kidney disease, stage 3b: Secondary | ICD-10-CM

## 2019-12-30 DIAGNOSIS — E1121 Type 2 diabetes mellitus with diabetic nephropathy: Secondary | ICD-10-CM | POA: Diagnosis not present

## 2019-12-30 DIAGNOSIS — E1169 Type 2 diabetes mellitus with other specified complication: Secondary | ICD-10-CM

## 2019-12-30 DIAGNOSIS — E440 Moderate protein-calorie malnutrition: Secondary | ICD-10-CM

## 2019-12-30 DIAGNOSIS — Z86711 Personal history of pulmonary embolism: Secondary | ICD-10-CM

## 2019-12-30 DIAGNOSIS — I509 Heart failure, unspecified: Secondary | ICD-10-CM | POA: Diagnosis not present

## 2019-12-30 DIAGNOSIS — E034 Atrophy of thyroid (acquired): Secondary | ICD-10-CM

## 2019-12-30 DIAGNOSIS — N184 Chronic kidney disease, stage 4 (severe): Secondary | ICD-10-CM

## 2019-12-30 DIAGNOSIS — Z794 Long term (current) use of insulin: Secondary | ICD-10-CM

## 2019-12-30 DIAGNOSIS — M810 Age-related osteoporosis without current pathological fracture: Secondary | ICD-10-CM

## 2019-12-30 NOTE — Progress Notes (Signed)
  Careteam: Patient Care Team: Melissa Shannon, Melissa Shannon, Melissa Shannon as PCP - General (Nurse Practitioner) Melissa Shannon, Melissa Shannon, Melissa Shannon as PCP - Advanced Heart Failure (Cardiology) Wert, Michael B, Melissa Shannon as Consulting Physician (Pulmonary Disease) Melissa Shannon, Melissa Shannon, Melissa Shannon as Consulting Physician (Cardiology)  PLACE OF SERVICE:  [facility]  Advanced Directive information Does Patient Have a Medical Advance Directive?: Yes, Type of Advance Directive: Out of facility DNR (pink MOST or yellow form);Healthcare Power of Attorney;Living will, Pre-existing out of facility DNR order (yellow form or pink MOST form): Pink MOST/Yellow Form most recent copy in chart - Physician notified to receive inpatient order, Does patient want to make changes to medical advance directive?: No - Patient declined  Allergies  Allergen Reactions  . Lisinopril Shortness Of Breath    Tracheal Edema  . Penicillins Other (See Comments)    DID THE REACTION INVOLVE: Swelling of the face/tongue/throat, SOB, or low BP? Yes Sudden or severe rash/hives, skin peeling, or the inside of the mouth or nose? No Did it require medical treatment? No When did it last happen?more than 10 years  If all above answers are "NO", may proceed with cephalosporin use.   Throat felt tight    Chief Complaint  Patient presents with  . Medical Management of Chronic Issues    6 month follow-up  . Leg Problem    Patient states legs are a lot larger than they should be yet no one has ever addressed it.   . Advanced Directive    Re-acivate DNR order, per patient still active.   . Shortness of Breath    Patient c/o shortness of breath when active   . Quality Metric Gaps    Discuss need fot PNA, Foot Exam, and TD     HPI: Patient is a 86 y.o. female for routine follow up  Due for blood work- not fasting today.   Osteoporosis - not taking fosamax due to cr cl, taking prolia.   Obesity- pt with large legs, no fluid. Did not know her father so unsure about  his body type. Reports her grandmother was overweight.   Memory loss- continues on aricept 10 mg, continues with progressive memory loss but still able to do her bills and coordinate maintaining home. 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 Using tradjenta and levemir   hypothyroid due for follow up TSH today- taking synthroid 100 mcg daily without food.   A fib- no palpitations. No swelling. No abnormal brusing or bleeding  Reports she has shortness of breath with activity, she is very inactive and notice it has become worse with lack of activity during COVID Plans to get aid to help her increase her activity. No use of pillows to sleep, no LE edema.   Went to [place] for mothers day with her children. Did well. No complains today.  Does not wish to get TDAP or pneumonia shot. Not against vaccines just chooses not to get these.  Review of Systems:  Review of Systems  Constitutional: Negative for chills, fever and weight loss.  HENT: Negative for tinnitus.   Respiratory: Positive for shortness of breath (with increase in activity). Negative for cough and sputum production.   Cardiovascular: Negative for chest pain, palpitations and leg swelling.  Gastrointestinal: Negative for abdominal pain, constipation, diarrhea and heartburn.  Genitourinary: Negative for dysuria, frequency and urgency.  Musculoskeletal: Negative for back pain, falls, joint pain and myalgias.  Skin: Negative.     Neurological: Negative for dizziness and headaches.  Psychiatric/Behavioral: Negative for depression and memory loss. The patient does not have insomnia.     Past Medical History:  Diagnosis Date  . Arthritis    bilateral knees  . Chronic diastolic CHF (congestive heart failure) (HCC)   . CKD (chronic kidney disease), stage III   . Diabetes mellitus without complication (HCC)   . Does use hearing aid   . Edema   . Hyperlipidemia   .  Hypertension   . Hypothyroidism   . Pulmonary embolism (HCC)   . Urinary incontinence    Past Surgical History:  Procedure Laterality Date  . BLADDER SURGERY  2000   Dr.Tannerbaum (Bladder Tact)  . CATARACT EXTRACTION  03/2013   Dr.Shapiro  . CHOLECYSTECTOMY    . EYE SURGERY     bilateral cataracts  . PILONIDAL CYST EXCISION  1952   [facility]   . right elbow  1999   x 2, still has one piece of metal in it  . TONSILLECTOMY AND ADENOIDECTOMY  1937   Dr.Brewer   Social History:   reports that she has never smoked. She has never used smokeless tobacco. She reports that she does not drink alcohol or use drugs.  Family History  Problem Relation Age of Onset  . Suicidality Father   . Cancer Brother   . Cancer Maternal Grandfather   . Diabetes Maternal Grandmother   . Melanoma Brother     Medications: Patient'Shannon Medications  New Prescriptions   No medications on file  Previous Medications   AMLODIPINE (NORVASC) 5 MG TABLET    TAKE 1 TABLET BY MOUTH EVERY DAY   BD PEN NEEDLE NANO U/F 32G X 4 MM MISC    USE AS DIRECTED ONCE DAILY   CHOLECALCIFEROL (VITAMIN D3) 5000 UNITS CHEW    Chew 5,000 Units by mouth daily.    DONEPEZIL (ARICEPT) 10 MG TABLET    TAKE 1 TABLET BY MOUTH ONCE DAILY AT BEDTIME TO PRESERVE MEMORY   ELIQUIS 5 MG TABS TABLET    TAKE 1 TABLET BY MOUTH TWICE A DAY   FUROSEMIDE (LASIX) 20 MG TABLET    Take 1 tablet (20 mg total) by mouth daily. DX code I50.9   LEVEMIR FLEXTOUCH 100 UNIT/ML PEN    INJECT 24 UNITS INTO THE SKIN DAILY.   LEVOTHYROXINE (SYNTHROID) 100 MCG TABLET    TAKE 1 TABLET BY MOUTH EVERY DAY BEFORE BREAKFAST   MULTIPLE VITAMIN (MULTIVITAMIN WITH MINERALS) TABS TABLET    Take 1 tablet by mouth daily.   NUTRITIONAL SUPPLEMENTS (GLUCERNA SHAKE PO)    Take by mouth.   ONETOUCH DELICA LANCETS 33G MISC    USE EVERY OTHER DAY TO TEST BLOOD SUGAR   ONETOUCH ULTRA TEST STRIP    USE DAILY TO TEST BLOOD SUGAR. DX: E11.22   ROSUVASTATIN (CRESTOR) 40 MG  TABLET    TAKE 1 TABLET BY MOUTH EVERY DAY FOR CHOLESTEROL   TRADJENTA 5 MG TABS TABLET    TAKE 1 TABLET BY MOUTH EVERY DAY  Modified Medications   No medications on file  Discontinued Medications   ALENDRONATE (FOSAMAX) 70 MG TABLET    TAKE 1 TABLET BY MOUTH ONCE WEEKLY TAKE WITH FULL GLASS OF WATER ON AN EMPTY STOMACH    Physical Exam:  Vitals:   12/30/19 0928  BP: 122/80  Pulse: 83  Temp: (!) 96.8 F (36 C)  TempSrc: Temporal  SpO2: 96%  Weight: 214 lb (97.1 kg)  Height:   5' 8" (1.727 m)   Body mass index is 32.54 kg/m. Wt Readings from Last 3 Encounters:  12/30/19 214 lb (97.1 kg)  08/20/19 219 lb 12.8 oz (99.7 kg)  02/27/19 216 lb 9.6 oz (98.2 kg)    Physical Exam Constitutional:      General: She is not in acute distress.    Appearance: She is well-developed. She is not diaphoretic.  HENT:     Head: Normocephalic and atraumatic.     Mouth/Throat:     Pharynx: No oropharyngeal exudate.  Eyes:     Conjunctiva/sclera: Conjunctivae normal.     Pupils: Pupils are equal, round, and reactive to light.  Cardiovascular:     Rate and Rhythm: Normal rate and regular rhythm.     Pulses:          Dorsalis pedis pulses are 2+ on the right side and 2+ on the left side.       Posterior tibial pulses are 2+ on the right side and 2+ on the left side.     Heart sounds: Normal heart sounds.  Pulmonary:     Effort: Pulmonary effort is normal.     Breath sounds: Normal breath sounds.  Abdominal:     General: Bowel sounds are normal.     Palpations: Abdomen is soft.  Musculoskeletal:        General: No tenderness.     Cervical back: Normal range of motion and neck supple.     Right lower leg: No edema.     Left lower leg: No edema.  Feet:     Right foot:     Protective Sensation: 3 sites tested. 3 sites sensed.     Skin integrity: Skin integrity normal.     Toenail Condition: Right toenails are normal.     Left foot:     Protective Sensation: 3 sites tested. 3 sites  sensed.     Skin integrity: Skin integrity normal.     Toenail Condition: Left toenails are normal.  Skin:    General: Skin is warm and dry.  Neurological:     General: No focal deficit present.     Mental Status: She is alert and oriented to person, place, and time.  Psychiatric:        Mood and Affect: Mood normal.        Behavior: Behavior normal.     Labs reviewed: Basic Metabolic Panel: Recent Labs    06/24/19 0844 08/20/19 1233  NA 145 141  Shannon 3.8 4.4  CL 105 103  CO2 31 28  GLUCOSE 74 167*  BUN 25 23  CREATININE 1.18* 1.22*  CALCIUM 10.4 10.3   Liver Function Tests: Recent Labs    06/24/19 0844  AST 17  ALT 15  BILITOT 0.5  PROT 5.9*   No results for input(Shannon): LIPASE, AMYLASE in the last 8760 hours. No results for input(Shannon): AMMONIA in the last 8760 hours. CBC: No results for input(Shannon): WBC, NEUTROABS, HGB, HCT, MCV, PLT in the last 8760 hours. Lipid Panel: Recent Labs    06/24/19 0844  CHOL 167  HDL 66  LDLCALC 76  TRIG 156*  CHOLHDL 2.5   TSH: No results for input(Shannon): TSH in the last 8760 hours. A1C: Lab Results  Component Value Date   HGBA1C 6.7 (H) 06/24/2019     Assessment/Plan 1. DNR (do not resuscitate) - Do not attempt resuscitation (DNR)  2. Type 2 diabetes mellitus with stage 3b chronic kidney disease, with  long-term current use of insulin ([place]) -continues on levemir with tradjenta. Reports good home readings but does not have blood sugar log with her today.  Encouraged dietary compliance, routine foot care/monitoring and to keep up with diabetic eye exams through ophthalmology  - Hemoglobin A1c  3. Chronic congestive heart failure, unspecified heart failure type (HCC) -stable, without increase in DOE, she has been very inactive over the last year, no LE edema, or weight gain noted - CBC with Differential/Platelet - CMP with eGFR([facility])  4. History of pulmonary embolus (PE) Continues on eliquis 5 mg BID, no signs of recurrence.   - CBC with Differential/Platelet  5. Hyperlipidemia associated with type 2 diabetes mellitus ([place]) -not fasting today, LDL close to goal on last labs,  continues on crestor 40 mg daily  - CMP with eGFR([facility])  6. Malnutrition of moderate degree (HCC) -low protein despite obesity, proper diet discussed  - CMP with eGFR([facility])  7. Osteoporosis, unspecified osteoporosis type, unspecified pathological fracture presence Continues on prolia, cal and vit d  - CMP with eGFR([facility])  8. Hypothyroidism due to acquired atrophy of thyroid -continues on synthroid 100 mcg, due for follow up lab - TSH  9. CKD (chronic kidney disease) stage 4, GFR 15-29 ml/min (HCC) -improved GFR on last labs, Encourage proper hydration and to avoid NSAIDS (Aleve, Advil, Motrin, Ibuprofen)   Next appt: 6 months, with fasting labs prior to visit.  Carlos American. [place], [facility] [phone]

## 2019-12-31 ENCOUNTER — Other Ambulatory Visit: Payer: Self-pay | Admitting: Nurse Practitioner

## 2019-12-31 ENCOUNTER — Other Ambulatory Visit: Payer: Self-pay

## 2019-12-31 DIAGNOSIS — E034 Atrophy of thyroid (acquired): Secondary | ICD-10-CM

## 2019-12-31 LAB — COMPLETE METABOLIC PANEL WITH GFR
AG Ratio: 1.9 (calc) (ref 1.0–2.5)
ALT: 36 U/L — ABNORMAL HIGH (ref 6–29)
AST: 28 U/L (ref 10–35)
Albumin: 3.9 g/dL (ref 3.6–5.1)
Alkaline phosphatase (APISO): 53 U/L (ref 37–153)
BUN/Creatinine Ratio: 21 (calc) (ref 6–22)
BUN: 29 mg/dL — ABNORMAL HIGH (ref 7–25)
CO2: 31 mmol/L (ref 20–32)
Calcium: 10.2 mg/dL (ref 8.6–10.4)
Chloride: 105 mmol/L (ref 98–110)
Creat: 1.36 mg/dL — ABNORMAL HIGH (ref 0.60–0.88)
GFR, Est African American: 40 mL/min/{1.73_m2} — ABNORMAL LOW (ref >=60)
GFR, Est Non African American: 34 mL/min/{1.73_m2} — ABNORMAL LOW (ref >=60)
Globulin: 2.1 g/dL (calc) (ref 1.9–3.7)
Glucose, Bld: 143 mg/dL — ABNORMAL HIGH (ref 65–139)
Potassium: 4.1 mmol/L (ref 3.5–5.3)
Sodium: 143 mmol/L (ref 135–146)
Total Bilirubin: 0.7 mg/dL (ref 0.2–1.2)
Total Protein: 6 g/dL — ABNORMAL LOW (ref 6.1–8.1)

## 2019-12-31 LAB — CBC WITH DIFFERENTIAL/PLATELET
Absolute Monocytes: 636 cells/uL (ref 200–950)
Basophils Absolute: 59 cells/uL (ref 0–200)
Basophils Relative: 0.8 %
Eosinophils Absolute: 148 cells/uL (ref 15–500)
Eosinophils Relative: 2 %
HCT: 45.4 % — ABNORMAL HIGH (ref 35.0–45.0)
Hemoglobin: 14.4 g/dL (ref 11.7–15.5)
Lymphs Abs: 2042 cells/uL (ref 850–3900)
MCH: 28.6 pg (ref 27.0–33.0)
MCHC: 31.7 g/dL — ABNORMAL LOW (ref 32.0–36.0)
MCV: 90.1 fL (ref 80.0–100.0)
MPV: 12.2 fL (ref 7.5–12.5)
Monocytes Relative: 8.6 %
Neutro Abs: 4514 cells/uL (ref 1500–7800)
Neutrophils Relative %: 61 %
Platelets: 182 10*3/uL (ref 140–400)
RBC: 5.04 10*6/uL (ref 3.80–5.10)
RDW: 12.7 % (ref 11.0–15.0)
Total Lymphocyte: 27.6 %
WBC: 7.4 10*3/uL (ref 3.8–10.8)

## 2019-12-31 LAB — HEMOGLOBIN A1C
Hgb A1c MFr Bld: 6.3 % of total Hgb — ABNORMAL HIGH (ref <5.7)
Mean Plasma Glucose: 134 (calc)
eAG (mmol/L): 7.4 (calc)

## 2019-12-31 LAB — TSH: TSH: 0.33 mIU/L — ABNORMAL LOW (ref 0.40–4.50)

## 2019-12-31 MED ORDER — LEVOTHYROXINE SODIUM 88 MCG PO TABS: 88.0000 ug | ORAL_TABLET | Freq: Every day | ORAL | 0 refills | Status: DC

## 2020-01-03 ENCOUNTER — Other Ambulatory Visit: Payer: Self-pay | Admitting: Nurse Practitioner

## 2020-01-05 ENCOUNTER — Other Ambulatory Visit: Payer: Self-pay | Admitting: Nurse Practitioner

## 2020-01-05 DIAGNOSIS — I509 Heart failure, unspecified: Secondary | ICD-10-CM

## 2020-01-17 ENCOUNTER — Other Ambulatory Visit: Payer: Self-pay | Admitting: Nurse Practitioner

## 2020-02-21 ENCOUNTER — Other Ambulatory Visit: Payer: Self-pay | Admitting: Nurse Practitioner

## 2020-02-21 DIAGNOSIS — R413 Other amnesia: Secondary | ICD-10-CM

## 2020-02-29 ENCOUNTER — Other Ambulatory Visit: Payer: Self-pay | Admitting: Nurse Practitioner

## 2020-02-29 DIAGNOSIS — E034 Atrophy of thyroid (acquired): Secondary | ICD-10-CM

## 2020-03-01 ENCOUNTER — Other Ambulatory Visit: Payer: Self-pay | Admitting: Nurse Practitioner

## 2020-03-01 DIAGNOSIS — E785 Hyperlipidemia, unspecified: Secondary | ICD-10-CM

## 2020-03-02 ENCOUNTER — Other Ambulatory Visit: Payer: Medicare PPO

## 2020-03-02 ENCOUNTER — Ambulatory Visit (INDEPENDENT_AMBULATORY_CARE_PROVIDER_SITE_OTHER): Payer: Medicare PPO

## 2020-03-02 ENCOUNTER — Other Ambulatory Visit: Payer: Self-pay

## 2020-03-02 DIAGNOSIS — M810 Age-related osteoporosis without current pathological fracture: Secondary | ICD-10-CM

## 2020-03-02 DIAGNOSIS — E034 Atrophy of thyroid (acquired): Secondary | ICD-10-CM

## 2020-03-02 LAB — TSH: TSH: 1.11 mIU/L (ref 0.40–4.50)

## 2020-03-02 MED ORDER — DENOSUMAB 60 MG/ML ~~LOC~~ SOSY
60.0000 mg | PREFILLED_SYRINGE | Freq: Once | SUBCUTANEOUS | Status: AC
  Administered 2020-03-02: 60 mg via SUBCUTANEOUS

## 2020-03-03 ENCOUNTER — Encounter: Payer: Medicare PPO | Admitting: Nurse Practitioner

## 2020-03-06 ENCOUNTER — Ambulatory Visit (INDEPENDENT_AMBULATORY_CARE_PROVIDER_SITE_OTHER): Payer: Medicare PPO | Admitting: Nurse Practitioner

## 2020-03-06 ENCOUNTER — Other Ambulatory Visit: Payer: Self-pay

## 2020-03-06 ENCOUNTER — Encounter: Payer: Self-pay | Admitting: Nurse Practitioner

## 2020-03-06 ENCOUNTER — Telehealth: Payer: Self-pay

## 2020-03-06 DIAGNOSIS — Z0000 Encounter for general adult medical examination without abnormal findings: Secondary | ICD-10-CM

## 2020-03-06 NOTE — Telephone Encounter (Signed)
[name], rebel are scheduled for a virtual visit with your provider today.    Just as we do with appointments in the office, we must obtain your consent to participate.  Your consent will be active for this visit and any virtual visit you may have with one of our providers in the next 365 days.    If you have a MyChart account, I can also send a copy of this consent to you electronically.  All virtual visits are billed to your insurance company just like a traditional visit in the office.  As this is a virtual visit, video technology does not allow for your provider to perform a traditional examination.  This may limit your provider's ability to fully assess your condition.  If your provider identifies any concerns that need to be evaluated in person or the need to arrange testing such as labs, EKG, etc, we will make arrangements to do so.    Although advances in technology are sophisticated, we cannot ensure that it will always work on either your end or our end.  If the connection with a video visit is poor, we may have to switch to a telephone visit.  With either a video or telephone visit, we are not always able to ensure that we have a secure connection.   I need to obtain your verbal consent now.   Are you willing to proceed with your visit today?   Melissa Shannon has provided verbal consent on 03/06/2020 for a virtual visit (video or telephone).   [name] [place], [place] 03/06/2020  3:31 PM

## 2020-03-06 NOTE — Progress Notes (Signed)
Subjective:   Melissa Shannon is a 86 y.o. female who presents for Medicare Annual (Subsequent) preventive examination.  Review of Systems     Cardiac Risk Factors include: advanced age (>62men, >47 women);diabetes mellitus;hypertension;dyslipidemia;obesity (BMI >30kg/m2);sedentary lifestyle     Objective:    There were no vitals filed for this visit. There is no height or weight on file to calculate BMI.  Advanced Directives 03/06/2020 12/30/2019 02/27/2019 12/31/2018 10/01/2018 09/03/2018 09/03/2018  Does Patient Have a Medical Advance Directive? Yes Yes Yes Yes No Yes Yes  Type of Advance Directive Out of facility DNR (pink MOST or yellow form) Out of facility DNR (pink MOST or yellow form);[place];Living will Out of facility DNR (pink MOST or yellow form);Living will;Healthcare Power of [place];Living will - Out of facility DNR (pink MOST or yellow form) Out of facility DNR (pink MOST or yellow form)  Does patient want to make changes to medical advance directive? No - Patient declined No - Patient declined - No - Patient declined No - Patient declined - No - Patient declined  Copy of [place] in Chart? - No - copy requested Yes - validated most recent copy scanned in chart (See row information) Yes - validated most recent copy scanned in chart (See row information) - - -  Would patient like information on creating a medical advance directive? - - - - - - -  Pre-existing out of facility DNR order (yellow form or pink MOST form) - Pink MOST/Yellow Form most recent copy in chart - Physician notified to receive inpatient order - - - - -    Current Medications (verified) Outpatient Encounter Medications as of 03/06/2020  Medication Sig  . amLODipine (NORVASC) 5 MG tablet TAKE 1 TABLET BY MOUTH EVERY DAY  . BD PEN NEEDLE NANO U/F 32G X 4 MM MISC USE AS DIRECTED ONCE DAILY  . Cholecalciferol (VITAMIN D3) 5000 UNITS CHEW  Chew 5,000 Units by mouth daily.   Marland Kitchen donepezil (ARICEPT) 10 MG tablet TAKE 1 TABLET BY MOUTH EVERY DAY AT BEDTIME TO PRESERVE MEMORY  . ELIQUIS 5 MG TABS tablet TAKE 1 TABLET BY MOUTH TWICE A DAY  . furosemide (LASIX) 20 MG tablet Take 1 tablet (20 mg total) by mouth daily. DX I50.9  . LEVEMIR FLEXTOUCH 100 UNIT/ML Pen INJECT 24 UNITS INTO THE SKIN DAILY.  Marland Kitchen levothyroxine (SYNTHROID) 88 MCG tablet TAKE 1 TABLET (88 MCG TOTAL) BY MOUTH DAILY BEFORE BREAKFAST.  . Multiple Vitamin (MULTIVITAMIN WITH MINERALS) TABS tablet Take 1 tablet by mouth daily.  . Nutritional Supplements (GLUCERNA SHAKE PO) Take by mouth.  Glory Rosebush DELICA LANCETS 16R MISC USE EVERY OTHER DAY TO TEST BLOOD SUGAR  . ONETOUCH ULTRA test strip USE DAILY TO TEST BLOOD SUGAR. DX: E11.22  . rosuvastatin (CRESTOR) 40 MG tablet TAKE 1 TABLET BY MOUTH EVERY DAY FOR CHOLESTEROL  . TRADJENTA 5 MG TABS tablet TAKE 1 TABLET BY MOUTH EVERY DAY   No facility-administered encounter medications on file as of 03/06/2020.    Allergies (verified) Lisinopril and Penicillins   History: Past Medical History:  Diagnosis Date  . Arthritis    bilateral knees  . Chronic diastolic CHF (congestive heart failure) ([place])   . CKD (chronic kidney disease), stage III   . Diabetes mellitus without complication ([place])   . Does use hearing aid   . Edema   . Hyperlipidemia   . Hypertension   . Hypothyroidism   .  Pulmonary embolism ([place])   . Urinary incontinence    Past Surgical History:  Procedure Laterality Date  . BLADDER SURGERY  2000   Dr.Tannerbaum (Bladder Tact)  . CATARACT EXTRACTION  03/2013   Dr.Shapiro  . CHOLECYSTECTOMY    . EYE SURGERY     bilateral cataracts  . [facility]   . right elbow  1999   x 2, still has one piece of metal in it  . TONSILLECTOMY AND ADENOIDECTOMY  1937   Dr.Brewer   Family History  Problem Relation Age of Onset  . Suicidality Father   . Cancer Brother   .  Cancer Maternal Grandfather   . Diabetes Maternal Grandmother   . Melanoma Brother    Social History   Socioeconomic History  . Marital status: Widowed    Spouse name: Not on file  . Number of children: Not on file  . Years of education: Not on file  . Highest education level: Not on file  Occupational History  . Occupation: Retired Pharmacist, hospital  Tobacco Use  . Smoking status: Never Smoker  . Smokeless tobacco: Never Used  Vaping Use  . Vaping Use: Never used  Substance and Sexual Activity  . Alcohol use: No  . Drug use: No  . Sexual activity: Never  Other Topics Concern  . Not on file  Social History Narrative   Diet: Low sodium    Do you drink/eat things with caffeine? Yes, tea   Widowed, married 1952   Lives in a house, yes- 1 or more stories, 1 person in home. No pets    Current/past profession- Teacher    Patient exercises with walker twice daily        Social Determinants of Health   Financial Resource Strain:   . Difficulty of Paying Living Expenses:   Food Insecurity:   . Worried About Charity fundraiser in the Last Year:   . Arboriculturist in the Last Year:   Transportation Needs:   . Film/video editor (Medical):   Marland Kitchen Lack of Transportation (Non-Medical):   Physical Activity:   . Days of Exercise per Week:   . Minutes of Exercise per Session:   Stress:   . Feeling of Stress :   Social Connections:   . Frequency of Communication with Friends and Family:   . Frequency of Social Gatherings with Friends and Family:   . Attends Religious Services:   . Active Member of Clubs or Organizations:   . Attends Archivist Meetings:   Marland Kitchen Marital Status:     Tobacco Counseling Counseling given: Not Answered   Clinical Intake:  Pre-visit preparation completed: Yes  Pain : No/denies pain     BMI - recorded: 32.54 Diabetes: No  How often do you need to have someone help you when you read instructions, pamphlets, or other written materials from  your doctor or pharmacy?: 1 - Never  Diabetic?yes         Activities of Daily Living In your present state of health, do you have any difficulty performing the following activities: 03/06/2020  Hearing? Y  Vision? N  Difficulty concentrating or making decisions? N  Walking or climbing stairs? Y  Comment using walker  Dressing or bathing? N  Preparing Food and eating ? N  Using the Toilet? N  In the past six months, have you accidently leaked urine? Y  Do you have problems with loss of  bowel control? N  Managing your Medications? N  Managing your Finances? N  Housekeeping or managing your Housekeeping? N  Some recent data might be hidden    Patient Care Team: Lauree Chandler, NP as PCP - General (Nurse Practitioner) Larey Dresser, MD as PCP - Advanced Heart Failure (Cardiology) Tanda Rockers, MD as Consulting Physician (Pulmonary Disease) Larey Dresser, MD as Consulting Physician (Cardiology)  Indicate any recent Medical Services you may have received from other than [facility] providers in the past year (date may be approximate).     Assessment:   This is a routine wellness examination for [place].  Hearing/Vision screen  Hearing Screening   125Hz  250Hz  500Hz  1000Hz  2000Hz  3000Hz  4000Hz  6000Hz  8000Hz   Right ear:           Left ear:           Comments: Wears hearing aids,per patient they do not help. Seen audiologist on Tuesday 7/20/201  Vision Screening Comments: Last eye exam less than 12 months ago  Dietary issues and exercise activities discussed: Current Exercise Habits: The patient does not participate in regular exercise at present  Goals    . Maintain Lifestyle     Patient will continue to watch salt intake, water intake, and walking      Depression Screen PHQ 2/9 Scores 03/06/2020 07/01/2019 02/27/2019 02/23/2018 02/23/2018 02/20/2017 02/17/2017  PHQ - 2 Score 0 0 0 0 0 0 0    Fall Risk Fall Risk  03/06/2020 12/30/2019 07/01/2019 02/27/2019 01/01/2019    Falls in the past year? 0 0 0 0 0  Number falls in past yr: 0 0 0 0 0  Injury with Fall? 0 0 0 0 0    Any stairs in or around the home? Yes  If so, are there any without handrails? No  Home free of loose throw rugs in walkways, pet beds, electrical cords, etc? Yes  Adequate lighting in your home to reduce risk of falls? Yes   ASSISTIVE DEVICES UTILIZED TO PREVENT FALLS:  Life alert? No  Use of a cane, walker or w/c? Yes  Grab bars in the bathroom? No  Shower chair or bench in shower? No  Elevated toilet seat or a handicapped toilet? No   TIMED UP AND GO: na   Cognitive Function: MMSE - Mini Mental State Exam 02/27/2019 02/23/2018 02/17/2017 02/11/2016 09/04/2014  Orientation to time 5 5 5 5 5   Orientation to Place 5 3 4 5 5   Registration 3 3 3 3 3   Attention/ Calculation 5 5 5 5 5   Recall 3 2 1 2 3   Language- name 2 objects 2 2 2 2 2   Language- repeat 1 1 1 1 1   Language- follow 3 step command 3 3 2 3 3   Language- read & follow direction 1 1 1 1 1   Write a sentence 1 1 1 1 1   Copy design 1 1 1 1 1   Total score 30 27 26 29 30      6CIT Screen 03/06/2020  What Year? 0 points  What month? 0 points  What time? 0 points  Count back from 20 0 points  Months in reverse 0 points  Repeat phrase 2 points  Total Score 2    Immunizations Immunization History  Administered Date(s) Administered  . PFIZER SARS-COV-2 Vaccination 09/21/2019, 10/14/2019  . Pneumococcal Polysaccharide-23 04/06/2009  . Td 04/06/2009    TDAP status: Due, Education has been provided regarding the importance of this vaccine.  Advised may receive this vaccine at local pharmacy or Health Dept. Aware to provide a copy of the vaccination record if obtained from local pharmacy or Health Dept. Verbalized acceptance and understanding. Flu Vaccine status: Declined, Education has been provided regarding the importance of this vaccine but patient still declined. Advised may receive this vaccine at local pharmacy or  Health Dept. Aware to provide a copy of the vaccination record if obtained from local pharmacy or Health Dept. Verbalized acceptance and understanding. Pneumococcal vaccine status: Declined,  Education has been provided regarding the importance of this vaccine but patient still declined. Advised may receive this vaccine at local pharmacy or Health Dept. Aware to provide a copy of the vaccination record if obtained from local pharmacy or Health Dept. Verbalized acceptance and understanding.  Covid-19 vaccine status: Completed vaccines  Qualifies for Shingles Vaccine? Yes   Zostavax completed No   Shingrix Completed?: No.    Education has been provided regarding the importance of this vaccine. Patient has been advised to call insurance company to determine out of pocket expense if they have not yet received this vaccine. Advised may also receive vaccine at local pharmacy or Health Dept. Verbalized acceptance and understanding.  Screening Tests Health Maintenance  Topic Date Due  . URINE MICROALBUMIN  01/01/2020  . TETANUS/TDAP  12/29/2020 (Originally 04/07/2019)  . PNA vac Low Risk Adult (2 of 2 - PCV13) 12/29/2020 (Originally 04/06/2010)  . INFLUENZA VACCINE  08/15/2048 (Originally 03/15/2020)  . HEMOGLOBIN A1C  07/01/2020  . OPHTHALMOLOGY EXAM  07/07/2020  . FOOT EXAM  12/29/2020  . DEXA SCAN  Completed  . COVID-19 Vaccine  Completed    Health Maintenance  Health Maintenance Due  Topic Date Due  . URINE MICROALBUMIN  01/01/2020    Colorectal cancer screening: No longer required.  Mammogram status: No longer required.  Bone Density status: Completed 2020. Results reflect: Bone density results: OSTEOPOROSIS. Repeat every 2 years.  Lung Cancer Screening: (Low Dose CT Chest recommended if Age 1-80 years, 30 pack-year currently smoking OR have quit w/in 15years.) does not qualify.   Lung Cancer Screening Referral: na  Additional Screening:  Hepatitis C Screening: does not qualify;  Completed na  Vision Screening: Recommended annual ophthalmology exams for early detection of glaucoma and other disorders of the eye. Is the patient up to date with their annual eye exam?  Yes  Who is the provider or what is the name of the office in which the patient attends annual eye exams? [facility] If pt is not established with a provider, would they like to be referred to a provider to establish care? No .   Dental Screening: Recommended annual dental exams for proper oral hygiene  Community Resource Referral / Chronic Care Management: CRR required this visit?  No   CCM required this visit?  No      Plan:     I have personally reviewed and noted the following in the patient's chart:   . Medical and social history . Use of alcohol, tobacco or illicit drugs  . Current medications and supplements . Functional ability and status . Nutritional status . Physical activity . Advanced directives . List of other physicians . Hospitalizations, surgeries, and ER visits in previous 12 months . Vitals . Screenings to include cognitive, depression, and falls . Referrals and appointments  In addition, I have reviewed and discussed with patient certain preventive protocols, quality metrics, and best practice recommendations. A written personalized care plan for preventive services as well  as general preventive health recommendations were provided to patient.     Lauree Chandler, NP   03/06/2020

## 2020-03-06 NOTE — Patient Instructions (Signed)
Melissa Shannon , Thank you for taking time to come for your Medicare Wellness Visit. I appreciate your ongoing commitment to your health goals. Please review the following plan we discussed and let me know if I can assist you in the future.   Screening recommendations/referrals: Colonoscopy aged out Mammogram aged out Bone Density up to date Recommended yearly ophthalmology/optometry visit for glaucoma screening and checkup Recommended yearly dental visit for hygiene and checkup  Vaccinations: Influenza vaccine you have declined this. Pneumococcal vaccine you have declined this Tdap vaccine you have declined this Shingles vaccine you have declined this    Advanced directives: recommended to place on file.   Conditions/risks identified: obesity, diabetes, advance age, hypertension, hyperlipidemia  Next appointment: 06/29/2020 and 1 year for AWV    Preventive Care 26 Years and Older, Female Preventive care refers to lifestyle choices and visits with your health care provider that can promote health and wellness. What does preventive care include?  A yearly physical exam. This is also called an annual well check.  Dental exams once or twice a year.  Routine eye exams. Ask your health care provider how often you should have your eyes checked.  Personal lifestyle choices, including:  Daily care of your teeth and gums.  Regular physical activity.  Eating a healthy diet.  Avoiding tobacco and drug use.  Limiting alcohol use.  Practicing safe sex.  Taking low-dose aspirin every day.  Taking vitamin and mineral supplements as recommended by your health care provider. What happens during an annual well check? The services and screenings done by your health care provider during your annual well check will depend on your age, overall health, lifestyle risk factors, and family history of disease. Counseling  Your health care provider may ask you questions about your:  Alcohol  use.  Tobacco use.  Drug use.  Emotional well-being.  Home and relationship well-being.  Sexual activity.  Eating habits.  History of falls.  Memory and ability to understand (cognition).  Work and work Statistician.  Reproductive health. Screening  You may have the following tests or measurements:  Height, weight, and BMI.  Blood pressure.  Lipid and cholesterol levels. These may be checked every 5 years, or more frequently if you are over 4 years old.  Skin check.  Lung cancer screening. You may have this screening every year starting at age 14 if you have a 30-pack-year history of smoking and currently smoke or have quit within the past 15 years.  Fecal occult blood test (FOBT) of the stool. You may have this test every year starting at age 18.  Flexible sigmoidoscopy or colonoscopy. You may have a sigmoidoscopy every 5 years or a colonoscopy every 10 years starting at age 32.  Hepatitis C blood test.  Hepatitis B blood test.  Sexually transmitted disease (STD) testing.  Diabetes screening. This is done by checking your blood sugar (glucose) after you have not eaten for a while (fasting). You may have this done every 1-3 years.  Bone density scan. This is done to screen for osteoporosis. You may have this done starting at age 81.  Mammogram. This may be done every 1-2 years. Talk to your health care provider about how often you should have regular mammograms. Talk with your health care provider about your test results, treatment options, and if necessary, the need for more tests. Vaccines  Your health care provider may recommend certain vaccines, such as:  Influenza vaccine. This is recommended every year.  Tetanus, diphtheria,  and acellular pertussis (Tdap, Td) vaccine. You may need a Td booster every 10 years.  Zoster vaccine. You may need this after age 81.  Pneumococcal 13-valent conjugate (PCV13) vaccine. One dose is recommended after age  29.  Pneumococcal polysaccharide (PPSV23) vaccine. One dose is recommended after age 35. Talk to your health care provider about which screenings and vaccines you need and how often you need them. This information is not intended to replace advice given to you by your health care provider. Make sure you discuss any questions you have with your health care provider. Document Released: 08/28/2015 Document Revised: 04/20/2016 Document Reviewed: 06/02/2015 Elsevier Interactive Patient Education  2017 [employer] Prevention in the Home Falls can cause injuries. They can happen to people of all ages. There are many things you can do to make your home safe and to help prevent falls. What can I do on the outside of my home?  Regularly fix the edges of walkways and driveways and fix any cracks.  Remove anything that might make you trip as you walk through a door, such as a raised step or threshold.  Trim any bushes or trees on the path to your home.  Use bright outdoor lighting.  Clear any walking paths of anything that might make someone trip, such as rocks or tools.  Regularly check to see if handrails are loose or broken. Make sure that both sides of any steps have handrails.  Any raised decks and porches should have guardrails on the edges.  Have any leaves, snow, or ice cleared regularly.  Use sand or salt on walking paths during winter.  Clean up any spills in your garage right away. This includes oil or grease spills. What can I do in the bathroom?  Use night lights.  Install grab bars by the toilet and in the tub and shower. Do not use towel bars as grab bars.  Use non-skid mats or decals in the tub or shower.  If you need to sit down in the shower, use a plastic, non-slip stool.  Keep the floor dry. Clean up any water that spills on the floor as soon as it happens.  Remove soap buildup in the tub or shower regularly.  Attach bath mats securely with double-sided  non-slip rug tape.  Do not have throw rugs and other things on the floor that can make you trip. What can I do in the bedroom?  Use night lights.  Make sure that you have a light by your bed that is easy to reach.  Do not use any sheets or blankets that are too big for your bed. They should not hang down onto the floor.  Have a firm chair that has side arms. You can use this for support while you get dressed.  Do not have throw rugs and other things on the floor that can make you trip. What can I do in the kitchen?  Clean up any spills right away.  Avoid walking on wet floors.  Keep items that you use a lot in easy-to-reach places.  If you need to reach something above you, use a strong step stool that has a grab bar.  Keep electrical cords out of the way.  Do not use floor polish or wax that makes floors slippery. If you must use wax, use non-skid floor wax.  Do not have throw rugs and other things on the floor that can make you trip. What can I do with my  stairs?  Do not leave any items on the stairs.  Make sure that there are handrails on both sides of the stairs and use them. Fix handrails that are broken or loose. Make sure that handrails are as long as the stairways.  Check any carpeting to make sure that it is firmly attached to the stairs. Fix any carpet that is loose or worn.  Avoid having throw rugs at the top or bottom of the stairs. If you do have throw rugs, attach them to the floor with carpet tape.  Make sure that you have a light switch at the top of the stairs and the bottom of the stairs. If you do not have them, ask someone to add them for you. What else can I do to help prevent falls?  Wear shoes that:  Do not have high heels.  Have rubber bottoms.  Are comfortable and fit you well.  Are closed at the toe. Do not wear sandals.  If you use a stepladder:  Make sure that it is fully opened. Do not climb a closed stepladder.  Make sure that both  sides of the stepladder are locked into place.  Ask someone to hold it for you, if possible.  Clearly mark and make sure that you can see:  Any grab bars or handrails.  First and last steps.  Where the edge of each step is.  Use tools that help you move around (mobility aids) if they are needed. These include:  Canes.  Walkers.  Scooters.  Crutches.  Turn on the lights when you go into a dark area. Replace any light bulbs as soon as they burn out.  Set up your furniture so you have a clear path. Avoid moving your furniture around.  If any of your floors are uneven, fix them.  If there are any pets around you, be aware of where they are.  Review your medicines with your doctor. Some medicines can make you feel dizzy. This can increase your chance of falling. Ask your doctor what other things that you can do to help prevent falls. This information is not intended to replace advice given to you by your health care provider. Make sure you discuss any questions you have with your health care provider. Document Released: 05/28/2009 Document Revised: 01/07/2016 Document Reviewed: 09/05/2014 Elsevier Interactive Patient Education  2017 [employer].

## 2020-03-06 NOTE — Progress Notes (Signed)
   This service is provided via telemedicine  No vital signs collected/recorded due to the encounter was a telemedicine visit.   Location of patient (ex: home, work):  [employer] [facility], Office   Patient consents to a telephone visit:  Yes, see telephone encounter dated with annual consent   Location of the provider (ex: office, home):  [employer] [facility], Office   Name of any referring provider:  N/a  Names of all persons participating in the telemedicine service and their role in the encounter:  S.Chrae B/CMA, Sherrie Mustache, NP, and Patient   Time spent on call:  13 min with medical assistant

## 2020-04-05 ENCOUNTER — Other Ambulatory Visit: Payer: Self-pay | Admitting: Nurse Practitioner

## 2020-04-05 DIAGNOSIS — E034 Atrophy of thyroid (acquired): Secondary | ICD-10-CM

## 2020-04-06 NOTE — Telephone Encounter (Addendum)
From 12/30/19 note.  hypothyroid due for follow up TSH today- taking synthroid 100 mcg daily without food.   On 03/02/20 Synthroid 88 mcg was filled.   No note about change in dosing.  Last TSH stated she was at goal, which was done 03/02/20.   What dosing is correct?

## 2020-05-16 ENCOUNTER — Other Ambulatory Visit: Payer: Self-pay | Admitting: Nurse Practitioner

## 2020-05-20 ENCOUNTER — Other Ambulatory Visit: Payer: Self-pay | Admitting: Nurse Practitioner

## 2020-06-29 ENCOUNTER — Other Ambulatory Visit: Payer: Self-pay

## 2020-06-29 ENCOUNTER — Other Ambulatory Visit: Payer: Medicare PPO | Admitting: Nurse Practitioner

## 2020-06-29 ENCOUNTER — Other Ambulatory Visit: Payer: Medicare PPO

## 2020-06-29 DIAGNOSIS — E1169 Type 2 diabetes mellitus with other specified complication: Secondary | ICD-10-CM

## 2020-06-29 DIAGNOSIS — I10 Essential (primary) hypertension: Secondary | ICD-10-CM

## 2020-06-29 DIAGNOSIS — N183 Chronic kidney disease, stage 3 unspecified: Secondary | ICD-10-CM

## 2020-06-29 DIAGNOSIS — Z794 Long term (current) use of insulin: Secondary | ICD-10-CM

## 2020-06-29 DIAGNOSIS — E785 Hyperlipidemia, unspecified: Secondary | ICD-10-CM

## 2020-06-30 LAB — LIPID PANEL
Cholesterol: 144 mg/dL (ref <200)
HDL: 65 mg/dL (ref >=50)
LDL Cholesterol (Calc): 55 mg/dL (calc)
Non-HDL Cholesterol (Calc): 79 mg/dL (calc) (ref <130)
Total CHOL/HDL Ratio: 2.2 (calc) (ref <5.0)
Triglycerides: 164 mg/dL — ABNORMAL HIGH (ref <150)

## 2020-06-30 LAB — HEMOGLOBIN A1C
Hgb A1c MFr Bld: 6.8 % of total Hgb — ABNORMAL HIGH (ref <5.7)
Mean Plasma Glucose: 148 (calc)
eAG (mmol/L): 8.2 (calc)

## 2020-06-30 LAB — CBC WITH DIFFERENTIAL/PLATELET
Absolute Monocytes: 740 cells/uL (ref 200–950)
Basophils Absolute: 52 cells/uL (ref 0–200)
Basophils Relative: 0.6 %
Eosinophils Absolute: 112 cells/uL (ref 15–500)
Eosinophils Relative: 1.3 %
HCT: 43 % (ref 35.0–45.0)
Hemoglobin: 13.8 g/dL (ref 11.7–15.5)
Lymphs Abs: 2184 cells/uL (ref 850–3900)
MCH: 29.4 pg (ref 27.0–33.0)
MCHC: 32.1 g/dL (ref 32.0–36.0)
MCV: 91.5 fL (ref 80.0–100.0)
MPV: 12 fL (ref 7.5–12.5)
Monocytes Relative: 8.6 %
Neutro Abs: 5513 cells/uL (ref 1500–7800)
Neutrophils Relative %: 64.1 %
Platelets: 223 10*3/uL (ref 140–400)
RBC: 4.7 10*6/uL (ref 3.80–5.10)
RDW: 12.5 % (ref 11.0–15.0)
Total Lymphocyte: 25.4 %
WBC: 8.6 10*3/uL (ref 3.8–10.8)

## 2020-06-30 LAB — COMPLETE METABOLIC PANEL WITH GFR
AG Ratio: 1.7 (calc) (ref 1.0–2.5)
ALT: 22 U/L (ref 6–29)
AST: 21 U/L (ref 10–35)
Albumin: 4 g/dL (ref 3.6–5.1)
Alkaline phosphatase (APISO): 51 U/L (ref 37–153)
BUN/Creatinine Ratio: 26 (calc) — ABNORMAL HIGH (ref 6–22)
BUN: 39 mg/dL — ABNORMAL HIGH (ref 7–25)
CO2: 29 mmol/L (ref 20–32)
Calcium: 10.3 mg/dL (ref 8.6–10.4)
Chloride: 105 mmol/L (ref 98–110)
Creat: 1.49 mg/dL — ABNORMAL HIGH (ref 0.60–0.88)
GFR, Est African American: 35 mL/min/{1.73_m2} — ABNORMAL LOW (ref >=60)
GFR, Est Non African American: 31 mL/min/{1.73_m2} — ABNORMAL LOW (ref >=60)
Globulin: 2.4 g/dL (calc) (ref 1.9–3.7)
Glucose, Bld: 99 mg/dL (ref 65–139)
Potassium: 4.1 mmol/L (ref 3.5–5.3)
Sodium: 144 mmol/L (ref 135–146)
Total Bilirubin: 0.4 mg/dL (ref 0.2–1.2)
Total Protein: 6.4 g/dL (ref 6.1–8.1)

## 2020-07-01 LAB — MICROALBUMIN / CREATININE URINE RATIO
Creatinine, Urine: 139 mg/dL (ref 20–275)
Microalb Creat Ratio: 14 mcg/mg creat (ref <30)
Microalb, Ur: 2 mg/dL

## 2020-07-02 ENCOUNTER — Other Ambulatory Visit: Payer: Self-pay | Admitting: Nurse Practitioner

## 2020-07-03 ENCOUNTER — Encounter: Payer: Self-pay | Admitting: Nurse Practitioner

## 2020-07-03 ENCOUNTER — Other Ambulatory Visit: Payer: Self-pay

## 2020-07-03 ENCOUNTER — Ambulatory Visit (INDEPENDENT_AMBULATORY_CARE_PROVIDER_SITE_OTHER): Payer: Medicare PPO | Admitting: Nurse Practitioner

## 2020-07-03 VITALS — BP 130/80 | HR 75 | Temp 96.7°F | Ht 68.0 in | Wt 217.6 lb

## 2020-07-03 DIAGNOSIS — N1832 Chronic kidney disease, stage 3b: Secondary | ICD-10-CM

## 2020-07-03 DIAGNOSIS — E1169 Type 2 diabetes mellitus with other specified complication: Secondary | ICD-10-CM

## 2020-07-03 DIAGNOSIS — Z794 Long term (current) use of insulin: Secondary | ICD-10-CM

## 2020-07-03 DIAGNOSIS — I10 Essential (primary) hypertension: Secondary | ICD-10-CM

## 2020-07-03 DIAGNOSIS — M810 Age-related osteoporosis without current pathological fracture: Secondary | ICD-10-CM

## 2020-07-03 DIAGNOSIS — R0600 Dyspnea, unspecified: Secondary | ICD-10-CM

## 2020-07-03 DIAGNOSIS — E785 Hyperlipidemia, unspecified: Secondary | ICD-10-CM

## 2020-07-03 DIAGNOSIS — R0609 Other forms of dyspnea: Secondary | ICD-10-CM

## 2020-07-03 DIAGNOSIS — E034 Atrophy of thyroid (acquired): Secondary | ICD-10-CM

## 2020-07-03 DIAGNOSIS — E1122 Type 2 diabetes mellitus with diabetic chronic kidney disease: Secondary | ICD-10-CM | POA: Diagnosis not present

## 2020-07-03 DIAGNOSIS — I509 Heart failure, unspecified: Secondary | ICD-10-CM

## 2020-07-03 DIAGNOSIS — N183 Chronic kidney disease, stage 3 unspecified: Secondary | ICD-10-CM

## 2020-07-03 NOTE — Patient Instructions (Signed)
Increase lasix to 40 mg by mouth daily for the next 4 days to see if this helps with shortness of breath  Call cardiology to set up follow up appt

## 2020-07-03 NOTE — Progress Notes (Signed)
Careteam: Patient Care Team: Lauree Chandler, NP as PCP - General (Nurse Practitioner) Larey Dresser, MD as PCP - Advanced Heart Failure (Cardiology) Tanda Rockers, MD as Consulting Physician (Pulmonary Disease) Larey Dresser, MD as Consulting Physician (Cardiology)  PLACE OF SERVICE:  [place] Directive information Does Patient Have a Medical Advance Directive?: Yes, Type of Advance Directive: Out of facility DNR (pink MOST or yellow form);[place];Living will, Does patient want to make changes to medical advance directive?: No - Patient declined  Allergies  Allergen Reactions  . Lisinopril Shortness Of Breath    Tracheal Edema  . Penicillins Other (See Comments)    DID THE REACTION INVOLVE: Swelling of the face/tongue/throat, SOB, or low BP? Yes Sudden or severe rash/hives, skin peeling, or the inside of the mouth or nose? No Did it require medical treatment? No When did it last happen?more than 10 years  If all above answers are "NO", may proceed with cephalosporin use.   Throat felt tight    Chief Complaint  Patient presents with  . Medical Management of Chronic Issues    6 month follow up. Patient states that she is out of breath, wondering if she needs oxygen. Shortness of breath on exertion.     HPI: Patient is a 86 y.o. female for follow up.  Reports generally she goes out 2 times a week. She has been out every day this week. Mostly sedentary.   Hypothyroid-tsh at goal in July  CHF- unable to stand up to make gravy due to shortness of breath, feels like her legs are swollen. Continues on lasix 20 mg daily   Hyperlipidemia- continues on crestor 40 mg daily, LDL at goal.   Memory loss- unable to recall names, continues on aricept 10 mg daily,   DM- controlled on tradjenta and levemir 25 units  Some tightness to feet.    Review of Systems:  Review of Systems  Constitutional: Negative for chills, fever  and weight loss.  HENT: Negative for tinnitus.   Respiratory: Positive for shortness of breath. Negative for cough and sputum production.   Cardiovascular: Negative for chest pain, palpitations and leg swelling.  Gastrointestinal: Negative for abdominal pain, constipation, diarrhea and heartburn.  Genitourinary: Negative for dysuria, frequency and urgency.  Musculoskeletal: Positive for joint pain. Negative for back pain, falls and myalgias.  Skin: Negative.   Neurological: Negative for dizziness and headaches.  Psychiatric/Behavioral: Positive for memory loss. Negative for depression. The patient does not have insomnia.     Past Medical History:  Diagnosis Date  . Arthritis    bilateral knees  . Chronic diastolic CHF (congestive heart failure) ([place])   . CKD (chronic kidney disease), stage III ([place])   . Diabetes mellitus without complication ([place])   . Does use hearing aid   . Edema   . Hyperlipidemia   . Hypertension   . Hypothyroidism   . Pulmonary embolism ([place])   . Urinary incontinence    Past Surgical History:  Procedure Laterality Date  . BLADDER SURGERY  2000   Dr.Tannerbaum (Bladder Tact)  . CATARACT EXTRACTION  03/2013   Dr.Shapiro  . CHOLECYSTECTOMY    . EYE SURGERY     bilateral cataracts  . [facility]   . right elbow  1999   x 2, still has one piece of metal in it  . TONSILLECTOMY AND ADENOIDECTOMY  1937   Dr.Brewer  Social History:   reports that she has never smoked. She has never used smokeless tobacco. She reports that she does not drink alcohol and does not use drugs.  Family History  Problem Relation Age of Onset  . Suicidality Father   . Cancer Brother   . Cancer Maternal Grandfather   . Diabetes Maternal Grandmother   . Melanoma Brother     Medications: Patient's Medications  New Prescriptions   No medications on file  Previous Medications   AMLODIPINE (NORVASC) 5 MG TABLET    TAKE 1 TABLET BY MOUTH  EVERY DAY   BD PEN NEEDLE NANO 2ND GEN 32G X 4 MM MISC    USE AS DIRECTED ONCE DAILY   CHOLECALCIFEROL (VITAMIN D3) 5000 UNITS CHEW    Chew 5,000 Units by mouth daily.    DONEPEZIL (ARICEPT) 10 MG TABLET    TAKE 1 TABLET BY MOUTH EVERY DAY AT BEDTIME TO PRESERVE MEMORY   ELIQUIS 5 MG TABS TABLET    TAKE 1 TABLET BY MOUTH TWICE A DAY   FUROSEMIDE (LASIX) 20 MG TABLET    Take 1 tablet (20 mg total) by mouth daily. DX I50.9   LEVEMIR FLEXTOUCH 100 UNIT/ML PEN    INJECT 24 UNITS INTO THE SKIN DAILY.   LEVOTHYROXINE (SYNTHROID) 88 MCG TABLET    TAKE 1 TABLET (88 MCG TOTAL) BY MOUTH DAILY BEFORE BREAKFAST.   MULTIPLE VITAMIN (MULTIVITAMIN WITH MINERALS) TABS TABLET    Take 1 tablet by mouth daily.   NUTRITIONAL SUPPLEMENTS (GLUCERNA SHAKE PO)    Take by mouth.   ONETOUCH DELICA LANCETS 32T MISC    USE EVERY OTHER DAY TO TEST BLOOD SUGAR   ONETOUCH ULTRA TEST STRIP    USE DAILY TO TEST BLOOD SUGAR. DX: E11.22   ROSUVASTATIN (CRESTOR) 40 MG TABLET    TAKE 1 TABLET BY MOUTH EVERY DAY FOR CHOLESTEROL   TRADJENTA 5 MG TABS TABLET    TAKE 1 TABLET BY MOUTH EVERY DAY  Modified Medications   No medications on file  Discontinued Medications   No medications on file    Physical Exam:  Vitals:   07/03/20 0922  BP: 130/80  Pulse: 75  Temp: (!) 96.7 F (35.9 C)  SpO2: 97%  Weight: 217 lb 9.6 oz (98.7 kg)  Height: 5\' 8"  (1.727 m)   Body mass index is 33.09 kg/m. Wt Readings from Last 3 Encounters:  07/03/20 217 lb 9.6 oz (98.7 kg)  12/30/19 214 lb (97.1 kg)  08/20/19 219 lb 12.8 oz (99.7 kg)    Physical Exam Constitutional:      General: She is not in acute distress.    Appearance: She is well-developed. She is not diaphoretic.  HENT:     Head: Normocephalic and atraumatic.     Mouth/Throat:     Pharynx: No oropharyngeal exudate.  Eyes:     Conjunctiva/sclera: Conjunctivae normal.     Pupils: Pupils are equal, round, and reactive to light.  Cardiovascular:     Rate and Rhythm: Normal  rate and regular rhythm.     Heart sounds: Normal heart sounds.  Pulmonary:     Effort: Pulmonary effort is normal.     Breath sounds: Normal breath sounds.  Abdominal:     General: Bowel sounds are normal.     Palpations: Abdomen is soft.  Musculoskeletal:        General: No tenderness.     Cervical back: Normal range of motion and neck supple.  Right lower leg: No edema.     Left lower leg: No edema.  Skin:    General: Skin is warm and dry.  Neurological:     Mental Status: She is alert and oriented to person, place, and time.     Labs reviewed: Basic Metabolic Panel: Recent Labs    08/20/19 1233 12/30/19 1007 03/02/20 0946 06/29/20 0000  NA 141 143  --  144  K 4.4 4.1  --  4.1  CL 103 105  --  105  CO2 28 31  --  29  GLUCOSE 167* 143*  --  99  BUN 23 29*  --  39*  CREATININE 1.22* 1.36*  --  1.49*  CALCIUM 10.3 10.2  --  10.3  TSH  --  0.33* 1.11  --    Liver Function Tests: Recent Labs    12/30/19 1007 06/29/20 0000  AST 28 21  ALT 36* 22  BILITOT 0.7 0.4  PROT 6.0* 6.4   No results for input(s): LIPASE, AMYLASE in the last 8760 hours. No results for input(s): AMMONIA in the last 8760 hours. CBC: Recent Labs    12/30/19 1007 06/29/20 0000  WBC 7.4 8.6  NEUTROABS 4,514 5,513  HGB 14.4 13.8  HCT 45.4* 43.0  MCV 90.1 91.5  PLT 182 223   Lipid Panel: Recent Labs    06/29/20 0000  CHOL 144  HDL 65  LDLCALC 55  TRIG 164*  CHOLHDL 2.2   TSH: Recent Labs    12/30/19 1007 03/02/20 0946  TSH 0.33* 1.11   A1C: Lab Results  Component Value Date   HGBA1C 6.8 (H) 06/29/2020     Assessment/Plan 1. Type 2 diabetes mellitus with stage 3b chronic kidney disease, with long-term current use of insulin (HCC) A1c stable on tradjenta and levemir 24 units, denies hypoglycemia. Encouraged dietary compliance, routine foot care/monitoring and to keep up with diabetic eye exams through ophthalmology   2. Hyperlipidemia associated with type 2  diabetes mellitus (HCC) LDL at goal on crestor 40 mg daily and dietary modifications encouraged.   3. Stage 3 chronic kidney disease, unspecified whether stage 3a or 3b CKD (HCC) Stable, Encourage proper hydration and to avoid NSAIDS (Aleve, Advil, Motrin, Ibuprofen)   4. Osteoporosis, unspecified osteoporosis type, unspecified pathological fracture presence Continues on prolia with cal and vit d  5. Hypothyroidism due to acquired atrophy of thyroid TSH at goal in July on synthroid 88 mcg.   6. Chronic congestive heart failure, unspecified heart failure type (HCC) Stable, without increase in edema, shortness of breath on exertion but otherwise breathing stable. Continues on lasix. Overdue for cardiology followup, encouraged to call and schedule routine appt.   7. Primary hypertension Well controlled on amlodipine   8. DOE (dyspnea on exertion) Ongoing, suspect due to inactivity and debility, O2 maintained over 92% when walking in the hallways. Encouraged to slowly increase physical activity as tolerates  Next appt: 6 months Faatima Tench K. [place], [facility] [phone]

## 2020-07-05 ENCOUNTER — Other Ambulatory Visit: Payer: Self-pay | Admitting: Nurse Practitioner

## 2020-07-05 DIAGNOSIS — I509 Heart failure, unspecified: Secondary | ICD-10-CM

## 2020-07-14 LAB — HM DIABETES EYE EXAM

## 2020-07-17 ENCOUNTER — Encounter: Payer: Self-pay | Admitting: Nurse Practitioner

## 2020-07-19 ENCOUNTER — Other Ambulatory Visit: Payer: Self-pay | Admitting: Nurse Practitioner

## 2020-08-28 ENCOUNTER — Other Ambulatory Visit: Payer: Self-pay | Admitting: Nurse Practitioner

## 2020-08-28 DIAGNOSIS — R413 Other amnesia: Secondary | ICD-10-CM

## 2020-09-07 ENCOUNTER — Other Ambulatory Visit: Payer: Self-pay

## 2020-09-07 ENCOUNTER — Ambulatory Visit (INDEPENDENT_AMBULATORY_CARE_PROVIDER_SITE_OTHER): Payer: Medicare PPO

## 2020-09-07 DIAGNOSIS — M810 Age-related osteoporosis without current pathological fracture: Secondary | ICD-10-CM

## 2020-09-07 MED ORDER — DENOSUMAB 60 MG/ML ~~LOC~~ SOSY: 60.0000 mg | PREFILLED_SYRINGE | Freq: Once | SUBCUTANEOUS | 0 refills | Status: DC

## 2020-09-07 MED ORDER — DENOSUMAB 60 MG/ML ~~LOC~~ SOSY
60.0000 mg | PREFILLED_SYRINGE | Freq: Once | SUBCUTANEOUS | Status: AC
  Administered 2020-09-07: 60 mg via SUBCUTANEOUS

## 2020-09-07 NOTE — Addendum Note (Signed)
Addended by: Tanna Savoy on: 09/07/2020 09:46 AM   Modules accepted: Orders

## 2020-09-14 ENCOUNTER — Other Ambulatory Visit: Payer: Self-pay | Admitting: Nurse Practitioner

## 2020-09-14 DIAGNOSIS — E034 Atrophy of thyroid (acquired): Secondary | ICD-10-CM

## 2020-09-15 ENCOUNTER — Encounter (HOSPITAL_COMMUNITY): Payer: Self-pay | Admitting: Cardiology

## 2020-09-15 ENCOUNTER — Ambulatory Visit (HOSPITAL_COMMUNITY)
Admission: RE | Admit: 2020-09-15 | Discharge: 2020-09-15 | Disposition: A | Discharge status: Home / Self Care | Payer: Medicare PPO | Source: Ambulatory Visit | Attending: Cardiology | Admitting: Cardiology

## 2020-09-15 ENCOUNTER — Other Ambulatory Visit: Payer: Self-pay

## 2020-09-15 VITALS — BP 140/70 | HR 94 | Wt 213.8 lb

## 2020-09-15 DIAGNOSIS — I5032 Chronic diastolic (congestive) heart failure: Secondary | ICD-10-CM | POA: Diagnosis not present

## 2020-09-15 DIAGNOSIS — R0602 Shortness of breath: Secondary | ICD-10-CM | POA: Diagnosis not present

## 2020-09-15 DIAGNOSIS — I50810 Right heart failure, unspecified: Secondary | ICD-10-CM | POA: Diagnosis not present

## 2020-09-15 DIAGNOSIS — E785 Hyperlipidemia, unspecified: Secondary | ICD-10-CM | POA: Insufficient documentation

## 2020-09-15 DIAGNOSIS — Z794 Long term (current) use of insulin: Secondary | ICD-10-CM | POA: Diagnosis not present

## 2020-09-15 DIAGNOSIS — I2729 Other secondary pulmonary hypertension: Secondary | ICD-10-CM | POA: Diagnosis not present

## 2020-09-15 DIAGNOSIS — N183 Chronic kidney disease, stage 3 unspecified: Secondary | ICD-10-CM | POA: Insufficient documentation

## 2020-09-15 DIAGNOSIS — I130 Hypertensive heart and chronic kidney disease with heart failure and stage 1 through stage 4 chronic kidney disease, or unspecified chronic kidney disease: Secondary | ICD-10-CM | POA: Diagnosis present

## 2020-09-15 DIAGNOSIS — R0600 Dyspnea, unspecified: Secondary | ICD-10-CM | POA: Diagnosis not present

## 2020-09-15 DIAGNOSIS — Z79899 Other long term (current) drug therapy: Secondary | ICD-10-CM | POA: Insufficient documentation

## 2020-09-15 DIAGNOSIS — M1990 Unspecified osteoarthritis, unspecified site: Secondary | ICD-10-CM | POA: Insufficient documentation

## 2020-09-15 DIAGNOSIS — Z7901 Long term (current) use of anticoagulants: Secondary | ICD-10-CM | POA: Insufficient documentation

## 2020-09-15 LAB — BASIC METABOLIC PANEL
Anion gap: 11 (ref 5–15)
BUN: 26 mg/dL — ABNORMAL HIGH (ref 8–23)
CO2: 27 mmol/L (ref 22–32)
Calcium: 10.8 mg/dL — ABNORMAL HIGH (ref 8.9–10.3)
Chloride: 103 mmol/L (ref 98–111)
Creatinine, Ser: 1.51 mg/dL — ABNORMAL HIGH (ref 0.44–1.00)
GFR, Estimated: 33 mL/min — ABNORMAL LOW (ref >60)
Glucose, Bld: 112 mg/dL — ABNORMAL HIGH (ref 70–99)
Potassium: 4.4 mmol/L (ref 3.5–5.1)
Sodium: 141 mmol/L (ref 135–145)

## 2020-09-15 LAB — CBC
HCT: 44 % (ref 36.0–46.0)
Hemoglobin: 14.3 g/dL (ref 12.0–15.0)
MCH: 29.5 pg (ref 26.0–34.0)
MCHC: 32.5 g/dL (ref 30.0–36.0)
MCV: 90.7 fL (ref 80.0–100.0)
Platelets: 195 10*3/uL (ref 150–400)
RBC: 4.85 MIL/uL (ref 3.87–5.11)
RDW: 14.6 % (ref 11.5–15.5)
WBC: 7.5 10*3/uL (ref 4.0–10.5)
nRBC: 0 % (ref 0.0–0.2)

## 2020-09-15 LAB — BRAIN NATRIURETIC PEPTIDE: B Natriuretic Peptide: 27 pg/mL (ref 0.0–100.0)

## 2020-09-15 LAB — TSH: TSH: 2.076 u[IU]/mL (ref 0.350–4.500)

## 2020-09-15 NOTE — Patient Instructions (Signed)
Labs done today. We will contact you only if your labs are abnormal.  No medication changes were made. Please continue all current medications as prescribed.  Your physician recommends that you schedule a follow-up appointment in: soon for an echo and in 6 months with Dr. Aundra Dubin. Please contact our office in July to schedule a August appointment.   If you have any questions or concerns before your next appointment please send [place] a message through [place] or call our office at [phone].    TO LEAVE A MESSAGE FOR THE NURSE SELECT OPTION 2, PLEASE LEAVE A MESSAGE INCLUDING: . YOUR NAME . DATE OF BIRTH . CALL BACK NUMBER . REASON FOR CALL**this is important as we prioritize the call backs  YOU WILL RECEIVE A CALL BACK THE SAME DAY AS LONG AS YOU CALL BEFORE 4:00 PM   Do the following things EVERYDAY: 1) Weigh yourself in the morning before breakfast. Write it down and keep it in a log. 2) Take your medicines as prescribed 3) Eat low salt foods--Limit salt (sodium) to 2000 mg per day.  4) Stay as active as you can everyday 5) Limit all fluids for the day to less than 2 liters   At the [facility], you and your health needs are our priority. As part of our continuing mission to provide you with exceptional heart care, we have created designated Provider Care Teams. These Care Teams include your primary Cardiologist (physician) and Advanced Practice Providers (APPs- Physician Assistants and Nurse Practitioners) who all work together to provide you with the care you need, when you need it.   You may see any of the following providers on your designated Care Team at your next follow up: Marland Kitchen Dr Glori Bickers . Dr Loralie Champagne . Darrick Grinder, NP . Lyda Jester, PA . Audry Riles, PharmD   Please be sure to bring in all your medications bottles to every appointment.

## 2020-09-15 NOTE — Progress Notes (Signed)
ReDS Vest / Clip - 09/15/20 1400      ReDS Vest / Clip   Station Marker C    Ruler Value 27    ReDS Value Range Low volume    ReDS Actual Value 30    Anatomical Comments sitting

## 2020-09-16 NOTE — Progress Notes (Signed)
Patient ID: Melissa Shannon, female   DOB: 03/01/1928, 87 y.o.   MRN: [id number] PCP: Dr. Bubba Camp Cardiology: Dr. Aundra Dubin  87 y.o. with history of HTN, DM, and immobility due to osteoarthritis presents for followup of right heart failure and PE.  At baseline, patient is not very active.  She has knee arthritis that is very limiting.  She does not do much walking and uses a walker to get around.  She has been very limited for a few years now.  She was admitted in 10/15 with dyspnea.  The dyspnea began about 2-3 days prior to admission.  She would get short of breath after walking about 20 feet with her walker.  She was noted to be in CHF at admission with troponin elevated to 0.6.  Echo showed normal LV size and systolic function but severely dilated/dysfunctional RV with pulmonary hypertension.  V/Q scan was done and showed intermediate probability for PE.  She was seen by pulmonary and thought to have a PE.  Apixaban was started.  She was diuresed with IV Lasix.  Repeat echo was done 3/16.  This showed EF 55-60%, normal RV size and systolic function, and PA systolic pressure 28 mmHg.  V/Q scan in 5/16 was normal.   She was admitted in 4/16 with altered mental status, likely due to E coli urosepsis.   She was admitted in 1/20 after getting tracheal edema likely as an ACEI reaction.  Echo in 1/20 showed EF 55-60% with mild AS and normal RV size and systolic function.  Echo in 1/21 showed EF 55-60%, mild-moderate AS with mean gradient 13 and AVA 1.3 cm^2, mild MR.   She reports increased exertional dyspnea recently.  She is short of breath walking across the house.  She uses her walker due to poor balance.  She is not very active. Weight is down 6 lbs.  No chest pain.  No lightheadedness.  No orthopnea/PND.    REDS clip 30%.   ECG (personally reviewed): NSR, anteroseptal Qs  Labs (06/18/14): K 4.4, creatinine 1.44, HCT 35.9, BNP 10167 => 1415 Labs (09/01/2014): K 4.2 Creatinine 1.23  Labs (2/16): K  4.3, creatinine 1.62, HCT 40.7 Labs (7/16): K 5.2, creatinine 1.66 Labs (6/17): creatinine 1.25, LDL 64 Labs (7/17): HCT 43.5 Labs (10/17): K 4.9, creatinine 1.44, BNP 25 Labs (2/18): K 4.6, creatinine 1.27 Labs (7/19): LDL 77, pro-BNP 34, hgb 14.2  Labs (11/20): LDL 76, K 3.8, creatinine 1.18 Labs (11/21): LDL 55, HDL 65, K 4.1, creatinine 1.49  PMH: 1. HTN - tracheal edema with ACEI.  2. Hyperlipidemia 3. Osteoarthritis: Involving knees, uses walker.  4. Type II diabetes 5. H/o CCY 6. CKD stage III 7. Hypothyroidism 8. PE: Admitted 10/15 with dyspnea/CHF.  Echo (10/15) with EF 60-65%, D-shaped interventricular septum, severely dilated RV with moderately decreased RV systolic function, PA systolic pressure 47 mmHg.  V/Q scan (10/15) was intermediate probability for PE. Patient was started on apixaban.  Echo (3/16) with EF 55-60%, basal inferior hypokinesis, normal RV size and systolic function, PA systolic pressure 28 mmHg. V/Q scan 5/16 was normal.  - Echo (1/20): EF 55-60%, mild AS, normal RV size and systolic function.  - Echo (1/21): EF 55-60%, mild-moderate AS with mean gradient 13 and AVA 1.3 cm^2, mild MR. 9. Nodules on CT chest 10/15: Per Dr Melvyn Novas, likely benign findings.  10. GERD 11. Aortic stenosis: Mild on 1/20 echo (mean gradient 13).  Mild-moderate on 1/21 echo.   FH: No h/o venous thromboembolism,  no heart problems that she knows of.   SH: Widow, lives alone, nonsmoker, 3 kids.   ROS: All systems reviewed and negative except as per HPI.   Current Outpatient Medications  Medication Sig Dispense Refill  . amLODipine (NORVASC) 5 MG tablet TAKE 1 TABLET BY MOUTH EVERY DAY 90 tablet 1  . BD PEN NEEDLE NANO 2ND GEN 32G X 4 MM MISC USE AS DIRECTED ONCE DAILY 100 each 11  . Cholecalciferol (VITAMIN D3) 5000 UNITS CHEW Chew 5,000 Units by mouth daily.     Marland Kitchen donepezil (ARICEPT ODT) 10 MG disintegrating tablet Take 10 mg by mouth 2 (two) times daily.    Marland Kitchen ELIQUIS 5 MG TABS  tablet TAKE 1 TABLET BY MOUTH TWICE A DAY 60 tablet 5  . furosemide (LASIX) 20 MG tablet TAKE 1 TABLET EVERY DAY 30 tablet 5  . LEVEMIR FLEXTOUCH 100 UNIT/ML FlexPen INJECT 24 UNITS INTO THE SKIN DAILY 15 mL 5  . levothyroxine (SYNTHROID) 88 MCG tablet TAKE 1 TABLET BY MOUTH EVERY DAY BEFORE BREKAFAST 90 tablet 0  . Multiple Vitamin (MULTIVITAMIN WITH MINERALS) TABS tablet Take 1 tablet by mouth daily. 30 tablet 0  . Nutritional Supplements (GLUCERNA SHAKE PO) Take by mouth.    Glory Rosebush DELICA LANCETS 81X MISC USE EVERY OTHER DAY TO TEST BLOOD SUGAR 100 each 3  . ONETOUCH ULTRA test strip USE DAILY TO TEST BLOOD SUGAR. DX: E11.22 100 strip 11  . rosuvastatin (CRESTOR) 40 MG tablet TAKE 1 TABLET BY MOUTH EVERY DAY FOR CHOLESTEROL 90 tablet 1  . TRADJENTA 5 MG TABS tablet TAKE 1 TABLET BY MOUTH EVERY DAY 90 tablet 1   No current facility-administered medications for this encounter.   BP 140/70   Pulse 94   Wt 97 kg (213 lb 12.8 oz)   SpO2 95%   BMI 32.51 kg/m   General: NAD Neck: No JVD, no thyromegaly or thyroid nodule.  Lungs: Clear to auscultation bilaterally with normal respiratory effort. CV: Nondisplaced PMI.  Heart regular S1/S2, no S3/S4, 2/6 early SEM RUSB.  Non-pitting lymphedema lower legs.  No carotid bruit.  Normal pedal pulses.  Abdomen: Soft, nontender, no hepatosplenomegaly, no distention.  Skin: Intact without lesions or rashes.  Neurologic: Alert and oriented x 3.  Psych: Normal affect. Extremities: No clubbing or cyanosis.  HEENT: Normal.   Assessment/Plan 1. Right heart failure: Echo on 10/15 showed dilated RV with decreased systolic function.  V/Q scan with suspected PE. 1/20 echo showed normal RV size and systolic function with normal estimate PA systolic pressure.  5/16 V/Q scan showed no PE.  Most recent echo in 1/21 showed normal RV function by report.  She is not volume overloaded by exam or REDS clip though she has NYHA class III symptoms.  I suspect  obesity/deconditioning are a major cause of her exercise intolerance. - Check BNP.  - Continue current Lasix regimen, she is taking 20 mg daily.  BMET today.  - Repeat echo to follow AS.  2. PE: V/Q scan intermediate probability for PE in 10/15.  Pulmonary saw and suspect that she did indeed have PE.  The PE was spontaneous.  She is at risk given general immobility. No bleeding problems.  She did not have evidence for chronic PE on 5/16 V/Q scan.   - I would recommend that she continue apixaban long-term to decrease risk as she will likely remain fairly immobile long-term due to her arthritis.   3. HTN: BP reasonably controlled.  4. Hyperlipidemia: Continue Crestor, good LDL in 11/21.    Followup in 6 months if echo is unremarkable.   Loralie Champagne 09/16/2020

## 2020-09-23 ENCOUNTER — Ambulatory Visit (HOSPITAL_COMMUNITY)
Admission: RE | Admit: 2020-09-23 | Discharge: 2020-09-23 | Disposition: A | Discharge status: Home / Self Care | Payer: Medicare PPO | Source: Ambulatory Visit | Attending: Nurse Practitioner | Admitting: Nurse Practitioner

## 2020-09-23 ENCOUNTER — Other Ambulatory Visit: Payer: Self-pay

## 2020-09-23 DIAGNOSIS — I700 Atherosclerosis of aorta: Secondary | ICD-10-CM | POA: Diagnosis not present

## 2020-09-23 DIAGNOSIS — I5032 Chronic diastolic (congestive) heart failure: Secondary | ICD-10-CM | POA: Diagnosis not present

## 2020-09-23 DIAGNOSIS — E785 Hyperlipidemia, unspecified: Secondary | ICD-10-CM | POA: Diagnosis not present

## 2020-09-23 DIAGNOSIS — Z86711 Personal history of pulmonary embolism: Secondary | ICD-10-CM | POA: Diagnosis not present

## 2020-09-23 DIAGNOSIS — E1122 Type 2 diabetes mellitus with diabetic chronic kidney disease: Secondary | ICD-10-CM | POA: Diagnosis not present

## 2020-09-23 DIAGNOSIS — I130 Hypertensive heart and chronic kidney disease with heart failure and stage 1 through stage 4 chronic kidney disease, or unspecified chronic kidney disease: Secondary | ICD-10-CM | POA: Diagnosis not present

## 2020-09-23 DIAGNOSIS — N189 Chronic kidney disease, unspecified: Secondary | ICD-10-CM | POA: Insufficient documentation

## 2020-09-23 DIAGNOSIS — E039 Hypothyroidism, unspecified: Secondary | ICD-10-CM | POA: Insufficient documentation

## 2020-09-23 DIAGNOSIS — I083 Combined rheumatic disorders of mitral, aortic and tricuspid valves: Secondary | ICD-10-CM | POA: Insufficient documentation

## 2020-09-23 LAB — ECHOCARDIOGRAM COMPLETE
AR max vel: 1.63 cm2
AV Area VTI: 1.68 cm2
AV Area mean vel: 1.65 cm2
AV Mean grad: 9.3 mmHg
AV Peak grad: 15.2 mmHg
Ao pk vel: 1.95 m/s
Area-P 1/2: 2.26 cm2
S' Lateral: 2.3 cm

## 2020-09-23 NOTE — Progress Notes (Signed)
  Echocardiogram 2D Echocardiogram has been performed.  [facility] G Melissa Shannon 09/23/2020, 3:05 PM

## 2020-10-04 ENCOUNTER — Other Ambulatory Visit: Payer: Self-pay | Admitting: Nurse Practitioner

## 2020-10-04 DIAGNOSIS — E785 Hyperlipidemia, unspecified: Secondary | ICD-10-CM

## 2020-10-04 DIAGNOSIS — E1169 Type 2 diabetes mellitus with other specified complication: Secondary | ICD-10-CM

## 2020-10-30 ENCOUNTER — Encounter: Payer: Self-pay | Admitting: Nurse Practitioner

## 2020-10-30 ENCOUNTER — Ambulatory Visit: Payer: Medicare PPO | Admitting: Nurse Practitioner

## 2020-10-30 ENCOUNTER — Other Ambulatory Visit: Payer: Self-pay

## 2020-10-30 VITALS — BP 118/70 | HR 79 | Temp 97.6°F | Ht 68.0 in | Wt 212.2 lb

## 2020-10-30 DIAGNOSIS — I10 Essential (primary) hypertension: Secondary | ICD-10-CM | POA: Diagnosis not present

## 2020-10-30 DIAGNOSIS — E034 Atrophy of thyroid (acquired): Secondary | ICD-10-CM

## 2020-10-30 DIAGNOSIS — E440 Moderate protein-calorie malnutrition: Secondary | ICD-10-CM

## 2020-10-30 DIAGNOSIS — I5032 Chronic diastolic (congestive) heart failure: Secondary | ICD-10-CM

## 2020-10-30 DIAGNOSIS — E1129 Type 2 diabetes mellitus with other diabetic kidney complication: Secondary | ICD-10-CM

## 2020-10-30 DIAGNOSIS — E1165 Type 2 diabetes mellitus with hyperglycemia: Secondary | ICD-10-CM

## 2020-10-30 DIAGNOSIS — M810 Age-related osteoporosis without current pathological fracture: Secondary | ICD-10-CM

## 2020-10-30 DIAGNOSIS — E1169 Type 2 diabetes mellitus with other specified complication: Secondary | ICD-10-CM | POA: Diagnosis not present

## 2020-10-30 DIAGNOSIS — E785 Hyperlipidemia, unspecified: Secondary | ICD-10-CM

## 2020-10-30 DIAGNOSIS — IMO0002 Reserved for concepts with insufficient information to code with codable children: Secondary | ICD-10-CM

## 2020-10-30 DIAGNOSIS — Z86711 Personal history of pulmonary embolism: Secondary | ICD-10-CM

## 2020-10-30 DIAGNOSIS — N184 Chronic kidney disease, stage 4 (severe): Secondary | ICD-10-CM

## 2020-10-30 DIAGNOSIS — N3941 Urge incontinence: Secondary | ICD-10-CM

## 2020-10-30 NOTE — Progress Notes (Signed)
Careteam: Patient Care Team: Lauree Chandler, NP as PCP - General (Nurse Practitioner) Larey Dresser, MD as PCP - Advanced Heart Failure (Cardiology) Tanda Rockers, MD as Consulting Physician (Pulmonary Disease) Larey Dresser, MD as Consulting Physician (Cardiology)  PLACE OF SERVICE:  [place] Directive information Does Patient Have a Medical Advance Directive?: Yes, Type of Advance Directive: Out of facility DNR (pink MOST or yellow form), Pre-existing out of facility DNR order (yellow form or pink MOST form): Yellow form placed in chart (order not valid for inpatient use), Does patient want to make changes to medical advance directive?: No - Patient declined  Allergies  Allergen Reactions  . Lisinopril Shortness Of Breath    Tracheal Edema  . Penicillins Other (See Comments)    DID THE REACTION INVOLVE: Swelling of the face/tongue/throat, SOB, or low BP? Yes Sudden or severe rash/hives, skin peeling, or the inside of the mouth or nose? No Did it require medical treatment? No When did it last happen?more than 10 years  If all above answers are "NO", may proceed with cephalosporin use.   Throat felt tight    Chief Complaint  Patient presents with  . Medical Management of Chronic Issues    4 month follow up.     HPI: Patient is a 87 y.o. female for routine follow up  DM- A1c 6.8 4 months ago, continues on tradjenta and levemir 24 units. No hypoglycemia.   htn-controlled on norvasc 5 mg daily   Hx of PE - continues on eliquis for anticoagulation, no signs of recurrence. No increase in bleeding or bruising.   Hyperlipidemia- continues on crestor 40 mg daily, LDL at goal on last labs.   CHF- stable, continues on lasix, echo done last month, EF 55-60%  Osteoporosis- on prolia with vit d  Memory loss- taking aricept but once daily before bed.   Review of Systems:  Review of Systems  Constitutional: Negative for chills, fever and weight  loss.  HENT: Negative for tinnitus.   Respiratory: Negative for cough, sputum production and shortness of breath.   Cardiovascular: Negative for chest pain, palpitations and leg swelling.  Gastrointestinal: Negative for abdominal pain, constipation, diarrhea and heartburn.  Genitourinary: Negative for dysuria, frequency and urgency.  Musculoskeletal: Negative for back pain, falls, joint pain and myalgias.  Skin: Negative.   Neurological: Negative for dizziness and headaches.  Psychiatric/Behavioral: Positive for memory loss (mild). Negative for depression. The patient does not have insomnia.     Past Medical History:  Diagnosis Date  . Arthritis    bilateral knees  . Chronic diastolic CHF (congestive heart failure) ([place])   . CKD (chronic kidney disease), stage III ([place])   . Diabetes mellitus without complication ([place])   . Does use hearing aid   . Edema   . Hyperlipidemia   . Hypertension   . Hypothyroidism   . Pulmonary embolism ([place])   . Urinary incontinence    Past Surgical History:  Procedure Laterality Date  . BLADDER SURGERY  2000   Dr.Tannerbaum (Bladder Tact)  . CATARACT EXTRACTION  03/2013   Dr.Shapiro  . CHOLECYSTECTOMY    . EYE SURGERY     bilateral cataracts  . [facility]   . right elbow  1999   x 2, still has one piece of metal in it  . TONSILLECTOMY AND ADENOIDECTOMY  1937   Dr.Brewer   Social History:   reports that  she has never smoked. She has never used smokeless tobacco. She reports that she does not drink alcohol and does not use drugs.  Family History  Problem Relation Age of Onset  . Suicidality Father   . Cancer Brother   . Cancer Maternal Grandfather   . Diabetes Maternal Grandmother   . Melanoma Brother     Medications: Patient's Medications  New Prescriptions   No medications on file  Previous Medications   AMLODIPINE (NORVASC) 5 MG TABLET    TAKE 1 TABLET BY MOUTH EVERY DAY   BD PEN NEEDLE NANO  2ND GEN 32G X 4 MM MISC    USE AS DIRECTED ONCE DAILY   CHOLECALCIFEROL (VITAMIN D3) 5000 UNITS CHEW    Chew 5,000 Units by mouth daily.    ELIQUIS 5 MG TABS TABLET    TAKE 1 TABLET BY MOUTH TWICE A DAY   FUROSEMIDE (LASIX) 20 MG TABLET    TAKE 1 TABLET EVERY DAY   LEVEMIR FLEXTOUCH 100 UNIT/ML FLEXPEN    INJECT 24 UNITS INTO THE SKIN DAILY   LEVOTHYROXINE (SYNTHROID) 88 MCG TABLET    TAKE 1 TABLET BY MOUTH EVERY DAY BEFORE BREKAFAST   MULTIPLE VITAMIN (MULTIVITAMIN WITH MINERALS) TABS TABLET    Take 1 tablet by mouth daily.   NUTRITIONAL SUPPLEMENTS (GLUCERNA SHAKE PO)    Take by mouth.   ONETOUCH DELICA LANCETS 81X MISC    USE EVERY OTHER DAY TO TEST BLOOD SUGAR   ONETOUCH ULTRA TEST STRIP    USE DAILY TO TEST BLOOD SUGAR. DX: E11.22   ROSUVASTATIN (CRESTOR) 40 MG TABLET    TAKE 1 TABLET BY MOUTH EVERY DAY FOR CHOLESTEROL   TRADJENTA 5 MG TABS TABLET    TAKE 1 TABLET BY MOUTH EVERY DAY  Modified Medications   No medications on file  Discontinued Medications   DONEPEZIL (ARICEPT ODT) 10 MG DISINTEGRATING TABLET    Take 10 mg by mouth 2 (two) times daily.    Physical Exam:  Vitals:   10/30/20 0910  BP: 118/70  Pulse: 79  Temp: 97.6 F (36.4 C)  TempSrc: Temporal  SpO2: 96%  Weight: 212 lb 3.2 oz (96.3 kg)  Height: 5\' 8"  (1.727 m)   Body mass index is 32.26 kg/m. Wt Readings from Last 3 Encounters:  10/30/20 212 lb 3.2 oz (96.3 kg)  09/15/20 213 lb 12.8 oz (97 kg)  07/03/20 217 lb 9.6 oz (98.7 kg)    Physical Exam Constitutional:      General: She is not in acute distress.    Appearance: She is well-developed. She is not diaphoretic.  HENT:     Head: Normocephalic and atraumatic.     Mouth/Throat:     Pharynx: No oropharyngeal exudate.  Eyes:     Conjunctiva/sclera: Conjunctivae normal.     Pupils: Pupils are equal, round, and reactive to light.  Cardiovascular:     Rate and Rhythm: Normal rate and regular rhythm.     Heart sounds: Normal heart sounds.  Pulmonary:      Effort: Pulmonary effort is normal.     Breath sounds: Normal breath sounds.  Abdominal:     General: Bowel sounds are normal.     Palpations: Abdomen is soft.  Musculoskeletal:        General: No tenderness.     Cervical back: Normal range of motion and neck supple.  Skin:    General: Skin is warm and dry.  Neurological:     Mental Status:  She is alert and oriented to person, place, and time.     Labs reviewed: Basic Metabolic Panel: Recent Labs    12/30/19 1007 03/02/20 0946 06/29/20 0000 09/15/20 1429  NA 143  --  144 141  K 4.1  --  4.1 4.4  CL 105  --  105 103  CO2 31  --  29 27  GLUCOSE 143*  --  99 112*  BUN 29*  --  39* 26*  CREATININE 1.36*  --  1.49* 1.51*  CALCIUM 10.2  --  10.3 10.8*  TSH 0.33* 1.11  --  2.076   Liver Function Tests: Recent Labs    12/30/19 1007 06/29/20 0000  AST 28 21  ALT 36* 22  BILITOT 0.7 0.4  PROT 6.0* 6.4   No results for input(s): LIPASE, AMYLASE in the last 8760 hours. No results for input(s): AMMONIA in the last 8760 hours. CBC: Recent Labs    12/30/19 1007 06/29/20 0000 09/15/20 1429  WBC 7.4 8.6 7.5  NEUTROABS 4,514 5,513  --   HGB 14.4 13.8 14.3  HCT 45.4* 43.0 44.0  MCV 90.1 91.5 90.7  PLT 182 223 195   Lipid Panel: Recent Labs    06/29/20 0000  CHOL 144  HDL 65  LDLCALC 55  TRIG 164*  CHOLHDL 2.2   TSH: Recent Labs    12/30/19 1007 03/02/20 0946 09/15/20 1429  TSH 0.33* 1.11 2.076   A1C: Lab Results  Component Value Date   HGBA1C 6.8 (H) 06/29/2020     Assessment/Plan 1. Malnutrition of moderate degree (HCC) Protein and albumin has improved on labs in November, continue well balanced diet.   2. Hyperlipidemia associated with type 2 diabetes mellitus ([place]) LDL at goal in November, continues on statin.   3. CKD (chronic kidney disease) stage 4, GFR 15-29 ml/min (HCC) Encourage proper hydration and to avoid NSAIDS (Aleve, Advil, Motrin, Ibuprofen)   4. Primary hypertension Well  controlled on norvasc 5 mg daily.  5. Chronic diastolic CHF (congestive heart failure) (HCC) Stable, euvolemic at this time, continues on lasix, echo done last month, EF 55-60%. No worsening shortness of breath or chest pains.  6. Diabetes mellitus with renal manifestations, uncontrolled ([place]) Continue on current regimen, will update lab today. Encouraged dietary compliance, routine foot care/monitoring and to keep up with diabetic eye exams through ophthalmology  - Hemoglobin A1c  7. Hypothyroidism due to acquired atrophy of thyroid TSH at goal last month, continues on synthroid   8. Osteoporosis, unspecified osteoporosis type, unspecified pathological fracture presence Continues with prolia and vit d  9. History of pulmonary embolus (PE) -on eliquis, no signs of recurrence. No abnormal bruising or bleeding, hgb stable last month.   10. Urge incontinence of urine Ongoing, using pads  Next appt: 4 month Micharl Helmes K. [place], [facility] [phone]

## 2020-10-31 LAB — HEMOGLOBIN A1C
Hgb A1c MFr Bld: 6.7 % of total Hgb — ABNORMAL HIGH (ref <5.7)
Mean Plasma Glucose: 146 mg/dL
eAG (mmol/L): 8.1 mmol/L

## 2020-11-16 ENCOUNTER — Other Ambulatory Visit: Payer: Self-pay | Admitting: Nurse Practitioner

## 2020-11-29 ENCOUNTER — Other Ambulatory Visit: Payer: Self-pay | Admitting: Nurse Practitioner

## 2020-11-29 DIAGNOSIS — I509 Heart failure, unspecified: Secondary | ICD-10-CM

## 2020-12-14 ENCOUNTER — Other Ambulatory Visit: Payer: Self-pay | Admitting: Nurse Practitioner

## 2020-12-27 ENCOUNTER — Other Ambulatory Visit: Payer: Self-pay | Admitting: Nurse Practitioner

## 2020-12-27 DIAGNOSIS — E034 Atrophy of thyroid (acquired): Secondary | ICD-10-CM

## 2021-01-09 ENCOUNTER — Other Ambulatory Visit: Payer: Self-pay | Admitting: Nurse Practitioner

## 2021-01-31 ENCOUNTER — Other Ambulatory Visit: Payer: Self-pay | Admitting: Nurse Practitioner

## 2021-01-31 DIAGNOSIS — E034 Atrophy of thyroid (acquired): Secondary | ICD-10-CM

## 2021-02-26 ENCOUNTER — Ambulatory Visit: Payer: Medicare PPO | Admitting: Nurse Practitioner

## 2021-02-26 ENCOUNTER — Other Ambulatory Visit: Payer: Self-pay

## 2021-02-26 ENCOUNTER — Encounter: Payer: Self-pay | Admitting: Nurse Practitioner

## 2021-02-26 VITALS — BP 120/70 | HR 80 | Temp 96.9°F | Ht 68.0 in | Wt 207.4 lb

## 2021-02-26 DIAGNOSIS — E785 Hyperlipidemia, unspecified: Secondary | ICD-10-CM

## 2021-02-26 DIAGNOSIS — N184 Chronic kidney disease, stage 4 (severe): Secondary | ICD-10-CM

## 2021-02-26 DIAGNOSIS — E1169 Type 2 diabetes mellitus with other specified complication: Secondary | ICD-10-CM | POA: Diagnosis not present

## 2021-02-26 DIAGNOSIS — I10 Essential (primary) hypertension: Secondary | ICD-10-CM

## 2021-02-26 DIAGNOSIS — E1129 Type 2 diabetes mellitus with other diabetic kidney complication: Secondary | ICD-10-CM

## 2021-02-26 DIAGNOSIS — E1165 Type 2 diabetes mellitus with hyperglycemia: Secondary | ICD-10-CM

## 2021-02-26 DIAGNOSIS — IMO0002 Reserved for concepts with insufficient information to code with codable children: Secondary | ICD-10-CM

## 2021-02-26 DIAGNOSIS — Z86711 Personal history of pulmonary embolism: Secondary | ICD-10-CM

## 2021-02-26 DIAGNOSIS — I5032 Chronic diastolic (congestive) heart failure: Secondary | ICD-10-CM

## 2021-02-26 DIAGNOSIS — Z6831 Body mass index (BMI) 31.0-31.9, adult: Secondary | ICD-10-CM

## 2021-02-26 DIAGNOSIS — M810 Age-related osteoporosis without current pathological fracture: Secondary | ICD-10-CM

## 2021-02-26 DIAGNOSIS — E6609 Other obesity due to excess calories: Secondary | ICD-10-CM

## 2021-02-26 NOTE — Progress Notes (Signed)
Careteam: Patient Care Team: Lauree Chandler, NP as PCP - General (Nurse Practitioner) Larey Dresser, MD as PCP - Advanced Heart Failure (Cardiology) Tanda Rockers, MD as Consulting Physician (Pulmonary Disease) Larey Dresser, MD as Consulting Physician (Cardiology)  PLACE OF SERVICE:  [place] Directive information Does Patient Have a Medical Advance Directive?: Yes, Type of Advance Directive: Out of facility DNR (pink MOST or yellow form), Pre-existing out of facility DNR order (yellow form or pink MOST form): Yellow form placed in chart (order not valid for inpatient use), Does patient want to make changes to medical advance directive?: No - Patient declined  Allergies  Allergen Reactions   Lisinopril Shortness Of Breath    Tracheal Edema   Penicillins Other (See Comments)    DID THE REACTION INVOLVE: Swelling of the face/tongue/throat, SOB, or low BP? Yes Sudden or severe rash/hives, skin peeling, or the inside of the mouth or nose? No Did it require medical treatment? No When did it last happen?      more than 10 years  If all above answers are "NO", may proceed with cephalosporin use.   Throat felt tight    Chief Complaint  Patient presents with   Medical Management of Chronic Issues    4 month follow up   Health Maintenance    Zoster vaccine, Pneumonia, Tetanus/Tdap, 2nd COVID booster, foot exam     HPI: Patient is a 87 y.o. female for routine follow up.   Reports she is doing well, gets short of breath easily with activity.  Went shopping yesterday and did okay with that. Her daughter is here for the month.  She is sedentary and not very active. Uses the walker.  No falls.  No abnormal aches or pains.  No abnormal bruising or bleeding.   DM- taking tradjenta 5 mg and levemir 24 units. No hypoglycemia  Memory loss- continues on aricept 10 mg daily. Can not remember name.   She does not take any vaccines. Did take the original  COVID vaccine.  Review of Systems:  Review of Systems  Constitutional:  Negative for chills, fever and weight loss.  HENT:  Negative for tinnitus.   Respiratory:  Negative for cough, sputum production and shortness of breath.   Cardiovascular:  Negative for chest pain, palpitations and leg swelling.  Gastrointestinal:  Negative for abdominal pain, constipation, diarrhea and heartburn.  Genitourinary:  Negative for dysuria, frequency and urgency.  Musculoskeletal:  Positive for joint pain. Negative for back pain, falls and myalgias.  Skin: Negative.   Neurological:  Negative for dizziness and headaches.  Psychiatric/Behavioral:  Negative for depression and memory loss. The patient does not have insomnia.    Past Medical History:  Diagnosis Date   Arthritis    bilateral knees   Chronic diastolic CHF (congestive heart failure) (HCC)    CKD (chronic kidney disease), stage III (HCC)    Diabetes mellitus without complication ([place])    Does use hearing aid    Edema    Hyperlipidemia    Hypertension    Hypothyroidism    Pulmonary embolism ([place])    Urinary incontinence    Past Surgical History:  Procedure Laterality Date   BLADDER SURGERY  2000   Dr.Tannerbaum (Bladder Tact)   CATARACT EXTRACTION  03/2013   Dr.Shapiro   CHOLECYSTECTOMY     EYE SURGERY     bilateral cataracts   [facility]  right elbow  1999   x 2, still has one piece of metal in it   [place]   Dr.Brewer   Social History:   reports that she has never smoked. She has never used smokeless tobacco. She reports that she does not drink alcohol and does not use drugs.  Family History  Problem Relation Age of Onset   Suicidality Father    Cancer Brother    Cancer Maternal Grandfather    Diabetes Maternal Grandmother    Melanoma Brother     Medications: Patient's Medications  New Prescriptions   No medications on file  Previous Medications    AMLODIPINE (NORVASC) 5 MG TABLET    TAKE 1 TABLET BY MOUTH EVERY DAY   BD PEN NEEDLE NANO 2ND GEN 32G X 4 MM MISC    USE AS DIRECTED ONCE DAILY   CHOLECALCIFEROL (VITAMIN D3) 5000 UNITS CHEW    Chew 5,000 Units by mouth daily.    DONEPEZIL (ARICEPT) 10 MG TABLET    Take 10 mg by mouth at bedtime.   ELIQUIS 5 MG TABS TABLET    TAKE 1 TABLET BY MOUTH TWICE A DAY   FUROSEMIDE (LASIX) 20 MG TABLET    TAKE 1 TABLET BY MOUTH EVERY DAY   LEVEMIR FLEXTOUCH 100 UNIT/ML FLEXPEN    INJECT 24 UNITS INTO THE SKIN DAILY   LEVOTHYROXINE (SYNTHROID) 88 MCG TABLET    TAKE 1 TABLET BY MOUTH DAILY BEFORE BREAKFAST   MULTIPLE VITAMIN (MULTIVITAMIN WITH MINERALS) TABS TABLET    Take 1 tablet by mouth daily.   NUTRITIONAL SUPPLEMENTS (GLUCERNA SHAKE PO)    Take by mouth.   ONETOUCH DELICA LANCETS 63J MISC    USE EVERY OTHER DAY TO TEST BLOOD SUGAR   ONETOUCH ULTRA TEST STRIP    USE DAILY TO TEST BLOOD SUGAR. DX: E11.22   ROSUVASTATIN (CRESTOR) 40 MG TABLET    TAKE 1 TABLET BY MOUTH EVERY DAY FOR CHOLESTEROL   TRADJENTA 5 MG TABS TABLET    TAKE 1 TABLET BY MOUTH EVERY DAY  Modified Medications   No medications on file  Discontinued Medications   No medications on file    Physical Exam:  Vitals:   02/26/21 1040  BP: 120/70  Pulse: 80  Temp: (!) 96.9 F (36.1 C)  TempSrc: Temporal  SpO2: 94%  Weight: 207 lb 6.4 oz (94.1 kg)  Height: 5\' 8"  (1.727 m)   Body mass index is 31.54 kg/m. Wt Readings from Last 3 Encounters:  02/26/21 207 lb 6.4 oz (94.1 kg)  10/30/20 212 lb 3.2 oz (96.3 kg)  09/15/20 213 lb 12.8 oz (97 kg)    Physical Exam Constitutional:      General: She is not in acute distress.    Appearance: She is well-developed. She is not diaphoretic.  HENT:     Head: Normocephalic and atraumatic.     Mouth/Throat:     Pharynx: No oropharyngeal exudate.  Eyes:     Conjunctiva/sclera: Conjunctivae normal.     Pupils: Pupils are equal, round, and reactive to light.  Cardiovascular:     Rate  and Rhythm: Normal rate and regular rhythm.     Heart sounds: Normal heart sounds.  Pulmonary:     Effort: Pulmonary effort is normal.     Breath sounds: Normal breath sounds.  Abdominal:     General: Bowel sounds are normal.     Palpations: Abdomen is soft.  Musculoskeletal:  Cervical back: Normal range of motion and neck supple.     Right lower leg: No edema.     Left lower leg: No edema.  Skin:    General: Skin is warm and dry.  Neurological:     Mental Status: She is alert.  Psychiatric:        Mood and Affect: Mood normal.    Labs reviewed: Basic Metabolic Panel: Recent Labs    03/02/20 0946 06/29/20 0000 09/15/20 1429  NA  --  144 141  K  --  4.1 4.4  CL  --  105 103  CO2  --  29 27  GLUCOSE  --  99 112*  BUN  --  39* 26*  CREATININE  --  1.49* 1.51*  CALCIUM  --  10.3 10.8*  TSH 1.11  --  2.076   Liver Function Tests: Recent Labs    06/29/20 0000  AST 21  ALT 22  BILITOT 0.4  PROT 6.4   No results for input(s): LIPASE, AMYLASE in the last 8760 hours. No results for input(s): AMMONIA in the last 8760 hours. CBC: Recent Labs    06/29/20 0000 09/15/20 1429  WBC 8.6 7.5  NEUTROABS 5,513  --   HGB 13.8 14.3  HCT 43.0 44.0  MCV 91.5 90.7  PLT 223 195   Lipid Panel: Recent Labs    06/29/20 0000  CHOL 144  HDL 65  LDLCALC 55  TRIG 164*  CHOLHDL 2.2   TSH: Recent Labs    03/02/20 0946 09/15/20 1429  TSH 1.11 2.076   A1C: Lab Results  Component Value Date   HGBA1C 6.7 (H) 10/30/2020     Assessment/Plan 1. CKD (chronic kidney disease) stage 4, GFR 15-29 ml/min (HCC) -Encourage proper hydration and to avoid NSAIDS (Aleve, Advil, Motrin, Ibuprofen). Dose adjust medications as needed.   - COMPLETE METABOLIC PANEL WITH GFR  2. Hyperlipidemia associated with type 2 diabetes mellitus ([place]) -continues on crestor 40 mg daily with dietary modifications.  - Lipid panel  3. Diabetes mellitus with renal manifestations, uncontrolled  ([place]) -A1c at goal on last labs. Continue levemir with tradjenta 5 mg daily. Encouraged dietary compliance, routine foot care/monitoring and to keep up with diabetic eye exams through ophthalmology  - COMPLETE METABOLIC PANEL WITH GFR - Hemoglobin A1c  4. Chronic diastolic CHF (congestive heart failure) (HCC) -stable at this time. No signs of fluid overload. Followed with cardiology routinely. Continues on lasix 20 mg daily   5. Primary hypertension -at goal on norvasc 5 mg daily with lasix 20 mg - CBC with Differential/Platelet  6. Body mass index (BMI) of 31.0-31.9 in adult Noted today.  7. Class 1 obesity due to excess calories with serious comorbidity and body mass index (BMI) of 31.0 to 31.9 in adult -education provided on healthy weight loss through increase in physical activity and proper nutrition   8. Osteoporosis, unspecified osteoporosis type, unspecified pathological fracture presence -continues on vit d with prolia injection every 6 months. Scheduled for injection next week.  9. Hx of pulmonary embolus -no signs of recurrence, continues on eliquis for anticoagulation.    Next appt: 6 months.  Carlos American. [place], [facility] [phone]

## 2021-02-27 LAB — COMPLETE METABOLIC PANEL WITH GFR
AG Ratio: 1.9 (calc) (ref 1.0–2.5)
ALT: 41 U/L — ABNORMAL HIGH (ref 6–29)
AST: 38 U/L — ABNORMAL HIGH (ref 10–35)
Albumin: 3.9 g/dL (ref 3.6–5.1)
Alkaline phosphatase (APISO): 47 U/L (ref 37–153)
BUN/Creatinine Ratio: 24 (calc) — ABNORMAL HIGH (ref 6–22)
BUN: 31 mg/dL — ABNORMAL HIGH (ref 7–25)
CO2: 30 mmol/L (ref 20–32)
Calcium: 10.7 mg/dL — ABNORMAL HIGH (ref 8.6–10.4)
Chloride: 106 mmol/L (ref 98–110)
Creat: 1.31 mg/dL — ABNORMAL HIGH (ref 0.60–0.95)
Globulin: 2.1 g/dL (calc) (ref 1.9–3.7)
Glucose, Bld: 77 mg/dL (ref 65–139)
Potassium: 4.2 mmol/L (ref 3.5–5.3)
Sodium: 143 mmol/L (ref 135–146)
Total Bilirubin: 0.6 mg/dL (ref 0.2–1.2)
Total Protein: 6 g/dL — ABNORMAL LOW (ref 6.1–8.1)
eGFR: 38 mL/min/{1.73_m2} — ABNORMAL LOW (ref >=60)

## 2021-02-27 LAB — CBC WITH DIFFERENTIAL/PLATELET
Absolute Monocytes: 622 cells/uL (ref 200–950)
Basophils Absolute: 37 cells/uL (ref 0–200)
Basophils Relative: 0.5 %
Eosinophils Absolute: 133 cells/uL (ref 15–500)
Eosinophils Relative: 1.8 %
HCT: 43.6 % (ref 35.0–45.0)
Hemoglobin: 14 g/dL (ref 11.7–15.5)
Lymphs Abs: 1931 cells/uL (ref 850–3900)
MCH: 29 pg (ref 27.0–33.0)
MCHC: 32.1 g/dL (ref 32.0–36.0)
MCV: 90.3 fL (ref 80.0–100.0)
MPV: 12 fL (ref 7.5–12.5)
Monocytes Relative: 8.4 %
Neutro Abs: 4677 cells/uL (ref 1500–7800)
Neutrophils Relative %: 63.2 %
Platelets: 172 10*3/uL (ref 140–400)
RBC: 4.83 10*6/uL (ref 3.80–5.10)
RDW: 12.9 % (ref 11.0–15.0)
Total Lymphocyte: 26.1 %
WBC: 7.4 10*3/uL (ref 3.8–10.8)

## 2021-02-27 LAB — LIPID PANEL
Cholesterol: 150 mg/dL (ref <200)
HDL: 59 mg/dL (ref >=50)
LDL Cholesterol (Calc): 68 mg/dL (calc)
Non-HDL Cholesterol (Calc): 91 mg/dL (calc) (ref <130)
Total CHOL/HDL Ratio: 2.5 (calc) (ref <5.0)
Triglycerides: 152 mg/dL — ABNORMAL HIGH (ref <150)

## 2021-02-27 LAB — HEMOGLOBIN A1C
Hgb A1c MFr Bld: 6.4 % of total Hgb — ABNORMAL HIGH (ref <5.7)
Mean Plasma Glucose: 137 mg/dL
eAG (mmol/L): 7.6 mmol/L

## 2021-03-04 ENCOUNTER — Other Ambulatory Visit: Payer: Self-pay | Admitting: Nurse Practitioner

## 2021-03-04 DIAGNOSIS — R413 Other amnesia: Secondary | ICD-10-CM

## 2021-03-04 NOTE — Telephone Encounter (Signed)
Patient has request refill on medication "Donepezil". Patient hasn't had this prescription filled by Sherrie Mustache, NP. Medication pend and sent to Marlowe Sax, NP for approval.

## 2021-03-09 ENCOUNTER — Other Ambulatory Visit: Payer: Self-pay

## 2021-03-09 ENCOUNTER — Ambulatory Visit (INDEPENDENT_AMBULATORY_CARE_PROVIDER_SITE_OTHER): Payer: Medicare PPO | Admitting: *Deleted

## 2021-03-09 DIAGNOSIS — M810 Age-related osteoporosis without current pathological fracture: Secondary | ICD-10-CM | POA: Diagnosis not present

## 2021-03-09 DIAGNOSIS — R945 Abnormal results of liver function studies: Secondary | ICD-10-CM

## 2021-03-09 DIAGNOSIS — R7989 Other specified abnormal findings of blood chemistry: Secondary | ICD-10-CM

## 2021-03-09 MED ORDER — DENOSUMAB 60 MG/ML ~~LOC~~ SOSY
60.0000 mg | PREFILLED_SYRINGE | Freq: Once | SUBCUTANEOUS | Status: AC
  Administered 2021-03-09: 60 mg via SUBCUTANEOUS

## 2021-03-10 ENCOUNTER — Encounter: Payer: Self-pay | Admitting: Nurse Practitioner

## 2021-03-10 ENCOUNTER — Ambulatory Visit (INDEPENDENT_AMBULATORY_CARE_PROVIDER_SITE_OTHER): Payer: Medicare PPO | Admitting: Nurse Practitioner

## 2021-03-10 ENCOUNTER — Telehealth: Payer: Self-pay

## 2021-03-10 DIAGNOSIS — E2839 Other primary ovarian failure: Secondary | ICD-10-CM

## 2021-03-10 DIAGNOSIS — I509 Heart failure, unspecified: Secondary | ICD-10-CM | POA: Diagnosis not present

## 2021-03-10 DIAGNOSIS — Z0000 Encounter for general adult medical examination without abnormal findings: Secondary | ICD-10-CM

## 2021-03-10 DIAGNOSIS — R531 Weakness: Secondary | ICD-10-CM

## 2021-03-10 NOTE — Progress Notes (Signed)
   This service is provided via telemedicine  No vital signs collected/recorded due to the encounter was a telemedicine visit.   Location of patient (ex: home, work):  Home  Patient consents to a telephone visit: Yes, see telephone visit dated 03/10/2021  Location of the provider (ex: office, home):  [facility], Office   Name of any referring provider:  N/A  Names of all persons participating in the telemedicine service and their role in the encounter:  S.Chrae B/CMA, Sherrie Mustache, NP, and Patient   Time spent on call:  10 min with medical assistant

## 2021-03-10 NOTE — Telephone Encounter (Signed)
[name], newborn are scheduled for a virtual visit with your provider today.    Just as we do with appointments in the office, we must obtain your consent to participate.  Your consent will be active for this visit and any virtual visit you may have with one of our providers in the next 365 days.    If you have a MyChart account, I can also send a copy of this consent to you electronically.  All virtual visits are billed to your insurance company just like a traditional visit in the office.  As this is a virtual visit, video technology does not allow for your provider to perform a traditional examination.  This may limit your provider's ability to fully assess your condition.  If your provider identifies any concerns that need to be evaluated in person or the need to arrange testing such as labs, EKG, etc, we will make arrangements to do so.    Although advances in technology are sophisticated, we cannot ensure that it will always work on either your end or our end.  If the connection with a video visit is poor, we may have to switch to a telephone visit.  With either a video or telephone visit, we are not always able to ensure that we have a secure connection.   I need to obtain your verbal consent now.   Are you willing to proceed with your visit today?   Melissa Shannon has provided verbal consent on 03/10/2021 for a virtual visit (video or telephone).   [name] [place], [place] 03/10/2021  1:40 pm

## 2021-03-10 NOTE — Progress Notes (Signed)
Subjective:   Melissa Shannon is a 87 y.o. female who presents for Medicare Annual (Subsequent) preventive examination.  Review of Systems     Cardiac Risk Factors include: advanced age (>59men, >109 women);hypertension;dyslipidemia;diabetes mellitus;sedentary lifestyle;obesity (BMI >30kg/m2)     Objective:    There were no vitals filed for this visit. There is no height or weight on file to calculate BMI.  Advanced Directives 03/10/2021 02/26/2021 10/30/2020 07/03/2020 03/06/2020 12/30/2019 02/27/2019  Does Patient Have a Medical Advance Directive? Yes Yes Yes Yes Yes Yes Yes  Type of Advance Directive Out of facility DNR (pink MOST or yellow form) Out of facility DNR (pink MOST or yellow form) Out of facility DNR (pink MOST or yellow form) Out of facility DNR (pink MOST or yellow form);[place];Living will Out of facility DNR (pink MOST or yellow form) Out of facility DNR (pink MOST or yellow form);[place];Living will Out of facility DNR (pink MOST or yellow form);Living will;Healthcare Power of Attorney  Does patient want to make changes to medical advance directive? No - Patient declined No - Patient declined No - Patient declined No - Patient declined No - Patient declined No - Patient declined -  Copy of [place] in Chart? - - - No - copy requested - No - copy requested Yes - validated most recent copy scanned in chart (See row information)  Would patient like information on creating a medical advance directive? - - - - - - -  Pre-existing out of facility DNR order (yellow form or pink MOST form) - Yellow form placed in chart (order not valid for inpatient use) Yellow form placed in chart (order not valid for inpatient use) - - Pink MOST/Yellow Form most recent copy in chart - Physician notified to receive inpatient order -    Current Medications (verified) Outpatient Encounter Medications as of 03/10/2021  Medication Sig    amLODipine (NORVASC) 5 MG tablet TAKE 1 TABLET BY MOUTH EVERY DAY   BD PEN NEEDLE NANO 2ND GEN 32G X 4 MM MISC USE AS DIRECTED ONCE DAILY   Cholecalciferol (VITAMIN D3) 5000 UNITS CHEW Chew 5,000 Units by mouth daily.    donepezil (ARICEPT) 10 MG tablet TAKE 1 TABLET BY MOUTH EVERY DAY AT BEDTIME TO PRESERVE MEMORY   ELIQUIS 5 MG TABS tablet TAKE 1 TABLET BY MOUTH TWICE A DAY   furosemide (LASIX) 20 MG tablet TAKE 1 TABLET BY MOUTH EVERY DAY   LEVEMIR FLEXTOUCH 100 UNIT/ML FlexPen INJECT 24 UNITS INTO THE SKIN DAILY   levothyroxine (SYNTHROID) 88 MCG tablet TAKE 1 TABLET BY MOUTH DAILY BEFORE BREAKFAST   Multiple Vitamin (MULTIVITAMIN WITH MINERALS) TABS tablet Take 1 tablet by mouth daily.   Nutritional Supplements (GLUCERNA SHAKE PO) Take by mouth.   ONETOUCH DELICA LANCETS 34L MISC USE EVERY OTHER DAY TO TEST BLOOD SUGAR   ONETOUCH ULTRA test strip USE DAILY TO TEST BLOOD SUGAR. DX: E11.22   rosuvastatin (CRESTOR) 40 MG tablet TAKE 1 TABLET BY MOUTH EVERY DAY FOR CHOLESTEROL   TRADJENTA 5 MG TABS tablet TAKE 1 TABLET BY MOUTH EVERY DAY   No facility-administered encounter medications on file as of 03/10/2021.    Allergies (verified) Lisinopril and Penicillins   History: Past Medical History:  Diagnosis Date   Arthritis    bilateral knees   Chronic diastolic CHF (congestive heart failure) (HCC)    CKD (chronic kidney disease), stage III (HCC)    Diabetes mellitus without complication (  [place])    Does use hearing aid    Edema    Hyperlipidemia    Hypertension    Hypothyroidism    Pulmonary embolism ([place])    Urinary incontinence    Past Surgical History:  Procedure Laterality Date   BLADDER SURGERY  2000   Dr.Tannerbaum (Bladder Tact)   CATARACT EXTRACTION  03/2013   Dr.Shapiro   CHOLECYSTECTOMY     EYE SURGERY     bilateral cataracts   [facility]    right elbow  1999   x 2, still has one piece of metal in it   [place]   Dr.Brewer   Family History  Problem Relation Age of Onset   Suicidality Father    Cancer Brother    Cancer Maternal Grandfather    Diabetes Maternal Grandmother    Melanoma Brother    Social History   Socioeconomic History   Marital status: Widowed    Spouse name: Not on file   Number of children: Not on file   Years of education: Not on file   Highest education level: Not on file  Occupational History   Occupation: Retired Pharmacist, hospital  Tobacco Use   Smoking status: Never   Smokeless tobacco: Never  Vaping Use   Vaping Use: Never used  Substance and Sexual Activity   Alcohol use: No   Drug use: No   Sexual activity: Never  Other Topics Concern   Not on file  Social History Narrative   Diet: Low sodium    Do you drink/eat things with caffeine? Yes, tea   Widowed, married 1952   Lives in a house, yes- 1 or more stories, 1 person in home. No pets    Current/past profession- Teacher    Patient exercises with walker twice daily        Social Determinants of Health   Financial Resource Strain: Not on file  Food Insecurity: Not on file  Transportation Needs: Not on file  Physical Activity: Not on file  Stress: Not on file  Social Connections: Not on file    Tobacco Counseling Counseling given: Not Answered   Clinical Intake:  Pre-visit preparation completed: Yes  Pain : No/denies pain     BMI - recorded: 31 Nutritional Status: BMI > 30  Obese Diabetes: Yes  How often do you need to have someone help you when you read instructions, pamphlets, or other written materials from your doctor or pharmacy?: 1 - Never  Diabetic?yes         Activities of Daily Living In your present state of health, do you have any difficulty performing the following activities: 03/10/2021  Hearing? Y  Vision? N  Difficulty concentrating or making decisions? Y  Walking or climbing stairs? Y  Dressing or bathing? N  Doing errands, shopping? Y  Comment  goes with someone  Conservation officer, nature and eating ? Y  Comment can not stand up long to prepare food  Using the Toilet? N  In the past six months, have you accidently leaked urine? Y  Do you have problems with loss of bowel control? N  Managing your Medications? N  Managing your Finances? N  Housekeeping or managing your Housekeeping? N  Some recent data might be hidden    Patient Care Team: Lauree Chandler, NP as PCP - General (Nurse Practitioner) Larey Dresser, MD as PCP - Advanced Heart Failure (Cardiology) Tanda Rockers, MD  as Consulting Physician (Pulmonary Disease) Larey Dresser, MD as Consulting Physician (Cardiology)  Indicate any recent Medical Services you may have received from other than [facility] providers in the past year (date may be approximate).     Assessment:   This is a routine wellness examination for [place].  Hearing/Vision screen Hearing Screening - Comments:: Wears hearing aids  Vision Screening - Comments:: Last eye exam December 2021  Dietary issues and exercise activities discussed: Current Exercise Habits: The patient does not participate in regular exercise at present   Goals Addressed   None    Depression Screen PHQ 2/9 Scores 03/10/2021 07/03/2020 03/06/2020 07/01/2019 02/27/2019 02/23/2018 02/23/2018  PHQ - 2 Score 0 0 0 0 0 0 0    Fall Risk Fall Risk  03/10/2021 02/26/2021 07/03/2020 03/06/2020 12/30/2019  Falls in the past year? 0 0 0 0 0  Number falls in past yr: 0 0 0 0 0  Injury with Fall? 0 0 0 0 0  Risk for fall due to : No Fall Risks No Fall Risks - - -  Follow up Falls evaluation completed Falls evaluation completed - - -    FALL RISK PREVENTION PERTAINING TO THE HOME:  Any stairs in or around the home? Yes  If so, are there any without handrails? Yes  Home free of loose throw rugs in walkways, pet beds, electrical cords, etc? Yes  Adequate lighting in your home to reduce risk of falls? Yes   ASSISTIVE DEVICES UTILIZED TO PREVENT  FALLS:  Life alert? Yes  Use of a cane, walker or w/c? Yes  Grab bars in the bathroom? No  Shower chair or bench in shower? Yes  Elevated toilet seat or a handicapped toilet? No   TIMED UP AND GO:  Was the test performed? No .    Cognitive Function: MMSE - Mini Mental State Exam 02/27/2019 02/23/2018 02/17/2017 02/11/2016 09/04/2014  Orientation to time 5 5 5 5 5   Orientation to Place 5 3 4 5 5   Registration 3 3 3 3 3   Attention/ Calculation 5 5 5 5 5   Recall 3 2 1 2 3   Language- name 2 objects 2 2 2 2 2   Language- repeat 1 1 1 1 1   Language- follow 3 step command 3 3 2 3 3   Language- read & follow direction 1 1 1 1 1   Write a sentence 1 1 1 1 1   Copy design 1 1 1 1 1   Total score 30 27 26 29 30      6CIT Screen 03/10/2021 03/06/2020  What Year? 0 points 0 points  What month? 0 points 0 points  What time? 0 points 0 points  Count back from 20 0 points 0 points  Months in reverse 0 points 0 points  Repeat phrase 0 points 2 points  Total Score 0 2    Immunizations Immunization History  Administered Date(s) Administered   PFIZER(Purple Top)SARS-COV-2 Vaccination 09/21/2019, 10/14/2019, 06/03/2020   Pneumococcal Polysaccharide-23 04/06/2009   Td 04/06/2009    TDAP status: Due, Education has been provided regarding the importance of this vaccine. Advised may receive this vaccine at local pharmacy or Health Dept. Aware to provide a copy of the vaccination record if obtained from local pharmacy or Health Dept. Verbalized acceptance and understanding.  Flu Vaccine status: Declined, Education has been provided regarding the importance of this vaccine but patient still declined. Advised may receive this vaccine at local pharmacy or Health Dept. Aware to provide a copy  of the vaccination record if obtained from local pharmacy or Health Dept. Verbalized acceptance and understanding.  Pneumococcal vaccine status: Declined,  Education has been provided regarding the importance of this  vaccine but patient still declined. Advised may receive this vaccine at local pharmacy or Health Dept. Aware to provide a copy of the vaccination record if obtained from local pharmacy or Health Dept. Verbalized acceptance and understanding.   Covid-19 vaccine status: Completed vaccines  Qualifies for Shingles Vaccine? Yes   Zostavax completed No   Shingrix Completed?: No.    Education has been provided regarding the importance of this vaccine. Patient has been advised to call insurance company to determine out of pocket expense if they have not yet received this vaccine. Advised may also receive vaccine at local pharmacy or Health Dept. Verbalized acceptance and understanding.  Screening Tests Health Maintenance  Topic Date Due   COVID-19 Vaccine (4 - Booster for Pfizer series) 10/04/2020   FOOT EXAM  12/29/2020   TETANUS/TDAP  02/26/2022 (Originally 04/07/2019)   PNA vac Low Risk Adult (2 of 2 - PCV13) 02/26/2022 (Originally 04/06/2010)   Zoster Vaccines- Shingrix (1 of 2) 02/26/2022 (Originally 09/30/1979)   INFLUENZA VACCINE  08/15/2048 (Originally 03/15/2021)   URINE MICROALBUMIN  06/30/2021   OPHTHALMOLOGY EXAM  07/14/2021   HEMOGLOBIN A1C  08/29/2021   DEXA SCAN  Completed   HPV VACCINES  Aged Out    Health Maintenance  Health Maintenance Due  Topic Date Due   COVID-19 Vaccine (4 - Booster for [place] series) 10/04/2020   FOOT EXAM  12/29/2020    Colorectal cancer screening: No longer required.   Mammogram status: No longer required due to aged out.  Bone Density status: Ordered today. Pt provided with contact info and advised to call to schedule appt.  Lung Cancer Screening: (Low Dose CT Chest recommended if Age 62-80 years, 30 pack-year currently smoking OR have quit w/in 15years.) does not qualify.   Lung Cancer Screening Referral: na  Additional Screening:  Hepatitis C Screening: does not qualify  Vision Screening: Recommended annual ophthalmology exams for early  detection of glaucoma and other disorders of the eye. Is the patient up to date with their annual eye exam?  Yes  Who is the provider or what is the name of the office in which the patient attends annual eye exams? Dr Gershon Crane If pt is not established with a provider, would they like to be referred to a provider to establish care? No .   Dental Screening: Recommended annual dental exams for proper oral hygiene  Community Resource Referral / Chronic Care Management: CRR required this visit?  No   CCM required this visit?  No      Plan:     I have personally reviewed and noted the following in the patient's chart:   Medical and social history Use of alcohol, tobacco or illicit drugs  Current medications and supplements including opioid prescriptions.  Functional ability and status Nutritional status Physical activity Advanced directives List of other physicians Hospitalizations, surgeries, and ER visits in previous 12 months Vitals Screenings to include cognitive, depression, and falls Referrals and appointments  In addition, I have reviewed and discussed with patient certain preventive protocols, quality metrics, and best practice recommendations. A written personalized care plan for preventive services as well as general preventive health recommendations were provided to patient.     Lauree Chandler, NP   03/10/2021    Virtual Visit via Telephone Note  I connected withNAME@  on 03/10/21 at  2:15 PM EDT by telephone and verified that I am speaking with the correct person using two identifiers.  Location: Patient: home Provider: [place]   I discussed the limitations, risks, security and privacy concerns of performing an evaluation and management service by telephone and the availability of in person appointments. I also discussed with the patient that there may be a patient responsible charge related to this service. The patient expressed understanding and agreed to  proceed.   I discussed the assessment and treatment plan with the patient. The patient was provided an opportunity to ask questions and all were answered. The patient agreed with the plan and demonstrated an understanding of the instructions.   The patient was advised to call back or seek an in-person evaluation if the symptoms worsen or if the condition fails to improve as anticipated.  I provided 15 minutes of non-face-to-face time during this encounter.  [employer]. Harle Battiest Avs printed and mailed

## 2021-03-10 NOTE — Patient Instructions (Signed)
[name] , Thank you for taking time to come for your Medicare Wellness Visit. I appreciate your ongoing commitment to your health goals. Please review the following plan we discussed and let me know if I can assist you in the future.   Screening recommendations/referrals: Colonoscopy aged out Mammogram aged out Bone Density Ordered today. Recommended yearly ophthalmology/optometry visit for glaucoma screening and checkup Recommended yearly dental visit for hygiene and checkup  Vaccinations: Influenza vaccine recommend yearly Pneumococcal vaccine recommend vaccine can get in office or at your local pharmacy. Tdap vaccine -RECOMMENDED to get at your local pharmacy Shingles vaccine RECOMMENDED to get at local pharmacy  Advanced directives: on file.   Conditions/risks identified: advanced age.   Next appointment: 1 year   Preventive Care 18 Years and Older, Female Preventive care refers to lifestyle choices and visits with your health care provider that can promote health and wellness. What does preventive care include? A yearly physical exam. This is also called an annual well check. Dental exams once or twice a year. Routine eye exams. Ask your health care provider how often you should have your eyes checked. Personal lifestyle choices, including: Daily care of your teeth and gums. Regular physical activity. Eating a healthy diet. Avoiding tobacco and drug use. Limiting alcohol use. Practicing safe sex. Taking low-dose aspirin every day. Taking vitamin and mineral supplements as recommended by your health care provider. What happens during an annual well check? The services and screenings done by your health care provider during your annual well check will depend on your age, overall health, lifestyle risk factors, and family history of disease. Counseling  Your health care provider may ask you questions about your: Alcohol use. Tobacco use. Drug use. Emotional  well-being. Home and relationship well-being. Sexual activity. Eating habits. History of falls. Memory and ability to understand (cognition). Work and work Statistician. Reproductive health. Screening  You may have the following tests or measurements: Height, weight, and BMI. Blood pressure. Lipid and cholesterol levels. These may be checked every 5 years, or more frequently if you are over 58 years old. Skin check. Lung cancer screening. You may have this screening every year starting at age 81 if you have a 30-pack-year history of smoking and currently smoke or have quit within the past 15 years. Fecal occult blood test (FOBT) of the stool. You may have this test every year starting at age 60. Flexible sigmoidoscopy or colonoscopy. You may have a sigmoidoscopy every 5 years or a colonoscopy every 10 years starting at age 76. Hepatitis C blood test. Hepatitis B blood test. Sexually transmitted disease (STD) testing. Diabetes screening. This is done by checking your blood sugar (glucose) after you have not eaten for a while (fasting). You may have this done every 1-3 years. Bone density scan. This is done to screen for osteoporosis. You may have this done starting at age 45. Mammogram. This may be done every 1-2 years. Talk to your health care provider about how often you should have regular mammograms. Talk with your health care provider about your test results, treatment options, and if necessary, the need for more tests. Vaccines  Your health care provider may recommend certain vaccines, such as: Influenza vaccine. This is recommended every year. Tetanus, diphtheria, and acellular pertussis (Tdap, Td) vaccine. You may need a Td booster every 10 years. Zoster vaccine. You may need this after age 70. Pneumococcal 13-valent conjugate (PCV13) vaccine. One dose is recommended after age 37. Pneumococcal polysaccharide (PPSV23) vaccine. One dose  is recommended after age 15. Talk to your  health care provider about which screenings and vaccines you need and how often you need them. This information is not intended to replace advice given to you by your health care provider. Make sure you discuss any questions you have with your health care provider. Document Released: 08/28/2015 Document Revised: 04/20/2016 Document Reviewed: 06/02/2015 Elsevier Interactive Patient Education  2017 [place] Prevention in the Home Falls can cause injuries. They can happen to people of all ages. There are many things you can do to make your home safe and to help prevent falls. What can I do on the outside of my home? Regularly fix the edges of walkways and driveways and fix any cracks. Remove anything that might make you trip as you walk through a door, such as a raised step or threshold. Trim any bushes or trees on the path to your home. Use bright outdoor lighting. Clear any walking paths of anything that might make someone trip, such as rocks or tools. Regularly check to see if handrails are loose or broken. Make sure that both sides of any steps have handrails. Any raised decks and porches should have guardrails on the edges. Have any leaves, snow, or ice cleared regularly. Use sand or salt on walking paths during winter. Clean up any spills in your garage right away. This includes oil or grease spills. What can I do in the bathroom? Use night lights. Install grab bars by the toilet and in the tub and shower. Do not use towel bars as grab bars. Use non-skid mats or decals in the tub or shower. If you need to sit down in the shower, use a plastic, non-slip stool. Keep the floor dry. Clean up any water that spills on the floor as soon as it happens. Remove soap buildup in the tub or shower regularly. Attach bath mats securely with double-sided non-slip rug tape. Do not have throw rugs and other things on the floor that can make you trip. What can I do in the bedroom? Use night  lights. Make sure that you have a light by your bed that is easy to reach. Do not use any sheets or blankets that are too big for your bed. They should not hang down onto the floor. Have a firm chair that has side arms. You can use this for support while you get dressed. Do not have throw rugs and other things on the floor that can make you trip. What can I do in the kitchen? Clean up any spills right away. Avoid walking on wet floors. Keep items that you use a lot in easy-to-reach places. If you need to reach something above you, use a strong step stool that has a grab bar. Keep electrical cords out of the way. Do not use floor polish or wax that makes floors slippery. If you must use wax, use non-skid floor wax. Do not have throw rugs and other things on the floor that can make you trip. What can I do with my stairs? Do not leave any items on the stairs. Make sure that there are handrails on both sides of the stairs and use them. Fix handrails that are broken or loose. Make sure that handrails are as long as the stairways. Check any carpeting to make sure that it is firmly attached to the stairs. Fix any carpet that is loose or worn. Avoid having throw rugs at the top or bottom of the stairs.  If you do have throw rugs, attach them to the floor with carpet tape. Make sure that you have a light switch at the top of the stairs and the bottom of the stairs. If you do not have them, ask someone to add them for you. What else can I do to help prevent falls? Wear shoes that: Do not have high heels. Have rubber bottoms. Are comfortable and fit you well. Are closed at the toe. Do not wear sandals. If you use a stepladder: Make sure that it is fully opened. Do not climb a closed stepladder. Make sure that both sides of the stepladder are locked into place. Ask someone to hold it for you, if possible. Clearly mark and make sure that you can see: Any grab bars or handrails. First and last  steps. Where the edge of each step is. Use tools that help you move around (mobility aids) if they are needed. These include: Canes. Walkers. Scooters. Crutches. Turn on the lights when you go into a dark area. Replace any light bulbs as soon as they burn out. Set up your furniture so you have a clear path. Avoid moving your furniture around. If any of your floors are uneven, fix them. If there are any pets around you, be aware of where they are. Review your medicines with your doctor. Some medicines can make you feel dizzy. This can increase your chance of falling. Ask your doctor what other things that you can do to help prevent falls. This information is not intended to replace advice given to you by your health care provider. Make sure you discuss any questions you have with your health care provider. Document Released: 05/28/2009 Document Revised: 01/07/2016 Document Reviewed: 09/05/2014 Elsevier Interactive Patient Education  2017 [employer].

## 2021-03-25 ENCOUNTER — Other Ambulatory Visit: Payer: Self-pay | Admitting: Nurse Practitioner

## 2021-03-25 DIAGNOSIS — E785 Hyperlipidemia, unspecified: Secondary | ICD-10-CM

## 2021-03-25 DIAGNOSIS — E1169 Type 2 diabetes mellitus with other specified complication: Secondary | ICD-10-CM

## 2021-04-11 ENCOUNTER — Other Ambulatory Visit: Payer: Self-pay | Admitting: Nurse Practitioner

## 2021-04-11 DIAGNOSIS — I509 Heart failure, unspecified: Secondary | ICD-10-CM

## 2021-04-19 ENCOUNTER — Other Ambulatory Visit: Payer: Self-pay | Admitting: Nurse Practitioner

## 2021-04-23 ENCOUNTER — Other Ambulatory Visit: Payer: Self-pay

## 2021-04-23 ENCOUNTER — Other Ambulatory Visit: Payer: Medicare PPO

## 2021-04-23 DIAGNOSIS — R945 Abnormal results of liver function studies: Secondary | ICD-10-CM

## 2021-04-23 DIAGNOSIS — R7989 Other specified abnormal findings of blood chemistry: Secondary | ICD-10-CM

## 2021-04-23 LAB — COMPLETE METABOLIC PANEL WITH GFR
AG Ratio: 1.9 (calc) (ref 1.0–2.5)
ALT: 54 U/L — ABNORMAL HIGH (ref 6–29)
AST: 55 U/L — ABNORMAL HIGH (ref 10–35)
Albumin: 3.9 g/dL (ref 3.6–5.1)
Alkaline phosphatase (APISO): 49 U/L (ref 37–153)
BUN/Creatinine Ratio: 18 (calc) (ref 6–22)
BUN: 23 mg/dL (ref 7–25)
CO2: 29 mmol/L (ref 20–32)
Calcium: 10.3 mg/dL (ref 8.6–10.4)
Chloride: 106 mmol/L (ref 98–110)
Creat: 1.28 mg/dL — ABNORMAL HIGH (ref 0.60–0.95)
Globulin: 2.1 g/dL (calc) (ref 1.9–3.7)
Glucose, Bld: 73 mg/dL (ref 65–99)
Potassium: 4.1 mmol/L (ref 3.5–5.3)
Sodium: 142 mmol/L (ref 135–146)
Total Bilirubin: 0.8 mg/dL (ref 0.2–1.2)
Total Protein: 6 g/dL — ABNORMAL LOW (ref 6.1–8.1)
eGFR: 40 mL/min/{1.73_m2} — ABNORMAL LOW (ref >=60)

## 2021-04-26 ENCOUNTER — Other Ambulatory Visit: Payer: Self-pay

## 2021-04-26 DIAGNOSIS — R945 Abnormal results of liver function studies: Secondary | ICD-10-CM

## 2021-04-26 DIAGNOSIS — R7989 Other specified abnormal findings of blood chemistry: Secondary | ICD-10-CM

## 2021-05-27 ENCOUNTER — Other Ambulatory Visit: Payer: Medicare PPO

## 2021-05-27 ENCOUNTER — Other Ambulatory Visit: Payer: Self-pay

## 2021-05-27 DIAGNOSIS — R7989 Other specified abnormal findings of blood chemistry: Secondary | ICD-10-CM

## 2021-05-28 ENCOUNTER — Other Ambulatory Visit: Payer: Self-pay | Admitting: Nurse Practitioner

## 2021-05-28 LAB — COMPLETE METABOLIC PANEL WITH GFR
AG Ratio: 1.4 (calc) (ref 1.0–2.5)
ALT: 12 U/L (ref 6–29)
AST: 19 U/L (ref 10–35)
Albumin: 3.6 g/dL (ref 3.6–5.1)
Alkaline phosphatase (APISO): 56 U/L (ref 37–153)
BUN/Creatinine Ratio: 20 (calc) (ref 6–22)
BUN: 25 mg/dL (ref 7–25)
CO2: 28 mmol/L (ref 20–32)
Calcium: 10.2 mg/dL (ref 8.6–10.4)
Chloride: 107 mmol/L (ref 98–110)
Creat: 1.28 mg/dL — ABNORMAL HIGH (ref 0.60–0.95)
Globulin: 2.5 g/dL (calc) (ref 1.9–3.7)
Glucose, Bld: 59 mg/dL — ABNORMAL LOW (ref 65–139)
Potassium: 4 mmol/L (ref 3.5–5.3)
Sodium: 144 mmol/L (ref 135–146)
Total Bilirubin: 0.6 mg/dL (ref 0.2–1.2)
Total Protein: 6.1 g/dL (ref 6.1–8.1)
eGFR: 40 mL/min/{1.73_m2} — ABNORMAL LOW (ref >=60)

## 2021-05-28 MED ORDER — ONETOUCH ULTRA VI STRP: ORAL_STRIP | 11 refills | Status: DC

## 2021-05-28 MED ORDER — ONETOUCH DELICA LANCETS 33G MISC: 3 refills | Status: DC

## 2021-05-28 NOTE — Telephone Encounter (Signed)
Patient requested refill

## 2021-05-28 NOTE — Addendum Note (Signed)
Addended by: Rafael Bihari A on: 05/28/2021 04:45 PM   Modules accepted: Orders

## 2021-06-05 ENCOUNTER — Other Ambulatory Visit: Payer: Self-pay | Admitting: Nurse Practitioner

## 2021-06-24 ENCOUNTER — Emergency Department (HOSPITAL_COMMUNITY)
Admission: EM | Admit: 2021-06-24 | Discharge: 2021-06-24 | Disposition: A | Discharge status: Home / Self Care | Payer: Medicare PPO | Attending: Student | Admitting: Student

## 2021-06-24 ENCOUNTER — Telehealth: Payer: Self-pay | Admitting: Nurse Practitioner

## 2021-06-24 ENCOUNTER — Other Ambulatory Visit: Payer: Self-pay

## 2021-06-24 DIAGNOSIS — N184 Chronic kidney disease, stage 4 (severe): Secondary | ICD-10-CM | POA: Diagnosis not present

## 2021-06-24 DIAGNOSIS — R4182 Altered mental status, unspecified: Secondary | ICD-10-CM | POA: Insufficient documentation

## 2021-06-24 DIAGNOSIS — I5032 Chronic diastolic (congestive) heart failure: Secondary | ICD-10-CM | POA: Insufficient documentation

## 2021-06-24 DIAGNOSIS — Z794 Long term (current) use of insulin: Secondary | ICD-10-CM | POA: Insufficient documentation

## 2021-06-24 DIAGNOSIS — E162 Hypoglycemia, unspecified: Secondary | ICD-10-CM | POA: Insufficient documentation

## 2021-06-24 DIAGNOSIS — Z7901 Long term (current) use of anticoagulants: Secondary | ICD-10-CM | POA: Diagnosis not present

## 2021-06-24 DIAGNOSIS — Z79899 Other long term (current) drug therapy: Secondary | ICD-10-CM | POA: Diagnosis not present

## 2021-06-24 DIAGNOSIS — E039 Hypothyroidism, unspecified: Secondary | ICD-10-CM | POA: Diagnosis not present

## 2021-06-24 DIAGNOSIS — I130 Hypertensive heart and chronic kidney disease with heart failure and stage 1 through stage 4 chronic kidney disease, or unspecified chronic kidney disease: Secondary | ICD-10-CM | POA: Insufficient documentation

## 2021-06-24 LAB — CBG MONITORING, ED
Glucose-Capillary: 92 mg/dL (ref 70–99)
Glucose-Capillary: 108 mg/dL — ABNORMAL HIGH (ref 70–99)

## 2021-06-24 NOTE — ED Provider Notes (Signed)
[facility] EMERGENCY DEPARTMENT Provider Note   CSN: [id number] Arrival date & time: 06/24/21  1011     History Chief Complaint  Patient presents with   Altered Mental Status    Remell Giaimo is a 87 y.o. female with PMH CHF, CKD 3, DM on insulin, HTN, hypothyroidism who presents the emergency department for evaluation of altered mental status.  Patient states that she took her insulin last night and she manages all of her own insulin.  Patient states that this morning she awoke at home with EMS attempting to wake her at bedside.  Family states that the patient was lethargic with altered mental status and bedside glucose was 59.  She was given Ensure and upon arrival in the emergency department her blood sugar was 103 and she has returned to normal mental status baseline.  She denies chest pain, shortness of breath, Donnell pain, nausea, vomiting, fevers or any other systemic symptoms.   Altered Mental Status Presenting symptoms: confusion   Associated symptoms: no abdominal pain, no fever, no palpitations, no rash, no seizures and no vomiting       Past Medical History:  Diagnosis Date   Arthritis    bilateral knees   Chronic diastolic CHF (congestive heart failure) (HCC)    CKD (chronic kidney disease), stage III (HCC)    Diabetes mellitus without complication ([place])    Does use hearing aid    Edema    Hyperlipidemia    Hypertension    Hypothyroidism    Pulmonary embolism ([place])    Urinary incontinence     Patient Active Problem List   Diagnosis Date Noted   Dyspnea 08/30/2018   Upper airway cough syndrome 08/30/2018   CKD (chronic kidney disease), stage III (HCC)    Tachycardia 02/27/2018   DOE (dyspnea on exertion) 02/27/2018   Chronic diastolic CHF (congestive heart failure) ([place]) 03/05/2016   CKD (chronic kidney disease) stage 4, GFR 15-29 ml/min ([place]) 03/19/2015   General weakness 11/29/2014   UTI (urinary tract infection) 11/29/2014    Diabetes mellitus with renal manifestations, uncontrolled 11/29/2014   Aphasia 11/28/2014   Multiple pulmonary nodules 06/14/2014   Malnutrition of moderate degree ([place]) 06/12/2014   Hypertension 06/11/2014   Hyperlipemia 06/11/2014   CHF (congestive heart failure) ([place]) 06/11/2014    Past Surgical History:  Procedure Laterality Date   BLADDER SURGERY  2000   Dr.Tannerbaum (Bladder Tact)   CATARACT EXTRACTION  03/2013   Dr.Shapiro   CHOLECYSTECTOMY     EYE SURGERY     bilateral cataracts   PILONIDAL CYST EXCISION  [facility]    right elbow  1999   x 2, still has one piece of metal in it   [place]   Dr.Brewer     OB History   No obstetric history on file.     Family History  Problem Relation Age of Onset   Suicidality Father    Cancer Brother    Cancer Maternal Grandfather    Diabetes Maternal Grandmother    Melanoma Brother     Social History   Tobacco Use   Smoking status: Never   Smokeless tobacco: Never  Vaping Use   Vaping Use: Never used  Substance Use Topics   Alcohol use: No   Drug use: No    Home Medications Prior to Admission medications   Medication Sig Start Date End Date Taking? Authorizing Provider  amLODipine (NORVASC) 5 MG tablet  TAKE 1 TABLET BY MOUTH EVERY DAY 04/20/21   Lauree Chandler, NP  Cholecalciferol (VITAMIN D3) 5000 UNITS CHEW Chew 5,000 Units by mouth daily.     [provider]  donepezil (ARICEPT) 10 MG tablet TAKE 1 TABLET BY MOUTH EVERY DAY AT BEDTIME TO PRESERVE MEMORY 03/04/21   Ngetich, Dinah C, NP  ELIQUIS 5 MG TABS tablet TAKE 1 TABLET BY MOUTH TWICE A DAY 12/14/20   Lauree Chandler, NP  furosemide (LASIX) 20 MG tablet TAKE 1 TABLET BY MOUTH EVERY DAY 04/12/21   Lauree Chandler, NP  Insulin Pen Needle (BD PEN NEEDLE NANO 2ND GEN) 32G X 4 MM MISC 1 Device by Other route daily. DX [id number] 06/07/21   Lauree Chandler, NP  LEVEMIR FLEXTOUCH 100 UNIT/ML FlexPen INJECT 24  UNITS INTO THE SKIN DAILY 07/20/20   Lauree Chandler, NP  levothyroxine (SYNTHROID) 88 MCG tablet TAKE 1 TABLET BY MOUTH DAILY BEFORE BREAKFAST 02/01/21   Lauree Chandler, NP  Multiple Vitamin (MULTIVITAMIN WITH MINERALS) TABS tablet Take 1 tablet by mouth daily. 06/19/14   Regalado, Belkys A, MD  Nutritional Supplements (GLUCERNA SHAKE PO) Take by mouth.    [provider]  OneTouch Delica Lancets 62I MISC USE EVERY OTHER DAY TO TEST BLOOD SUGAR 05/28/21   Lauree Chandler, NP  [facility] ULTRA test strip USE DAILY TO TEST BLOOD SUGAR. DX: E11.22 05/28/21   Lauree Chandler, NP  TRADJENTA 5 MG TABS tablet TAKE 1 TABLET BY MOUTH EVERY DAY 11/16/20   Lauree Chandler, NP    Allergies    Lisinopril and Penicillins  Review of Systems   Review of Systems  Constitutional:  Negative for chills and fever.  HENT:  Negative for ear pain and sore throat.   Eyes:  Negative for pain and visual disturbance.  Respiratory:  Negative for cough and shortness of breath.   Cardiovascular:  Negative for chest pain and palpitations.  Gastrointestinal:  Negative for abdominal pain and vomiting.  Genitourinary:  Negative for dysuria and hematuria.  Musculoskeletal:  Negative for arthralgias and back pain.  Skin:  Negative for color change and rash.  Neurological:  Negative for seizures and syncope.  Psychiatric/Behavioral:  Positive for confusion.   All other systems reviewed and are negative.  Physical Exam Updated Vital Signs BP 139/77 (BP Location: Left Arm)   Pulse (!) 57   Temp (!) 97.4 F (36.3 C) (Oral)   Resp 20   Ht 5\' 8"  (1.727 m)   Wt 94 kg   SpO2 99%   BMI 31.51 kg/m   Physical Exam Vitals and nursing note reviewed.  Constitutional:      General: She is not in acute distress.    Appearance: She is well-developed.  HENT:     Head: Normocephalic and atraumatic.  Eyes:     Conjunctiva/sclera: Conjunctivae normal.  Cardiovascular:     Rate and Rhythm: Normal rate and  regular rhythm.     Heart sounds: No murmur heard. Pulmonary:     Effort: Pulmonary effort is normal. No respiratory distress.     Breath sounds: Normal breath sounds.  Abdominal:     Palpations: Abdomen is soft.     Tenderness: There is no abdominal tenderness.  Musculoskeletal:     Cervical back: Neck supple.  Skin:    General: Skin is warm and dry.  Neurological:     Mental Status: She is alert.    ED Results / Procedures /  Treatments   Labs (all labs ordered are listed, but only abnormal results are displayed) Labs Reviewed  CBG MONITORING, ED - Abnormal; Notable for the following components:      Result Value   Glucose-Capillary 108 (*)    All other components within normal limits  CBG MONITORING, ED    EKG None  Radiology No results found.  Procedures Procedures   Medications Ordered in ED Medications - No data to display  ED Course  I have reviewed the triage vital signs and the nursing notes.  Pertinent labs & imaging results that were available during my care of the patient were reviewed by me and considered in my medical decision making (see chart for details).    MDM Rules/Calculators/A&P                           Patient seen emergency department for evaluation of altered mental status in the setting of hypoglycemia.  On physical exam, patient has returned to normal mental status baseline and her overall physical exam is unremarkable.  Repeat blood sugar in the 90s.  I had a discussion with the patient about pursuing additional lab work-up and using shared decision-making, we decided that we would not pursue additional lab work-up as her symptoms have completely resolved.  Her presentation is consistent with likely medication induced hypoglycemia and after observation in the emergency department repeat blood sugar remains normoglycemic.  I discussion with the patient and her daughter at bedside stating that she needs to call her primary care doctor to  discuss the events of today and the patient was then discharged. Final Clinical Impression(s) / ED Diagnoses Final diagnoses:  Hypoglycemia    Rx / DC Orders ED Discharge Orders     None        Suleman Gunning, Debe Coder, MD 06/24/21 1523

## 2021-06-24 NOTE — ED Triage Notes (Signed)
Pt was reported to have slurred speech and "not making any sense," also weakness by caretaker, when EMS was called her CBG was 59 and pt was given glucose tablets and sugar went up to 103, AMS resolved but pt still reports feeling "jerky" but otherwise normal

## 2021-06-24 NOTE — Telephone Encounter (Signed)
Pt needs routine follow up scheduled in December Left VM for patient to call office back to schedule.

## 2021-06-26 ENCOUNTER — Other Ambulatory Visit: Payer: Self-pay | Admitting: Adult Health

## 2021-06-26 ENCOUNTER — Telehealth: Payer: Self-pay | Admitting: Adult Health

## 2021-06-26 MED ORDER — ONETOUCH DELICA LANCETS 33G MISC: 3 refills | Status: DC

## 2021-06-26 MED ORDER — ONETOUCH ULTRA VI STRP
ORAL_STRIP | 11 refills | Status: DC
Start: 2021-06-26 — End: 2021-07-19

## 2021-06-28 ENCOUNTER — Other Ambulatory Visit: Payer: Self-pay

## 2021-06-28 ENCOUNTER — Encounter: Payer: Self-pay | Admitting: Nurse Practitioner

## 2021-06-28 ENCOUNTER — Ambulatory Visit: Payer: Medicare PPO | Admitting: Nurse Practitioner

## 2021-06-28 VITALS — BP 122/78 | HR 85 | Temp 97.8°F | Ht 68.0 in | Wt 206.0 lb

## 2021-06-28 DIAGNOSIS — N1832 Chronic kidney disease, stage 3b: Secondary | ICD-10-CM

## 2021-06-28 DIAGNOSIS — E162 Hypoglycemia, unspecified: Secondary | ICD-10-CM | POA: Diagnosis not present

## 2021-06-28 DIAGNOSIS — E1122 Type 2 diabetes mellitus with diabetic chronic kidney disease: Secondary | ICD-10-CM

## 2021-06-28 DIAGNOSIS — Z794 Long term (current) use of insulin: Secondary | ICD-10-CM

## 2021-06-28 MED ORDER — LEVEMIR FLEXTOUCH 100 UNIT/ML ~~LOC~~ SOPN: 7.0000 [IU] | PEN_INJECTOR | Freq: Every day | SUBCUTANEOUS | 5 refills | Status: DC

## 2021-06-28 NOTE — Progress Notes (Signed)
Careteam: Patient Care Team: Lauree Chandler, NP as PCP - General (Nurse Practitioner) Larey Dresser, MD as PCP - Advanced Heart Failure (Cardiology) Tanda Rockers, MD as Consulting Physician (Pulmonary Disease) Larey Dresser, MD as Consulting Physician (Cardiology)  PLACE OF SERVICE:  [place]  Advanced Directive information    Allergies  Allergen Reactions   Lisinopril Shortness Of Breath    Tracheal Edema   Penicillins Other (See Comments)    DID THE REACTION INVOLVE: Swelling of the face/tongue/throat, SOB, or low BP? Yes Sudden or severe rash/hives, skin peeling, or the inside of the mouth or nose? No Did it require medical treatment? No When did it last happen?      more than 10 years  If all above answers are "NO", may proceed with cephalosporin use.   Throat felt tight    Chief Complaint  Patient presents with   Acute Visit    Patient c/o syncope and low blood sugar episodes, both episode happened in the morning. Here with daughter Eustaquio Maize. Beth found mom unresponsive (2nd episode). Patient c/o left leg pain/discomfort shortly after episode which has now resolved.      HPI: Patient is a 87 y.o. female to follow up hypoglycemia.  She went to ED due to hypoglycemia on 06/24/21.  She was discharged home and daughter called and she did not answer. So her daughter came over ad she was incoherent. She looked like she was in distress. Had to have her leg over her head.  She gave her glucose tablet and some juice.  Called the on-call on Saturday and she suggested she decrease insulin to 14 units from 28 units.  Has been checking blood sugars very frequently since. No hypoglycemia but blood sugars in 70s even when she has eaten.   Her glucose meter was not working correctly and she was previously not checking blood sugars but now has a new meter.   Overall she is eating less. Reports she is going to try to eat more often since she eats less when she does.    Review of Systems:  Review of Systems  Constitutional:  Negative for chills, fever and weight loss.  HENT:  Negative for tinnitus.   Respiratory:  Negative for cough, sputum production and shortness of breath.   Cardiovascular:  Negative for chest pain, palpitations and leg swelling.  Gastrointestinal:  Negative for abdominal pain, constipation, diarrhea and heartburn.  Genitourinary:  Negative for dysuria, frequency and urgency.  Musculoskeletal:  Negative for back pain, falls, joint pain and myalgias.  Skin: Negative.   Neurological:  Negative for dizziness and headaches.   Past Medical History:  Diagnosis Date   Arthritis    bilateral knees   Chronic diastolic CHF (congestive heart failure) (HCC)    CKD (chronic kidney disease), stage III (HCC)    Diabetes mellitus without complication ([place])    Does use hearing aid    Edema    Hyperlipidemia    Hypertension    Hypothyroidism    Pulmonary embolism ([place])    Urinary incontinence    Past Surgical History:  Procedure Laterality Date   BLADDER SURGERY  2000   Dr.Tannerbaum (Bladder Tact)   CATARACT EXTRACTION  03/2013   Dr.Shapiro   CHOLECYSTECTOMY     EYE SURGERY     bilateral cataracts   [facility]    right elbow  1999   x 2, still has one piece  of metal in it   [place]   Dr.Brewer   Social History:   reports that she has never smoked. She has never used smokeless tobacco. She reports that she does not drink alcohol and does not use drugs.  Family History  Problem Relation Age of Onset   Suicidality Father    Cancer Brother    Cancer Maternal Grandfather    Diabetes Maternal Grandmother    Melanoma Brother     Medications: Patient's Medications  New Prescriptions   No medications on file  Previous Medications   AMLODIPINE (NORVASC) 5 MG TABLET    TAKE 1 TABLET BY MOUTH EVERY DAY   CHOLECALCIFEROL (VITAMIN D3) 5000 UNITS CHEW    Chew 5,000  Units by mouth daily.    DONEPEZIL (ARICEPT) 10 MG TABLET    TAKE 1 TABLET BY MOUTH EVERY DAY AT BEDTIME TO PRESERVE MEMORY   ELIQUIS 5 MG TABS TABLET    TAKE 1 TABLET BY MOUTH TWICE A DAY   FUROSEMIDE (LASIX) 20 MG TABLET    TAKE 1 TABLET BY MOUTH EVERY DAY   INSULIN DETEMIR (LEVEMIR FLEXTOUCH) 100 UNIT/ML FLEXPEN    Inject 14 Units into the skin daily.   INSULIN PEN NEEDLE (BD PEN NEEDLE NANO 2ND GEN) 32G X 4 MM MISC    1 Device by Other route daily. DX [id number]   LEVOTHYROXINE (SYNTHROID) 88 MCG TABLET    TAKE 1 TABLET BY MOUTH DAILY BEFORE BREAKFAST   MULTIPLE VITAMIN (MULTIVITAMIN WITH MINERALS) TABS TABLET    Take 1 tablet by mouth daily.   NUTRITIONAL SUPPLEMENTS (GLUCERNA SHAKE PO)    Take by mouth.   ONETOUCH DELICA LANCETS 25W MISC    USE EVERY OTHER DAY TO TEST BLOOD SUGAR   ONETOUCH ULTRA TEST STRIP    USE DAILY TO TEST BLOOD SUGAR. DX: E11.22   TRADJENTA 5 MG TABS TABLET    TAKE 1 TABLET BY MOUTH EVERY DAY  Modified Medications   No medications on file  Discontinued Medications   LEVEMIR FLEXTOUCH 100 UNIT/ML FLEXPEN    INJECT 24 UNITS INTO THE SKIN DAILY    Physical Exam:  Vitals:   06/28/21 1516  BP: 122/78  Pulse: 85  Temp: 97.8 F (36.6 C)  TempSrc: Temporal  SpO2: 96%  Weight: 206 lb (93.4 kg)  Height: _0  (1.727 m)   Body mass index is 31.32 kg/m. Wt Readings from Last 3 Encounters:  06/28/21 206 lb (93.4 kg)  06/24/21 207 lb 3.7 oz (94 kg)  02/26/21 207 lb 6.4 oz (94.1 kg)    Physical Exam Constitutional:      General: She is not in acute distress.    Appearance: She is well-developed. She is not diaphoretic.  HENT:     Head: Normocephalic and atraumatic.     Mouth/Throat:     Pharynx: No oropharyngeal exudate.  Eyes:     Conjunctiva/sclera: Conjunctivae normal.     Pupils: Pupils are equal, round, and reactive to light.  Cardiovascular:     Rate and Rhythm: Normal rate and regular rhythm.     Heart sounds: Normal heart sounds.  Pulmonary:      Effort: Pulmonary effort is normal.     Breath sounds: Normal breath sounds.  Abdominal:     General: Bowel sounds are normal.     Palpations: Abdomen is soft.  Musculoskeletal:     Cervical back: Normal range of motion and neck supple.  Right lower leg: No edema.     Left lower leg: No edema.  Skin:    General: Skin is warm and dry.  Neurological:     Mental Status: She is alert.  Psychiatric:        Mood and Affect: Mood normal.    Labs reviewed: Basic Metabolic Panel: Recent Labs    09/15/20 1429 02/26/21 1105 04/23/21 0951 05/27/21 0933  NA 141 143 142 144  K 4.4 4.2 4.1 4.0  CL 103 106 106 107  CO2 _0 GLUCOSE 112* 77 73 59*  BUN 26* 31* 23 25  CREATININE 1.51* 1.31* 1.28* 1.28*  CALCIUM 10.8* 10.7* 10.3 10.2  TSH 2.076  --   --   --    Liver Function Tests: Recent Labs    02/26/21 1105 04/23/21 0951 05/27/21 0933  AST 38* 55* 19  ALT 41* 54* 12  BILITOT 0.6 0.8 0.6  PROT 6.0* 6.0* 6.1   No results for input(s): LIPASE, AMYLASE in the last 8760 hours. No results for input(s): AMMONIA in the last 8760 hours. CBC: Recent Labs    06/29/20 0000 09/15/20 1429 02/26/21 1105  WBC 8.6 7.5 7.4  NEUTROABS 5,513  --  4,677  HGB 13.8 14.3 14.0  HCT 43.0 44.0 43.6  MCV 91.5 90.7 90.3  PLT 223 195 172   Lipid Panel: Recent Labs    06/29/20 0000 02/26/21 1105  CHOL 144 150  HDL 65 59  LDLCALC 55 68  TRIG 164* 152*  CHOLHDL 2.2 2.5   TSH: Recent Labs    09/15/20 1429  TSH 2.076   A1C: Lab Results  Component Value Date   HGBA1C 6.4 (H) 02/26/2021     Assessment/Plan 1. Hypoglycemia -will decrease lantus down to 7 units at this time. To continue to check blood sugars to monitor. To make sure to eat proper protein AND nutrition throughout the day. Daughter plans to stay with her over the next few days to make sure she is doing well.  - Hemoglobin A1c - CMP with eGFR([facility]) - CBC with Differential/Platelet  2. Type 2 diabetes  mellitus with stage 3b chronic kidney disease, with long-term current use of insulin ([place]) -continues on tradjenta but will reduce lantus again. Continue routine monitoring of blood suGAR - Hemoglobin A1c - CMP with eGFR([facility]) - CBC with Differential/Platelet - insulin detemir (LEVEMIR FLEXTOUCH) 100 UNIT/ML FlexPen; Inject 7 Units into the skin at bedtime.  Dispense: 15 mL; Refill: 5   Next appt: 07/12/2021 as scheduled.  Carlos American. [place], [facility] [phone]

## 2021-06-28 NOTE — Patient Instructions (Signed)
To decrease levemir to 7 units  To take blood sugar twice daily (one in the morning and then prior to meal or bedtime)   To eat a bedtime snack with protein

## 2021-06-29 LAB — COMPLETE METABOLIC PANEL WITH GFR
AG Ratio: 1.6 (calc) (ref 1.0–2.5)
ALT: 24 U/L (ref 6–29)
AST: 39 U/L — ABNORMAL HIGH (ref 10–35)
Albumin: 3.9 g/dL (ref 3.6–5.1)
Alkaline phosphatase (APISO): 55 U/L (ref 37–153)
BUN/Creatinine Ratio: 21 (calc) (ref 6–22)
BUN: 33 mg/dL — ABNORMAL HIGH (ref 7–25)
CO2: 29 mmol/L (ref 20–32)
Calcium: 10.4 mg/dL (ref 8.6–10.4)
Chloride: 102 mmol/L (ref 98–110)
Creat: 1.54 mg/dL — ABNORMAL HIGH (ref 0.60–0.95)
Globulin: 2.4 g/dL (calc) (ref 1.9–3.7)
Glucose, Bld: 103 mg/dL (ref 65–139)
Potassium: 4.1 mmol/L (ref 3.5–5.3)
Sodium: 142 mmol/L (ref 135–146)
Total Bilirubin: 0.6 mg/dL (ref 0.2–1.2)
Total Protein: 6.3 g/dL (ref 6.1–8.1)
eGFR: 32 mL/min/{1.73_m2} — ABNORMAL LOW (ref >=60)

## 2021-06-29 LAB — CBC WITH DIFFERENTIAL/PLATELET
Absolute Monocytes: 791 cells/uL (ref 200–950)
Basophils Absolute: 74 cells/uL (ref 0–200)
Basophils Relative: 0.8 %
Eosinophils Absolute: 149 cells/uL (ref 15–500)
Eosinophils Relative: 1.6 %
HCT: 46.2 % — ABNORMAL HIGH (ref 35.0–45.0)
Hemoglobin: 14.9 g/dL (ref 11.7–15.5)
Lymphs Abs: 2734 cells/uL (ref 850–3900)
MCH: 29.3 pg (ref 27.0–33.0)
MCHC: 32.3 g/dL (ref 32.0–36.0)
MCV: 90.9 fL (ref 80.0–100.0)
MPV: 12.8 fL — ABNORMAL HIGH (ref 7.5–12.5)
Monocytes Relative: 8.5 %
Neutro Abs: 5552 cells/uL (ref 1500–7800)
Neutrophils Relative %: 59.7 %
Platelets: 230 10*3/uL (ref 140–400)
RBC: 5.08 10*6/uL (ref 3.80–5.10)
RDW: 13.1 % (ref 11.0–15.0)
Total Lymphocyte: 29.4 %
WBC: 9.3 10*3/uL (ref 3.8–10.8)

## 2021-06-29 LAB — HEMOGLOBIN A1C
Hgb A1c MFr Bld: 5.9 % of total Hgb — ABNORMAL HIGH (ref <5.7)
Mean Plasma Glucose: 123 mg/dL
eAG (mmol/L): 6.8 mmol/L

## 2021-07-01 ENCOUNTER — Other Ambulatory Visit: Payer: Self-pay | Admitting: Nurse Practitioner

## 2021-07-02 ENCOUNTER — Other Ambulatory Visit: Payer: Self-pay | Admitting: Nurse Practitioner

## 2021-07-02 DIAGNOSIS — E034 Atrophy of thyroid (acquired): Secondary | ICD-10-CM

## 2021-07-12 ENCOUNTER — Ambulatory Visit: Payer: Medicare PPO | Admitting: Nurse Practitioner

## 2021-07-12 ENCOUNTER — Encounter: Payer: Self-pay | Admitting: Nurse Practitioner

## 2021-07-12 ENCOUNTER — Other Ambulatory Visit: Payer: Self-pay

## 2021-07-12 VITALS — BP 120/80 | Temp 97.3°F | Ht 68.0 in | Wt 207.8 lb

## 2021-07-12 DIAGNOSIS — E1122 Type 2 diabetes mellitus with diabetic chronic kidney disease: Secondary | ICD-10-CM | POA: Diagnosis not present

## 2021-07-12 DIAGNOSIS — E6609 Other obesity due to excess calories: Secondary | ICD-10-CM

## 2021-07-12 DIAGNOSIS — N1832 Chronic kidney disease, stage 3b: Secondary | ICD-10-CM

## 2021-07-12 DIAGNOSIS — Z6831 Body mass index (BMI) 31.0-31.9, adult: Secondary | ICD-10-CM

## 2021-07-12 DIAGNOSIS — Z794 Long term (current) use of insulin: Secondary | ICD-10-CM

## 2021-07-12 NOTE — Patient Instructions (Signed)
Okay to check blood sugar twice daily  Goal fasting blood sugar 100-150 After meals <250

## 2021-07-12 NOTE — Progress Notes (Signed)
Careteam: Patient Care Team: Lauree Chandler, NP as PCP - General (Nurse Practitioner) Larey Dresser, MD as PCP - Advanced Heart Failure (Cardiology) Tanda Rockers, MD as Consulting Physician (Pulmonary Disease) Larey Dresser, MD as Consulting Physician (Cardiology)  PLACE OF SERVICE:  [place]  Advanced Directive information    Allergies  Allergen Reactions   Lisinopril Shortness Of Breath    Tracheal Edema   Penicillins Other (See Comments)    DID THE REACTION INVOLVE: Swelling of the face/tongue/throat, SOB, or low BP? Yes Sudden or severe rash/hives, skin peeling, or the inside of the mouth or nose? No Did it require medical treatment? No When did it last happen?      more than 10 years  If all above answers are "NO", may proceed with cephalosporin use.   Throat felt tight    Chief Complaint  Patient presents with   Follow-up    Follow up since changing insulin dose. Patient states that she seems to be doing fine with the decrease in the insulin dose. Patient has not had anymore low blood sugar episodes.   Health Maintenance    Pneumonia, 2nd COVID booster, foot exam, urine microalbumin     HPI: Patient is a 87 y.o. female for follow up on blood sugars.  Levemir was decreased to 7 unit daily after last visit due to ongoing low blood sugar and hypoglycemia. There has been no episodes of hypoglycemia since last OV. Checking cbgs 4 times daily. Daughter has been staying with her.  Blood sugars have been maintaining over 100.  Fating 112 112 160 157 [id number] 105 117 Continues to take trajenta 5 mg daily.  Reports she is feeling much stronger.  Eating more regularly and trying to increase protein.   Review of Systems:  Review of Systems  Constitutional:  Negative for chills, fever and weight loss.  HENT:  Negative for tinnitus.   Respiratory:  Negative for cough, sputum production and shortness of breath.   Cardiovascular:  Negative for chest pain,  palpitations and leg swelling.  Gastrointestinal:  Negative for abdominal pain, constipation, diarrhea and heartburn.  Genitourinary:  Negative for dysuria, frequency and urgency.  Musculoskeletal:  Negative for back pain, falls, joint pain and myalgias.  Skin: Negative.   Neurological:  Negative for dizziness and headaches.  Psychiatric/Behavioral:  Negative for depression and memory loss. The patient does not have insomnia.    Past Medical History:  Diagnosis Date   Arthritis    bilateral knees   Chronic diastolic CHF (congestive heart failure) (HCC)    CKD (chronic kidney disease), stage III ([place])    Diabetes mellitus without complication ([place])    Does use hearing aid    Edema    Hyperlipidemia    Hypertension    Hypothyroidism    Pulmonary embolism ([place])    Urinary incontinence    Past Surgical History:  Procedure Laterality Date   BLADDER SURGERY  2000   Dr.Tannerbaum (Bladder Tact)   CATARACT EXTRACTION  03/2013   Dr.Shapiro   CHOLECYSTECTOMY     EYE SURGERY     bilateral cataracts   [facility]    right elbow  1999   x 2, still has one piece of metal in it   [place]   Dr.Brewer   Social History:   reports that she has never smoked. She has never used smokeless tobacco. She reports that she  does not drink alcohol and does not use drugs.  Family History  Problem Relation Age of Onset   Suicidality Father    Cancer Brother    Cancer Maternal Grandfather    Diabetes Maternal Grandmother    Melanoma Brother     Medications: Patient's Medications  New Prescriptions   No medications on file  Previous Medications   AMLODIPINE (NORVASC) 5 MG TABLET    TAKE 1 TABLET BY MOUTH EVERY DAY   CHOLECALCIFEROL (VITAMIN D3) 5000 UNITS CHEW    Chew 5,000 Units by mouth daily.    DONEPEZIL (ARICEPT) 10 MG TABLET    TAKE 1 TABLET BY MOUTH EVERY DAY AT BEDTIME TO PRESERVE MEMORY   ELIQUIS 5 MG TABS TABLET     TAKE 1 TABLET BY MOUTH TWICE A DAY   FUROSEMIDE (LASIX) 20 MG TABLET    TAKE 1 TABLET BY MOUTH EVERY DAY   INSULIN DETEMIR (LEVEMIR FLEXTOUCH) 100 UNIT/ML FLEXPEN    Inject 7 Units into the skin at bedtime.   INSULIN PEN NEEDLE (BD PEN NEEDLE NANO 2ND GEN) 32G X 4 MM MISC    1 Device by Other route daily. DX IMO0002   LEVOTHYROXINE (SYNTHROID) 88 MCG TABLET    TAKE 1 TABLET BY MOUTH EVERY DAY BEFORE BREAKFAST   LINAGLIPTIN (TRADJENTA) 5 MG TABS TABLET    TAKE 1 TABLET BY MOUTH EVERY DAY   MULTIPLE VITAMIN (MULTIVITAMIN WITH MINERALS) TABS TABLET    Take 1 tablet by mouth daily.   NUTRITIONAL SUPPLEMENTS (GLUCERNA SHAKE PO)    Take by mouth.   ONETOUCH DELICA LANCETS 22W MISC    USE EVERY OTHER DAY TO TEST BLOOD SUGAR   ONETOUCH ULTRA TEST STRIP    USE DAILY TO TEST BLOOD SUGAR. DX: E11.22  Modified Medications   No medications on file  Discontinued Medications   No medications on file    Physical Exam:  Vitals:   07/12/21 1458  BP: 120/80  Temp: (!) 97.3 F (36.3 C)  TempSrc: Temporal  SpO2: 95%  Weight: 207 lb 12.8 oz (94.3 kg)  Height: 5\' 8"  (1.727 m)   Body mass index is 31.6 kg/m. Wt Readings from Last 3 Encounters:  07/12/21 207 lb 12.8 oz (94.3 kg)  06/28/21 206 lb (93.4 kg)  06/24/21 207 lb 3.7 oz (94 kg)    Physical Exam Constitutional:      General: She is not in acute distress.    Appearance: She is well-developed. She is not diaphoretic.  HENT:     Head: Normocephalic and atraumatic.     Mouth/Throat:     Pharynx: No oropharyngeal exudate.  Eyes:     Conjunctiva/sclera: Conjunctivae normal.     Pupils: Pupils are equal, round, and reactive to light.  Cardiovascular:     Rate and Rhythm: Normal rate and regular rhythm.     Heart sounds: Normal heart sounds.  Pulmonary:     Effort: Pulmonary effort is normal.     Breath sounds: Normal breath sounds.  Abdominal:     General: Bowel sounds are normal.     Palpations: Abdomen is soft.  Musculoskeletal:      Cervical back: Normal range of motion and neck supple.     Right lower leg: No edema.     Left lower leg: No edema.  Skin:    General: Skin is warm and dry.  Neurological:     Mental Status: She is alert.  Psychiatric:  Mood and Affect: Mood normal.    Labs reviewed: Basic Metabolic Panel: Recent Labs    09/15/20 1429 02/26/21 1105 04/23/21 0951 05/27/21 0933 06/28/21 1604  NA 141   < > 142 144 142  K 4.4   < > 4.1 4.0 4.1  CL 103   < > 106 107 102  CO2 27   < > 29 28 29   GLUCOSE 112*   < > 73 59* 103  BUN 26*   < > 23 25 33*  CREATININE 1.51*   < > 1.28* 1.28* 1.54*  CALCIUM 10.8*   < > 10.3 10.2 10.4  TSH 2.076  --   --   --   --    < > = values in this interval not displayed.   Liver Function Tests: Recent Labs    04/23/21 0951 05/27/21 0933 06/28/21 1604  AST 55* 19 39*  ALT 54* 12 24  BILITOT 0.8 0.6 0.6  PROT 6.0* 6.1 6.3   No results for input(s): LIPASE, AMYLASE in the last 8760 hours. No results for input(s): AMMONIA in the last 8760 hours. CBC: Recent Labs    09/15/20 1429 02/26/21 1105 06/28/21 1604  WBC 7.5 7.4 9.3  NEUTROABS  --  4,677 5,552  HGB 14.3 14.0 14.9  HCT 44.0 43.6 46.2*  MCV 90.7 90.3 90.9  PLT 195 172 230   Lipid Panel: Recent Labs    02/26/21 1105  CHOL 150  HDL 59  LDLCALC 68  TRIG 152*  CHOLHDL 2.5   TSH: Recent Labs    09/15/20 1429  TSH 2.076   A1C: Lab Results  Component Value Date   HGBA1C 5.9 (H) 06/28/2021     Assessment/Plan 1. Type 2 diabetes mellitus with stage 3b chronic kidney disease, with long-term current use of insulin (HCC) No hypoglycemic episodes since levemir was reduced to 7 units. Will continue this regimen and have her continue to check blood sugars twice daily and as needed if she is having any hypoglycemic symptoms.  - Hemoglobin A1c; Future - BASIC METABOLIC PANEL WITH GFR; Future  2. Class 1 obesity due to excess calories with serious comorbidity and body mass index  (BMI) of 31.0 to 31.9 in adult -education provided on healthy weight loss and through proper nutrition- making sure she is eating enough protein and complex carbohydrates to maintain blood sugar spikes and avoid hypoglycemia.    Next appt: 3 months, labs prior to visit.  Carlos American. [place], [facility] [phone]

## 2021-07-15 ENCOUNTER — Encounter: Payer: Self-pay | Admitting: *Deleted

## 2021-07-15 DIAGNOSIS — H524 Presbyopia: Secondary | ICD-10-CM | POA: Diagnosis not present

## 2021-07-15 DIAGNOSIS — Z961 Presence of intraocular lens: Secondary | ICD-10-CM | POA: Diagnosis not present

## 2021-07-15 DIAGNOSIS — H52203 Unspecified astigmatism, bilateral: Secondary | ICD-10-CM | POA: Diagnosis not present

## 2021-07-15 DIAGNOSIS — Z794 Long term (current) use of insulin: Secondary | ICD-10-CM | POA: Diagnosis not present

## 2021-07-15 DIAGNOSIS — E119 Type 2 diabetes mellitus without complications: Secondary | ICD-10-CM | POA: Diagnosis not present

## 2021-07-15 LAB — HM DIABETES EYE EXAM

## 2021-07-17 ENCOUNTER — Other Ambulatory Visit: Payer: Self-pay | Admitting: Adult Health

## 2021-08-02 DIAGNOSIS — L570 Actinic keratosis: Secondary | ICD-10-CM | POA: Diagnosis not present

## 2021-08-02 DIAGNOSIS — Z85828 Personal history of other malignant neoplasm of skin: Secondary | ICD-10-CM | POA: Diagnosis not present

## 2021-08-02 DIAGNOSIS — C44719 Basal cell carcinoma of skin of left lower limb, including hip: Secondary | ICD-10-CM | POA: Diagnosis not present

## 2021-08-02 DIAGNOSIS — D1801 Hemangioma of skin and subcutaneous tissue: Secondary | ICD-10-CM | POA: Diagnosis not present

## 2021-08-02 DIAGNOSIS — L720 Epidermal cyst: Secondary | ICD-10-CM | POA: Diagnosis not present

## 2021-08-02 DIAGNOSIS — L821 Other seborrheic keratosis: Secondary | ICD-10-CM | POA: Diagnosis not present

## 2021-08-02 DIAGNOSIS — D485 Neoplasm of uncertain behavior of skin: Secondary | ICD-10-CM | POA: Diagnosis not present

## 2021-09-06 ENCOUNTER — Other Ambulatory Visit: Payer: Self-pay | Admitting: Family

## 2021-09-06 DIAGNOSIS — R413 Other amnesia: Secondary | ICD-10-CM

## 2021-09-08 ENCOUNTER — Other Ambulatory Visit: Payer: Self-pay

## 2021-09-08 ENCOUNTER — Ambulatory Visit
Admission: RE | Admit: 2021-09-08 | Discharge: 2021-09-08 | Disposition: A | Discharge status: Home / Self Care | Payer: Medicare PPO | Source: Ambulatory Visit | Attending: Nurse Practitioner | Admitting: Nurse Practitioner

## 2021-09-08 DIAGNOSIS — M8588 Other specified disorders of bone density and structure, other site: Secondary | ICD-10-CM | POA: Diagnosis not present

## 2021-09-08 DIAGNOSIS — E2839 Other primary ovarian failure: Secondary | ICD-10-CM

## 2021-09-08 DIAGNOSIS — Z780 Asymptomatic menopausal state: Secondary | ICD-10-CM | POA: Diagnosis not present

## 2021-09-08 DIAGNOSIS — M810 Age-related osteoporosis without current pathological fracture: Secondary | ICD-10-CM | POA: Diagnosis not present

## 2021-09-10 ENCOUNTER — Ambulatory Visit: Payer: Medicare PPO | Admitting: Nurse Practitioner

## 2021-09-10 ENCOUNTER — Other Ambulatory Visit: Payer: Self-pay

## 2021-09-10 ENCOUNTER — Encounter: Payer: Self-pay | Admitting: Nurse Practitioner

## 2021-09-10 VITALS — BP 134/80 | HR 80 | Temp 97.6°F | Ht 68.0 in | Wt 204.0 lb

## 2021-09-10 DIAGNOSIS — Z86711 Personal history of pulmonary embolism: Secondary | ICD-10-CM | POA: Diagnosis not present

## 2021-09-10 DIAGNOSIS — I5032 Chronic diastolic (congestive) heart failure: Secondary | ICD-10-CM

## 2021-09-10 DIAGNOSIS — Z66 Do not resuscitate: Secondary | ICD-10-CM

## 2021-09-10 DIAGNOSIS — N184 Chronic kidney disease, stage 4 (severe): Secondary | ICD-10-CM | POA: Diagnosis not present

## 2021-09-10 DIAGNOSIS — E1169 Type 2 diabetes mellitus with other specified complication: Secondary | ICD-10-CM

## 2021-09-10 DIAGNOSIS — Z6831 Body mass index (BMI) 31.0-31.9, adult: Secondary | ICD-10-CM

## 2021-09-10 DIAGNOSIS — E6609 Other obesity due to excess calories: Secondary | ICD-10-CM

## 2021-09-10 DIAGNOSIS — Z794 Long term (current) use of insulin: Secondary | ICD-10-CM

## 2021-09-10 DIAGNOSIS — N1832 Chronic kidney disease, stage 3b: Secondary | ICD-10-CM

## 2021-09-10 DIAGNOSIS — E785 Hyperlipidemia, unspecified: Secondary | ICD-10-CM

## 2021-09-10 DIAGNOSIS — I10 Essential (primary) hypertension: Secondary | ICD-10-CM | POA: Diagnosis not present

## 2021-09-10 DIAGNOSIS — M810 Age-related osteoporosis without current pathological fracture: Secondary | ICD-10-CM

## 2021-09-10 DIAGNOSIS — E1122 Type 2 diabetes mellitus with diabetic chronic kidney disease: Secondary | ICD-10-CM

## 2021-09-10 DIAGNOSIS — E034 Atrophy of thyroid (acquired): Secondary | ICD-10-CM

## 2021-09-10 MED ORDER — DENOSUMAB 60 MG/ML ~~LOC~~ SOSY
60.0000 mg | PREFILLED_SYRINGE | Freq: Once | SUBCUTANEOUS | Status: AC
  Administered 2021-09-10: 60 mg via SUBCUTANEOUS

## 2021-09-10 NOTE — Progress Notes (Signed)
Careteam: Patient Care Team: Lauree Chandler, NP as PCP - General (Nurse Practitioner) Larey Dresser, MD as PCP - Advanced Heart Failure (Cardiology) Tanda Rockers, MD as Consulting Physician (Pulmonary Disease) Larey Dresser, MD as Consulting Physician (Cardiology)  PLACE OF SERVICE:  [place] Directive information Does Patient Have a Medical Advance Directive?: Yes, Type of Advance Directive: Out of facility DNR (pink MOST or yellow form), Pre-existing out of facility DNR order (yellow form or pink MOST form): Yellow form placed in chart (order not valid for inpatient use), Does patient want to make changes to medical advance directive?: No - Patient declined  Allergies  Allergen Reactions   Lisinopril Shortness Of Breath    Tracheal Edema   Penicillins Other (See Comments)    DID THE REACTION INVOLVE: Swelling of the face/tongue/throat, SOB, or low BP? Yes Sudden or severe rash/hives, skin peeling, or the inside of the mouth or nose? No Did it require medical treatment? No When did it last happen?      more than 10 years  If all above answers are "NO", may proceed with cephalosporin use.   Throat felt tight    Chief Complaint  Patient presents with   Medical Management of Chronic Issues    3 month follow-up and prolia injection. Discuss need for pneumonia, covid vaccines, and MALB. Foot Exam today     HPI: Patient is a 88 y.o. female for routine follow up.   Osteoporosis- got prolia injection today. Recent bone density, reviewed with pt today   DM- no hypoglycemia, blood glucose ranging from 96-127 on review. Not due for a1c for another 3 weeks. She is currently taking 7 units of levemir and tradjenta   Hypothyroid -due for follow up TSH, continues on synthroid  Htn- well controlled. Continues on amlodipine and lasix   Hx of PE- continues on eliquis 5 mg BID, no shortness of breath or chest pains.   Having a harder time getting up to do  this, decrease in stamina   Review of Systems:  Review of Systems  Constitutional:  Negative for chills, fever and weight loss.  HENT:  Negative for tinnitus.   Respiratory:  Negative for cough, sputum production and shortness of breath.   Cardiovascular:  Negative for chest pain, palpitations and leg swelling.  Gastrointestinal:  Negative for abdominal pain, constipation, diarrhea and heartburn.  Genitourinary:  Negative for dysuria, frequency and urgency.  Musculoskeletal:  Negative for back pain, falls, joint pain and myalgias.  Skin: Negative.   Neurological:  Negative for dizziness and headaches.  Psychiatric/Behavioral:  Negative for depression and memory loss. The patient does not have insomnia.    Past Medical History:  Diagnosis Date   Arthritis    bilateral knees   Chronic diastolic CHF (congestive heart failure) (HCC)    CKD (chronic kidney disease), stage III (HCC)    Diabetes mellitus without complication ([place])    Does use hearing aid    Edema    Hyperlipidemia    Hypertension    Hypothyroidism    Pulmonary embolism ([place])    Urinary incontinence    Past Surgical History:  Procedure Laterality Date   BLADDER SURGERY  2000   Dr.Tannerbaum (Bladder Tact)   CATARACT EXTRACTION  03/2013   Dr.Shapiro   CHOLECYSTECTOMY     EYE SURGERY     bilateral cataracts   [facility]    right elbow  1999   x 2, still has one piece of metal in it   [place]   Dr.Brewer   Social History:   reports that she has never smoked. She has never used smokeless tobacco. She reports that she does not drink alcohol and does not use drugs.  Family History  Problem Relation Age of Onset   Suicidality Father    Cancer Brother    Cancer Maternal Grandfather    Diabetes Maternal Grandmother    Melanoma Brother     Medications: Patient's Medications  New Prescriptions   No medications on file  Previous Medications    ACCU-CHEK GUIDE TEST STRIP    USE DAILY TO TEST BLOOD SUGAR (DX:E11.22)   AMLODIPINE (NORVASC) 5 MG TABLET    TAKE 1 TABLET BY MOUTH EVERY DAY   CHOLECALCIFEROL (VITAMIN D3) 5000 UNITS CHEW    Chew 5,000 Units by mouth daily.    DONEPEZIL (ARICEPT) 10 MG TABLET    TAKE 1 TABLET BY MOUTH EVERY DAY AT BEDTIME TO PRESERVE MEMORY   ELIQUIS 5 MG TABS TABLET    TAKE 1 TABLET BY MOUTH TWICE A DAY   FUROSEMIDE (LASIX) 20 MG TABLET    TAKE 1 TABLET BY MOUTH EVERY DAY   INSULIN DETEMIR (LEVEMIR FLEXTOUCH) 100 UNIT/ML FLEXPEN    Inject 7 Units into the skin at bedtime.   INSULIN PEN NEEDLE (BD PEN NEEDLE NANO 2ND GEN) 32G X 4 MM MISC    1 Device by Other route daily. DX IMO0002   LEVOTHYROXINE (SYNTHROID) 88 MCG TABLET    TAKE 1 TABLET BY MOUTH EVERY DAY BEFORE BREAKFAST   LINAGLIPTIN (TRADJENTA) 5 MG TABS TABLET    TAKE 1 TABLET BY MOUTH EVERY DAY   MULTIPLE VITAMIN (MULTIVITAMIN WITH MINERALS) TABS TABLET    Take 1 tablet by mouth daily.   NUTRITIONAL SUPPLEMENTS (GLUCERNA SHAKE PO)    Take by mouth.   ONETOUCH DELICA LANCETS 19F MISC    USE EVERY OTHER DAY TO TEST BLOOD SUGAR  Modified Medications   No medications on file  Discontinued Medications   No medications on file    Physical Exam:  Vitals:   09/10/21 1026  BP: 134/80  Pulse: 80  Temp: 97.6 F (36.4 C)  TempSrc: Temporal  SpO2: 97%  Weight: 204 lb (92.5 kg)  Height: $Remove'5\' 8"'OtrhUYV$  (1.727 m)   Body mass index is 31.02 kg/m. Wt Readings from Last 3 Encounters:  09/10/21 204 lb (92.5 kg)  07/12/21 207 lb 12.8 oz (94.3 kg)  06/28/21 206 lb (93.4 kg)    Physical Exam Constitutional:      General: She is not in acute distress.    Appearance: She is well-developed. She is not diaphoretic.  HENT:     Head: Normocephalic and atraumatic.     Mouth/Throat:     Pharynx: No oropharyngeal exudate.  Eyes:     Conjunctiva/sclera: Conjunctivae normal.     Pupils: Pupils are equal, round, and reactive to light.  Cardiovascular:     Rate and  Rhythm: Normal rate and regular rhythm.     Heart sounds: Normal heart sounds.  Pulmonary:     Effort: Pulmonary effort is normal.     Breath sounds: Normal breath sounds.  Abdominal:     General: Bowel sounds are normal.     Palpations: Abdomen is soft.  Musculoskeletal:     Cervical back: Normal range of motion and neck supple.     Right  lower leg: No edema.     Left lower leg: No edema.  Skin:    General: Skin is warm and dry.  Neurological:     Mental Status: She is alert.  Psychiatric:        Mood and Affect: Mood normal.    Labs reviewed: Basic Metabolic Panel: Recent Labs    09/15/20 1429 02/26/21 1105 04/23/21 0951 05/27/21 0933 06/28/21 1604  NA 141   < > 142 144 142  K 4.4   < > 4.1 4.0 4.1  CL 103   < > 106 107 102  CO2 27   < > $R'29 28 29  'DK$ GLUCOSE 112*   < > 73 59* 103  BUN 26*   < > 23 25 33*  CREATININE 1.51*   < > 1.28* 1.28* 1.54*  CALCIUM 10.8*   < > 10.3 10.2 10.4  TSH 2.076  --   --   --   --    < > = values in this interval not displayed.   Liver Function Tests: Recent Labs    04/23/21 0951 05/27/21 0933 06/28/21 1604  AST 55* 19 39*  ALT 54* 12 24  BILITOT 0.8 0.6 0.6  PROT 6.0* 6.1 6.3   No results for input(s): LIPASE, AMYLASE in the last 8760 hours. No results for input(s): AMMONIA in the last 8760 hours. CBC: Recent Labs    09/15/20 1429 02/26/21 1105 06/28/21 1604  WBC 7.5 7.4 9.3  NEUTROABS  --  4,677 5,552  HGB 14.3 14.0 14.9  HCT 44.0 43.6 46.2*  MCV 90.7 90.3 90.9  PLT 195 172 230   Lipid Panel: Recent Labs    02/26/21 1105  CHOL 150  HDL 59  LDLCALC 68  TRIG 152*  CHOLHDL 2.5   TSH: Recent Labs    09/15/20 1429  TSH 2.076   A1C: Lab Results  Component Value Date   HGBA1C 5.9 (H) 06/28/2021     Assessment/Plan 1. Osteoporosis, unspecified osteoporosis type, unspecified pathological fracture presence -bone density reviewed with her today. Recommended to take calcium 600 mg twice daily with Vitamin D  2000 units daily and weight bearing activity 30 mins/5 days a week - denosumab (PROLIA) injection 60 mg - CMP with eGFR([facility]); Future  2. Type 2 diabetes mellitus with stage 3b chronic kidney disease, with long-term current use of insulin ([place]) -continues with dietary modifications and medications. No hypoglycemia noted -Encouraged dietary compliance, routine foot care/monitoring and to keep up with diabetic eye exams through ophthalmology  - Microalbumin / creatinine urine ratio; Future - Hemoglobin A1c; Future  3. Class 1 obesity due to excess calories with serious comorbidity and body mass index (BMI) of 31.0 to 31.9 in adult --education provided on healthy weight loss through increase in physical activity and proper nutrition. She is limited on physical activity due to debility.  4. CKD (chronic kidney disease) stage 4, GFR 15-29 ml/min (HCC) Chronic and stable Encourage proper hydration Follow metabolic panel Avoid nephrotoxic meds (NSAIDS)  5. Hyperlipidemia associated with type 2 diabetes mellitus ([place]) -she has been off statin due to elevated liver enzymes in the past, will follow up lipid panel and restart if needed and monitor LFTs -previously on crestor 40 mg daily  - CMP with eGFR([facility]); Future  6. Hypothyroidism due to acquired atrophy of thyroid -continues on synthroid  - TSH; Future  7. Primary hypertension -Blood pressure well controlled Continue current medications Recheck metabolic panel - CMP with eGFR([facility]); Future -  CBC with Differential/Platelet; Future  8. Hx of pulmonary embolus No signs of recurrence, tolerating eliquis - CBC with Differential/Platelet; Future  9. DNR no code (do not resuscitate) - Do not attempt resuscitation (DNR)  10. Chronic diastolic CHF (congestive heart failure) (HCC) Chronic and stable at this time, continue medication.    Next appt: 1 month for lab work 4 months for follow up [employer]. [place], [facility] [phone]

## 2021-09-10 NOTE — Patient Instructions (Signed)
To follow up in 1 month for lab work  Follow up in office to see Janett Billow in 4 months.

## 2021-09-25 ENCOUNTER — Other Ambulatory Visit: Payer: Self-pay | Admitting: Nurse Practitioner

## 2021-09-25 DIAGNOSIS — I509 Heart failure, unspecified: Secondary | ICD-10-CM

## 2021-10-14 ENCOUNTER — Other Ambulatory Visit: Payer: Self-pay | Admitting: Nurse Practitioner

## 2021-10-15 ENCOUNTER — Other Ambulatory Visit: Payer: Medicare PPO

## 2021-10-15 ENCOUNTER — Other Ambulatory Visit: Payer: Self-pay

## 2021-10-15 DIAGNOSIS — I10 Essential (primary) hypertension: Secondary | ICD-10-CM

## 2021-10-15 DIAGNOSIS — M810 Age-related osteoporosis without current pathological fracture: Secondary | ICD-10-CM | POA: Diagnosis not present

## 2021-10-15 DIAGNOSIS — Z86711 Personal history of pulmonary embolism: Secondary | ICD-10-CM

## 2021-10-15 DIAGNOSIS — E1169 Type 2 diabetes mellitus with other specified complication: Secondary | ICD-10-CM | POA: Diagnosis not present

## 2021-10-15 DIAGNOSIS — Z794 Long term (current) use of insulin: Secondary | ICD-10-CM

## 2021-10-15 DIAGNOSIS — E034 Atrophy of thyroid (acquired): Secondary | ICD-10-CM | POA: Diagnosis not present

## 2021-10-15 DIAGNOSIS — E785 Hyperlipidemia, unspecified: Secondary | ICD-10-CM | POA: Diagnosis not present

## 2021-10-15 DIAGNOSIS — E1122 Type 2 diabetes mellitus with diabetic chronic kidney disease: Secondary | ICD-10-CM | POA: Diagnosis not present

## 2021-10-15 DIAGNOSIS — N1832 Chronic kidney disease, stage 3b: Secondary | ICD-10-CM

## 2021-10-16 LAB — CBC WITH DIFFERENTIAL/PLATELET
Absolute Monocytes: 583 cells/uL (ref 200–950)
Basophils Absolute: 72 cells/uL (ref 0–200)
Basophils Relative: 1 %
Eosinophils Absolute: 187 cells/uL (ref 15–500)
Eosinophils Relative: 2.6 %
HCT: 45.9 % — ABNORMAL HIGH (ref 35.0–45.0)
Hemoglobin: 14.9 g/dL (ref 11.7–15.5)
Lymphs Abs: 2506 cells/uL (ref 850–3900)
MCH: 29.3 pg (ref 27.0–33.0)
MCHC: 32.5 g/dL (ref 32.0–36.0)
MCV: 90.4 fL (ref 80.0–100.0)
MPV: 12.2 fL (ref 7.5–12.5)
Monocytes Relative: 8.1 %
Neutro Abs: 3852 cells/uL (ref 1500–7800)
Neutrophils Relative %: 53.5 %
Platelets: 211 10*3/uL (ref 140–400)
RBC: 5.08 10*6/uL (ref 3.80–5.10)
RDW: 12.7 % (ref 11.0–15.0)
Total Lymphocyte: 34.8 %
WBC: 7.2 10*3/uL (ref 3.8–10.8)

## 2021-10-16 LAB — COMPLETE METABOLIC PANEL WITH GFR
AG Ratio: 1.6 (calc) (ref 1.0–2.5)
ALT: 10 U/L (ref 6–29)
AST: 14 U/L (ref 10–35)
Albumin: 3.9 g/dL (ref 3.6–5.1)
Alkaline phosphatase (APISO): 46 U/L (ref 37–153)
BUN/Creatinine Ratio: 18 (calc) (ref 6–22)
BUN: 25 mg/dL (ref 7–25)
CO2: 26 mmol/L (ref 20–32)
Calcium: 10.6 mg/dL — ABNORMAL HIGH (ref 8.6–10.4)
Chloride: 106 mmol/L (ref 98–110)
Creat: 1.38 mg/dL — ABNORMAL HIGH (ref 0.60–0.95)
Globulin: 2.4 g/dL (calc) (ref 1.9–3.7)
Glucose, Bld: 139 mg/dL — ABNORMAL HIGH (ref 65–99)
Potassium: 4.4 mmol/L (ref 3.5–5.3)
Sodium: 143 mmol/L (ref 135–146)
Total Bilirubin: 0.6 mg/dL (ref 0.2–1.2)
Total Protein: 6.3 g/dL (ref 6.1–8.1)
eGFR: 36 mL/min/{1.73_m2} — ABNORMAL LOW (ref >=60)

## 2021-10-16 LAB — LIPID PANEL
Cholesterol: 289 mg/dL — ABNORMAL HIGH (ref <200)
HDL: 74 mg/dL (ref >=50)
LDL Cholesterol (Calc): 180 mg/dL (calc) — ABNORMAL HIGH
Non-HDL Cholesterol (Calc): 215 mg/dL (calc) — ABNORMAL HIGH (ref <130)
Total CHOL/HDL Ratio: 3.9 (calc) (ref <5.0)
Triglycerides: 190 mg/dL — ABNORMAL HIGH (ref <150)

## 2021-10-16 LAB — HEMOGLOBIN A1C
Hgb A1c MFr Bld: 6.5 % of total Hgb — ABNORMAL HIGH (ref <5.7)
Mean Plasma Glucose: 140 mg/dL
eAG (mmol/L): 7.7 mmol/L

## 2021-10-16 LAB — TSH: TSH: 1.86 mIU/L (ref 0.40–4.50)

## 2021-10-18 ENCOUNTER — Other Ambulatory Visit: Payer: Self-pay

## 2021-10-18 MED ORDER — ROSUVASTATIN CALCIUM 10 MG PO TABS: 10.0000 mg | ORAL_TABLET | Freq: Every day | ORAL | 3 refills | Status: DC

## 2021-10-20 DIAGNOSIS — Z794 Long term (current) use of insulin: Secondary | ICD-10-CM | POA: Diagnosis not present

## 2021-10-20 DIAGNOSIS — E785 Hyperlipidemia, unspecified: Secondary | ICD-10-CM | POA: Diagnosis not present

## 2021-10-20 DIAGNOSIS — N1832 Chronic kidney disease, stage 3b: Secondary | ICD-10-CM | POA: Diagnosis not present

## 2021-10-20 DIAGNOSIS — E034 Atrophy of thyroid (acquired): Secondary | ICD-10-CM | POA: Diagnosis not present

## 2021-10-20 DIAGNOSIS — Z86711 Personal history of pulmonary embolism: Secondary | ICD-10-CM | POA: Diagnosis not present

## 2021-10-20 DIAGNOSIS — E1122 Type 2 diabetes mellitus with diabetic chronic kidney disease: Secondary | ICD-10-CM | POA: Diagnosis not present

## 2021-10-20 DIAGNOSIS — E1169 Type 2 diabetes mellitus with other specified complication: Secondary | ICD-10-CM | POA: Diagnosis not present

## 2021-10-20 DIAGNOSIS — M810 Age-related osteoporosis without current pathological fracture: Secondary | ICD-10-CM | POA: Diagnosis not present

## 2021-10-20 DIAGNOSIS — I10 Essential (primary) hypertension: Secondary | ICD-10-CM | POA: Diagnosis not present

## 2021-10-21 LAB — MICROALBUMIN / CREATININE URINE RATIO
Creatinine, Urine: 183 mg/dL (ref 20–275)
Microalb Creat Ratio: 8 mcg/mg creat (ref <30)
Microalb, Ur: 1.4 mg/dL

## 2021-11-20 ENCOUNTER — Other Ambulatory Visit: Payer: Self-pay | Admitting: Nurse Practitioner

## 2021-11-20 DIAGNOSIS — E1122 Type 2 diabetes mellitus with diabetic chronic kidney disease: Secondary | ICD-10-CM

## 2021-11-22 NOTE — Telephone Encounter (Signed)
Please indicate instructions for patient medication needing to be refilled "Levemir".  ?

## 2021-12-26 ENCOUNTER — Other Ambulatory Visit: Payer: Self-pay | Admitting: Nurse Practitioner

## 2022-01-17 ENCOUNTER — Ambulatory Visit: Payer: Medicare PPO | Admitting: Nurse Practitioner

## 2022-01-17 ENCOUNTER — Encounter: Payer: Self-pay | Admitting: Nurse Practitioner

## 2022-01-17 VITALS — BP 128/78 | HR 86 | Temp 97.1°F | Ht 68.0 in | Wt 195.0 lb

## 2022-01-17 DIAGNOSIS — Z794 Long term (current) use of insulin: Secondary | ICD-10-CM

## 2022-01-17 DIAGNOSIS — E1122 Type 2 diabetes mellitus with diabetic chronic kidney disease: Secondary | ICD-10-CM | POA: Diagnosis not present

## 2022-01-17 DIAGNOSIS — E034 Atrophy of thyroid (acquired): Secondary | ICD-10-CM

## 2022-01-17 DIAGNOSIS — E1169 Type 2 diabetes mellitus with other specified complication: Secondary | ICD-10-CM | POA: Diagnosis not present

## 2022-01-17 DIAGNOSIS — R42 Dizziness and giddiness: Secondary | ICD-10-CM

## 2022-01-17 DIAGNOSIS — E6609 Other obesity due to excess calories: Secondary | ICD-10-CM | POA: Diagnosis not present

## 2022-01-17 DIAGNOSIS — I10 Essential (primary) hypertension: Secondary | ICD-10-CM | POA: Diagnosis not present

## 2022-01-17 DIAGNOSIS — N1832 Chronic kidney disease, stage 3b: Secondary | ICD-10-CM | POA: Diagnosis not present

## 2022-01-17 DIAGNOSIS — E785 Hyperlipidemia, unspecified: Secondary | ICD-10-CM

## 2022-01-17 DIAGNOSIS — H9113 Presbycusis, bilateral: Secondary | ICD-10-CM

## 2022-01-17 DIAGNOSIS — Z6831 Body mass index (BMI) 31.0-31.9, adult: Secondary | ICD-10-CM

## 2022-01-17 LAB — GLUCOSE, POCT (MANUAL RESULT ENTRY): POC Glucose: 203 mg/dl — AB (ref 70–99)

## 2022-01-17 NOTE — Progress Notes (Signed)
Careteam: Patient Care Team: Sharon Seller, NP as PCP - General (Nurse Practitioner) Laurey Morale, MD as PCP - Advanced Heart Failure (Cardiology) Nyoka Cowden, MD as Consulting Physician (Pulmonary Disease) Laurey Morale, MD as Consulting Physician (Cardiology)  PLACE OF SERVICE:  [facility]  Advanced Directive information    Allergies  Allergen Reactions   Lisinopril Shortness Of Breath    Tracheal Edema   Penicillins Other (See Comments)    DID THE REACTION INVOLVE: Swelling of the face/tongue/throat, SOB, or low BP? Yes Sudden or severe rash/hives, skin peeling, or the inside of the mouth or nose? No Did it require medical treatment? No When did it last happen?      more than 10 years  If all above answers are "NO", may proceed with cephalosporin use.   Throat felt tight    Chief Complaint  Patient presents with   Medical Management of Chronic Issues    4 month follow-up discuss need for PCV and covid booster or post pone if patient refuses. NCIR verified. Patient denies receiving any vaccines since last visit. FYI- patient felt good when she woke up this morning and now feels down, not sure what happened to change her mood. Patient states her head feels woozy, and questions if she had a stroke or something.      HPI: Patient is a 88 y.o. female for routine follow up.   She reports she generally gets up around 7-730 and has a glucerna and orange juice for breakfast.  Felt great this morning. She has been having some constipation this morning. Felt like she needed to have a BM but it took awhile to have one. Stomach was also bothering her. She felt woozy when she was on the commode and this has continued.  Blood sugar 145 this morning at 7 am No hypoglycemic episodes No chest pains or shortness of breath.  Reports she does have her hearing aides in and that always makes her feel off.  No abnormal bruising or bleeding.   Reports she can not walk any  amount of time without being short of breath this has been ongoing, very inactive.   Review of Systems:  Review of Systems  Constitutional:  Negative for chills, fever and weight loss.  HENT:  Negative for tinnitus.   Respiratory:  Negative for cough, sputum production and shortness of breath.   Cardiovascular:  Negative for chest pain, palpitations and leg swelling.  Gastrointestinal:  Negative for abdominal pain, constipation, diarrhea and heartburn.  Genitourinary:  Negative for dysuria, frequency and urgency.  Musculoskeletal:  Negative for back pain, falls, joint pain and myalgias.  Skin: Negative.   Neurological:  Positive for dizziness. Negative for headaches.  Psychiatric/Behavioral:  Negative for depression and memory loss. The patient does not have insomnia.    Past Medical History:  Diagnosis Date   Arthritis    bilateral knees   Chronic diastolic CHF (congestive heart failure) (HCC)    CKD (chronic kidney disease), stage III (HCC)    Diabetes mellitus without complication (HCC)    Does use hearing aid    Edema    Hyperlipidemia    Hypertension    Hypothyroidism    Pulmonary embolism (HCC)    Urinary incontinence    Past Surgical History:  Procedure Laterality Date   BLADDER SURGERY  2000   Dr.Tannerbaum (Bladder Tact)   CATARACT EXTRACTION  03/2013   Dr.Shapiro   CHOLECYSTECTOMY     EYE  SURGERY     bilateral cataracts   [facility]    right elbow  1999   x 2, still has one piece of metal in it   [place]   Dr.Brewer   Social History:   reports that she has never smoked. She has never used smokeless tobacco. She reports that she does not drink alcohol and does not use drugs.  Family History  Problem Relation Age of Onset   Suicidality Father    Cancer Brother    Cancer Maternal Grandfather    Diabetes Maternal Grandmother    Melanoma Brother     Medications: Patient's Medications   New Prescriptions   No medications on file  Previous Medications   ACCU-CHEK GUIDE TEST STRIP    USE DAILY TO TEST BLOOD SUGAR (DX:E11.22)   AMLODIPINE (NORVASC) 5 MG TABLET    TAKE 1 TABLET BY MOUTH EVERY DAY   APIXABAN (ELIQUIS) 5 MG TABS TABLET    TAKE 1 TABLET BY MOUTH TWICE A DAY   CHOLECALCIFEROL (VITAMIN D3) 5000 UNITS CHEW    Chew 5,000 Units by mouth daily.    DONEPEZIL (ARICEPT) 10 MG TABLET    TAKE 1 TABLET BY MOUTH EVERY DAY AT BEDTIME TO PRESERVE MEMORY   FUROSEMIDE (LASIX) 20 MG TABLET    TAKE 1 TABLET BY MOUTH EVERY DAY   INSULIN DETEMIR (LEVEMIR FLEXTOUCH) 100 UNIT/ML FLEXPEN    Inject 7 Units into the skin daily.   INSULIN PEN NEEDLE (BD PEN NEEDLE NANO 2ND GEN) 32G X 4 MM MISC    1 Device by Other route daily. DX [id number]   LEVOTHYROXINE (SYNTHROID) 88 MCG TABLET    TAKE 1 TABLET BY MOUTH EVERY DAY BEFORE BREAKFAST   LINAGLIPTIN (TRADJENTA) 5 MG TABS TABLET    TAKE 1 TABLET BY MOUTH EVERY DAY   MULTIPLE VITAMIN (MULTIVITAMIN WITH MINERALS) TABS TABLET    Take 1 tablet by mouth daily.   NUTRITIONAL SUPPLEMENTS (GLUCERNA SHAKE PO)    Take by mouth.   ONETOUCH DELICA LANCETS 31R MISC    USE EVERY OTHER DAY TO TEST BLOOD SUGAR   ROSUVASTATIN (CRESTOR) 10 MG TABLET    Take 1 tablet (10 mg total) by mouth daily.  Modified Medications   No medications on file  Discontinued Medications   No medications on file    Physical Exam:  Vitals:   01/17/22 0922  BP: 128/78  Pulse: 86  Temp: (!) 97.1 F (36.2 C)  TempSrc: Temporal  SpO2: 96%  Weight: 195 lb (88.5 kg)  Height: $Remove'5\' 8"'vXwrEWC$  (1.727 m)   Body mass index is 29.65 kg/m. Wt Readings from Last 3 Encounters:  01/17/22 195 lb (88.5 kg)  09/10/21 204 lb (92.5 kg)  07/12/21 207 lb 12.8 oz (94.3 kg)    Physical Exam Constitutional:      General: She is not in acute distress.    Appearance: She is well-developed. She is not diaphoretic.  HENT:     Head: Normocephalic and atraumatic.     Mouth/Throat:     Pharynx: No  oropharyngeal exudate.  Eyes:     Conjunctiva/sclera: Conjunctivae normal.     Pupils: Pupils are equal, round, and reactive to light.  Cardiovascular:     Rate and Rhythm: Normal rate and regular rhythm.     Heart sounds: Normal heart sounds.  Pulmonary:     Effort: Pulmonary effort is normal.  Breath sounds: Normal breath sounds.  Abdominal:     General: Bowel sounds are normal.     Palpations: Abdomen is soft.  Musculoskeletal:     Cervical back: Normal range of motion and neck supple.     Right lower leg: No edema.     Left lower leg: No edema.  Skin:    General: Skin is warm and dry.  Neurological:     Mental Status: She is alert and oriented to person, place, and time.     Motor: No weakness.     Gait: Gait abnormal (gait at baseline, uses walker).  Psychiatric:        Mood and Affect: Mood normal.    Labs reviewed: Basic Metabolic Panel: Recent Labs    05/27/21 0933 06/28/21 1604 10/15/21 0932  NA 144 142 143  K 4.0 4.1 4.4  CL 107 102 106  CO2 $Re'28 29 26  'LgN$ GLUCOSE 59* 103 139*  BUN 25 33* 25  CREATININE 1.28* 1.54* 1.38*  CALCIUM 10.2 10.4 10.6*  TSH  --   --  1.86   Liver Function Tests: Recent Labs    05/27/21 0933 06/28/21 1604 10/15/21 0932  AST 19 39* 14  ALT $Re'12 24 10  'jwM$ BILITOT 0.6 0.6 0.6  PROT 6.1 6.3 6.3   No results for input(s): LIPASE, AMYLASE in the last 8760 hours. No results for input(s): AMMONIA in the last 8760 hours. CBC: Recent Labs    02/26/21 1105 06/28/21 1604 10/15/21 0932  WBC 7.4 9.3 7.2  NEUTROABS 4,677 5,552 3,852  HGB 14.0 14.9 14.9  HCT 43.6 46.2* 45.9*  MCV 90.3 90.9 90.4  PLT 172 230 211   Lipid Panel: Recent Labs    02/26/21 1105 10/15/21 0932  CHOL 150 289*  HDL 59 74  LDLCALC 68 180*  TRIG 152* 190*  CHOLHDL 2.5 3.9   TSH: Recent Labs    10/15/21 0932  TSH 1.86   A1C: Lab Results  Component Value Date   HGBA1C 6.5 (H) 10/15/2021     1. Type 2 diabetes mellitus with stage 3b chronic  kidney disease, with long-term current use of insulin (HCC) -Encouraged dietary compliance, routine foot care/monitoring and to keep up with diabetic eye exams through ophthalmology  -continues levemir 7 units daily - Hemoglobin A1c - POC Glucose (CBG)  2. Class 1 obesity due to excess calories with serious comorbidity and body mass index (BMI) of 31.0 to 31.9 in adult -continue dietary modifications.   3. Hypothyroidism due to acquired atrophy of thyroid -TSH at goal on recent labs.   4. Hyperlipidemia associated with type 2 diabetes mellitus ([place]) -follow up lipids today, continues on crestor. - Lipid panel  5. Primary hypertension -Blood pressure well controlled Continue current medications Recheck metabolic panel - CMP with eGFR([facility]) - CBC with Differential/Platelet  6. Presbycusis of both ears -reports she generally does not wear hearing aides but had them on for appt- reports this sometimes addes to feelings of dizziness.   7. Dizziness -felt better towards end of visit. Encouraged proper nutrition and hydration.  Unsure if BM contributing to feeling of dizziness today. She will monitor and notify PRN.    Return in about 4 months (around 05/19/2022) for routine follow up . Carlos American. [place], [facility] [phone]

## 2022-01-17 NOTE — Patient Instructions (Signed)
Make sure you are staying well hydrated.

## 2022-01-18 LAB — COMPLETE METABOLIC PANEL WITH GFR
AG Ratio: 1.7 (calc) (ref 1.0–2.5)
ALT: 24 U/L (ref 6–29)
AST: 22 U/L (ref 10–35)
Albumin: 3.7 g/dL (ref 3.6–5.1)
Alkaline phosphatase (APISO): 43 U/L (ref 37–153)
BUN/Creatinine Ratio: 21 (calc) (ref 6–22)
BUN: 34 mg/dL — ABNORMAL HIGH (ref 7–25)
CO2: 32 mmol/L (ref 20–32)
Calcium: 10.8 mg/dL — ABNORMAL HIGH (ref 8.6–10.4)
Chloride: 100 mmol/L (ref 98–110)
Creat: 1.62 mg/dL — ABNORMAL HIGH (ref 0.60–0.95)
Globulin: 2.2 g/dL (calc) (ref 1.9–3.7)
Glucose, Bld: 200 mg/dL — ABNORMAL HIGH (ref 65–139)
Potassium: 3.9 mmol/L (ref 3.5–5.3)
Sodium: 141 mmol/L (ref 135–146)
Total Bilirubin: 0.8 mg/dL (ref 0.2–1.2)
Total Protein: 5.9 g/dL — ABNORMAL LOW (ref 6.1–8.1)
eGFR: 30 mL/min/{1.73_m2} — ABNORMAL LOW (ref >=60)

## 2022-01-18 LAB — CBC WITH DIFFERENTIAL/PLATELET
Absolute Monocytes: 684 cells/uL (ref 200–950)
Basophils Absolute: 53 cells/uL (ref 0–200)
Basophils Relative: 0.7 %
Eosinophils Absolute: 129 cells/uL (ref 15–500)
Eosinophils Relative: 1.7 %
HCT: 43.6 % (ref 35.0–45.0)
Hemoglobin: 14 g/dL (ref 11.7–15.5)
Lymphs Abs: 1710 cells/uL (ref 850–3900)
MCH: 29 pg (ref 27.0–33.0)
MCHC: 32.1 g/dL (ref 32.0–36.0)
MCV: 90.3 fL (ref 80.0–100.0)
MPV: 12.1 fL (ref 7.5–12.5)
Monocytes Relative: 9 %
Neutro Abs: 5024 cells/uL (ref 1500–7800)
Neutrophils Relative %: 66.1 %
Platelets: 177 10*3/uL (ref 140–400)
RBC: 4.83 10*6/uL (ref 3.80–5.10)
RDW: 12.6 % (ref 11.0–15.0)
Total Lymphocyte: 22.5 %
WBC: 7.6 10*3/uL (ref 3.8–10.8)

## 2022-01-18 LAB — LIPID PANEL
Cholesterol: 168 mg/dL (ref <200)
HDL: 57 mg/dL (ref >=50)
LDL Cholesterol (Calc): 83 mg/dL (calc)
Non-HDL Cholesterol (Calc): 111 mg/dL (calc) (ref <130)
Total CHOL/HDL Ratio: 2.9 (calc) (ref <5.0)
Triglycerides: 189 mg/dL — ABNORMAL HIGH (ref <150)

## 2022-01-18 LAB — HEMOGLOBIN A1C
Hgb A1c MFr Bld: 7.6 % of total Hgb — ABNORMAL HIGH (ref <5.7)
Mean Plasma Glucose: 171 mg/dL
eAG (mmol/L): 9.5 mmol/L

## 2022-03-11 ENCOUNTER — Telehealth: Payer: Self-pay | Admitting: *Deleted

## 2022-03-11 NOTE — Telephone Encounter (Signed)
PA submitted to [facility] through [employer]. Awaiting Determination.   TEREZ, MONTEE Patient Member ID [id number] Date of Birth [birth year] Gender Female Eligibility Status Active Coverage Group Number [id number] Plan / Coverage Date 2019-08-16 Transaction Type Outpatient Authorization/Referral Organization [place] Certificate Information Reference Number [id number] Status PENDED Review Reason 1 Requires Medical Review Message This case requires further review. You will be contacted if additional information is needed.

## 2022-03-14 ENCOUNTER — Ambulatory Visit: Payer: Medicare PPO

## 2022-03-17 ENCOUNTER — Ambulatory Visit (INDEPENDENT_AMBULATORY_CARE_PROVIDER_SITE_OTHER): Payer: Medicare PPO | Admitting: Nurse Practitioner

## 2022-03-17 ENCOUNTER — Encounter: Payer: Self-pay | Admitting: Nurse Practitioner

## 2022-03-17 ENCOUNTER — Telehealth: Payer: Self-pay

## 2022-03-17 DIAGNOSIS — Z0000 Encounter for general adult medical examination without abnormal findings: Secondary | ICD-10-CM | POA: Diagnosis not present

## 2022-03-17 NOTE — Progress Notes (Signed)
Subjective:   Melissa Shannon is a 88 y.o. female who presents for Medicare Annual (Subsequent) preventive examination.  Review of Systems           Objective:    There were no vitals filed for this visit. There is no height or weight on file to calculate BMI.     03/17/2022   12:52 PM 09/10/2021   10:28 AM 06/24/2021   10:20 AM 03/10/2021    1:39 PM 02/26/2021   10:39 AM 10/30/2020    9:10 AM 07/03/2020    9:20 AM  Advanced Directives  Does Patient Have a Medical Advance Directive? Yes Yes No Yes Yes Yes Yes  Type of Advance Directive Out of facility DNR (pink MOST or yellow form) Out of facility DNR (pink MOST or yellow form)  Out of facility DNR (pink MOST or yellow form) Out of facility DNR (pink MOST or yellow form) Out of facility DNR (pink MOST or yellow form) Out of facility DNR (pink MOST or yellow form);Enders;Living will  Does patient want to make changes to medical advance directive? No - Patient declined No - Patient declined  No - Patient declined No - Patient declined No - Patient declined No - Patient declined  Copy of [place] in Chart?       No - copy requested  Would patient like information on creating a medical advance directive?   No - Patient declined      Pre-existing out of facility DNR order (yellow form or pink MOST form) Yellow form placed in chart (order not valid for inpatient use) Yellow form placed in chart (order not valid for inpatient use)   Yellow form placed in chart (order not valid for inpatient use) Yellow form placed in chart (order not valid for inpatient use)     Current Medications (verified) Outpatient Encounter Medications as of 03/17/2022  Medication Sig   ACCU-CHEK GUIDE test strip USE DAILY TO TEST BLOOD SUGAR (DX:E11.22)   amLODipine (NORVASC) 5 MG tablet TAKE 1 TABLET BY MOUTH EVERY DAY   apixaban (ELIQUIS) 5 MG TABS tablet TAKE 1 TABLET BY MOUTH TWICE A DAY   Cholecalciferol (VITAMIN D3)  5000 UNITS CHEW Chew 5,000 Units by mouth daily.    donepezil (ARICEPT) 10 MG tablet TAKE 1 TABLET BY MOUTH EVERY DAY AT BEDTIME TO PRESERVE MEMORY   furosemide (LASIX) 20 MG tablet TAKE 1 TABLET BY MOUTH EVERY DAY   insulin detemir (LEVEMIR FLEXTOUCH) 100 UNIT/ML FlexPen Inject 7 Units into the skin daily.   Insulin Pen Needle (BD PEN NEEDLE NANO 2ND GEN) 32G X 4 MM MISC 1 Device by Other route daily. DX [id number]   levothyroxine (SYNTHROID) 88 MCG tablet TAKE 1 TABLET BY MOUTH EVERY DAY BEFORE BREAKFAST   linagliptin (TRADJENTA) 5 MG TABS tablet TAKE 1 TABLET BY MOUTH EVERY DAY   Multiple Vitamin (MULTIVITAMIN WITH MINERALS) TABS tablet Take 1 tablet by mouth daily.   Nutritional Supplements (GLUCERNA SHAKE PO) Take by mouth.   OneTouch Delica Lancets 02H MISC USE EVERY OTHER DAY TO TEST BLOOD SUGAR   rosuvastatin (CRESTOR) 10 MG tablet Take 1 tablet (10 mg total) by mouth daily.   No facility-administered encounter medications on file as of 03/17/2022.    Allergies (verified) Lisinopril and Penicillins   History: Past Medical History:  Diagnosis Date   Arthritis    bilateral knees   Chronic diastolic CHF (congestive heart failure) (HCC)    CKD (  chronic kidney disease), stage III (HCC)    Diabetes mellitus without complication ([place])    Does use hearing aid    Edema    Hyperlipidemia    Hypertension    Hypothyroidism    Pulmonary embolism ([place])    Urinary incontinence    Past Surgical History:  Procedure Laterality Date   BLADDER SURGERY  2000   Dr.Tannerbaum (Bladder Tact)   CATARACT EXTRACTION  03/2013   Dr.Shapiro   CHOLECYSTECTOMY     EYE SURGERY     bilateral cataracts   [facility]    right elbow  1999   x 2, still has one piece of metal in it   [place]   Dr.Brewer   Family History  Problem Relation Age of Onset   Suicidality Father    Cancer Brother    Cancer Maternal Grandfather    Diabetes  Maternal Grandmother    Melanoma Brother    Social History   Socioeconomic History   Marital status: Widowed    Spouse name: Not on file   Number of children: Not on file   Years of education: Not on file   Highest education level: Not on file  Occupational History   Occupation: Retired Pharmacist, hospital  Tobacco Use   Smoking status: Never   Smokeless tobacco: Never  Vaping Use   Vaping Use: Never used  Substance and Sexual Activity   Alcohol use: No   Drug use: No   Sexual activity: Never  Other Topics Concern   Not on file  Social History Narrative   Diet: Low sodium    Do you drink/eat things with caffeine? Yes, tea   Widowed, married 1952   Lives in a house, yes- 1 or more stories, 1 person in home. No pets    Current/past profession- Teacher    Patient exercises with walker twice daily        Social Determinants of Health   Financial Resource Strain: Low Risk  (02/23/2018)   Overall Financial Resource Strain (CARDIA)    Difficulty of Paying Living Expenses: Not hard at all  Food Insecurity: No Food Insecurity (02/23/2018)   Hunger Vital Sign    Worried About Running Out of Food in the Last Year: Never true    Ran Out of Food in the Last Year: Never true  Transportation Needs: No Transportation Needs (02/23/2018)   PRAPARE - Hydrologist (Medical): No    Lack of Transportation (Non-Medical): No  Physical Activity: Insufficiently Active (02/23/2018)   Exercise Vital Sign    Days of Exercise per Week: 7 days    Minutes of Exercise per Session: 20 min  Stress: No Stress Concern Present (02/23/2018)   [place]    Feeling of Stress : Not at all  Social Connections: Moderately Isolated (02/23/2018)   Social Connection and Isolation Panel [NHANES]    Frequency of Communication with Friends and Family: Three times a week    Frequency of Social Gatherings with Friends and Family: Once a  week    Attends Religious Services: Never    Marine scientist or Organizations: No    Attends Archivist Meetings: Never    Marital Status: Widowed    Tobacco Counseling Counseling given: Not Answered   Clinical Intake:  Pre-visit preparation completed: Yes  Diabetic?yes         Activities of Daily Living     No data to display          Patient Care Team: Lauree Chandler, NP as PCP - General (Nurse Practitioner) Larey Dresser, MD as PCP - Advanced Heart Failure (Cardiology) Tanda Rockers, MD as Consulting Physician (Pulmonary Disease) Larey Dresser, MD as Consulting Physician (Cardiology)  Indicate any recent Medical Services you may have received from other than [facility] providers in the past year (date may be approximate).     Assessment:   This is a routine wellness examination for [place].  Hearing/Vision screen Hearing Screening - Comments:: Wears hearing aids, not working as well as preferred  Vision Screening - Comments:: Last eye exam less than 12 months ago with Dr.Shapiro   Dietary issues and exercise activities discussed:     Goals Addressed   None    Depression Screen    03/17/2022   12:55 PM 01/17/2022   10:08 AM 03/10/2021    1:38 PM 07/03/2020    9:21 AM 03/06/2020    3:42 PM 07/01/2019    9:52 AM 02/27/2019   10:27 AM  PHQ 2/9 Scores  PHQ - 2 Score 0 0 0 0 0 0 0    Fall Risk    03/17/2022   12:54 PM 01/17/2022   10:07 AM 09/10/2021   10:28 AM 03/10/2021    1:38 PM 02/26/2021   10:40 AM  [place] in the past year? 0 0 0 0 0  Number falls in past yr: 0 0 0 0 0  Injury with Fall? 0 0 0 0 0  Risk for fall due to : No Fall Risks No Fall Risks No Fall Risks No Fall Risks No Fall Risks  Follow up Falls evaluation completed Falls evaluation completed Falls evaluation completed Falls evaluation completed Falls evaluation completed    FALL RISK PREVENTION PERTAINING TO THE HOME:  Any stairs  in or around the home? Yes  If so, are there any without handrails? No  Home free of loose throw rugs in walkways, pet beds, electrical cords, etc? Yes  Adequate lighting in your home to reduce risk of falls? Yes   ASSISTIVE DEVICES UTILIZED TO PREVENT FALLS:  Life alert? Yes  Use of a cane, walker or w/c? Yes  Grab bars in the bathroom? No  Shower chair or bench in shower? Yes  Elevated toilet seat or a handicapped toilet? No   TIMED UP AND GO:  Was the test performed? No .    Cognitive Function:    02/27/2019   10:33 AM 02/23/2018   10:15 AM 02/17/2017   11:06 AM 02/11/2016   11:00 AM 09/04/2014   11:32 AM  MMSE - Mini Mental State Exam  Orientation to time '5 5 5 5 5  '$ Orientation to Place '5 3 4 5 5  '$ Registration '3 3 3 3 3  '$ Attention/ Calculation '5 5 5 5 5  '$ Recall '3 2 1 2 3  '$ Language- name 2 objects '2 2 2 2 2  '$ Language- repeat '1 1 1 1 1  '$ Language- follow 3 step command '3 3 2 3 3  '$ Language- read & follow direction '1 1 1 1 1  '$ Write a sentence '1 1 1 1 1  '$ Copy design '1 1 1 1 1  '$ Total score '30 27 26 29 30        '$ 03/17/2022   12:56 PM  03/10/2021    1:40 PM 03/06/2020    3:43 PM  6CIT Screen  What Year? 0 points 0 points 0 points  What month? 0 points 0 points 0 points  What time? 0 points 0 points 0 points  Count back from 20 0 points 0 points 0 points  Months in reverse 0 points 0 points 0 points  Repeat phrase 0 points 0 points 2 points  Total Score 0 points 0 points 2 points    Immunizations Immunization History  Administered Date(s) Administered   PFIZER(Purple Top)SARS-COV-2 Vaccination 09/21/2019, 10/14/2019, 06/03/2020   Pneumococcal Polysaccharide-23 04/06/2009   Td 04/06/2009    TDAP status: Due, Education has been provided regarding the importance of this vaccine. Advised may receive this vaccine at local pharmacy or Health Dept. Aware to provide a copy of the vaccination record if obtained from local pharmacy or Health Dept. Verbalized acceptance and  understanding.  Flu Vaccine status: Declined, Education has been provided regarding the importance of this vaccine but patient still declined. Advised may receive this vaccine at local pharmacy or Health Dept. Aware to provide a copy of the vaccination record if obtained from local pharmacy or Health Dept. Verbalized acceptance and understanding.  Pneumococcal vaccine status: Due, Education has been provided regarding the importance of this vaccine. Advised may receive this vaccine at local pharmacy or Health Dept. Aware to provide a copy of the vaccination record if obtained from local pharmacy or Health Dept. Verbalized acceptance and understanding.  Covid-19 vaccine status: Information provided on how to obtain vaccines.   Qualifies for Shingles Vaccine? Yes   Zostavax completed No   Shingrix Completed?: No.    Education has been provided regarding the importance of this vaccine. Patient has been advised to call insurance company to determine out of pocket expense if they have not yet received this vaccine. Advised may also receive vaccine at local pharmacy or Health Dept. Verbalized acceptance and understanding.  Screening Tests Health Maintenance  Topic Date Due   Zoster Vaccines- Shingrix (1 of 2) Never done   Pneumonia Vaccine 66+ Years old (2 - PCV) 04/06/2010   TETANUS/TDAP  04/07/2019   COVID-19 Vaccine (4 - Pfizer series) 07/29/2020   INFLUENZA VACCINE  08/15/2048 (Originally 03/15/2022)   OPHTHALMOLOGY EXAM  07/15/2022   HEMOGLOBIN A1C  07/19/2022   FOOT EXAM  09/10/2022   URINE MICROALBUMIN  10/21/2022   DEXA SCAN  Completed   HPV VACCINES  Aged Out    Health Maintenance  Health Maintenance Due  Topic Date Due   Zoster Vaccines- Shingrix (1 of 2) Never done   Pneumonia Vaccine 28+ Years old (2 - PCV) 04/06/2010   TETANUS/TDAP  04/07/2019   COVID-19 Vaccine (4 - Pfizer series) 07/29/2020    Colorectal cancer screening: No longer required.   Mammogram status: No  longer required due to age.  Bone Density status: Completed [id number]. Results reflect: Bone density results: OSTEOPOROSIS. Repeat every 2 years.  Lung Cancer Screening: (Low Dose CT Chest recommended if Age 63-80 years, 30 pack-year currently smoking OR have quit w/in 15years.) does not qualify.   Lung Cancer Screening Referral: na  Additional Screening:  Hepatitis C Screening: does not qualify;  Vision Screening: Recommended annual ophthalmology exams for early detection of glaucoma and other disorders of the eye. Is the patient up to date with their annual eye exam?  Yes  Who is the provider or what is the name of the office in which the patient attends annual eye exams?  Dr. Lester Kinsman  If pt is not established with a provider, would they like to be referred to a provider to establish care? No .   Dental Screening: Recommended annual dental exams for proper oral hygiene  Community Resource Referral / Chronic Care Management: CRR required this visit?  No   CCM required this visit?  No      Plan:     I have personally reviewed and noted the following in the patient's chart:   Medical and social history Use of alcohol, tobacco or illicit drugs  Current medications and supplements including opioid prescriptions.  Functional ability and status Nutritional status Physical activity Advanced directives List of other physicians Hospitalizations, surgeries, and ER visits in previous 12 months Vitals Screenings to include cognitive, depression, and falls Referrals and appointments  In addition, I have reviewed and discussed with patient certain preventive protocols, quality metrics, and best practice recommendations. A written personalized care plan for preventive services as well as general preventive health recommendations were provided to patient.     Lauree Chandler, NP   03/17/2022    Virtual Visit via Telephone Note  I connected with patient 03/17/22 at  1:00 PM EDT by  telephone and verified that I am speaking with the correct person using two identifiers.  Location: Patient: home Provider: [facility]   I discussed the limitations, risks, security and privacy concerns of performing an evaluation and management service by telephone and the availability of in person appointments. I also discussed with the patient that there may be a patient responsible charge related to this service. The patient expressed understanding and agreed to proceed.   I discussed the assessment and treatment plan with the patient. The patient was provided an opportunity to ask questions and all were answered. The patient agreed with the plan and demonstrated an understanding of the instructions.   The patient was advised to call back or seek an in-person evaluation if the symptoms worsen or if the condition fails to improve as anticipated.  I provided 15 minutes of non-face-to-face time during this encounter.  Carlos American. [facility] [employer] printed and mailed

## 2022-03-17 NOTE — Progress Notes (Signed)
   This service is provided via telemedicine  No vital signs collected/recorded due to the encounter was a telemedicine visit.   Location of patient (ex: home, work):  Home  Patient consents to a telephone visit: Yes, see telephone visit dated 03/17/22  Location of the provider (ex: office, home):  [place], [place] Location   Name of any referring provider:  N/A  Names of all persons participating in the telemedicine service and their role in the encounter:  S.Chrae B/CMA, Sherrie Mustache, NP, and Patient   Time spent on call:  7 min with medical assistant

## 2022-03-17 NOTE — Patient Instructions (Signed)
Melissa Shannon , Thank you for taking time to come for your Medicare Wellness Visit. I appreciate your ongoing commitment to your health goals. Please review the following plan we discussed and let me know if I can assist you in the future.   Screening recommendations/referrals: Colonoscopy aged out Mammogram aged out Bone Density up to date Recommended yearly ophthalmology/optometry visit for glaucoma screening and checkup Recommended yearly dental visit for hygiene and checkup  Vaccinations: Influenza vaccine- you have declined vaccine Pneumococcal vaccine DUE- recommend to get at your local pharmacy    Tdap vaccine DUE- recommend to get at your local pharmacy    Shingles vaccine DUE- recommend to get at your local pharmacy       Advanced directives: on file.   Conditions/risks identified: advanced age, obesity, osteoporosis  Next appointment: yearly for awv   Preventive Care 27 Years and Older, Female Preventive care refers to lifestyle choices and visits with your health care provider that can promote health and wellness. What does preventive care include? A yearly physical exam. This is also called an annual well check. Dental exams once or twice a year. Routine eye exams. Ask your health care provider how often you should have your eyes checked. Personal lifestyle choices, including: Daily care of your teeth and gums. Regular physical activity. Eating a healthy diet. Avoiding tobacco and drug use. Limiting alcohol use. Practicing safe sex. Taking low-dose aspirin every day. Taking vitamin and mineral supplements as recommended by your health care provider. What happens during an annual well check? The services and screenings done by your health care provider during your annual well check will depend on your age, overall health, lifestyle risk factors, and family history of disease. Counseling  Your health care provider may ask you questions about your: Alcohol  use. Tobacco use. Drug use. Emotional well-being. Home and relationship well-being. Sexual activity. Eating habits. History of falls. Memory and ability to understand (cognition). Work and work Statistician. Reproductive health. Screening  You may have the following tests or measurements: Height, weight, and BMI. Blood pressure. Lipid and cholesterol levels. These may be checked every 5 years, or more frequently if you are over 98 years old. Skin check. Lung cancer screening. You may have this screening every year starting at age 75 if you have a 30-pack-year history of smoking and currently smoke or have quit within the past 15 years. Fecal occult blood test (FOBT) of the stool. You may have this test every year starting at age 40. Flexible sigmoidoscopy or colonoscopy. You may have a sigmoidoscopy every 5 years or a colonoscopy every 10 years starting at age 54. Hepatitis C blood test. Hepatitis B blood test. Sexually transmitted disease (STD) testing. Diabetes screening. This is done by checking your blood sugar (glucose) after you have not eaten for a while (fasting). You may have this done every 1-3 years. Bone density scan. This is done to screen for osteoporosis. You may have this done starting at age 97. Mammogram. This may be done every 1-2 years. Talk to your health care provider about how often you should have regular mammograms. Talk with your health care provider about your test results, treatment options, and if necessary, the need for more tests. Vaccines  Your health care provider may recommend certain vaccines, such as: Influenza vaccine. This is recommended every year. Tetanus, diphtheria, and acellular pertussis (Tdap, Td) vaccine. You may need a Td booster every 10 years. Zoster vaccine. You may need this after age 42. Pneumococcal 13-valent  conjugate (PCV13) vaccine. One dose is recommended after age 37. Pneumococcal polysaccharide (PPSV23) vaccine. One dose is  recommended after age 75. Talk to your health care provider about which screenings and vaccines you need and how often you need them. This information is not intended to replace advice given to you by your health care provider. Make sure you discuss any questions you have with your health care provider. Document Released: 08/28/2015 Document Revised: 04/20/2016 Document Reviewed: 06/02/2015 Elsevier Interactive Patient Education  2017 [place] Prevention in the Home Falls can cause injuries. They can happen to people of all ages. There are many things you can do to make your home safe and to help prevent falls. What can I do on the outside of my home? Regularly fix the edges of walkways and driveways and fix any cracks. Remove anything that might make you trip as you walk through a door, such as a raised step or threshold. Trim any bushes or trees on the path to your home. Use bright outdoor lighting. Clear any walking paths of anything that might make someone trip, such as rocks or tools. Regularly check to see if handrails are loose or broken. Make sure that both sides of any steps have handrails. Any raised decks and porches should have guardrails on the edges. Have any leaves, snow, or ice cleared regularly. Use sand or salt on walking paths during winter. Clean up any spills in your garage right away. This includes oil or grease spills. What can I do in the bathroom? Use night lights. Install grab bars by the toilet and in the tub and shower. Do not use towel bars as grab bars. Use non-skid mats or decals in the tub or shower. If you need to sit down in the shower, use a plastic, non-slip stool. Keep the floor dry. Clean up any water that spills on the floor as soon as it happens. Remove soap buildup in the tub or shower regularly. Attach bath mats securely with double-sided non-slip rug tape. Do not have throw rugs and other things on the floor that can make you  trip. What can I do in the bedroom? Use night lights. Make sure that you have a light by your bed that is easy to reach. Do not use any sheets or blankets that are too big for your bed. They should not hang down onto the floor. Have a firm chair that has side arms. You can use this for support while you get dressed. Do not have throw rugs and other things on the floor that can make you trip. What can I do in the kitchen? Clean up any spills right away. Avoid walking on wet floors. Keep items that you use a lot in easy-to-reach places. If you need to reach something above you, use a strong step stool that has a grab bar. Keep electrical cords out of the way. Do not use floor polish or wax that makes floors slippery. If you must use wax, use non-skid floor wax. Do not have throw rugs and other things on the floor that can make you trip. What can I do with my stairs? Do not leave any items on the stairs. Make sure that there are handrails on both sides of the stairs and use them. Fix handrails that are broken or loose. Make sure that handrails are as long as the stairways. Check any carpeting to make sure that it is firmly attached to the stairs. Fix any carpet that  is loose or worn. Avoid having throw rugs at the top or bottom of the stairs. If you do have throw rugs, attach them to the floor with carpet tape. Make sure that you have a light switch at the top of the stairs and the bottom of the stairs. If you do not have them, ask someone to add them for you. What else can I do to help prevent falls? Wear shoes that: Do not have high heels. Have rubber bottoms. Are comfortable and fit you well. Are closed at the toe. Do not wear sandals. If you use a stepladder: Make sure that it is fully opened. Do not climb a closed stepladder. Make sure that both sides of the stepladder are locked into place. Ask someone to hold it for you, if possible. Clearly mark and make sure that you can  see: Any grab bars or handrails. First and last steps. Where the edge of each step is. Use tools that help you move around (mobility aids) if they are needed. These include: Canes. Walkers. Scooters. Crutches. Turn on the lights when you go into a dark area. Replace any light bulbs as soon as they burn out. Set up your furniture so you have a clear path. Avoid moving your furniture around. If any of your floors are uneven, fix them. If there are any pets around you, be aware of where they are. Review your medicines with your doctor. Some medicines can make you feel dizzy. This can increase your chance of falling. Ask your doctor what other things that you can do to help prevent falls. This information is not intended to replace advice given to you by your health care provider. Make sure you discuss any questions you have with your health care provider. Document Released: 05/28/2009 Document Revised: 01/07/2016 Document Reviewed: 09/05/2014 Elsevier Interactive Patient Education  2017 [employer].

## 2022-03-17 NOTE — Telephone Encounter (Signed)
Ms. vonceil, upshur are scheduled for a virtual visit with your provider today.    Just as we do with appointments in the office, we must obtain your consent to participate.  Your consent will be active for this visit and any virtual visit you may have with one of our providers in the next 365 days.    If you have a MyChart account, I can also send a copy of this consent to you electronically.  All virtual visits are billed to your insurance company just like a traditional visit in the office.  As this is a virtual visit, video technology does not allow for your provider to perform a traditional examination.  This may limit your provider's ability to fully assess your condition.  If your provider identifies any concerns that need to be evaluated in person or the need to arrange testing such as labs, EKG, etc, we will make arrangements to do so.    Although advances in technology are sophisticated, we cannot ensure that it will always work on either your end or our end.  If the connection with a video visit is poor, we may have to switch to a telephone visit.  With either a video or telephone visit, we are not always able to ensure that we have a secure connection.   I need to obtain your verbal consent now.   Are you willing to proceed with your visit today?   Cataleah Stites has provided verbal consent on 03/17/2022 for a virtual visit (video or telephone).   [name] Holcomb, [place] 03/17/2022  1:00 PM

## 2022-03-28 ENCOUNTER — Ambulatory Visit (INDEPENDENT_AMBULATORY_CARE_PROVIDER_SITE_OTHER): Payer: Medicare PPO | Admitting: *Deleted

## 2022-03-28 DIAGNOSIS — M810 Age-related osteoporosis without current pathological fracture: Secondary | ICD-10-CM | POA: Diagnosis not present

## 2022-03-28 MED ORDER — DENOSUMAB 60 MG/ML ~~LOC~~ SOSY
60.0000 mg | PREFILLED_SYRINGE | Freq: Once | SUBCUTANEOUS | Status: AC
  Administered 2022-03-28: 60 mg via SUBCUTANEOUS

## 2022-04-07 ENCOUNTER — Other Ambulatory Visit: Payer: Self-pay | Admitting: Nurse Practitioner

## 2022-04-07 DIAGNOSIS — R413 Other amnesia: Secondary | ICD-10-CM

## 2022-04-14 ENCOUNTER — Other Ambulatory Visit: Payer: Self-pay | Admitting: Nurse Practitioner

## 2022-04-14 DIAGNOSIS — E1169 Type 2 diabetes mellitus with other specified complication: Secondary | ICD-10-CM

## 2022-04-15 ENCOUNTER — Other Ambulatory Visit: Payer: Self-pay

## 2022-04-15 ENCOUNTER — Other Ambulatory Visit: Payer: Self-pay | Admitting: Nurse Practitioner

## 2022-04-15 DIAGNOSIS — E1122 Type 2 diabetes mellitus with diabetic chronic kidney disease: Secondary | ICD-10-CM

## 2022-04-15 DIAGNOSIS — M810 Age-related osteoporosis without current pathological fracture: Secondary | ICD-10-CM

## 2022-04-19 ENCOUNTER — Other Ambulatory Visit: Payer: Medicare PPO

## 2022-04-22 ENCOUNTER — Encounter: Payer: Self-pay | Admitting: Nurse Practitioner

## 2022-04-22 ENCOUNTER — Ambulatory Visit: Payer: Medicare PPO | Admitting: Nurse Practitioner

## 2022-04-22 VITALS — BP 110/70 | HR 93 | Temp 97.5°F | Resp 16 | Ht 68.0 in | Wt 203.8 lb

## 2022-04-22 DIAGNOSIS — M810 Age-related osteoporosis without current pathological fracture: Secondary | ICD-10-CM | POA: Diagnosis not present

## 2022-04-22 DIAGNOSIS — I10 Essential (primary) hypertension: Secondary | ICD-10-CM

## 2022-04-22 DIAGNOSIS — E1169 Type 2 diabetes mellitus with other specified complication: Secondary | ICD-10-CM

## 2022-04-22 DIAGNOSIS — N1832 Chronic kidney disease, stage 3b: Secondary | ICD-10-CM

## 2022-04-22 DIAGNOSIS — E162 Hypoglycemia, unspecified: Secondary | ICD-10-CM

## 2022-04-22 DIAGNOSIS — E785 Hyperlipidemia, unspecified: Secondary | ICD-10-CM | POA: Diagnosis not present

## 2022-04-22 DIAGNOSIS — E1122 Type 2 diabetes mellitus with diabetic chronic kidney disease: Secondary | ICD-10-CM

## 2022-04-22 DIAGNOSIS — I5032 Chronic diastolic (congestive) heart failure: Secondary | ICD-10-CM

## 2022-04-22 DIAGNOSIS — Z794 Long term (current) use of insulin: Secondary | ICD-10-CM

## 2022-04-22 DIAGNOSIS — E034 Atrophy of thyroid (acquired): Secondary | ICD-10-CM

## 2022-04-22 DIAGNOSIS — Z86711 Personal history of pulmonary embolism: Secondary | ICD-10-CM

## 2022-04-22 NOTE — Progress Notes (Signed)
Careteam: Patient Care Team: Lauree Chandler, NP as PCP - General (Nurse Practitioner) Larey Dresser, MD as PCP - Advanced Heart Failure (Cardiology) Tanda Rockers, MD as Consulting Physician (Pulmonary Disease) Larey Dresser, MD as Consulting Physician (Cardiology)  PLACE OF SERVICE:  [place] Directive information Does Patient Have a Medical Advance Directive?: Yes, Type of Advance Directive: Out of facility DNR (pink MOST or yellow form), Does patient want to make changes to medical advance directive?: No - Patient declined  Allergies  Allergen Reactions   Lisinopril Shortness Of Breath    Tracheal Edema   Penicillins Other (See Comments)    DID THE REACTION INVOLVE: Swelling of the face/tongue/throat, SOB, or low BP? Yes Sudden or severe rash/hives, skin peeling, or the inside of the mouth or nose? No Did it require medical treatment? No When did it last happen?      more than 10 years  If all above answers are "NO", may proceed with cephalosporin use.   Throat felt tight    Chief Complaint  Patient presents with   Medical Management of Chronic Issues    3 month follow up.   Immunizations    Discuss the need for Shingrix vaccine, Pne vaccine, Tetanus vaccine, and Covid Booster.      HPI: Patient is a 88 y.o. female for routine follow up.  Doing much better than last visit.   Continues to have bladder incontinence .  Still sees cardiologist , but hasn't gone for over a year - due for follow up  Chronic lymphedema, swelling hasn't changed  Walks often uses walker   Doesn't want flu vaccine  Reports blood sugars have been well controlled Denies hypogylcemia  No dizzineness since last visit  Diabetic foot exam performed at this visit   Review of Systems:  Review of Systems  Constitutional:  Negative for fever and weight loss.  HENT:  Negative for hearing loss.   Eyes:  Negative for blurred vision.  Respiratory:  Negative for cough,  shortness of breath and wheezing.   Cardiovascular:  Positive for leg swelling. Negative for chest pain and palpitations.  Gastrointestinal:  Positive for diarrhea. Negative for abdominal pain and heartburn.  Genitourinary:  Negative for dysuria and hematuria.       Incontinent of bladder  Musculoskeletal:  Negative for falls and myalgias.  Skin:  Negative for itching and rash.  Neurological:  Negative for dizziness, tingling and headaches.  Psychiatric/Behavioral:  Negative for depression. The patient is not nervous/anxious.     Past Medical History:  Diagnosis Date   Arthritis    bilateral knees   Chronic diastolic CHF (congestive heart failure) (HCC)    CKD (chronic kidney disease), stage III (HCC)    Diabetes mellitus without complication ([place])    Does use hearing aid    Edema    Hyperlipidemia    Hypertension    Hypothyroidism    Pulmonary embolism ([place])    Urinary incontinence    Past Surgical History:  Procedure Laterality Date   BLADDER SURGERY  2000   Dr.Tannerbaum (Bladder Tact)   CATARACT EXTRACTION  03/2013   Dr.Shapiro   CHOLECYSTECTOMY     EYE SURGERY     bilateral cataracts   [facility]    right elbow  1999   x 2, still has one piece of metal in it   [place]   Dr.Brewer  Social History:   reports that she has never smoked. She has never used smokeless tobacco. She reports that she does not drink alcohol and does not use drugs.  Family History  Problem Relation Age of Onset   Suicidality Father    Cancer Brother    Cancer Maternal Grandfather    Diabetes Maternal Grandmother    Melanoma Brother     Medications: Patient's Medications  New Prescriptions   No medications on file  Previous Medications   ACCU-CHEK GUIDE TEST STRIP    USE DAILY TO TEST BLOOD SUGAR (DX:E11.22)   AMLODIPINE (NORVASC) 5 MG TABLET    TAKE 1 TABLET BY MOUTH EVERY DAY   APIXABAN (ELIQUIS) 5 MG TABS TABLET     TAKE 1 TABLET BY MOUTH TWICE A DAY   CHOLECALCIFEROL (VITAMIN D3) 5000 UNITS CHEW    Chew 5,000 Units by mouth daily.    DONEPEZIL (ARICEPT) 10 MG TABLET    TAKE 1 TABLET BY MOUTH EVERY DAY AT BEDTIME TO PRESERVE MEMORY   FUROSEMIDE (LASIX) 20 MG TABLET    TAKE 1 TABLET BY MOUTH EVERY DAY   INSULIN DETEMIR (LEVEMIR FLEXTOUCH) 100 UNIT/ML FLEXPEN    Inject 7 Units into the skin daily.   INSULIN PEN NEEDLE (BD PEN NEEDLE NANO 2ND GEN) 32G X 4 MM MISC    1 Device by Other route daily. DX [id number]   LEVOTHYROXINE (SYNTHROID) 88 MCG TABLET    TAKE 1 TABLET BY MOUTH EVERY DAY BEFORE BREAKFAST   LINAGLIPTIN (TRADJENTA) 5 MG TABS TABLET    TAKE 1 TABLET BY MOUTH EVERY DAY   MULTIPLE VITAMIN (MULTIVITAMIN WITH MINERALS) TABS TABLET    Take 1 tablet by mouth daily.   NUTRITIONAL SUPPLEMENTS (GLUCERNA SHAKE PO)    Take by mouth.   ONETOUCH DELICA LANCETS 31V MISC    USE EVERY OTHER DAY TO TEST BLOOD SUGAR   ROSUVASTATIN (CRESTOR) 10 MG TABLET    Take 1 tablet (10 mg total) by mouth daily.  Modified Medications   No medications on file  Discontinued Medications   No medications on file    Physical Exam:  Vitals:   04/22/22 1025  BP: 110/70  Pulse: 93  Resp: 16  Temp: (!) 97.5 F (36.4 C)  SpO2: 95%  Weight: 92.4 kg  Height: '5\' 8"'$  (1.727 m)   Body mass index is 30.99 kg/m. Wt Readings from Last 3 Encounters:  04/22/22 203 lb 12.8 oz (92.4 kg)  01/17/22 195 lb (88.5 kg)  09/10/21 204 lb (92.5 kg)    Physical Exam Constitutional:      General: She is not in acute distress. HENT:     Mouth/Throat:     Mouth: Mucous membranes are moist.  Eyes:     Conjunctiva/sclera: Conjunctivae normal.  Cardiovascular:     Rate and Rhythm: Normal rate and regular rhythm.     Pulses: Normal pulses.     Heart sounds: Normal heart sounds. No murmur heard. Pulmonary:     Effort: Pulmonary effort is normal. No respiratory distress.     Breath sounds: Normal breath sounds. No wheezing.  Abdominal:      General: Bowel sounds are normal. There is no distension.     Palpations: Abdomen is soft.     Tenderness: There is no abdominal tenderness.  Musculoskeletal:     Right lower leg: Edema present.     Left lower leg: Edema present.     Comments: Chronic lymphedema  Skin:  General: Skin is warm and dry.  Neurological:     Mental Status: She is alert and oriented to person, place, and time. Mental status is at baseline.     Gait: Gait abnormal.     Comments: Ambulates with walker   Psychiatric:        Mood and Affect: Mood normal.        Behavior: Behavior normal.     Labs reviewed: Basic Metabolic Panel: Recent Labs    06/28/21 1604 10/15/21 0932 01/17/22 1041  NA 142 143 141  K 4.1 4.4 3.9  CL 102 106 100  CO2 29 26 32  GLUCOSE 103 139* 200*  BUN 33* 25 34*  CREATININE 1.54* 1.38* 1.62*  CALCIUM 10.4 10.6* 10.8*  TSH  --  1.86  --    Liver Function Tests: Recent Labs    06/28/21 1604 10/15/21 0932 01/17/22 1041  AST 39* 14 22  ALT '24 10 24  '$ BILITOT 0.6 0.6 0.8  PROT 6.3 6.3 5.9*   No results for input(s): "LIPASE", "AMYLASE" in the last 8760 hours. No results for input(s): "AMMONIA" in the last 8760 hours. CBC: Recent Labs    06/28/21 1604 10/15/21 0932 01/17/22 1041  WBC 9.3 7.2 7.6  NEUTROABS 5,552 3,852 5,024  HGB 14.9 14.9 14.0  HCT 46.2* 45.9* 43.6  MCV 90.9 90.4 90.3  PLT 230 211 177   Lipid Panel: Recent Labs    10/15/21 0932 01/17/22 1041  CHOL 289* 168  HDL 74 57  LDLCALC 180* 83  TRIG 190* 189*  CHOLHDL 3.9 2.9   TSH: Recent Labs    10/15/21 0932  TSH 1.86   A1C: Lab Results  Component Value Date   HGBA1C 7.6 (H) 01/17/2022     Assessment/Plan 1. Type 2 diabetes mellitus with stage 3b chronic kidney disease, with long-term current use of insulin (HCC) - titrating insulin to goal w/o hypoglycemia, a1c was up at last visit. Will follow up today - continue insulin detemir - due to for diabetic eye exam in  December  2. Hypothyroidism due to acquired atrophy of thyroid - continue synthroid, TSH at goal in [place]  3. Primary hypertension - continue norvasc -Blood pressure well controlled, goal bp <140/90 Continue current medications and dietary modifications follow metabolic panel  4. Hyperlipidemia associated with type 2 diabetes mellitus ([place]) - continue crestor, LDL trending down on last labs.   5. Hx of pulmonary embolus -continue eliquis, no signs of bleeding  6. Chronic diastolic CHF (congestive heart failure) (HCC) - continue lasix, euvolemic at this time.  - encouraged pt to make cardiology appt.  7. Hypoglycemia -denies recent events  Return in about 6 months (around 10/21/2022) for routine.  Student- Waunita Schooner, RN -I personally was present during the history, physical exam and medical decision-making activities of this service and have verified that the service and findings are accurately documented in the student's note  Jadasia Haws K. [place], [facility] [phone]

## 2022-04-23 LAB — COMPLETE METABOLIC PANEL WITH GFR
AG Ratio: 1.6 (calc) (ref 1.0–2.5)
ALT: 22 U/L (ref 6–29)
AST: 16 U/L (ref 10–35)
Albumin: 3.9 g/dL (ref 3.6–5.1)
Alkaline phosphatase (APISO): 46 U/L (ref 37–153)
BUN/Creatinine Ratio: 24 (calc) — ABNORMAL HIGH (ref 6–22)
BUN: 34 mg/dL — ABNORMAL HIGH (ref 7–25)
CO2: 24 mmol/L (ref 20–32)
Calcium: 10.3 mg/dL (ref 8.6–10.4)
Chloride: 107 mmol/L (ref 98–110)
Creat: 1.4 mg/dL — ABNORMAL HIGH (ref 0.60–0.95)
Globulin: 2.5 g/dL (calc) (ref 1.9–3.7)
Glucose, Bld: 150 mg/dL — ABNORMAL HIGH (ref 65–139)
Potassium: 4.9 mmol/L (ref 3.5–5.3)
Sodium: 142 mmol/L (ref 135–146)
Total Bilirubin: 0.6 mg/dL (ref 0.2–1.2)
Total Protein: 6.4 g/dL (ref 6.1–8.1)
eGFR: 35 mL/min/{1.73_m2} — ABNORMAL LOW (ref >=60)

## 2022-04-23 LAB — HEMOGLOBIN A1C
Hgb A1c MFr Bld: 7.1 % of total Hgb — ABNORMAL HIGH (ref <5.7)
Mean Plasma Glucose: 157 mg/dL
eAG (mmol/L): 8.7 mmol/L

## 2022-04-26 ENCOUNTER — Other Ambulatory Visit: Payer: Self-pay | Admitting: Nurse Practitioner

## 2022-04-26 DIAGNOSIS — E1169 Type 2 diabetes mellitus with other specified complication: Secondary | ICD-10-CM

## 2022-05-31 ENCOUNTER — Other Ambulatory Visit: Payer: Self-pay | Admitting: Nurse Practitioner

## 2022-05-31 DIAGNOSIS — I509 Heart failure, unspecified: Secondary | ICD-10-CM

## 2022-06-09 ENCOUNTER — Ambulatory Visit: Payer: Self-pay

## 2022-06-09 NOTE — Patient Instructions (Signed)
Visit Information  Thank you for taking time to visit with me today. Please don't hesitate to contact me if I can be of assistance to you.   Following are the goals we discussed today:   Goals Addressed             This Visit's Progress    COMPLETED: Care Coordination Activities       Care Coordination Interventions: SDoH screening performed - no acute resource challenges identified at this time Determined the patient does not have concerns with medication costs and/or adherence Education on the role of the care coordination team - no follow up desired at this time Instructed the patient to contact her primary care provider as needed         If you are experiencing a Mental Health or [place] or need someone to talk to, please call 1-800-273-TALK (toll free, 24 hour hotline)  The patient verbalized understanding of instructions, educational materials, and care plan provided today and DECLINED offer to receive copy of patient instructions, educational materials, and care plan.   No further follow up required: Please contact your primary care provider as needed  Daneen Schick, Arita Miss, CDP Social Worker, Certified Dementia Practitioner [place] [facility] [phone]

## 2022-06-09 NOTE — Patient Outreach (Signed)
  Care Coordination   Initial Visit Note   06/09/2022 Name: Melissa Shannon MRN: [id number] DOB: [birth year]  Melissa Shannon is a 88 y.o. year old female who sees Dewaine Oats, Carlos American, NP for primary care. I spoke with  Andi Hence by phone today.  What matters to the patients health and wellness today?  No concerns, doing well    Goals Addressed             This Visit's Progress    COMPLETED: Care Coordination Activities       Care Coordination Interventions: SDoH screening performed - no acute resource challenges identified at this time Determined the patient does not have concerns with medication costs and/or adherence Education on the role of the care coordination team - no follow up desired at this time Instructed the patient to contact her primary care provider as needed         SDOH assessments and interventions completed:  Yes  SDOH Interventions Today    Flowsheet Row Most Recent Value  SDOH Interventions   Food Insecurity Interventions Intervention Not Indicated  Housing Interventions Intervention Not Indicated  Transportation Interventions Intervention Not Indicated  Utilities Interventions Intervention Not Indicated        Care Coordination Interventions Activated:  Yes  Care Coordination Interventions:  Yes, provided   Follow up plan: No further intervention required.   Encounter Outcome:  Pt. Visit Completed   Daneen Schick, BSW, CDP Social Worker, Certified Dementia Practitioner [facility]  Care Coordination [phone]

## 2022-06-26 ENCOUNTER — Other Ambulatory Visit: Payer: Self-pay | Admitting: Nurse Practitioner

## 2022-07-06 ENCOUNTER — Other Ambulatory Visit: Payer: Self-pay | Admitting: Nurse Practitioner

## 2022-07-08 ENCOUNTER — Other Ambulatory Visit: Payer: Self-pay | Admitting: Adult Health

## 2022-07-08 ENCOUNTER — Other Ambulatory Visit: Payer: Self-pay | Admitting: Nurse Practitioner

## 2022-07-08 DIAGNOSIS — N1832 Chronic kidney disease, stage 3b: Secondary | ICD-10-CM

## 2022-07-08 MED ORDER — BD PEN NEEDLE NANO 2ND GEN 32G X 4 MM MISC: 11 refills | Status: DC

## 2022-07-08 MED ORDER — LEVEMIR FLEXTOUCH 100 UNIT/ML ~~LOC~~ SOPN: 7.0000 [IU] | PEN_INJECTOR | Freq: Every day | SUBCUTANEOUS | 3 refills | Status: DC

## 2022-07-11 ENCOUNTER — Telehealth: Payer: Self-pay

## 2022-07-11 NOTE — Telephone Encounter (Signed)
Patient called stating we need to send a rx for her pen needles, as the pharmacy dispensed her a lancet device instead of pen needles.  I reviewed medication list and concluded that this must be a mix up on the pharmacy's behalf as we sent the rx in for pen needles. Patient states she has tried to correct this with the pharmacy and had no success.   Patient is aware that I will call the pharmacy on her behalf. I called [facility] and left a detailed message requesting that someone review patients refill history and affirm that rx was sent in for pen needles versus a lancet device and correct this for the patient. I strongly suggested that the pharmacy call us back if we need to take any additional action to meet the patients needs.

## 2022-07-14 ENCOUNTER — Other Ambulatory Visit: Payer: Self-pay | Admitting: Nurse Practitioner

## 2022-07-14 DIAGNOSIS — E034 Atrophy of thyroid (acquired): Secondary | ICD-10-CM

## 2022-07-15 ENCOUNTER — Telehealth: Payer: Self-pay | Admitting: *Deleted

## 2022-07-15 NOTE — Telephone Encounter (Signed)
Patient notified and agreed.  

## 2022-07-15 NOTE — Telephone Encounter (Signed)
Patient called and stated that she is feeling lightheaded today and wonders if it is coming from missing her Insulin dose on Sunday.   No other symptoms noted. Has been taking her insulin as directed since Sunday. Had ran out of Pen needles and that is why she did not take on Sunday.   Patient took her Blood sugar this afternoon about 30 minutes ago and it was 160. This is the Only reading that she has and stated that she does NOT take it everyday.   Patient wanted to make you aware.  Please Advise.

## 2022-07-15 NOTE — Telephone Encounter (Signed)
Should not be, would make sure she is drinking enough water, could check blood pressure if she continues to be dizzy

## 2022-07-18 ENCOUNTER — Ambulatory Visit (INDEPENDENT_AMBULATORY_CARE_PROVIDER_SITE_OTHER): Payer: Medicare PPO | Admitting: Adult Health

## 2022-07-18 ENCOUNTER — Encounter: Payer: Self-pay | Admitting: Adult Health

## 2022-07-18 VITALS — BP 140/78 | HR 88 | Temp 97.5°F | Resp 18 | Ht 68.0 in | Wt 206.0 lb

## 2022-07-18 DIAGNOSIS — N1832 Chronic kidney disease, stage 3b: Secondary | ICD-10-CM

## 2022-07-18 DIAGNOSIS — E119 Type 2 diabetes mellitus without complications: Secondary | ICD-10-CM | POA: Diagnosis not present

## 2022-07-18 DIAGNOSIS — Z794 Long term (current) use of insulin: Secondary | ICD-10-CM

## 2022-07-18 DIAGNOSIS — E034 Atrophy of thyroid (acquired): Secondary | ICD-10-CM

## 2022-07-18 DIAGNOSIS — E1122 Type 2 diabetes mellitus with diabetic chronic kidney disease: Secondary | ICD-10-CM

## 2022-07-18 DIAGNOSIS — H524 Presbyopia: Secondary | ICD-10-CM | POA: Diagnosis not present

## 2022-07-18 DIAGNOSIS — Z961 Presence of intraocular lens: Secondary | ICD-10-CM | POA: Diagnosis not present

## 2022-07-18 DIAGNOSIS — R42 Dizziness and giddiness: Secondary | ICD-10-CM

## 2022-07-18 DIAGNOSIS — H52203 Unspecified astigmatism, bilateral: Secondary | ICD-10-CM | POA: Diagnosis not present

## 2022-07-18 DIAGNOSIS — Z7984 Long term (current) use of oral hypoglycemic drugs: Secondary | ICD-10-CM | POA: Diagnosis not present

## 2022-07-18 LAB — POCT URINALYSIS DIPSTICK
Blood, UA: NEGATIVE
Glucose, UA: NEGATIVE
Ketones, UA: NEGATIVE
Nitrite, UA: POSITIVE
Protein, UA: NEGATIVE
Spec Grav, UA: >=1.03 — AB (ref 1.010–1.025)
Urobilinogen, UA: 1 E.U./dL
pH, UA: 5 (ref 5.0–8.0)

## 2022-07-18 LAB — GLUCOSE, POCT (MANUAL RESULT ENTRY): POC Glucose: 190 mg/dl — AB (ref 70–99)

## 2022-07-18 LAB — HM DIABETES EYE EXAM

## 2022-07-18 NOTE — Patient Instructions (Addendum)
Vertigo Vertigo is the feeling that you or the things around you are moving when they are not. This feeling can come and go at any time. Vertigo often goes away on its own. This condition can be dangerous if it happens when you are doing activities like driving or working with machines. Your doctor will do tests to find the cause of your vertigo. These tests will also help your doctor decide on the best treatment for you. Follow these instructions at home: Eating and drinking     Drink enough fluid to keep your pee (urine) pale yellow. Do not drink alcohol. Activity Return to your normal activities when your doctor says that it is safe. In the morning, first sit up on the side of the bed. When you feel okay, stand slowly while you hold onto something until you know that your balance is fine. Move slowly. Avoid sudden body or head movements or certain positions, as told by your doctor. Use a cane if you have trouble standing or walking. Sit down right away if you feel dizzy. Avoid doing any tasks or activities that can cause danger to you or others if you get dizzy. Avoid bending down if you feel dizzy. Place items in your home so that they are easy for you to reach without bending or leaning over. Do not drive or use machinery if you feel dizzy. General instructions Take over-the-counter and prescription medicines only as told by your doctor. Keep all follow-up visits. Contact a doctor if: Your medicine does not help your vertigo. Your problems get worse or you have new symptoms. You have a fever. You feel like you may vomit (nauseous), or this feeling gets worse. You start to vomit. Your family or friends see changes in how you act. You lose feeling (have numbness) in part of your body. You feel prickling and tingling in a part of your body. Get help right away if: You are always dizzy. You faint. You get very bad headaches. You get a stiff neck. Bright light starts to bother  you. You have trouble moving or talking. You feel weak in your hands, arms, or legs. You have changes in your hearing or in how you see (vision). These symptoms may be an emergency. Get help right away. Call your local emergency services (911 in the [place] Do not wait to see if the symptoms will go away. Do not drive yourself to the hospital. Summary Vertigo is the feeling that you or the things around you are moving when they are not. Your doctor will do tests to find the cause of your vertigo. You may be told to avoid some tasks, positions, or movements. Contact a doctor if your medicine is not helping, or if you have a fever, new symptoms, or a change in how you act. Get help right away if you get very bad headaches, or if you have changes in how you speak, hear, or see. This information is not intended to replace advice given to you by your health care provider. Make sure you discuss any questions you have with your health care provider. Document Revised: 07/01/2020 Document Reviewed: 07/01/2020 Elsevier Patient Education  2023 Elsevier Inc.  

## 2022-07-18 NOTE — Progress Notes (Signed)
[facility] clinic  Provider:  Durenda Age DNP  Code Status:  DNR  Goals of Care:     04/22/2022   10:29 AM  Advanced Directives  Does Patient Have a Medical Advance Directive? Yes  Type of Advance Directive Out of facility DNR (pink MOST or yellow form)  Does patient want to make changes to medical advance directive? No - Patient declined     Chief Complaint  Patient presents with   Acute Visit    Patient is being seen for dizziness     HPI: Patient is a 88 y.o. female seen today for an acute visit for dizziness. She started getting dizzy 4 days ago. She was not able to take her insulin 2 days ago due to having no insulin syringe needle. She now have the needle and able to take insulin. CBG 2 days ago was 160. She checks her CBG every other day.   Past Medical History:  Diagnosis Date   Arthritis    bilateral knees   Chronic diastolic CHF (congestive heart failure) (HCC)    CKD (chronic kidney disease), stage III (HCC)    Diabetes mellitus without complication ([place])    Does use hearing aid    Edema    Hyperlipidemia    Hypertension    Hypothyroidism    Pulmonary embolism ([place])    Urinary incontinence     Past Surgical History:  Procedure Laterality Date   BLADDER SURGERY  2000   Dr.Tannerbaum (Bladder Tact)   CATARACT EXTRACTION  03/2013   Dr.Shapiro   CHOLECYSTECTOMY     EYE SURGERY     bilateral cataracts   [facility]    right elbow  1999   x 2, still has one piece of metal in it   TONSILLECTOMY AND ADENOIDECTOMY  1937   Dr.Brewer    Allergies  Allergen Reactions   Lisinopril Shortness Of Breath    Tracheal Edema   Penicillins Other (See Comments)    DID THE REACTION INVOLVE: Swelling of the face/tongue/throat, SOB, or low BP? Yes Sudden or severe rash/hives, skin peeling, or the inside of the mouth or nose? No Did it require medical treatment? No When did it last happen?      more than 10 years  If all  above answers are "NO", may proceed with cephalosporin use.   Throat felt tight    Outpatient Encounter Medications as of 07/18/2022  Medication Sig   ACCU-CHEK GUIDE test strip USE DAILY TO TEST BLOOD SUGAR (DX:E11.22)   Accu-Chek Softclix Lancets lancets USE EVERY OTHER DAY TO TEST BLOOD SUGAR   amLODipine (NORVASC) 5 MG tablet TAKE 1 TABLET BY MOUTH EVERY DAY   apixaban (ELIQUIS) 5 MG TABS tablet TAKE 1 TABLET BY MOUTH TWICE A DAY   Cholecalciferol (VITAMIN D3) 5000 UNITS CHEW Chew 5,000 Units by mouth daily.    donepezil (ARICEPT) 10 MG tablet TAKE 1 TABLET BY MOUTH EVERY DAY AT BEDTIME TO PRESERVE MEMORY   furosemide (LASIX) 20 MG tablet TAKE 1 TABLET BY MOUTH EVERY DAY   insulin detemir (LEVEMIR FLEXTOUCH) 100 UNIT/ML FlexPen Inject 7 Units into the skin daily.   Insulin Pen Needle (BD PEN NEEDLE NANO 2ND GEN) 32G X 4 MM MISC USE EVERY DAY   levothyroxine (SYNTHROID) 88 MCG tablet TAKE 1 TABLET BY MOUTH EVERY DAY BEFORE BREAKFAST   Multiple Vitamin (MULTIVITAMIN WITH MINERALS) TABS tablet Take 1 tablet by mouth daily.   Nutritional  Supplements (GLUCERNA SHAKE PO) Take by mouth.   rosuvastatin (CRESTOR) 10 MG tablet Take 1 tablet (10 mg total) by mouth daily.   TRADJENTA 5 MG TABS tablet TAKE 1 TABLET BY MOUTH EVERY DAY   No facility-administered encounter medications on file as of 07/18/2022.    Review of Systems:  Review of Systems  Constitutional:  Negative for appetite change, chills, fatigue and fever.  HENT:  Negative for congestion, hearing loss, rhinorrhea and sore throat.   Eyes: Negative.   Respiratory:  Negative for cough, shortness of breath and wheezing.   Cardiovascular:  Negative for chest pain, palpitations and leg swelling.  Gastrointestinal:  Negative for abdominal pain, constipation, diarrhea, nausea and vomiting.  Genitourinary:  Negative for dysuria.  Musculoskeletal:  Negative for arthralgias, back pain and myalgias.  Skin:  Negative for color change, rash  and wound.  Neurological:  Positive for dizziness and light-headedness. Negative for weakness and headaches.  Psychiatric/Behavioral:  Negative for behavioral problems. The patient is not nervous/anxious.     Health Maintenance  Topic Date Due   Zoster Vaccines- Shingrix (1 of 2) Never done   Pneumonia Vaccine 76+ Years old (2 - PCV) 04/06/2010   DTaP/Tdap/Td (2 - Tdap) 04/07/2019   COVID-19 Vaccine (4 - 2023-24 season) 04/15/2022   OPHTHALMOLOGY EXAM  07/15/2022   INFLUENZA VACCINE  08/15/2048 (Originally 03/15/2022)   FOOT EXAM  09/10/2022   HEMOGLOBIN A1C  10/21/2022   Medicare Annual Wellness (AWV)  03/18/2023   DEXA SCAN  Completed   HPV VACCINES  Aged Out    Physical Exam: Vitals:   07/18/22 1050 07/18/22 1102  BP: (!) 140/78   Pulse: 88 88  Resp: 18   Temp: (!) 97.5 F (36.4 C)   SpO2: 96%   Weight: 206 lb (93.4 kg)   Height: _0  (1.727 m)    Body mass index is 31.32 kg/m. Physical Exam Constitutional:      General: She is not in acute distress.    Appearance: She is obese.  HENT:     Head: Normocephalic and atraumatic.     Nose: Nose normal.     Mouth/Throat:     Mouth: Mucous membranes are moist.  Eyes:     Conjunctiva/sclera: Conjunctivae normal.  Cardiovascular:     Rate and Rhythm: Normal rate and regular rhythm.  Pulmonary:     Effort: Pulmonary effort is normal.     Breath sounds: Normal breath sounds.  Abdominal:     General: Bowel sounds are normal.     Palpations: Abdomen is soft.  Musculoskeletal:        General: Normal range of motion.     Cervical back: Normal range of motion.  Skin:    General: Skin is warm and dry.  Neurological:     General: No focal deficit present.     Mental Status: She is alert and oriented to person, place, and time.  Psychiatric:        Mood and Affect: Mood normal.        Behavior: Behavior normal.        Thought Content: Thought content normal.        Judgment: Judgment normal.     Labs  reviewed: Basic Metabolic Panel: Recent Labs    10/15/21 0932 01/17/22 1041 04/22/22 1128  NA 143 141 142  K 4.4 3.9 4.9  CL 106 100 107  CO2 26 32 24  GLUCOSE 139* 200* 150*  BUN 25 34* 34*  CREATININE 1.38* 1.62* 1.40*  CALCIUM 10.6* 10.8* 10.3  TSH 1.86  --   --    Liver Function Tests: Recent Labs    10/15/21 0932 01/17/22 1041 04/22/22 1128  AST _0 ALT _1 BILITOT 0.6 0.8 0.6  PROT 6.3 5.9* 6.4   No results for input(s): "LIPASE", "AMYLASE" in the last 8760 hours. No results for input(s): "AMMONIA" in the last 8760 hours. CBC: Recent Labs    10/15/21 0932 01/17/22 1041  WBC 7.2 7.6  NEUTROABS 3,852 5,024  HGB 14.9 14.0  HCT 45.9* 43.6  MCV 90.4 90.3  PLT 211 177   Lipid Panel: Recent Labs    10/15/21 0932 01/17/22 1041  CHOL 289* 168  HDL 74 57  LDLCALC 180* 83  TRIG 190* 189*  CHOLHDL 3.9 2.9   Lab Results  Component Value Date   HGBA1C 7.1 (H) 04/22/2022    Procedures since last visit: No results found.  Assessment/Plan  1. Vertigo -  declined Meclizine -  fall precautions - CBC With Differential/Platelet - BMP with eGFR([facility]) - POC Urinalysis Dipstick - Culture, Urine  2. Type 2 diabetes mellitus with stage 3b chronic kidney disease, with long-term current use of insulin (HCC) Lab Results  Component Value Date   HGBA1C 7.1 (H) 04/22/2022   - POC Glucose (CBG) 206 - continue Levemir and Tradjenta  3. Stage 3b chronic kidney disease [facility]) Lab Results  Component Value Date   NA 141 07/18/2022   K 4.2 07/18/2022   CO2 26 07/18/2022   GLUCOSE 159 (H) 07/18/2022   BUN 35 (H) 07/18/2022   CREATININE 1.31 (H) 07/18/2022   CALCIUM 10.4 07/18/2022   EGFR 38 (L) 07/18/2022   GFRNONAA 33 (L) 09/15/2020   -  stable  4. Hypothyroidism due to acquired atrophy of thyroid -  continue Levothyroxine - TSH    Labs/tests ordered:   tsh, CBG, BMP, CBC,POC urine dipstick and urine culture  Next appt:  09/30/2022

## 2022-07-19 LAB — CBC WITH DIFFERENTIAL/PLATELET
Absolute Monocytes: 790 cells/uL (ref 200–950)
Basophils Absolute: 60 cells/uL (ref 0–200)
Basophils Relative: 0.6 %
Eosinophils Absolute: 100 cells/uL (ref 15–500)
Eosinophils Relative: 1 %
HCT: 44.5 % (ref 35.0–45.0)
Hemoglobin: 14.5 g/dL (ref 11.7–15.5)
Lymphs Abs: 2340 cells/uL (ref 850–3900)
MCH: 29.7 pg (ref 27.0–33.0)
MCHC: 32.6 g/dL (ref 32.0–36.0)
MCV: 91 fL (ref 80.0–100.0)
MPV: 12.3 fL (ref 7.5–12.5)
Monocytes Relative: 7.9 %
Neutro Abs: 6710 cells/uL (ref 1500–7800)
Neutrophils Relative %: 67.1 %
Platelets: 209 10*3/uL (ref 140–400)
RBC: 4.89 10*6/uL (ref 3.80–5.10)
RDW: 12.6 % (ref 11.0–15.0)
Total Lymphocyte: 23.4 %
WBC: 10 10*3/uL (ref 3.8–10.8)

## 2022-07-19 LAB — BASIC METABOLIC PANEL WITH GFR
BUN/Creatinine Ratio: 27 (calc) — ABNORMAL HIGH (ref 6–22)
BUN: 35 mg/dL — ABNORMAL HIGH (ref 7–25)
CO2: 26 mmol/L (ref 20–32)
Calcium: 10.4 mg/dL (ref 8.6–10.4)
Chloride: 103 mmol/L (ref 98–110)
Creat: 1.31 mg/dL — ABNORMAL HIGH (ref 0.60–0.95)
Glucose, Bld: 159 mg/dL — ABNORMAL HIGH (ref 65–99)
Potassium: 4.2 mmol/L (ref 3.5–5.3)
Sodium: 141 mmol/L (ref 135–146)
eGFR: 38 mL/min/{1.73_m2} — ABNORMAL LOW (ref >=60)

## 2022-07-19 LAB — TSH: TSH: 2.2 mIU/L (ref 0.40–4.50)

## 2022-07-21 LAB — URINE CULTURE
MICRO NUMBER:: 14265853
SPECIMEN QUALITY:: ADEQUATE

## 2022-07-22 ENCOUNTER — Other Ambulatory Visit: Payer: Self-pay | Admitting: Adult Health

## 2022-07-22 MED ORDER — SULFAMETHOXAZOLE-TRIMETHOPRIM 800-160 MG PO TABS: 1.0000 | ORAL_TABLET | Freq: Two times a day (BID) | ORAL | 0 refills | Status: AC

## 2022-07-22 NOTE — Progress Notes (Signed)
Urine culture is indicative of UTI. I have ordered antibiotic at her pharmacy. -  BMP done same as the previous. -  CBC is within normal

## 2022-08-10 ENCOUNTER — Emergency Department (HOSPITAL_COMMUNITY): Payer: Medicare PPO

## 2022-08-10 ENCOUNTER — Emergency Department (HOSPITAL_COMMUNITY)
Admission: EM | Admit: 2022-08-10 | Discharge: 2022-08-11 | Discharge status: Against Medical Advice | Payer: Medicare PPO | Attending: Emergency Medicine | Admitting: Emergency Medicine

## 2022-08-10 ENCOUNTER — Encounter (HOSPITAL_COMMUNITY): Payer: Self-pay

## 2022-08-10 DIAGNOSIS — R059 Cough, unspecified: Secondary | ICD-10-CM | POA: Insufficient documentation

## 2022-08-10 DIAGNOSIS — J029 Acute pharyngitis, unspecified: Secondary | ICD-10-CM | POA: Diagnosis not present

## 2022-08-10 DIAGNOSIS — Z5321 Procedure and treatment not carried out due to patient leaving prior to being seen by health care provider: Secondary | ICD-10-CM | POA: Insufficient documentation

## 2022-08-10 DIAGNOSIS — R0902 Hypoxemia: Secondary | ICD-10-CM | POA: Diagnosis not present

## 2022-08-10 DIAGNOSIS — R0602 Shortness of breath: Secondary | ICD-10-CM | POA: Insufficient documentation

## 2022-08-10 DIAGNOSIS — Z20822 Contact with and (suspected) exposure to covid-19: Secondary | ICD-10-CM | POA: Diagnosis not present

## 2022-08-10 DIAGNOSIS — I10 Essential (primary) hypertension: Secondary | ICD-10-CM | POA: Diagnosis not present

## 2022-08-10 DIAGNOSIS — R070 Pain in throat: Secondary | ICD-10-CM | POA: Diagnosis not present

## 2022-08-10 LAB — CBC WITH DIFFERENTIAL/PLATELET
Abs Immature Granulocytes: 0.05 10*3/uL (ref 0.00–0.07)
Basophils Absolute: 0.1 10*3/uL (ref 0.0–0.1)
Basophils Relative: 1 %
Eosinophils Absolute: 0.1 10*3/uL (ref 0.0–0.5)
Eosinophils Relative: 1 %
HCT: 44.3 % (ref 36.0–46.0)
Hemoglobin: 14.6 g/dL (ref 12.0–15.0)
Immature Granulocytes: 1 %
Lymphocytes Relative: 7 %
Lymphs Abs: 0.5 10*3/uL — ABNORMAL LOW (ref 0.7–4.0)
MCH: 30.4 pg (ref 26.0–34.0)
MCHC: 33 g/dL (ref 30.0–36.0)
MCV: 92.1 fL (ref 80.0–100.0)
Monocytes Absolute: 0.6 10*3/uL (ref 0.1–1.0)
Monocytes Relative: 9 %
Neutro Abs: 5.8 10*3/uL (ref 1.7–7.7)
Neutrophils Relative %: 81 %
Platelets: 155 10*3/uL (ref 150–400)
RBC: 4.81 MIL/uL (ref 3.87–5.11)
RDW: 13.7 % (ref 11.5–15.5)
WBC: 7.1 10*3/uL (ref 4.0–10.5)
nRBC: 0 % (ref 0.0–0.2)

## 2022-08-10 LAB — BASIC METABOLIC PANEL
Anion gap: 11 (ref 5–15)
BUN: 18 mg/dL (ref 8–23)
CO2: 28 mmol/L (ref 22–32)
Calcium: 9.5 mg/dL (ref 8.9–10.3)
Chloride: 101 mmol/L (ref 98–111)
Creatinine, Ser: 1.34 mg/dL — ABNORMAL HIGH (ref 0.44–1.00)
GFR, Estimated: 37 mL/min — ABNORMAL LOW (ref >60)
Glucose, Bld: 150 mg/dL — ABNORMAL HIGH (ref 70–99)
Potassium: 3.8 mmol/L (ref 3.5–5.1)
Sodium: 140 mmol/L (ref 135–145)

## 2022-08-10 MED ORDER — ACETAMINOPHEN 325 MG PO TABS
650.0000 mg | ORAL_TABLET | Freq: Once | ORAL | Status: AC
  Administered 2022-08-10: 650 mg via ORAL
  Filled 2022-08-10: qty 2

## 2022-08-10 NOTE — ED Provider Triage Note (Signed)
Emergency Medicine Provider Triage Evaluation Note  Melissa Shannon , a 88 y.o. female  was evaluated in triage.  Pt complains of shortness of breath and cough.  Pt brought by EMS from [facility].  She also reports sore throat.  Pt was 90% on RA at UC, placed on 2L and now 94%.  Negative for COVID and flu.  Denies fever, chills, weakness, syncope, myalgias.    Review of Systems  Positive: As above Negative: As above  Physical Exam  BP (!) 154/78   Pulse 95   Temp (!) 100.4 F (38 C) (Oral)   Resp 20   SpO2 95%  Gen:   Awake, no distress   Resp:  Normal effort, rhonchi auscultated in all lung fields   MSK:   Moves extremities without difficulty  Other:    Medical Decision Making  Medically screening exam initiated at 7:42 PM.  Appropriate orders placed.  Melissa Shannon was informed that the remainder of the evaluation will be completed by another provider, this initial triage assessment does not replace that evaluation, and the importance of remaining in the ED until their evaluation is complete.     Theressa Stamps R, [place] 08/10/22 [phone]

## 2022-08-10 NOTE — ED Triage Notes (Signed)
Pt comes via [facility] for cough, sore from UC and some SOB, was 90% on RA an UC, placed on 2L and now 94% on RA, negative for covid and flu at [facility], reports dry productive cough

## 2022-08-10 NOTE — ED Notes (Signed)
Pt has a doctors appointment Friday and does not want to wait in the lobby any longer

## 2022-08-11 ENCOUNTER — Ambulatory Visit: Payer: Medicare PPO | Admitting: Adult Health

## 2022-08-11 ENCOUNTER — Encounter: Payer: Self-pay | Admitting: Adult Health

## 2022-08-11 VITALS — BP 122/68 | HR 93 | Temp 97.3°F | Ht 68.0 in | Wt 202.0 lb

## 2022-08-11 DIAGNOSIS — J189 Pneumonia, unspecified organism: Secondary | ICD-10-CM | POA: Insufficient documentation

## 2022-08-11 MED ORDER — AZITHROMYCIN 250 MG PO TABS: ORAL_TABLET | ORAL | 0 refills | Status: AC

## 2022-08-11 MED ORDER — DM-GUAIFENESIN ER 30-600 MG PO TB12: 1.0000 | ORAL_TABLET | Freq: Two times a day (BID) | ORAL | 0 refills | Status: DC | PRN

## 2022-08-11 MED ORDER — ALBUTEROL SULFATE HFA 108 (90 BASE) MCG/ACT IN AERS: 2.0000 | INHALATION_SPRAY | Freq: Four times a day (QID) | RESPIRATORY_TRACT | 0 refills | Status: DC | PRN

## 2022-08-11 NOTE — Progress Notes (Signed)
Location:  [place]   Place of Service:   [facility]    Allergies  Allergen Reactions   Lisinopril Shortness Of Breath    Tracheal Edema   Penicillins Other (See Comments)    DID THE REACTION INVOLVE: Swelling of the face/tongue/throat, SOB, or low BP? Yes Sudden or severe rash/hives, skin peeling, or the inside of the mouth or nose? No Did it require medical treatment? No When did it last happen?      more than 10 years  If all above answers are "NO", may proceed with cephalosporin use.   Throat felt tight    Chief Complaint  Patient presents with   Acute Visit    Sore throat since Tuesday. Patient was tested for covid and flu yesterday at urgent care and both were negative.     HPI:  She has had a sore throat since Tuesday. She is coughing with sputum (yellow) production. She is more short of breath. Has noted wheezing present. She has not had any known fevers. She was tested for flu and covid both negative. She did go to the ED waited for 5 hours was not able to wait any longer and went home.   Past Medical History:  Diagnosis Date   Arthritis    bilateral knees   Chronic diastolic CHF (congestive heart failure) (HCC)    CKD (chronic kidney disease), stage III (HCC)    Diabetes mellitus without complication ([place])    Does use hearing aid    Edema    Hyperlipidemia    Hypertension    Hypothyroidism    Pulmonary embolism ([place])    Urinary incontinence     Past Surgical History:  Procedure Laterality Date   BLADDER SURGERY  2000   Dr.Tannerbaum (Bladder Tact)   CATARACT EXTRACTION  03/2013   Dr.Shapiro   CHOLECYSTECTOMY     EYE SURGERY     bilateral cataracts   [facility]    right elbow  1999   x 2, still has one piece of metal in it   [place]   Dr.Brewer    Social History   Socioeconomic History   Marital status: Widowed    Spouse name: Not on file   Number of children: Not on  file   Years of education: Not on file   Highest education level: Not on file  Occupational History   Occupation: Retired Pharmacist, hospital  Tobacco Use   Smoking status: Never   Smokeless tobacco: Never  Vaping Use   Vaping Use: Never used  Substance and Sexual Activity   Alcohol use: No   Drug use: No   Sexual activity: Never  Other Topics Concern   Not on file  Social History Narrative   Diet: Low sodium    Do you drink/eat things with caffeine? Yes, tea   Widowed, married 1952   Lives in a house, yes- 1 or more stories, 1 person in home. No pets    Current/past profession- Teacher    Patient exercises with walker twice daily        Social Determinants of Health   Financial Resource Strain: Low Risk  (02/23/2018)   Overall Financial Resource Strain (CARDIA)    Difficulty of Paying Living Expenses: Not hard at all  Food Insecurity: No Food Insecurity (06/09/2022)   Hunger Vital Sign    Worried About Running Out of Food in the Last Year: Never true  Ran Out of Food in the Last Year: Never true  Transportation Needs: No Transportation Needs (06/09/2022)   PRAPARE - Hydrologist (Medical): No    Lack of Transportation (Non-Medical): No  Physical Activity: Insufficiently Active (02/23/2018)   Exercise Vital Sign    Days of Exercise per Week: 7 days    Minutes of Exercise per Session: 20 min  Stress: No Stress Concern Present (02/23/2018)   [place]    Feeling of Stress : Not at all  Social Connections: Moderately Isolated (02/23/2018)   Social Connection and Isolation Panel [NHANES]    Frequency of Communication with Friends and Family: Three times a week    Frequency of Social Gatherings with Friends and Family: Once a week    Attends Religious Services: Never    Marine scientist or Organizations: No    Attends Archivist Meetings: Never    Marital Status: Widowed   Intimate Partner Violence: Not At Risk (02/23/2018)   Humiliation, Afraid, Rape, and Kick questionnaire    Fear of Current or Ex-Partner: No    Emotionally Abused: No    Physically Abused: No    Sexually Abused: No   Family History  Problem Relation Age of Onset   Suicidality Father    Cancer Brother    Cancer Maternal Grandfather    Diabetes Maternal Grandmother    Melanoma Brother       VITAL SIGNS BP 122/68 (BP Location: Right Arm, Patient Position: Sitting, Cuff Size: Large)   Pulse 93   Temp (!) 97.3 F (36.3 C) (Temporal)   Ht '5\' 8"'$  (1.727 m)   Wt 202 lb (91.6 kg)   SpO2 94%   BMI 30.71 kg/m   Outpatient Encounter Medications as of 08/11/2022  Medication Sig   ACCU-CHEK GUIDE test strip USE DAILY TO TEST BLOOD SUGAR (DX:E11.22)   Accu-Chek Softclix Lancets lancets USE EVERY OTHER DAY TO TEST BLOOD SUGAR   amLODipine (NORVASC) 5 MG tablet TAKE 1 TABLET BY MOUTH EVERY DAY   apixaban (ELIQUIS) 5 MG TABS tablet TAKE 1 TABLET BY MOUTH TWICE A DAY   Cholecalciferol (VITAMIN D3) 5000 UNITS CHEW Chew 5,000 Units by mouth daily.    donepezil (ARICEPT) 10 MG tablet TAKE 1 TABLET BY MOUTH EVERY DAY AT BEDTIME TO PRESERVE MEMORY   furosemide (LASIX) 20 MG tablet TAKE 1 TABLET BY MOUTH EVERY DAY   insulin detemir (LEVEMIR FLEXTOUCH) 100 UNIT/ML FlexPen Inject 7 Units into the skin daily.   Insulin Pen Needle (BD PEN NEEDLE NANO 2ND GEN) 32G X 4 MM MISC USE EVERY DAY   levothyroxine (SYNTHROID) 88 MCG tablet TAKE 1 TABLET BY MOUTH EVERY DAY BEFORE BREAKFAST   Multiple Vitamin (MULTIVITAMIN WITH MINERALS) TABS tablet Take 1 tablet by mouth daily.   Nutritional Supplements (GLUCERNA SHAKE PO) Take by mouth.   rosuvastatin (CRESTOR) 10 MG tablet Take 1 tablet (10 mg total) by mouth daily.   TRADJENTA 5 MG TABS tablet TAKE 1 TABLET BY MOUTH EVERY DAY   No facility-administered encounter medications on file as of 08/11/2022.     SIGNIFICANT DIAGNOSTIC EXAMS  Review of Systems   Constitutional:  Negative for malaise/fatigue.  HENT:  Positive for sore throat.   Respiratory:  Positive for cough, sputum production and shortness of breath.   Cardiovascular:  Positive for leg swelling. Negative for chest pain and palpitations.  Gastrointestinal:  Negative for abdominal pain, constipation  and heartburn.  Musculoskeletal:  Negative for back pain, joint pain and myalgias.  Skin: Negative.   Neurological:  Negative for dizziness.  Psychiatric/Behavioral:  The patient is not nervous/anxious.    Physical Exam Constitutional:      General: She is not in acute distress.    Appearance: She is well-developed. She is obese. She is not diaphoretic.  HENT:     Right Ear: Ear canal and external ear normal.     Left Ear: Ear canal and external ear normal.     Mouth/Throat:     Comments: Throat is red without patches present; no PND present  Eyes:     Conjunctiva/sclera: Conjunctivae normal.  Neck:     Thyroid: No thyromegaly.  Cardiovascular:     Rate and Rhythm: Normal rate and regular rhythm.     Pulses: Normal pulses.     Heart sounds: Normal heart sounds.  Pulmonary:     Effort: Pulmonary effort is normal. No respiratory distress.     Breath sounds: Wheezing and rhonchi present.  Abdominal:     General: Bowel sounds are normal. There is no distension.     Palpations: Abdomen is soft.     Tenderness: There is no abdominal tenderness.  Musculoskeletal:        General: Normal range of motion.     Cervical back: Neck supple. No tenderness.     Right lower leg: Edema present.     Left lower leg: Edema present.  Lymphadenopathy:     Cervical: No cervical adenopathy.  Skin:    General: Skin is warm and dry.  Neurological:     Mental Status: She is alert and oriented to person, place, and time.  Psychiatric:        Mood and Affect: Mood normal.         ASSESSMENT/ PLAN:  TODAY  Community acquired pneumonia unspecified laterality : will begin zpack to take  as directed; albuterol inhaler 2 puffs every 6 hours as needed mucinex DM twice daily as needed. She has been instructed to call office if not better in the next 10 days or if she runs a fever    Ok Edwards NP [facility]   call [phone]

## 2022-08-11 NOTE — Patient Instructions (Signed)
Take zpack as directed Take mucinex dm twice daily as needed Take albuterol inhaler 2 puffs every 6 hours as needed Call office in 2 weeks if not better or run a fever >100.0 degrees

## 2022-08-31 ENCOUNTER — Ambulatory Visit (HOSPITAL_COMMUNITY)
Admission: RE | Admit: 2022-08-31 | Discharge: 2022-08-31 | Disposition: A | Discharge status: Home / Self Care | Payer: Medicare PPO | Source: Ambulatory Visit | Attending: Cardiology | Admitting: Cardiology

## 2022-08-31 ENCOUNTER — Encounter (HOSPITAL_COMMUNITY): Payer: Self-pay | Admitting: Cardiology

## 2022-08-31 VITALS — BP 110/60 | HR 85 | Wt 202.2 lb

## 2022-08-31 DIAGNOSIS — Z7984 Long term (current) use of oral hypoglycemic drugs: Secondary | ICD-10-CM | POA: Insufficient documentation

## 2022-08-31 DIAGNOSIS — Z7901 Long term (current) use of anticoagulants: Secondary | ICD-10-CM | POA: Insufficient documentation

## 2022-08-31 DIAGNOSIS — R9431 Abnormal electrocardiogram [ECG] [EKG]: Secondary | ICD-10-CM | POA: Diagnosis not present

## 2022-08-31 DIAGNOSIS — E039 Hypothyroidism, unspecified: Secondary | ICD-10-CM | POA: Insufficient documentation

## 2022-08-31 DIAGNOSIS — R609 Edema, unspecified: Secondary | ICD-10-CM | POA: Insufficient documentation

## 2022-08-31 DIAGNOSIS — N183 Chronic kidney disease, stage 3 unspecified: Secondary | ICD-10-CM | POA: Diagnosis not present

## 2022-08-31 DIAGNOSIS — E785 Hyperlipidemia, unspecified: Secondary | ICD-10-CM | POA: Diagnosis not present

## 2022-08-31 DIAGNOSIS — R0602 Shortness of breath: Secondary | ICD-10-CM | POA: Insufficient documentation

## 2022-08-31 DIAGNOSIS — Z79890 Hormone replacement therapy (postmenopausal): Secondary | ICD-10-CM | POA: Diagnosis not present

## 2022-08-31 DIAGNOSIS — Z794 Long term (current) use of insulin: Secondary | ICD-10-CM | POA: Diagnosis not present

## 2022-08-31 DIAGNOSIS — Z79899 Other long term (current) drug therapy: Secondary | ICD-10-CM | POA: Diagnosis not present

## 2022-08-31 DIAGNOSIS — E1122 Type 2 diabetes mellitus with diabetic chronic kidney disease: Secondary | ICD-10-CM | POA: Insufficient documentation

## 2022-08-31 DIAGNOSIS — M25569 Pain in unspecified knee: Secondary | ICD-10-CM | POA: Diagnosis not present

## 2022-08-31 DIAGNOSIS — I50810 Right heart failure, unspecified: Secondary | ICD-10-CM | POA: Diagnosis not present

## 2022-08-31 DIAGNOSIS — I130 Hypertensive heart and chronic kidney disease with heart failure and stage 1 through stage 4 chronic kidney disease, or unspecified chronic kidney disease: Secondary | ICD-10-CM | POA: Insufficient documentation

## 2022-08-31 DIAGNOSIS — I5032 Chronic diastolic (congestive) heart failure: Secondary | ICD-10-CM | POA: Diagnosis not present

## 2022-08-31 LAB — CBC
HCT: 42.5 % (ref 36.0–46.0)
Hemoglobin: 13.8 g/dL (ref 12.0–15.0)
MCH: 29.6 pg (ref 26.0–34.0)
MCHC: 32.5 g/dL (ref 30.0–36.0)
MCV: 91 fL (ref 80.0–100.0)
Platelets: 216 10*3/uL (ref 150–400)
RBC: 4.67 MIL/uL (ref 3.87–5.11)
RDW: 13.9 % (ref 11.5–15.5)
WBC: 8.5 10*3/uL (ref 4.0–10.5)
nRBC: 0 % (ref 0.0–0.2)

## 2022-08-31 LAB — BASIC METABOLIC PANEL
Anion gap: 8 (ref 5–15)
BUN: 34 mg/dL — ABNORMAL HIGH (ref 8–23)
CO2: 28 mmol/L (ref 22–32)
Calcium: 10.4 mg/dL — ABNORMAL HIGH (ref 8.9–10.3)
Chloride: 105 mmol/L (ref 98–111)
Creatinine, Ser: 1.18 mg/dL — ABNORMAL HIGH (ref 0.44–1.00)
GFR, Estimated: 43 mL/min — ABNORMAL LOW (ref >60)
Glucose, Bld: 174 mg/dL — ABNORMAL HIGH (ref 70–99)
Potassium: 4 mmol/L (ref 3.5–5.1)
Sodium: 141 mmol/L (ref 135–145)

## 2022-08-31 LAB — BRAIN NATRIURETIC PEPTIDE: B Natriuretic Peptide: 47 pg/mL (ref 0.0–100.0)

## 2022-08-31 NOTE — Progress Notes (Signed)
Patient ID: Melissa Shannon, female   DOB: 1928/07/27, 89 y.o.   MRN: [id number] PCP: Dr. Bubba Camp Cardiology: Dr. Aundra Dubin  89 y.o. with history of HTN, DM, and immobility due to osteoarthritis presents for followup of right heart failure and PE.  At baseline, patient is not very active.  She has knee arthritis that is very limiting.  She does not do much walking and uses a walker to get around.  She has been very limited for a few years now.  She was admitted in 10/15 with dyspnea.  The dyspnea began about 2-3 days prior to admission.  She would get short of breath after walking about 20 feet with her walker.  She was noted to be in CHF at admission with troponin elevated to 0.6.  Echo showed normal LV size and systolic function but severely dilated/dysfunctional RV with pulmonary hypertension.  V/Q scan was done and showed intermediate probability for PE.  She was seen by pulmonary and thought to have a PE.  Apixaban was started.  She was diuresed with IV Lasix.  Repeat echo was done 3/16.  This showed EF 55-60%, normal RV size and systolic function, and PA systolic pressure 28 mmHg.  V/Q scan in 5/16 was normal.   She was admitted in 4/16 with altered mental status, likely due to E coli urosepsis.   She was admitted in 1/20 after getting tracheal edema likely as an ACEI reaction.  Echo in 1/20 showed EF 55-60% with mild AS and normal RV size and systolic function.  Echo in 1/21 showed EF 55-60%, mild-moderate AS with mean gradient 13 and AVA 1.3 cm^2, mild MR.  Echo in 2/22 showed EF 55-60%, mild AS, normal RV, PASP 19, IVC normal.   Patient had PNA in 12/23 and was symptomatic for a prolonged period but has now recovered.  She is mostly confined to the wheelchair but walks around her house with a walker. She is limited by knee pain.  She is mildly short of breath walking with her walker.  No chest pain.  No lightheadedness.  No orthopnea/PND.    ECG (personally reviewed): NSR, anteroseptal Qs  Labs  (06/18/14): K 4.4, creatinine 1.44, HCT 35.9, BNP 10167 => 1415 Labs (09/01/2014): K 4.2 Creatinine 1.23  Labs (2/16): K 4.3, creatinine 1.62, HCT 40.7 Labs (7/16): K 5.2, creatinine 1.66 Labs (6/17): creatinine 1.25, LDL 64 Labs (7/17): HCT 43.5 Labs (10/17): K 4.9, creatinine 1.44, BNP 25 Labs (2/18): K 4.6, creatinine 1.27 Labs (7/19): LDL 77, pro-BNP 34, hgb 14.2  Labs (11/20): LDL 76, K 3.8, creatinine 1.18 Labs (11/21): LDL 55, HDL 65, K 4.1, creatinine 1.49 Labs (6/23): LDL 83, HDL 57 Labs (12/23): K 3.8, creatinine 1.34  PMH: 1. HTN - tracheal edema with ACEI.  2. Hyperlipidemia 3. Osteoarthritis: Involving knees, uses walker.  4. Type II diabetes 5. H/o CCY 6. CKD stage III 7. Hypothyroidism 8. PE: Admitted 10/15 with dyspnea/CHF.  Echo (10/15) with EF 60-65%, D-shaped interventricular septum, severely dilated RV with moderately decreased RV systolic function, PA systolic pressure 47 mmHg.  V/Q scan (10/15) was intermediate probability for PE. Patient was started on apixaban.  Echo (3/16) with EF 55-60%, basal inferior hypokinesis, normal RV size and systolic function, PA systolic pressure 28 mmHg. V/Q scan 5/16 was normal.  - Echo (1/20): EF 55-60%, mild AS, normal RV size and systolic function.  - Echo (1/21): EF 55-60%, mild-moderate AS with mean gradient 13 and AVA 1.3 cm^2, mild MR. - Echo (  2/22): EF 55-60%, mild AS, normal RV, PASP 19, IVC normal. 9. Nodules on CT chest 10/15: Per Dr Melvyn Novas, likely benign findings.  10. GERD 11. Aortic stenosis: Mild on 1/20 echo (mean gradient 13).  Mild-moderate on 1/21 echo. Mild on 2/22 echo.   FH: No h/o venous thromboembolism, no heart problems that she knows of.   SH: Widow, lives alone, nonsmoker, 3 kids.   ROS: All systems reviewed and negative except as per HPI.   Current Outpatient Medications  Medication Sig Dispense Refill   ACCU-CHEK GUIDE test strip USE DAILY TO TEST BLOOD SUGAR (DX:E11.22) 100 strip 11   Accu-Chek  Softclix Lancets lancets USE EVERY OTHER DAY TO TEST BLOOD SUGAR 100 each 3   amLODipine (NORVASC) 5 MG tablet TAKE 1 TABLET BY MOUTH EVERY DAY 90 tablet 2   apixaban (ELIQUIS) 5 MG TABS tablet TAKE 1 TABLET BY MOUTH TWICE A DAY 180 tablet 3   Cholecalciferol (VITAMIN D3) 5000 UNITS CHEW Chew 5,000 Units by mouth daily.      donepezil (ARICEPT) 10 MG tablet TAKE 1 TABLET BY MOUTH EVERY DAY AT BEDTIME TO PRESERVE MEMORY 90 tablet 1   furosemide (LASIX) 20 MG tablet TAKE 1 TABLET BY MOUTH EVERY DAY 90 tablet 1   insulin detemir (LEVEMIR) 100 UNIT/ML injection Inject 24 Units into the skin daily.     Insulin Pen Needle (BD PEN NEEDLE NANO 2ND GEN) 32G X 4 MM MISC USE EVERY DAY 100 each 11   levothyroxine (SYNTHROID) 88 MCG tablet TAKE 1 TABLET BY MOUTH EVERY DAY BEFORE BREAKFAST 90 tablet 3   Multiple Vitamin (MULTIVITAMIN WITH MINERALS) TABS tablet Take 1 tablet by mouth daily. 30 tablet 0   Nutritional Supplements (GLUCERNA SHAKE PO) Take by mouth.     rosuvastatin (CRESTOR) 10 MG tablet Take 1 tablet (10 mg total) by mouth daily. 90 tablet 3   TRADJENTA 5 MG TABS tablet TAKE 1 TABLET BY MOUTH EVERY DAY 90 tablet 1   No current facility-administered medications for this encounter.   BP 110/60   Pulse 85   Wt 91.7 kg (202 lb 3.2 oz)   SpO2 95%   BMI 30.74 kg/m   General: NAD Neck: No JVD, no thyromegaly or thyroid nodule.  Lungs: Clear to auscultation bilaterally with normal respiratory effort. CV: Nondisplaced PMI.  Heart regular S1/S2, no S3/S4, 1/6 SEM RUSB.  No peripheral edema.  No carotid bruit.  Normal pedal pulses.  Abdomen: Soft, nontender, no hepatosplenomegaly, no distention.  Skin: Intact without lesions or rashes.  Neurologic: Alert and oriented x 3.  Psych: Normal affect. Extremities: No clubbing or cyanosis. Lymphedema of lower legs.  HEENT: Normal.    Assessment/Plan 1. Right heart failure: Echo on 10/15 showed dilated RV with decreased systolic function.  V/Q scan  with suspected PE. 1/20 echo showed normal RV size and systolic function with normal estimate PA systolic pressure.  5/16 V/Q scan showed no PE.  Most recent echo in 1/21 showed normal RV function by report.  She is not volume overloaded by exam, NYHA class III symptoms probably but suspect obesity and deconditioning play a major role in her dyspnea.  - Check BNP.  - Continue current Lasix regimen, she is taking 20 mg daily.  BMET today.  - Repeat echo to follow AS and RV.  2. PE: V/Q scan intermediate probability for PE in 10/15.  Pulmonary saw and suspect that she did indeed have PE.  The PE was spontaneous.  She  is at risk given general immobility. No bleeding problems.  She did not have evidence for chronic PE on 5/16 V/Q scan.   - I would recommend that she continue apixaban long-term to decrease risk as she will likely remain fairly immobile long-term due to her arthritis. CBC today.   3. HTN: BP reasonably controlled.    4. Hyperlipidemia: Continue Crestor, LDL ok in 6/23.  5. Aortic stenosis: Mild on last echo.  - I will arrange for repeat echo to follow.    Followup in 6 months with APP.   Loralie Champagne 08/31/2022

## 2022-08-31 NOTE — Patient Instructions (Signed)
There has been no changes to your medications.  Labs done today, your results will be available in MyChart, we will contact you for abnormal readings.  Your physician has requested that you have an echocardiogram. Echocardiography is a painless test that uses sound waves to create images of your heart. It provides your doctor with information about the size and shape of your heart and how well your heart's chambers and valves are working. This procedure takes approximately one hour. There are no restrictions for this procedure. Please do NOT wear cologne, perfume, aftershave, or lotions (deodorant is allowed). Please arrive 15 minutes prior to your appointment time.  Your physician recommends that you schedule a follow-up appointment in: 6 months (July 2024)  ** please call the office in May to arrange your follow up appointment. **  If you have any questions or concerns before your next appointment please send [place] a message through [place] or call our office at [phone].    TO LEAVE A MESSAGE FOR THE NURSE SELECT OPTION 2, PLEASE LEAVE A MESSAGE INCLUDING: YOUR NAME DATE OF BIRTH CALL BACK NUMBER REASON FOR CALL**this is important as we prioritize the call backs  YOU WILL RECEIVE A CALL BACK THE SAME DAY AS LONG AS YOU CALL BEFORE 4:00 PM  At the [facility], you and your health needs are our priority. As part of our continuing mission to provide you with exceptional heart care, we have created designated Provider Care Teams. These Care Teams include your primary Cardiologist (physician) and Advanced Practice Providers (APPs- Physician Assistants and Nurse Practitioners) who all work together to provide you with the care you need, when you need it.   You may see any of the following providers on your designated Care Team at your next follow up: Dr Glori Bickers Dr Loralie Champagne Dr. Roxana Hires, NP Lyda Jester, [place] [facility], [place] Forestine Na, NP Audry Riles, PharmD   Please be sure to bring in all your medications bottles to every appointment.

## 2022-09-05 ENCOUNTER — Telehealth: Payer: Self-pay | Admitting: *Deleted

## 2022-09-13 ENCOUNTER — Telehealth: Payer: Self-pay | Admitting: *Deleted

## 2022-09-13 NOTE — Telephone Encounter (Signed)
Received fax from [facility] stating that a previous request for Prolia was submitted and APPROVED for the following dates: 08/15/21 to 08/14/22.   Authorization #: [id number]  In order to improve continuity of care [employer]  [phone] is automatically extending the authorizations for certain medications provided in doctor's offices.  Prolia request has been automatically APPROVED and extended for the next calendar year. No need to submit a new PA request.

## 2022-09-27 DIAGNOSIS — Z85828 Personal history of other malignant neoplasm of skin: Secondary | ICD-10-CM | POA: Diagnosis not present

## 2022-09-27 DIAGNOSIS — L821 Other seborrheic keratosis: Secondary | ICD-10-CM | POA: Diagnosis not present

## 2022-09-27 DIAGNOSIS — L720 Epidermal cyst: Secondary | ICD-10-CM | POA: Diagnosis not present

## 2022-09-27 DIAGNOSIS — D225 Melanocytic nevi of trunk: Secondary | ICD-10-CM | POA: Diagnosis not present

## 2022-09-27 DIAGNOSIS — L723 Sebaceous cyst: Secondary | ICD-10-CM | POA: Diagnosis not present

## 2022-09-30 ENCOUNTER — Ambulatory Visit: Payer: Medicare PPO

## 2022-09-30 ENCOUNTER — Ambulatory Visit: Payer: Medicare PPO | Admitting: Family

## 2022-09-30 ENCOUNTER — Encounter: Payer: Self-pay | Admitting: Family

## 2022-09-30 VITALS — BP 130/78 | HR 79 | Temp 97.2°F | Resp 18 | Ht 68.0 in | Wt 202.2 lb

## 2022-09-30 DIAGNOSIS — M810 Age-related osteoporosis without current pathological fracture: Secondary | ICD-10-CM

## 2022-09-30 DIAGNOSIS — I5032 Chronic diastolic (congestive) heart failure: Secondary | ICD-10-CM | POA: Diagnosis not present

## 2022-09-30 MED ORDER — DENOSUMAB 60 MG/ML ~~LOC~~ SOSY
60.0000 mg | PREFILLED_SYRINGE | Freq: Once | SUBCUTANEOUS | Status: AC
  Administered 2022-09-30: 60 mg via SUBCUTANEOUS

## 2022-09-30 NOTE — Progress Notes (Signed)
Provider: Keziah Drotar FNP-C  Lauree Chandler, NP  Patient Care Team: Lauree Chandler, NP as PCP - General (Nurse Practitioner) Larey Dresser, MD as PCP - Advanced Heart Failure (Cardiology) Tanda Rockers, MD as Consulting Physician (Pulmonary Disease) Larey Dresser, MD as Consulting Physician (Cardiology)  Extended Emergency Contact Information Primary Emergency Contact: [facility] Address: [place], [place] [address] [name] of [place] Phone: [phone] Mobile Phone: [phone] Relation: Son Secondary Emergency Contact: Rober Minion [place] of [place] Phone: [phone] Mobile Phone: [phone] Relation: Daughter  Code Status:  DNR Goals of care: Advanced Directive information    08/10/2022    7:41 PM  Advanced Directives  Does Patient Have a Medical Advance Directive? No     Chief Complaint  Patient presents with   Acute Visit    Patient complains of not feeling well. Patient had virus around christmas time and wanted to be recheck. Patient's caregiver states that she has noticed more swelling in the patient's leg and more fatigue. Patient is also due for Prolia injection today.    HPI:  Pt is a 89 y.o. female seen today for an acute visit for not feeling well.states was really here for her 6 months Prolia injection but thought might as well get checked out.states since she had the virus in December has not really felt well. She denies any fever,chills,cough,fatigue,body aches,runny nose,chest tightness,chest pain,palpitation or shortness of breath. Also states legs still swollen.Has a significant history of congestive heart failure.states does not wear compression stockings.Has had no weight gain.Previous weight was 202 lbs today's weight 202.2 lbs.    Past Medical History:  Diagnosis Date   Arthritis    bilateral knees   Chronic diastolic CHF (congestive heart failure) (HCC)    CKD (chronic kidney  disease), stage III (HCC)    Diabetes mellitus without complication ([place])    Does use hearing aid    Edema    Hyperlipidemia    Hypertension    Hypothyroidism    Pulmonary embolism ([place])    Urinary incontinence    Past Surgical History:  Procedure Laterality Date   BLADDER SURGERY  2000   Dr.Tannerbaum (Bladder Tact)   CATARACT EXTRACTION  03/2013   Dr.Shapiro   CHOLECYSTECTOMY     EYE SURGERY     bilateral cataracts   [facility]    right elbow  1999   x 2, still has one piece of metal in it   TONSILLECTOMY AND ADENOIDECTOMY  1937   Dr.Brewer    Allergies  Allergen Reactions   Lisinopril Shortness Of Breath    Tracheal Edema   Penicillins Other (See Comments)    DID THE REACTION INVOLVE: Swelling of the face/tongue/throat, SOB, or low BP? Yes Sudden or severe rash/hives, skin peeling, or the inside of the mouth or nose? No Did it require medical treatment? No When did it last happen?      more than 10 years  If all above answers are "NO", may proceed with cephalosporin use.   Throat felt tight    Outpatient Encounter Medications as of 09/30/2022  Medication Sig   ACCU-CHEK GUIDE test strip USE DAILY TO TEST BLOOD SUGAR (DX:E11.22)   Accu-Chek Softclix Lancets lancets USE EVERY OTHER DAY TO TEST BLOOD SUGAR   amLODipine (NORVASC) 5 MG tablet TAKE 1 TABLET BY MOUTH EVERY DAY   apixaban (  ELIQUIS) 5 MG TABS tablet TAKE 1 TABLET BY MOUTH TWICE A DAY   Cholecalciferol (VITAMIN D3) 5000 UNITS CHEW Chew 5,000 Units by mouth daily.    donepezil (ARICEPT) 10 MG tablet TAKE 1 TABLET BY MOUTH EVERY DAY AT BEDTIME TO PRESERVE MEMORY   furosemide (LASIX) 20 MG tablet TAKE 1 TABLET BY MOUTH EVERY DAY   insulin detemir (LEVEMIR) 100 UNIT/ML injection Inject 24 Units into the skin daily.   Insulin Pen Needle (BD PEN NEEDLE NANO 2ND GEN) 32G X 4 MM MISC USE EVERY DAY   levothyroxine (SYNTHROID) 88 MCG tablet TAKE 1 TABLET BY MOUTH EVERY DAY BEFORE  BREAKFAST   Multiple Vitamin (MULTIVITAMIN WITH MINERALS) TABS tablet Take 1 tablet by mouth daily.   Nutritional Supplements (GLUCERNA SHAKE PO) Take by mouth.   rosuvastatin (CRESTOR) 10 MG tablet Take 1 tablet (10 mg total) by mouth daily.   TRADJENTA 5 MG TABS tablet TAKE 1 TABLET BY MOUTH EVERY DAY   Facility-Administered Encounter Medications as of 09/30/2022  Medication   denosumab (PROLIA) injection 60 mg    Review of Systems  Constitutional:  Negative for appetite change, chills, fatigue, fever and unexpected weight change.  HENT:  Positive for hearing loss. Negative for congestion, dental problem, ear discharge, ear pain, facial swelling, postnasal drip, rhinorrhea, sinus pressure, sinus pain, sneezing, sore throat, tinnitus and trouble swallowing.   Eyes:  Negative for pain, discharge, redness, itching and visual disturbance.  Respiratory:  Negative for cough, chest tightness, shortness of breath and wheezing.   Cardiovascular:  Positive for leg swelling. Negative for chest pain and palpitations.  Gastrointestinal:  Negative for abdominal distention, abdominal pain, blood in stool, constipation, diarrhea, nausea and vomiting.  Genitourinary:  Negative for difficulty urinating, dysuria, flank pain, frequency and urgency.  Musculoskeletal:  Positive for gait problem. Negative for arthralgias, back pain, joint swelling, myalgias, neck pain and neck stiffness.  Skin:  Negative for color change, pallor and rash.  Neurological:  Negative for dizziness, speech difficulty, weakness, light-headedness and headaches.  Hematological:  Does not bruise/bleed easily.  Psychiatric/Behavioral:  Negative for agitation, behavioral problems, confusion, hallucinations and sleep disturbance. The patient is not nervous/anxious.     Immunization History  Administered Date(s) Administered   PFIZER(Purple Top)SARS-COV-2 Vaccination 09/21/2019, 10/14/2019, 06/03/2020   Pneumococcal Polysaccharide-23  04/06/2009   Td 04/06/2009   Pertinent  Health Maintenance Due  Topic Date Due   OPHTHALMOLOGY EXAM  07/15/2022   FOOT EXAM  09/10/2022   INFLUENZA VACCINE  08/15/2048 (Originally 03/15/2022)   HEMOGLOBIN A1C  10/21/2022   DEXA SCAN  Completed      01/17/2022   10:07 AM 03/17/2022   12:54 PM 04/22/2022   10:29 AM 08/10/2022    7:41 PM 09/30/2022   10:41 AM  Fall Risk  Falls in the past year? 0 0 0  0  Was there an injury with Fall? 0 0 0  0  Fall Risk Category Calculator 0 0 0  0  Fall Risk Category (Retired) Low Low Low    (RETIRED) Patient Fall Risk Level Low fall risk Low fall risk Low fall risk Moderate fall risk   Patient at Risk for Falls Due to No Fall Risks No Fall Risks No Fall Risks  No Fall Risks  Fall risk Follow up Falls evaluation completed Falls evaluation completed Falls evaluation completed  Falls evaluation completed   Functional Status Survey:    Vitals:   09/30/22 1043  BP: 130/78  Pulse: 79  Resp:  18  Temp: (!) 97.2 F (36.2 C)  SpO2: 93%  Weight: 202 lb 3.2 oz (91.7 kg)  Height: 5' 8"$  (1.727 m)   Body mass index is 30.74 kg/m. Physical Exam Vitals reviewed.  Constitutional:      General: She is not in acute distress.    Appearance: Normal appearance. She is obese. She is not ill-appearing or diaphoretic.  HENT:     Head: Normocephalic.     Right Ear: Tympanic membrane, ear canal and external ear normal. There is no impacted cerumen.     Left Ear: Tympanic membrane, ear canal and external ear normal. There is no impacted cerumen.     Ears:     Comments: Bilateral Hearing aids in place     Nose: Nose normal. No congestion or rhinorrhea.     Mouth/Throat:     Mouth: Mucous membranes are moist.     Pharynx: Oropharynx is clear. No oropharyngeal exudate or posterior oropharyngeal erythema.  Eyes:     General: No scleral icterus.       Right eye: No discharge.        Left eye: No discharge.     Conjunctiva/sclera: Conjunctivae normal.     Pupils:  Pupils are equal, round, and reactive to light.  Neck:     Vascular: No carotid bruit.  Cardiovascular:     Rate and Rhythm: Normal rate and regular rhythm.     Pulses: Normal pulses.     Heart sounds: Normal heart sounds. No murmur heard.    No friction rub. No gallop.  Pulmonary:     Effort: Pulmonary effort is normal. No respiratory distress.     Breath sounds: Normal breath sounds. No wheezing, rhonchi or rales.  Chest:     Chest wall: No tenderness.  Abdominal:     General: Bowel sounds are normal. There is no distension.     Palpations: Abdomen is soft. There is no mass.     Tenderness: There is no abdominal tenderness. There is no right CVA tenderness, left CVA tenderness, guarding or rebound.  Musculoskeletal:        General: No swelling or tenderness. Normal range of motion.     Cervical back: Normal range of motion. No rigidity or tenderness.     Right lower leg: No edema.     Left lower leg: No edema.  Lymphadenopathy:     Cervical: No cervical adenopathy.  Skin:    General: Skin is warm and dry.     Coloration: Skin is not pale.     Findings: No bruising, erythema, lesion or rash.  Neurological:     Mental Status: She is alert and oriented to person, place, and time.     Cranial Nerves: No cranial nerve deficit.     Sensory: No sensory deficit.     Motor: No weakness.     Coordination: Coordination normal.     Gait: Gait abnormal.  Psychiatric:        Mood and Affect: Mood normal.        Speech: Speech normal.        Behavior: Behavior normal.     Labs reviewed: Recent Labs    07/18/22 1105 08/10/22 1950 08/31/22 1135  NA 141 140 141  K 4.2 3.8 4.0  CL 103 101 105  CO2 26 28 28  $ GLUCOSE 159* 150* 174*  BUN 35* 18 34*  CREATININE 1.31* 1.34* 1.18*  CALCIUM 10.4 9.5 10.4*   Recent Labs  10/15/21 0932 01/17/22 1041 04/22/22 1128  AST 14 22 16  $ ALT 10 24 22  $ BILITOT 0.6 0.8 0.6  PROT 6.3 5.9* 6.4   Recent Labs    01/17/22 1041  07/18/22 1105 08/10/22 1950 08/31/22 1135  WBC 7.6 10.0 7.1 8.5  NEUTROABS 5,024 6,710 5.8  --   HGB 14.0 14.5 14.6 13.8  HCT 43.6 44.5 44.3 42.5  MCV 90.3 91.0 92.1 91.0  PLT 177 209 155 216   Lab Results  Component Value Date   TSH 2.20 07/18/2022   Lab Results  Component Value Date   HGBA1C 7.1 (H) 04/22/2022   Lab Results  Component Value Date   CHOL 168 01/17/2022   HDL 57 01/17/2022   LDLCALC 83 01/17/2022   TRIG 189 (H) 01/17/2022   CHOLHDL 2.9 01/17/2022    Significant Diagnostic Results in last 30 days:  No results found.  Assessment/Plan 1. Osteoporosis, unspecified osteoporosis type, unspecified pathological fracture presence previous bone density reviewed done Right Femur Neck T-score -2.7 will receive her Prolia injection today.  - denosumab (PROLIA) injection 60 mg  2. Chronic diastolic CHF (congestive heart failure) (HCC) No signs of fluid overload  Weight stable compared to previous - Keep legs elevated when seated to keep swelling down  - check weight at least three times per week and notify provider for any abrupt weight gain > 3 lbs  - Reduce salt intake in diet   Family/ staff Communication: Reviewed plan of care with patient verbalized understanding  Labs/tests ordered: None   Next Appointment: Return in about 6 months (around 03/31/2023) for Prolia injection .   Sandrea Hughs, NP

## 2022-10-03 ENCOUNTER — Ambulatory Visit (HOSPITAL_COMMUNITY)
Admission: RE | Admit: 2022-10-03 | Discharge: 2022-10-03 | Disposition: A | Discharge status: Home / Self Care | Payer: Medicare PPO | Source: Ambulatory Visit | Attending: Nurse Practitioner | Admitting: Nurse Practitioner

## 2022-10-03 DIAGNOSIS — I5032 Chronic diastolic (congestive) heart failure: Secondary | ICD-10-CM | POA: Diagnosis not present

## 2022-10-03 DIAGNOSIS — E119 Type 2 diabetes mellitus without complications: Secondary | ICD-10-CM | POA: Insufficient documentation

## 2022-10-03 DIAGNOSIS — I110 Hypertensive heart disease with heart failure: Secondary | ICD-10-CM | POA: Diagnosis not present

## 2022-10-03 DIAGNOSIS — I350 Nonrheumatic aortic (valve) stenosis: Secondary | ICD-10-CM | POA: Diagnosis not present

## 2022-10-03 DIAGNOSIS — I081 Rheumatic disorders of both mitral and tricuspid valves: Secondary | ICD-10-CM | POA: Insufficient documentation

## 2022-10-03 DIAGNOSIS — E785 Hyperlipidemia, unspecified: Secondary | ICD-10-CM | POA: Insufficient documentation

## 2022-10-03 LAB — ECHOCARDIOGRAM COMPLETE
AR max vel: 1.25 cm2
AV Area VTI: 1.29 cm2
AV Area mean vel: 1.16 cm2
AV Mean grad: 15.3 mmHg
AV Peak grad: 26.6 mmHg
Ao pk vel: 2.58 m/s
Area-P 1/2: 1.87 cm2
Calc EF: 69.1 %
S' Lateral: 2.3 cm
Single Plane A2C EF: 68.9 %
Single Plane A4C EF: 66.6 %

## 2022-10-03 NOTE — Progress Notes (Signed)
Echocardiogram 2D Echocardiogram has been performed.  Melissa Shannon 10/03/2022, 10:02 AM

## 2022-10-04 ENCOUNTER — Other Ambulatory Visit: Payer: Self-pay | Admitting: Nurse Practitioner

## 2022-10-04 DIAGNOSIS — Z794 Long term (current) use of insulin: Secondary | ICD-10-CM

## 2022-10-04 NOTE — Telephone Encounter (Signed)
It says that this medication denied by PCP Dewaine Oats Carlos American, NP.

## 2022-10-05 NOTE — Telephone Encounter (Signed)
Can we please call the patient and verify what dose of levemir she is taking? Thank you

## 2022-10-06 NOTE — Telephone Encounter (Signed)
Patient called and she states she takes 7units daily.

## 2022-10-11 ENCOUNTER — Telehealth: Payer: Self-pay | Admitting: *Deleted

## 2022-10-11 NOTE — Telephone Encounter (Signed)
Patient dropped off paperwork requesting a letter and form for the [place].   1.)Needs a letter or note from the office on letterhead stating that patient is NOT able to place the [employer] on street.   2.)Needs Accessible Collection Service Application form filled out.   Alger Memos, [place] [employer] [place] #[phone]  Placed form in [place] [facility] folder to review. To CALL PATIENT back once form and letter is ready for pick up [phone]

## 2022-10-13 ENCOUNTER — Other Ambulatory Visit: Payer: Self-pay | Admitting: Nurse Practitioner

## 2022-10-13 DIAGNOSIS — R413 Other amnesia: Secondary | ICD-10-CM

## 2022-10-17 ENCOUNTER — Encounter: Payer: Self-pay | Admitting: Nurse Practitioner

## 2022-10-18 ENCOUNTER — Telehealth: Payer: Self-pay

## 2022-10-18 DIAGNOSIS — Z794 Long term (current) use of insulin: Secondary | ICD-10-CM

## 2022-10-18 MED ORDER — LANTUS SOLOSTAR 100 UNIT/ML ~~LOC~~ SOPN: 7.0000 [IU] | PEN_INJECTOR | Freq: Every day | SUBCUTANEOUS | 11 refills | Status: DC

## 2022-10-18 NOTE — Telephone Encounter (Signed)
Spoke with patient and the letter she received did not states which medications they will cover. Patient would like for [place] to try to get PA for medication, since that was an option.

## 2022-10-18 NOTE — Telephone Encounter (Signed)
Patient called stating that [employer] will no longer pay for Levemir and needs something else sent to pharmacy.  Message routed to Sherrie Mustache, NP

## 2022-10-18 NOTE — Telephone Encounter (Signed)
What will they pay for?

## 2022-10-20 ENCOUNTER — Telehealth: Payer: Self-pay

## 2022-10-20 NOTE — Telephone Encounter (Signed)
Patient called stating that she received the letter for the City,but she is missing the part of the form that needed to be filled out and signed. Form is on your desk to be completed and signed. We will need to call patient when form is ready to be picked up  Message routed to Sherrie Mustache, NP

## 2022-10-28 ENCOUNTER — Other Ambulatory Visit: Payer: Self-pay | Admitting: Nurse Practitioner

## 2022-11-14 ENCOUNTER — Other Ambulatory Visit: Payer: Self-pay | Admitting: Pharmacist

## 2022-11-14 NOTE — Progress Notes (Signed)
Patient appearing on report for levemir usage in setting of upcoming levemir discontinuation. Of note, appears patient has been switched from levemir to lantus, therefore outreaching to ensure this change was successful and offer support/opportunity for any questions.  Outreached patient to discuss glucose control and medication management as well as offer support during transition to alternative insulin. Left voicemail for patient to return my call at their convenience.   Larinda Buttery, PharmD, BCPS Clinical Pharmacist [facility]

## 2022-11-24 ENCOUNTER — Other Ambulatory Visit: Payer: Self-pay | Admitting: Nurse Practitioner

## 2022-11-24 DIAGNOSIS — I509 Heart failure, unspecified: Secondary | ICD-10-CM

## 2022-12-24 ENCOUNTER — Other Ambulatory Visit: Payer: Self-pay | Admitting: Nurse Practitioner

## 2023-03-24 ENCOUNTER — Ambulatory Visit (INDEPENDENT_AMBULATORY_CARE_PROVIDER_SITE_OTHER): Payer: Medicare PPO | Admitting: Nurse Practitioner

## 2023-03-24 ENCOUNTER — Encounter: Payer: Self-pay | Admitting: Nurse Practitioner

## 2023-03-24 VITALS — BP 122/78 | HR 82 | Ht 68.0 in | Wt 182.0 lb

## 2023-03-24 DIAGNOSIS — E1122 Type 2 diabetes mellitus with diabetic chronic kidney disease: Secondary | ICD-10-CM | POA: Diagnosis not present

## 2023-03-24 DIAGNOSIS — Z794 Long term (current) use of insulin: Secondary | ICD-10-CM | POA: Diagnosis not present

## 2023-03-24 DIAGNOSIS — E034 Atrophy of thyroid (acquired): Secondary | ICD-10-CM | POA: Diagnosis not present

## 2023-03-24 DIAGNOSIS — I10 Essential (primary) hypertension: Secondary | ICD-10-CM

## 2023-03-24 DIAGNOSIS — Z0000 Encounter for general adult medical examination without abnormal findings: Secondary | ICD-10-CM

## 2023-03-24 DIAGNOSIS — N1832 Chronic kidney disease, stage 3b: Secondary | ICD-10-CM | POA: Diagnosis not present

## 2023-03-24 LAB — CBC WITH DIFFERENTIAL/PLATELET
Absolute Monocytes: 746 cells/uL (ref 200–950)
Basophils Absolute: 91 cells/uL (ref 0–200)
Basophils Relative: 1 %
Eosinophils Absolute: 182 cells/uL (ref 15–500)
Eosinophils Relative: 2 %
HCT: 43.9 % (ref 35.0–45.0)
Hemoglobin: 14.5 g/dL (ref 11.7–15.5)
Lymphs Abs: 2257 cells/uL (ref 850–3900)
MCH: 29.8 pg (ref 27.0–33.0)
MCHC: 33 g/dL (ref 32.0–36.0)
MCV: 90.1 fL (ref 80.0–100.0)
MPV: 12.3 fL (ref 7.5–12.5)
Monocytes Relative: 8.2 %
Neutro Abs: 5824 cells/uL (ref 1500–7800)
Neutrophils Relative %: 64 %
Platelets: 194 10*3/uL (ref 140–400)
RBC: 4.87 10*6/uL (ref 3.80–5.10)
RDW: 12.4 % (ref 11.0–15.0)
Total Lymphocyte: 24.8 %
WBC: 9.1 10*3/uL (ref 3.8–10.8)

## 2023-03-24 NOTE — Progress Notes (Addendum)
Subjective:   Melissa Shannon is a 89 y.o. female who presents for Medicare Annual (Subsequent) preventive examination.  Visit Complete: In person at [facility]    Review of Systems     Cardiac Risk Factors include: advanced age (>67men, >3 women);sedentary lifestyle;obesity (BMI >30kg/m2);hypertension;diabetes mellitus     Objective:    Today's Vitals   03/24/23 1023  BP: 122/78  Pulse: 82  Weight: 182 lb (82.6 kg)  Height: 5\' 8"  (1.727 m)   Body mass index is 27.67 kg/m.     03/24/2023   10:21 AM 08/10/2022    7:41 PM 04/22/2022   10:29 AM 03/17/2022   12:52 PM 09/10/2021   10:28 AM 06/24/2021   10:20 AM 03/10/2021    1:39 PM  Advanced Directives  Does Patient Have a Medical Advance Directive? Yes No Yes Yes Yes No Yes  Type of Advance Directive Out of facility DNR (pink MOST or yellow form)  Out of facility DNR (pink MOST or yellow form) Out of facility DNR (pink MOST or yellow form) Out of facility DNR (pink MOST or yellow form)  Out of facility DNR (pink MOST or yellow form)  Does patient want to make changes to medical advance directive? No - Patient declined  No - Patient declined No - Patient declined No - Patient declined  No - Patient declined  Would patient like information on creating a medical advance directive?      No - Patient declined   Pre-existing out of facility DNR order (yellow form or pink MOST form) Yellow form placed in chart (order not valid for inpatient use)   Yellow form placed in chart (order not valid for inpatient use) Yellow form placed in chart (order not valid for inpatient use)      Current Medications (verified) Outpatient Encounter Medications as of 03/24/2023  Medication Sig   amLODipine (NORVASC) 5 MG tablet TAKE 1 TABLET BY MOUTH EVERY DAY   apixaban (ELIQUIS) 5 MG TABS tablet TAKE 1 TABLET BY MOUTH TWICE A DAY   Cholecalciferol (VITAMIN D3) 5000 UNITS CHEW Chew 5,000 Units by mouth daily.    donepezil (ARICEPT) 10 MG tablet TAKE 1  TABLET BY MOUTH EVERY DAY AT BEDTIME TO PRESERVE MEMORY   furosemide (LASIX) 20 MG tablet Take 1 tablet (20 mg total) by mouth daily. DX I50.9   insulin detemir (LEVEMIR FLEXTOUCH) 100 UNIT/ML FlexPen Inject 7 Units into the skin daily.   Insulin Pen Needle (BD PEN NEEDLE NANO 2ND GEN) 32G X 4 MM MISC USE EVERY DAY   levothyroxine (SYNTHROID) 88 MCG tablet TAKE 1 TABLET BY MOUTH EVERY DAY BEFORE BREAKFAST   Multiple Vitamin (MULTIVITAMIN WITH MINERALS) TABS tablet Take 1 tablet by mouth daily.   Nutritional Supplements (GLUCERNA SHAKE PO) Take by mouth.   rosuvastatin (CRESTOR) 10 MG tablet TAKE 1 TABLET BY MOUTH EVERY DAY   TRADJENTA 5 MG TABS tablet TAKE 1 TABLET BY MOUTH EVERY DAY   ACCU-CHEK GUIDE test strip USE DAILY TO TEST BLOOD SUGAR (DX:E11.22) (Patient not taking: Reported on 03/24/2023)   Accu-Chek Softclix Lancets lancets USE EVERY OTHER DAY TO TEST BLOOD SUGAR (Patient not taking: Reported on 03/24/2023)   insulin glargine (LANTUS SOLOSTAR) 100 UNIT/ML Solostar Pen Inject 7 Units into the skin daily.   No facility-administered encounter medications on file as of 03/24/2023.    Allergies (verified) Lisinopril and Penicillins   History: Past Medical History:  Diagnosis Date   Arthritis    bilateral knees  Chronic diastolic CHF (congestive heart failure) (HCC)    CKD (chronic kidney disease), stage III (HCC)    Diabetes mellitus without complication (HCC)    Does use hearing aid    Edema    Hyperlipidemia    Hypertension    Hypothyroidism    Pulmonary embolism (HCC)    Urinary incontinence    Past Surgical History:  Procedure Laterality Date   BLADDER SURGERY  2000   Dr.Tannerbaum (Bladder Tact)   CATARACT EXTRACTION  03/2013   Dr.Shapiro   CHOLECYSTECTOMY     EYE SURGERY     bilateral cataracts   PILONIDAL CYST EXCISION  1952   [facility]    right elbow  1999   x 2, still has one piece of metal in it   TONSILLECTOMY AND ADENOIDECTOMY  1937   Dr.Brewer    Family History  Problem Relation Age of Onset   Suicidality Father    Cancer Brother    Cancer Maternal Grandfather    Diabetes Maternal Grandmother    Melanoma Brother    Social History   Socioeconomic History   Marital status: Widowed    Spouse name: Not on file   Number of children: Not on file   Years of education: Not on file   Highest education level: Not on file  Occupational History   Occupation: Retired Runner, broadcasting/film/video  Tobacco Use   Smoking status: Never   Smokeless tobacco: Never  Vaping Use   Vaping status: Never Used  Substance and Sexual Activity   Alcohol use: No   Drug use: No   Sexual activity: Never  Other Topics Concern   Not on file  Social History Narrative   Diet: Low sodium    Do you drink/eat things with caffeine? Yes, tea   Widowed, married 1952   Lives in a house, yes- 1 or more stories, 1 person in home. No pets    Current/past profession- Teacher    Patient exercises with walker twice daily        Social Determinants of Health   Financial Resource Strain: Low Risk  (02/23/2018)   Overall Financial Resource Strain (CARDIA)    Difficulty of Paying Living Expenses: Not hard at all  Food Insecurity: No Food Insecurity (06/09/2022)   Hunger Vital Sign    Worried About Running Out of Food in the Last Year: Never true    Ran Out of Food in the Last Year: Never true  Transportation Needs: No Transportation Needs (06/09/2022)   PRAPARE - Administrator, Civil Service (Medical): No    Lack of Transportation (Non-Medical): No  Physical Activity: Insufficiently Active (02/23/2018)   Exercise Vital Sign    Days of Exercise per Week: 7 days    Minutes of Exercise per Session: 20 min  Stress: No Stress Concern Present (02/23/2018)   [employer] of Occupational Health - Occupational Stress Questionnaire    Feeling of Stress : Not at all  Social Connections: Moderately Isolated (02/23/2018)   Social Connection and Isolation Panel [NHANES]     Frequency of Communication with Friends and Family: Three times a week    Frequency of Social Gatherings with Friends and Family: Once a week    Attends Religious Services: Never    Database administrator or Organizations: No    Attends Banker Meetings: Never    Marital Status: Widowed    Tobacco Counseling Counseling given: Not Answered   Clinical Intake:  Pre-visit preparation  completed: Yes  Pain : No/denies pain                  Activities of Daily Living    03/24/2023   11:11 AM  In your present state of health, do you have any difficulty performing the following activities:  Hearing? 1  Vision? 0  Difficulty concentrating or making decisions? 1  Walking or climbing stairs? 1  Dressing or bathing? 0  Doing errands, shopping? 1  Preparing Food and eating ? N  Using the Toilet? N  In the past six months, have you accidently leaked urine? Y  Do you have problems with loss of bowel control? N  Managing your Medications? N  Managing your Finances? N  Housekeeping or managing your Housekeeping? N    Patient Care Team: Sharon Seller, NP as PCP - General (Nurse Practitioner) Laurey Morale, MD as PCP - Advanced Heart Failure (Cardiology) Nyoka Cowden, MD as Consulting Physician (Pulmonary Disease) Laurey Morale, MD as Consulting Physician (Cardiology)  Indicate any recent Medical Services you may have received from other than [facility] providers in the past year (date may be approximate).     Assessment:   This is a routine wellness examination for [place].  Hearing/Vision screen Hearing Screening - Comments:: Patient admits to hearing loss and has hearing aids, which is not working for her  Vision Screening - Comments:: Last eye exam less than 12 months ago with Dr.Shapiro (December 2023)   Dietary issues and exercise activities discussed:     Goals Addressed   None    Depression Screen    03/24/2023   10:19 AM 03/17/2022    12:55 PM 01/17/2022   10:08 AM 03/10/2021    1:38 PM 07/03/2020    9:21 AM 03/06/2020    3:42 PM 07/01/2019    9:52 AM  PHQ 2/9 Scores  PHQ - 2 Score 0 0 0 0 0 0 0    Fall Risk    03/24/2023   10:19 AM 09/30/2022   10:41 AM 04/22/2022   10:29 AM 03/17/2022   12:54 PM 01/17/2022   10:07 AM  Fall Risk   Falls in the past year? 0 0 0 0 0  Number falls in past yr: 0 0 0 0 0  Injury with Fall? 0 0 0 0 0  Risk for fall due to : No Fall Risks No Fall Risks No Fall Risks No Fall Risks No Fall Risks  Follow up Falls evaluation completed Falls evaluation completed Falls evaluation completed Falls evaluation completed Falls evaluation completed    MEDICARE RISK AT HOME:   TIMED UP AND GO:  Was the test performed?  No    Cognitive Function:    03/24/2023   10:25 AM 02/27/2019   10:33 AM 02/23/2018   10:15 AM 02/17/2017   11:06 AM 02/11/2016   11:00 AM  MMSE - Mini Mental State Exam  Orientation to time 5 5 5 5 5   Orientation to Place 4 5 3 4 5   Registration 3 3 3 3 3   Attention/ Calculation 5 5 5 5 5   Recall 2 3 2 1 2   Language- name 2 objects 2 2 2 2 2   Language- repeat 1 1 1 1 1   Language- follow 3 step command 3 3 3 2 3   Language- read & follow direction 1 1 1 1 1   Write a sentence 1 1 1 1 1   Copy design 1 1 1 1  1  Total score 28 30 27 26 29         03/17/2022   12:56 PM 03/10/2021    1:40 PM 03/06/2020    3:43 PM  6CIT Screen  What Year? 0 points 0 points 0 points  What month? 0 points 0 points 0 points  What time? 0 points 0 points 0 points  Count back from 20 0 points 0 points 0 points  Months in reverse 0 points 0 points 0 points  Repeat phrase 0 points 0 points 2 points  Total Score 0 points 0 points 2 points    Immunizations Immunization History  Administered Date(s) Administered   PFIZER(Purple Top)SARS-COV-2 Vaccination 09/21/2019, 10/14/2019, 06/03/2020   Pneumococcal Polysaccharide-23 04/06/2009   Td 04/06/2009   Pt has declined TDAP, flu, pneumonia and shingles  vaccine   Screening Tests Health Maintenance  Topic Date Due   OPHTHALMOLOGY EXAM  07/15/2022   HEMOGLOBIN A1C  10/21/2022   COVID-19 Vaccine (4 - 2023-24 season) 04/09/2023 (Originally 04/15/2022)   Pneumonia Vaccine 67+ Years old (2 of 2 - PCV) 03/23/2024 (Originally 04/06/2010)   INFLUENZA VACCINE  08/15/2048 (Originally 03/16/2023)   FOOT EXAM  03/23/2024   Medicare Annual Wellness (AWV)  03/23/2024   DEXA SCAN  Completed   HPV VACCINES  Aged Out   DTaP/Tdap/Td  Discontinued   Zoster Vaccines- Shingrix  Discontinued    Health Maintenance  Health Maintenance Due  Topic Date Due   OPHTHALMOLOGY EXAM  07/15/2022   HEMOGLOBIN A1C  10/21/2022    Colorectal cancer screening: No longer required.   Mammogram status: No longer required due to age.  Bone Density status: Completed  . Results reflect: Bone density results: OSTEOPOROSIS. Repeat every 2 years.  Lung Cancer Screening: (Low Dose CT Chest recommended if Age 62-80 years, 20 pack-year currently smoking OR have quit w/in 15years.) does not qualify.   Lung Cancer Screening Referral: na  Additional Screening:  Hepatitis C Screening: does not qualify  Vision Screening: Recommended annual ophthalmology exams for early detection of glaucoma and other disorders of the eye. Is the patient up to date with their annual eye exam?  Yes  Who is the provider or what is the name of the office in which the patient attends annual eye exams? [facility] If pt is not established with a provider, would they like to be referred to a provider to establish care? Yes .   Dental Screening: Recommended annual dental exams for proper oral hygiene  Diabetic Foot Exam: Diabetic Foot Exam: Completed today  Community Resource Referral / Chronic Care Management: CRR required this visit?  No   CCM required this visit?  No     Plan:     I have personally reviewed and noted the following in the patient's chart:   Medical and social history Use  of alcohol, tobacco or illicit drugs  Current medications and supplements including opioid prescriptions. Patient is not currently taking opioid prescriptions. Functional ability and status Nutritional status Physical activity Advanced directives List of other physicians Hospitalizations, surgeries, and ER visits in previous 12 months Vitals Screenings to include cognitive, depression, and falls Referrals and appointments  In addition, I have reviewed and discussed with patient certain preventive protocols, quality metrics, and best practice recommendations. A written personalized care plan for preventive services as well as general preventive health recommendations were provided to patient.     Sharon Seller, NP   03/24/2023

## 2023-03-24 NOTE — Patient Instructions (Addendum)
Ask for lantus when you need a refill for your insulin.    Melissa Shannon , Thank you for taking time to come for your Medicare Wellness Visit. I appreciate your ongoing commitment to your health goals. Please review the following plan we discussed and let me know if I can assist you in the future.    Goals      Maintain Lifestyle     Patient will continue to watch salt intake, water intake, and walking        This is a list of the screening recommended for you and due dates:  Health Maintenance  Topic Date Due   Eye exam for diabetics  07/15/2022   Hemoglobin A1C  10/21/2022   COVID-19 Vaccine (4 - 2023-24 season) 04/09/2023*   Pneumonia Vaccine (2 of 2 - PCV) 03/23/2024*   Flu Shot  08/15/2048*   Complete foot exam   03/23/2024   Medicare Annual Wellness Visit  03/23/2024   DEXA scan (bone density measurement)  Completed   HPV Vaccine  Aged Out   DTaP/Tdap/Td vaccine  Discontinued   Zoster (Shingles) Vaccine  Discontinued  *Topic was postponed. The date shown is not the original due date.

## 2023-03-27 ENCOUNTER — Encounter: Payer: Self-pay | Admitting: Nurse Practitioner

## 2023-03-27 ENCOUNTER — Ambulatory Visit: Payer: Medicare PPO | Admitting: Nurse Practitioner

## 2023-03-27 VITALS — BP 128/84 | HR 86 | Temp 97.7°F | Ht 68.0 in | Wt 182.0 lb

## 2023-03-27 DIAGNOSIS — E034 Atrophy of thyroid (acquired): Secondary | ICD-10-CM

## 2023-03-27 DIAGNOSIS — N184 Chronic kidney disease, stage 4 (severe): Secondary | ICD-10-CM | POA: Diagnosis not present

## 2023-03-27 DIAGNOSIS — B001 Herpesviral vesicular dermatitis: Secondary | ICD-10-CM

## 2023-03-27 DIAGNOSIS — M170 Bilateral primary osteoarthritis of knee: Secondary | ICD-10-CM | POA: Diagnosis not present

## 2023-03-27 DIAGNOSIS — R5381 Other malaise: Secondary | ICD-10-CM | POA: Diagnosis not present

## 2023-03-27 DIAGNOSIS — E1122 Type 2 diabetes mellitus with diabetic chronic kidney disease: Secondary | ICD-10-CM | POA: Diagnosis not present

## 2023-03-27 DIAGNOSIS — I10 Essential (primary) hypertension: Secondary | ICD-10-CM

## 2023-03-27 DIAGNOSIS — I5032 Chronic diastolic (congestive) heart failure: Secondary | ICD-10-CM | POA: Diagnosis not present

## 2023-03-27 DIAGNOSIS — N1832 Chronic kidney disease, stage 3b: Secondary | ICD-10-CM

## 2023-03-27 DIAGNOSIS — M810 Age-related osteoporosis without current pathological fracture: Secondary | ICD-10-CM | POA: Diagnosis not present

## 2023-03-27 DIAGNOSIS — Z794 Long term (current) use of insulin: Secondary | ICD-10-CM

## 2023-03-27 NOTE — Patient Instructions (Signed)
To use OTC abreva lip medication to help heal

## 2023-03-27 NOTE — Progress Notes (Signed)
Careteam: Patient Care Team: Sharon Seller, NP as PCP - General (Nurse Practitioner) Laurey Morale, MD as PCP - Advanced Heart Failure (Cardiology) Nyoka Cowden, MD as Consulting Physician (Pulmonary Disease) Laurey Morale, MD as Consulting Physician (Cardiology)  PLACE OF SERVICE:  [facility]  Advanced Directive information Does Patient Have a Medical Advance Directive?: Yes, Type of Advance Directive: Out of facility DNR (pink MOST or yellow form), Pre-existing out of facility DNR order (yellow form or pink MOST form): Yellow form placed in chart (order not valid for inpatient use), Does patient want to make changes to medical advance directive?: No - Patient declined  Allergies  Allergen Reactions   Lisinopril Shortness Of Breath    Tracheal Edema   Penicillins Other (See Comments)    DID THE REACTION INVOLVE: Swelling of the face/tongue/throat, SOB, or low BP? Yes Sudden or severe rash/hives, skin peeling, or the inside of the mouth or nose? No Did it require medical treatment? No When did it last happen?      more than 10 years  If all above answers are "NO", may proceed with cephalosporin use.   Throat felt tight    Chief Complaint  Patient presents with   Medical Management of Chronic Issues    Routine visit. Patient is currently using levemir until she uses it up then will switch to Lantus solostar. Patient c/o difficulty walking. Patient had pill bottles and insulin at appointment today.      HPI: Patient is a 89 y.o. female for routine follow up.  She has lost 20 lbs since February. She is eating less overall.   Did not bring supplements today to visit- unsure if she is on a calcium supplement.   DM- no hypoglycemia A1c at 7.4 on lab.  She is currently on levemir 7 units daily Will start lantus once her supply of levemir runs out.   Reports ongoing difficulty walking. Does not exercise.  Not as sturdy on her feet.  Feeling more pain in her  knees.  Uses walker and feels more imbalanced. Lives alone.  Has a chair lift that helps her get up and down her stairs  Has a sore on her lip. Can tell something is there but does not hurt, burn or tingle. Noted a few days ago when she was putting balm on her lips   Review of Systems:  Review of Systems  Constitutional:  Negative for chills, fever and weight loss.  HENT:  Negative for tinnitus.   Respiratory:  Negative for cough, sputum production and shortness of breath.   Cardiovascular:  Negative for chest pain, palpitations and leg swelling.  Gastrointestinal:  Negative for abdominal pain, constipation, diarrhea and heartburn.  Genitourinary:  Negative for dysuria, frequency and urgency.  Musculoskeletal:  Positive for joint pain. Negative for back pain, falls and myalgias.  Skin: Negative.   Neurological:  Positive for weakness. Negative for dizziness and headaches.  Psychiatric/Behavioral:  Negative for depression and memory loss. The patient does not have insomnia.     Past Medical History:  Diagnosis Date   Arthritis    bilateral knees   Chronic diastolic CHF (congestive heart failure) (HCC)    CKD (chronic kidney disease), stage III (HCC)    Diabetes mellitus without complication (HCC)    Does use hearing aid    Edema    Hyperlipidemia    Hypertension    Hypothyroidism    Pulmonary embolism (HCC)    Urinary incontinence  Past Surgical History:  Procedure Laterality Date   BLADDER SURGERY  2000   Dr.Tannerbaum (Bladder Tact)   CATARACT EXTRACTION  03/2013   Dr.Shapiro   CHOLECYSTECTOMY     EYE SURGERY     bilateral cataracts   PILONIDAL CYST EXCISION  1952   [facility]    right elbow  1999   x 2, still has one piece of metal in it   TONSILLECTOMY AND ADENOIDECTOMY  1937   Dr.Brewer   Social History:   reports that she has never smoked. She has never used smokeless tobacco. She reports that she does not drink alcohol and does not use  drugs.  Family History  Problem Relation Age of Onset   Suicidality Father    Cancer Brother    Cancer Maternal Grandfather    Diabetes Maternal Grandmother    Melanoma Brother     Medications: Patient's Medications  New Prescriptions   No medications on file  Previous Medications   ACCU-CHEK GUIDE TEST STRIP    USE DAILY TO TEST BLOOD SUGAR (DX:E11.22)   ACCU-CHEK SOFTCLIX LANCETS LANCETS    USE EVERY OTHER DAY TO TEST BLOOD SUGAR   AMLODIPINE (NORVASC) 5 MG TABLET    TAKE 1 TABLET BY MOUTH EVERY DAY   APIXABAN (ELIQUIS) 5 MG TABS TABLET    TAKE 1 TABLET BY MOUTH TWICE A DAY   CHOLECALCIFEROL (VITAMIN D3) 5000 UNITS CHEW    Chew 5,000 Units by mouth daily.    DONEPEZIL (ARICEPT) 10 MG TABLET    TAKE 1 TABLET BY MOUTH EVERY DAY AT BEDTIME TO PRESERVE MEMORY   FUROSEMIDE (LASIX) 20 MG TABLET    Take 1 tablet (20 mg total) by mouth daily. DX I50.9   INSULIN DETEMIR (LEVEMIR FLEXTOUCH) 100 UNIT/ML FLEXPEN    Inject 7 Units into the skin daily.   INSULIN GLARGINE (LANTUS SOLOSTAR) 100 UNIT/ML SOLOSTAR PEN    Inject 7 Units into the skin daily.   INSULIN PEN NEEDLE (BD PEN NEEDLE NANO 2ND GEN) 32G X 4 MM MISC    USE EVERY DAY   LEVOTHYROXINE (SYNTHROID) 88 MCG TABLET    TAKE 1 TABLET BY MOUTH EVERY DAY BEFORE BREAKFAST   MULTIPLE VITAMIN (MULTIVITAMIN WITH MINERALS) TABS TABLET    Take 1 tablet by mouth daily.   NUTRITIONAL SUPPLEMENTS (GLUCERNA SHAKE PO)    Take by mouth.   ROSUVASTATIN (CRESTOR) 10 MG TABLET    TAKE 1 TABLET BY MOUTH EVERY DAY   TRADJENTA 5 MG TABS TABLET    TAKE 1 TABLET BY MOUTH EVERY DAY  Modified Medications   No medications on file  Discontinued Medications   No medications on file    Physical Exam:  Vitals:   03/27/23 1102  BP: 128/84  Pulse: 86  Temp: 97.7 F (36.5 C)  TempSrc: Temporal  SpO2: 94%  Weight: 182 lb (82.6 kg)  Height: 5\' 8"  (1.727 m)   Body mass index is 27.67 kg/m. Wt Readings from Last 3 Encounters:  03/27/23 182 lb (82.6 kg)   03/24/23 182 lb (82.6 kg)  09/30/22 202 lb 3.2 oz (91.7 kg)    Physical Exam Constitutional:      General: She is not in acute distress.    Appearance: She is well-developed. She is not diaphoretic.  HENT:     Head: Normocephalic and atraumatic.     Mouth/Throat:     Pharynx: No oropharyngeal exudate.  Eyes:     Conjunctiva/sclera: Conjunctivae normal.  Pupils: Pupils are equal, round, and reactive to light.  Cardiovascular:     Rate and Rhythm: Normal rate and regular rhythm.     Heart sounds: Normal heart sounds.  Pulmonary:     Effort: Pulmonary effort is normal.     Breath sounds: Normal breath sounds.  Abdominal:     General: Bowel sounds are normal.     Palpations: Abdomen is soft.  Musculoskeletal:     Cervical back: Normal range of motion and neck supple.     Right lower leg: No edema.     Left lower leg: No edema.  Skin:    General: Skin is warm and dry.  Neurological:     Mental Status: She is alert and oriented to person, place, and time.     Motor: Weakness present.     Gait: Gait abnormal (walks with walker).  Psychiatric:        Mood and Affect: Mood normal.     Labs reviewed: Basic Metabolic Panel: Recent Labs    07/18/22 1105 08/10/22 1950 08/31/22 1135 03/24/23 1100  NA 141 140 141 142  K 4.2 3.8 4.0 4.3  CL 103 101 105 104  CO2 26 28 28 27   GLUCOSE 159* 150* 174* 163*  BUN 35* 18 34* 45*  CREATININE 1.31* 1.34* 1.18* 1.31*  CALCIUM 10.4 9.5 10.4* 11.0*  TSH 2.20  --   --  1.13   Liver Function Tests: Recent Labs    04/22/22 1128 03/24/23 1100  AST 16 11  ALT 22 9  BILITOT 0.6 0.5  PROT 6.4 6.4   No results for input(s): "LIPASE", "AMYLASE" in the last 8760 hours. No results for input(s): "AMMONIA" in the last 8760 hours. CBC: Recent Labs    07/18/22 1105 08/10/22 1950 08/31/22 1135 03/24/23 1100  WBC 10.0 7.1 8.5 9.1  NEUTROABS 6,710 5.8  --  5,824  HGB 14.5 14.6 13.8 14.5  HCT 44.5 44.3 42.5 43.9  MCV 91.0 92.1  91.0 90.1  PLT 209 155 216 194   Lipid Panel: Recent Labs    03/24/23 1100  CHOL 156  HDL 59  LDLCALC 74  TRIG 142  CHOLHDL 2.6   TSH: Recent Labs    07/18/22 1105 03/24/23 1100  TSH 2.20 1.13   A1C: Lab Results  Component Value Date   HGBA1C 7.4 (H) 03/24/2023     Assessment/Plan 1. Hypothyroidism due to acquired atrophy of thyroid -TSH at goal, continues on synthroid 88 mcg  2. Type 2 diabetes mellitus with stage 3b chronic kidney disease, with long-term current use of insulin (HCC) -Encouraged dietary compliance, routine foot care/monitoring and to keep up with diabetic eye exams through ophthalmology  -A1c at goal today, continue current medication with dietary modifications.  - Hemoglobin A1c; Future  3. Chronic diastolic CHF (congestive heart failure) (HCC) Euvolemic. Continues on lasix daily   4. Osteoporosis, unspecified osteoporosis type, unspecified pathological fracture presence Continues on prolia, unsure if she is taking a calcium supplement  Continues on vit d  5. Primary hypertension -Blood pressure well controlled, goal bp <140/90 Continue current medications and dietary modifications follow metabolic panel - COMPLETE METABOLIC PANEL WITH GFR; Future - CBC with Differential/Platelet; Future  6. Stage 4 chronic kidney disease (HCC) -Chronic and stable Encourage proper hydration Follow metabolic panel Avoid nephrotoxic meds (NSAIDS) - COMPLETE METABOLIC PANEL WITH GFR; Future  7. Debility Progressively more weak and increase with feelings of imbalance.  - Ambulatory referral to Home Health for  gait and strength training.   8. Cold sore No pain at this time. Will have her use abreva OTC PRN.   9. Primary osteoarthritis of both knees -ongoing, improving gait and mobility will additional help with OA.   Return in about 6 months (around 09/27/2023) for routine follow up, labs prior to appt  (make her prolia injection at same time)  .  Janene Harvey. [facility] [phone]

## 2023-03-30 DIAGNOSIS — E034 Atrophy of thyroid (acquired): Secondary | ICD-10-CM | POA: Diagnosis not present

## 2023-03-30 DIAGNOSIS — R5381 Other malaise: Secondary | ICD-10-CM | POA: Diagnosis not present

## 2023-03-30 DIAGNOSIS — M170 Bilateral primary osteoarthritis of knee: Secondary | ICD-10-CM | POA: Diagnosis not present

## 2023-03-30 DIAGNOSIS — I130 Hypertensive heart and chronic kidney disease with heart failure and stage 1 through stage 4 chronic kidney disease, or unspecified chronic kidney disease: Secondary | ICD-10-CM | POA: Diagnosis not present

## 2023-03-30 DIAGNOSIS — B001 Herpesviral vesicular dermatitis: Secondary | ICD-10-CM | POA: Diagnosis not present

## 2023-03-30 DIAGNOSIS — N184 Chronic kidney disease, stage 4 (severe): Secondary | ICD-10-CM | POA: Diagnosis not present

## 2023-03-30 DIAGNOSIS — I5032 Chronic diastolic (congestive) heart failure: Secondary | ICD-10-CM | POA: Diagnosis not present

## 2023-03-30 DIAGNOSIS — M810 Age-related osteoporosis without current pathological fracture: Secondary | ICD-10-CM | POA: Diagnosis not present

## 2023-03-30 DIAGNOSIS — E1122 Type 2 diabetes mellitus with diabetic chronic kidney disease: Secondary | ICD-10-CM | POA: Diagnosis not present

## 2023-04-03 ENCOUNTER — Ambulatory Visit (INDEPENDENT_AMBULATORY_CARE_PROVIDER_SITE_OTHER): Payer: Medicare PPO | Admitting: *Deleted

## 2023-04-03 DIAGNOSIS — M810 Age-related osteoporosis without current pathological fracture: Secondary | ICD-10-CM | POA: Diagnosis not present

## 2023-04-03 MED ORDER — DENOSUMAB 60 MG/ML ~~LOC~~ SOSY
60.0000 mg | PREFILLED_SYRINGE | Freq: Once | SUBCUTANEOUS | Status: AC
  Administered 2023-04-03: 60 mg via SUBCUTANEOUS

## 2023-04-07 DIAGNOSIS — E1122 Type 2 diabetes mellitus with diabetic chronic kidney disease: Secondary | ICD-10-CM | POA: Diagnosis not present

## 2023-04-07 DIAGNOSIS — M810 Age-related osteoporosis without current pathological fracture: Secondary | ICD-10-CM | POA: Diagnosis not present

## 2023-04-07 DIAGNOSIS — B001 Herpesviral vesicular dermatitis: Secondary | ICD-10-CM | POA: Diagnosis not present

## 2023-04-07 DIAGNOSIS — E034 Atrophy of thyroid (acquired): Secondary | ICD-10-CM | POA: Diagnosis not present

## 2023-04-07 DIAGNOSIS — N184 Chronic kidney disease, stage 4 (severe): Secondary | ICD-10-CM | POA: Diagnosis not present

## 2023-04-07 DIAGNOSIS — M170 Bilateral primary osteoarthritis of knee: Secondary | ICD-10-CM | POA: Diagnosis not present

## 2023-04-07 DIAGNOSIS — I5032 Chronic diastolic (congestive) heart failure: Secondary | ICD-10-CM | POA: Diagnosis not present

## 2023-04-07 DIAGNOSIS — R5381 Other malaise: Secondary | ICD-10-CM | POA: Diagnosis not present

## 2023-04-07 DIAGNOSIS — I130 Hypertensive heart and chronic kidney disease with heart failure and stage 1 through stage 4 chronic kidney disease, or unspecified chronic kidney disease: Secondary | ICD-10-CM | POA: Diagnosis not present

## 2023-04-09 ENCOUNTER — Other Ambulatory Visit: Payer: Self-pay | Admitting: Nurse Practitioner

## 2023-04-09 DIAGNOSIS — R413 Other amnesia: Secondary | ICD-10-CM

## 2023-04-11 DIAGNOSIS — E1122 Type 2 diabetes mellitus with diabetic chronic kidney disease: Secondary | ICD-10-CM | POA: Diagnosis not present

## 2023-04-11 DIAGNOSIS — I130 Hypertensive heart and chronic kidney disease with heart failure and stage 1 through stage 4 chronic kidney disease, or unspecified chronic kidney disease: Secondary | ICD-10-CM | POA: Diagnosis not present

## 2023-04-11 DIAGNOSIS — I5032 Chronic diastolic (congestive) heart failure: Secondary | ICD-10-CM | POA: Diagnosis not present

## 2023-04-11 DIAGNOSIS — M170 Bilateral primary osteoarthritis of knee: Secondary | ICD-10-CM | POA: Diagnosis not present

## 2023-04-11 DIAGNOSIS — E034 Atrophy of thyroid (acquired): Secondary | ICD-10-CM | POA: Diagnosis not present

## 2023-04-11 DIAGNOSIS — B001 Herpesviral vesicular dermatitis: Secondary | ICD-10-CM | POA: Diagnosis not present

## 2023-04-11 DIAGNOSIS — N184 Chronic kidney disease, stage 4 (severe): Secondary | ICD-10-CM | POA: Diagnosis not present

## 2023-04-11 DIAGNOSIS — R5381 Other malaise: Secondary | ICD-10-CM | POA: Diagnosis not present

## 2023-04-11 DIAGNOSIS — M810 Age-related osteoporosis without current pathological fracture: Secondary | ICD-10-CM | POA: Diagnosis not present

## 2023-04-21 DIAGNOSIS — M810 Age-related osteoporosis without current pathological fracture: Secondary | ICD-10-CM | POA: Diagnosis not present

## 2023-04-21 DIAGNOSIS — E1122 Type 2 diabetes mellitus with diabetic chronic kidney disease: Secondary | ICD-10-CM | POA: Diagnosis not present

## 2023-04-21 DIAGNOSIS — N184 Chronic kidney disease, stage 4 (severe): Secondary | ICD-10-CM | POA: Diagnosis not present

## 2023-04-21 DIAGNOSIS — B001 Herpesviral vesicular dermatitis: Secondary | ICD-10-CM | POA: Diagnosis not present

## 2023-04-21 DIAGNOSIS — I130 Hypertensive heart and chronic kidney disease with heart failure and stage 1 through stage 4 chronic kidney disease, or unspecified chronic kidney disease: Secondary | ICD-10-CM | POA: Diagnosis not present

## 2023-04-21 DIAGNOSIS — I5032 Chronic diastolic (congestive) heart failure: Secondary | ICD-10-CM | POA: Diagnosis not present

## 2023-04-21 DIAGNOSIS — M170 Bilateral primary osteoarthritis of knee: Secondary | ICD-10-CM | POA: Diagnosis not present

## 2023-04-21 DIAGNOSIS — E034 Atrophy of thyroid (acquired): Secondary | ICD-10-CM | POA: Diagnosis not present

## 2023-04-21 DIAGNOSIS — R5381 Other malaise: Secondary | ICD-10-CM | POA: Diagnosis not present

## 2023-04-24 ENCOUNTER — Other Ambulatory Visit: Payer: Self-pay | Admitting: Nurse Practitioner

## 2023-04-24 ENCOUNTER — Other Ambulatory Visit: Payer: Self-pay | Admitting: Adult Health

## 2023-04-29 DIAGNOSIS — I5032 Chronic diastolic (congestive) heart failure: Secondary | ICD-10-CM | POA: Diagnosis not present

## 2023-04-29 DIAGNOSIS — I130 Hypertensive heart and chronic kidney disease with heart failure and stage 1 through stage 4 chronic kidney disease, or unspecified chronic kidney disease: Secondary | ICD-10-CM | POA: Diagnosis not present

## 2023-04-29 DIAGNOSIS — M170 Bilateral primary osteoarthritis of knee: Secondary | ICD-10-CM | POA: Diagnosis not present

## 2023-04-29 DIAGNOSIS — R5381 Other malaise: Secondary | ICD-10-CM | POA: Diagnosis not present

## 2023-04-29 DIAGNOSIS — N184 Chronic kidney disease, stage 4 (severe): Secondary | ICD-10-CM | POA: Diagnosis not present

## 2023-04-29 DIAGNOSIS — E1122 Type 2 diabetes mellitus with diabetic chronic kidney disease: Secondary | ICD-10-CM | POA: Diagnosis not present

## 2023-04-29 DIAGNOSIS — M810 Age-related osteoporosis without current pathological fracture: Secondary | ICD-10-CM | POA: Diagnosis not present

## 2023-04-29 DIAGNOSIS — E034 Atrophy of thyroid (acquired): Secondary | ICD-10-CM | POA: Diagnosis not present

## 2023-04-29 DIAGNOSIS — B001 Herpesviral vesicular dermatitis: Secondary | ICD-10-CM | POA: Diagnosis not present

## 2023-05-05 DIAGNOSIS — R5381 Other malaise: Secondary | ICD-10-CM | POA: Diagnosis not present

## 2023-05-05 DIAGNOSIS — I5032 Chronic diastolic (congestive) heart failure: Secondary | ICD-10-CM | POA: Diagnosis not present

## 2023-05-05 DIAGNOSIS — I130 Hypertensive heart and chronic kidney disease with heart failure and stage 1 through stage 4 chronic kidney disease, or unspecified chronic kidney disease: Secondary | ICD-10-CM | POA: Diagnosis not present

## 2023-05-05 DIAGNOSIS — M810 Age-related osteoporosis without current pathological fracture: Secondary | ICD-10-CM | POA: Diagnosis not present

## 2023-05-05 DIAGNOSIS — E1122 Type 2 diabetes mellitus with diabetic chronic kidney disease: Secondary | ICD-10-CM | POA: Diagnosis not present

## 2023-05-05 DIAGNOSIS — E034 Atrophy of thyroid (acquired): Secondary | ICD-10-CM | POA: Diagnosis not present

## 2023-05-05 DIAGNOSIS — B001 Herpesviral vesicular dermatitis: Secondary | ICD-10-CM | POA: Diagnosis not present

## 2023-05-05 DIAGNOSIS — M170 Bilateral primary osteoarthritis of knee: Secondary | ICD-10-CM | POA: Diagnosis not present

## 2023-05-05 DIAGNOSIS — N184 Chronic kidney disease, stage 4 (severe): Secondary | ICD-10-CM | POA: Diagnosis not present

## 2023-05-10 DIAGNOSIS — N184 Chronic kidney disease, stage 4 (severe): Secondary | ICD-10-CM | POA: Diagnosis not present

## 2023-05-10 DIAGNOSIS — I5032 Chronic diastolic (congestive) heart failure: Secondary | ICD-10-CM | POA: Diagnosis not present

## 2023-05-10 DIAGNOSIS — I130 Hypertensive heart and chronic kidney disease with heart failure and stage 1 through stage 4 chronic kidney disease, or unspecified chronic kidney disease: Secondary | ICD-10-CM | POA: Diagnosis not present

## 2023-05-10 DIAGNOSIS — M170 Bilateral primary osteoarthritis of knee: Secondary | ICD-10-CM | POA: Diagnosis not present

## 2023-05-10 DIAGNOSIS — E1122 Type 2 diabetes mellitus with diabetic chronic kidney disease: Secondary | ICD-10-CM | POA: Diagnosis not present

## 2023-05-10 DIAGNOSIS — B001 Herpesviral vesicular dermatitis: Secondary | ICD-10-CM | POA: Diagnosis not present

## 2023-05-10 DIAGNOSIS — E034 Atrophy of thyroid (acquired): Secondary | ICD-10-CM | POA: Diagnosis not present

## 2023-05-10 DIAGNOSIS — M810 Age-related osteoporosis without current pathological fracture: Secondary | ICD-10-CM | POA: Diagnosis not present

## 2023-05-10 DIAGNOSIS — R5381 Other malaise: Secondary | ICD-10-CM | POA: Diagnosis not present

## 2023-05-17 DIAGNOSIS — M810 Age-related osteoporosis without current pathological fracture: Secondary | ICD-10-CM | POA: Diagnosis not present

## 2023-05-17 DIAGNOSIS — I5032 Chronic diastolic (congestive) heart failure: Secondary | ICD-10-CM | POA: Diagnosis not present

## 2023-05-17 DIAGNOSIS — E034 Atrophy of thyroid (acquired): Secondary | ICD-10-CM | POA: Diagnosis not present

## 2023-05-17 DIAGNOSIS — N184 Chronic kidney disease, stage 4 (severe): Secondary | ICD-10-CM | POA: Diagnosis not present

## 2023-05-17 DIAGNOSIS — E1122 Type 2 diabetes mellitus with diabetic chronic kidney disease: Secondary | ICD-10-CM | POA: Diagnosis not present

## 2023-05-17 DIAGNOSIS — R5381 Other malaise: Secondary | ICD-10-CM | POA: Diagnosis not present

## 2023-05-17 DIAGNOSIS — M170 Bilateral primary osteoarthritis of knee: Secondary | ICD-10-CM | POA: Diagnosis not present

## 2023-05-17 DIAGNOSIS — B001 Herpesviral vesicular dermatitis: Secondary | ICD-10-CM | POA: Diagnosis not present

## 2023-05-17 DIAGNOSIS — I130 Hypertensive heart and chronic kidney disease with heart failure and stage 1 through stage 4 chronic kidney disease, or unspecified chronic kidney disease: Secondary | ICD-10-CM | POA: Diagnosis not present

## 2023-05-24 DIAGNOSIS — M810 Age-related osteoporosis without current pathological fracture: Secondary | ICD-10-CM | POA: Diagnosis not present

## 2023-05-24 DIAGNOSIS — E1122 Type 2 diabetes mellitus with diabetic chronic kidney disease: Secondary | ICD-10-CM | POA: Diagnosis not present

## 2023-05-24 DIAGNOSIS — I5032 Chronic diastolic (congestive) heart failure: Secondary | ICD-10-CM | POA: Diagnosis not present

## 2023-05-24 DIAGNOSIS — N184 Chronic kidney disease, stage 4 (severe): Secondary | ICD-10-CM | POA: Diagnosis not present

## 2023-05-24 DIAGNOSIS — E034 Atrophy of thyroid (acquired): Secondary | ICD-10-CM | POA: Diagnosis not present

## 2023-05-24 DIAGNOSIS — I130 Hypertensive heart and chronic kidney disease with heart failure and stage 1 through stage 4 chronic kidney disease, or unspecified chronic kidney disease: Secondary | ICD-10-CM | POA: Diagnosis not present

## 2023-05-24 DIAGNOSIS — R5381 Other malaise: Secondary | ICD-10-CM | POA: Diagnosis not present

## 2023-05-24 DIAGNOSIS — M170 Bilateral primary osteoarthritis of knee: Secondary | ICD-10-CM | POA: Diagnosis not present

## 2023-05-24 DIAGNOSIS — B001 Herpesviral vesicular dermatitis: Secondary | ICD-10-CM | POA: Diagnosis not present

## 2023-06-23 ENCOUNTER — Telehealth: Payer: Self-pay | Admitting: *Deleted

## 2023-06-23 NOTE — Telephone Encounter (Signed)
Received fax from [facility] #[phone] stating a Previous request for Prolia was submitted and has been APPROVED. The authorization [id number] is good from 08/16/2023-08/14/2024  Request has been APPROVED and extended for the next Calendar year  Authorization Fax Sent to [place].

## 2023-07-16 ENCOUNTER — Other Ambulatory Visit: Payer: Self-pay | Admitting: Nurse Practitioner

## 2023-07-16 DIAGNOSIS — E034 Atrophy of thyroid (acquired): Secondary | ICD-10-CM

## 2023-09-06 ENCOUNTER — Other Ambulatory Visit: Payer: Self-pay | Admitting: *Deleted

## 2023-09-06 ENCOUNTER — Ambulatory Visit: Payer: Medicare PPO | Admitting: Family

## 2023-09-06 ENCOUNTER — Encounter: Payer: Self-pay | Admitting: Family

## 2023-09-06 VITALS — BP 124/82 | HR 85 | Temp 97.8°F | Resp 17 | Ht 68.0 in | Wt 199.4 lb

## 2023-09-06 DIAGNOSIS — M810 Age-related osteoporosis without current pathological fracture: Secondary | ICD-10-CM

## 2023-09-06 DIAGNOSIS — H6123 Impacted cerumen, bilateral: Secondary | ICD-10-CM | POA: Diagnosis not present

## 2023-09-06 DIAGNOSIS — H903 Sensorineural hearing loss, bilateral: Secondary | ICD-10-CM | POA: Diagnosis not present

## 2023-09-06 MED ORDER — DENOSUMAB 60 MG/ML ~~LOC~~ SOSY
60.0000 mg | PREFILLED_SYRINGE | Freq: Once | SUBCUTANEOUS | Status: AC
  Administered 2023-10-13: 60 mg via SUBCUTANEOUS

## 2023-09-06 NOTE — Progress Notes (Signed)
Provider: Cigi Bega FNP-C  Sharon Seller, NP  Patient Care Team: Sharon Seller, NP as PCP - General (Nurse Practitioner) Laurey Morale, MD as PCP - Advanced Heart Failure (Cardiology) Nyoka Cowden, MD as Consulting Physician (Pulmonary Disease) Laurey Morale, MD as Consulting Physician (Cardiology)  Extended Emergency Contact Information Primary Emergency Contact: [facility] Address: [address] [place], [place] [address] [place] Home Phone: [phone] Mobile Phone: [phone] Relation: Son Secondary Emergency Contact: Farrel [place] Home Phone: [phone] Mobile Phone: [phone] Relation: Daughter  Code Status:  DNR Goals of care: Advanced Directive information    09/06/2023    3:33 PM  Advanced Directives  Does Patient Have a Medical Advance Directive? Yes  Type of Advance Directive Out of facility DNR (pink MOST or yellow form)  Does patient want to make changes to medical advance directive? No - Patient declined  Pre-existing out of facility DNR order (yellow form or pink MOST form) Yellow form placed in chart (order not valid for inpatient use)     Chief Complaint  Patient presents with   Medical Management of Chronic Issues    Ear lavage    Discussed the use of AI scribe software for clinical note transcription with the patient, who gave verbal consent to proceed.  HPI:  Pt is a 89 y.o. female seen today for an acute visit.She has a history of hearing loss and use of hearing aids, presented for a follow-up after she was seen by Audiology was advised to have both ears cleaned due to excessive wax accumulation. The patient reported that the wax build-up had affected her hearing, and she was experiencing progressive hearing loss. She denied any pain, ringing, or drainage from the ears. There were no associated symptoms of respiratory infection, fever, or chills. Here with daughter who  provides additional HPI information   Upon examination after ear lavage , the right ear appeared clear with no signs of infection. However, the left ear still had a small amount of wax, which was removed using curette during the visit. The patient tolerated the procedure well and reported no pain.      Past Medical History:  Diagnosis Date   Arthritis    bilateral knees   Chronic diastolic CHF (congestive heart failure) (HCC)    CKD (chronic kidney disease), stage III (HCC)    Diabetes mellitus without complication (HCC)    Does use hearing aid    Edema    Hyperlipidemia    Hypertension    Hypothyroidism    Pulmonary embolism (HCC)    Urinary incontinence    Past Surgical History:  Procedure Laterality Date   BLADDER SURGERY  2000   Dr.Tannerbaum (Bladder Tact)   CATARACT EXTRACTION  03/2013   Dr.Shapiro   CHOLECYSTECTOMY     EYE SURGERY     bilateral cataracts   PILONIDAL CYST EXCISION  1952   [facility]    right elbow  1999   x 2, still has one piece of metal in it   TONSILLECTOMY AND ADENOIDECTOMY  1937   Dr.Brewer    Allergies  Allergen Reactions   Lisinopril Shortness Of Breath    Tracheal Edema   Penicillins Other (See Comments)    DID THE REACTION INVOLVE: Swelling of the face/tongue/throat, SOB, or low BP? Yes Sudden or severe rash/hives, skin peeling, or the inside of the mouth or nose? No  Did it require medical treatment? No When did it last happen?      more than 10 years  If all above answers are "NO", may proceed with cephalosporin use.   Throat felt tight    Outpatient Encounter Medications as of 09/06/2023  Medication Sig   ACCU-CHEK GUIDE test strip USE DAILY TO TEST BLOOD SUGAR (DX:E11.22)   Accu-Chek Softclix Lancets lancets USE EVERY OTHER DAY TO TEST BLOOD SUGAR   amLODipine (NORVASC) 5 MG tablet TAKE 1 TABLET BY MOUTH EVERY DAY   Cholecalciferol (VITAMIN D3) 5000 UNITS CHEW Chew 5,000 Units by mouth daily.    donepezil (ARICEPT) 10 MG  tablet TAKE 1 TABLET BY MOUTH EVERY DAY AT BEDTIME TO PRESEVER MEMORY   ELIQUIS 5 MG TABS tablet TAKE 1 TABLET BY MOUTH TWICE A DAY   furosemide (LASIX) 20 MG tablet Take 1 tablet (20 mg total) by mouth daily. DX I50.9   insulin detemir (LEVEMIR FLEXTOUCH) 100 UNIT/ML FlexPen Inject 7 Units into the skin daily.   Insulin Pen Needle (BD PEN NEEDLE NANO 2ND GEN) 32G X 4 MM MISC USE EVERY DAY   levothyroxine (SYNTHROID) 88 MCG tablet TAKE 1 TABLET BY MOUTH EVERY DAY BEFORE BREAKFAST   Multiple Vitamin (MULTIVITAMIN WITH MINERALS) TABS tablet Take 1 tablet by mouth daily.   Nutritional Supplements (GLUCERNA SHAKE PO) Take by mouth.   rosuvastatin (CRESTOR) 10 MG tablet TAKE 1 TABLET BY MOUTH EVERY DAY   TRADJENTA 5 MG TABS tablet TAKE 1 TABLET BY MOUTH EVERY DAY   insulin glargine (LANTUS SOLOSTAR) 100 UNIT/ML Solostar Pen Inject 7 Units into the skin daily. (Patient not taking: Reported on 09/06/2023)   No facility-administered encounter medications on file as of 09/06/2023.    Review of Systems  Constitutional:  Negative for appetite change, chills, fatigue, fever and unexpected weight change.  HENT:  Positive for hearing loss. Negative for congestion, dental problem, ear discharge, ear pain, facial swelling, nosebleeds, postnasal drip, rhinorrhea, sinus pressure, sinus pain, sneezing, sore throat, tinnitus and trouble swallowing.   Eyes:  Negative for pain, discharge, redness, itching and visual disturbance.  Respiratory:  Negative for cough, chest tightness, shortness of breath and wheezing.   Cardiovascular:  Negative for chest pain, palpitations and leg swelling.  Musculoskeletal:  Positive for gait problem. Negative for arthralgias and back pain.  Skin:  Negative for color change, pallor and rash.  Neurological:  Negative for dizziness, light-headedness and headaches.    Immunization History  Administered Date(s) Administered   PFIZER(Purple Top)SARS-COV-2 Vaccination 09/21/2019,  10/14/2019, 06/03/2020   Pneumococcal Polysaccharide-23 04/06/2009   Td 04/06/2009   Pertinent  Health Maintenance Due  Topic Date Due   OPHTHALMOLOGY EXAM  07/19/2023   INFLUENZA VACCINE  08/15/2048 (Originally 03/16/2023)   HEMOGLOBIN A1C  09/24/2023   FOOT EXAM  03/23/2024   DEXA SCAN  Completed      08/10/2022    7:41 PM 09/30/2022   10:41 AM 03/24/2023   10:19 AM 03/27/2023   11:07 AM 09/06/2023    3:28 PM  Fall Risk  Falls in the past year?  0 0 0 0  Was there an injury with Fall?  0 0 0 0  Fall Risk Category Calculator  0 0 0 0  (RETIRED) Patient Fall Risk Level Moderate fall risk      Patient at Risk for Falls Due to  No Fall Risks No Fall Risks No Fall Risks   Fall risk Follow up  Falls evaluation completed Falls evaluation  completed Falls evaluation completed    Functional Status Survey:    Vitals:   09/06/23 1537  BP: 124/82  Pulse: 85  Resp: 17  Temp: 97.8 F (36.6 C)  SpO2: 93%  Weight: 199 lb 6.4 oz (90.4 kg)  Height: 5\' 8"  (1.727 m)   Body mass index is 30.32 kg/m. Physical Exam Vitals reviewed.  Constitutional:      General: She is not in acute distress.    Appearance: Normal appearance. She is normal weight. She is not ill-appearing or diaphoretic.  HENT:     Head: Normocephalic.     Right Ear: Tympanic membrane, ear canal and external ear normal. There is impacted cerumen.     Left Ear: Tympanic membrane, ear canal and external ear normal. There is impacted cerumen.     Ears:     Comments: Bilateral ear cerumen lavaged with warm water and hydrogen peroxide moderate amounts of cerumen obtained.small amount cerumen removed using curette on left ear.small red area on canal where cerumen obtain noted.No bleeding.Tolerated procedure well.TM clear without any signs of infection.     Nose: Nose normal. No congestion or rhinorrhea.     Mouth/Throat:     Mouth: Mucous membranes are moist.     Pharynx: Oropharynx is clear. No oropharyngeal exudate or  posterior oropharyngeal erythema.  Eyes:     General: No scleral icterus.       Right eye: No discharge.        Left eye: No discharge.     Extraocular Movements: Extraocular movements intact.     Conjunctiva/sclera: Conjunctivae normal.     Pupils: Pupils are equal, round, and reactive to light.  Neck:     Vascular: No carotid bruit.  Cardiovascular:     Rate and Rhythm: Normal rate and regular rhythm.     Pulses: Normal pulses.     Heart sounds: Normal heart sounds. No murmur heard.    No friction rub. No gallop.  Pulmonary:     Effort: Pulmonary effort is normal. No respiratory distress.     Breath sounds: Normal breath sounds. No wheezing, rhonchi or rales.  Chest:     Chest wall: No tenderness.  Musculoskeletal:     Cervical back: Normal range of motion. No rigidity or tenderness.  Lymphadenopathy:     Cervical: No cervical adenopathy.  Skin:    General: Skin is warm and dry.     Coloration: Skin is not pale.     Findings: No erythema or rash.  Neurological:     Mental Status: She is alert and oriented to person, place, and time.     Sensory: No sensory deficit.     Gait: Gait abnormal.  Psychiatric:        Mood and Affect: Mood normal.        Speech: Speech normal.        Behavior: Behavior normal.     Labs reviewed: Recent Labs    03/24/23 1100  NA 142  K 4.3  CL 104  CO2 27  GLUCOSE 163*  BUN 45*  CREATININE 1.31*  CALCIUM 11.0*   Recent Labs    03/24/23 1100  AST 11  ALT 9  BILITOT 0.5  PROT 6.4   Recent Labs    03/24/23 1100  WBC 9.1  NEUTROABS 5,824  HGB 14.5  HCT 43.9  MCV 90.1  PLT 194   Lab Results  Component Value Date   TSH 1.13 03/24/2023   Lab Results  Component Value Date   HGBA1C 7.4 (H) 03/24/2023   Lab Results  Component Value Date   CHOL 156 03/24/2023   HDL 59 03/24/2023   LDLCALC 74 03/24/2023   TRIG 142 03/24/2023   CHOLHDL 2.6 03/24/2023    Significant Diagnostic Results in last 30 days:  No results  found.  Assessment/Plan  1.Hearing Loss Progressive hearing loss. Hearing aids are being adjusted with new molds. No recent respiratory infections, sinus issues, fever, or chills. - Has Follow-up appointment with Audiology on Tuesday for hearing aid fitting and testing.  2. Cerumen Impaction  Bilateral cerumen impaction noted during hearing aid fitting. Reports hearing loss without pain, tinnitus, or otorrhea. bilateral ear lavage. Left ear clear. Right ear with small piece of wax removed using curette. Explained the procedure might cause a tickling sensation but should not be painful. Discussed complete removal to prevent future complications such as pain or infection. Tolerated procedure well. - Flush right ear to ensure complete removal of cerumen - Advise to report any pain, fever, or signs of infection.   Family/ staff Communication: Reviewed plan of care with patient and daughter verbalized understanding   Labs/tests ordered: None   Next Appointment: Return in about 7 months (around 03/26/2024) for medical mangement of chronic issues., fasting labs prior to visit with Wyatt Mage .   Caesar Bookman, NP

## 2023-09-17 ENCOUNTER — Other Ambulatory Visit: Payer: Self-pay | Admitting: Nurse Practitioner

## 2023-10-02 ENCOUNTER — Telehealth: Payer: Self-pay | Admitting: *Deleted

## 2023-10-02 DIAGNOSIS — M810 Age-related osteoporosis without current pathological fracture: Secondary | ICD-10-CM

## 2023-10-02 NOTE — Telephone Encounter (Signed)
Patient has upcoming appointment for Prolia Injection on 10/13/2023 (Rescheduled from 2/21 due to no labs)  Lab Appointment scheduled for 10/09/2023 Please place labs orders for patient to get Prolia Injection.

## 2023-10-02 NOTE — Telephone Encounter (Signed)
 Lab order placed.

## 2023-10-04 ENCOUNTER — Other Ambulatory Visit: Payer: Self-pay | Admitting: Nurse Practitioner

## 2023-10-04 DIAGNOSIS — R413 Other amnesia: Secondary | ICD-10-CM

## 2023-10-04 DIAGNOSIS — I509 Heart failure, unspecified: Secondary | ICD-10-CM

## 2023-10-06 ENCOUNTER — Ambulatory Visit: Payer: Medicare PPO

## 2023-10-09 ENCOUNTER — Other Ambulatory Visit: Payer: Medicare PPO

## 2023-10-09 DIAGNOSIS — Z794 Long term (current) use of insulin: Secondary | ICD-10-CM | POA: Diagnosis not present

## 2023-10-09 DIAGNOSIS — E1122 Type 2 diabetes mellitus with diabetic chronic kidney disease: Secondary | ICD-10-CM | POA: Diagnosis not present

## 2023-10-09 DIAGNOSIS — N1832 Chronic kidney disease, stage 3b: Secondary | ICD-10-CM | POA: Diagnosis not present

## 2023-10-09 DIAGNOSIS — M810 Age-related osteoporosis without current pathological fracture: Secondary | ICD-10-CM

## 2023-10-09 DIAGNOSIS — I10 Essential (primary) hypertension: Secondary | ICD-10-CM | POA: Diagnosis not present

## 2023-10-10 LAB — CBC WITH DIFFERENTIAL/PLATELET
Absolute Lymphocytes: 2804 {cells}/uL (ref 850–3900)
Absolute Monocytes: 662 {cells}/uL (ref 200–950)
Basophils Absolute: 60 {cells}/uL (ref 0–200)
Basophils Relative: 0.7 %
Eosinophils Absolute: 258 {cells}/uL (ref 15–500)
Eosinophils Relative: 3 %
HCT: 46.5 % — ABNORMAL HIGH (ref 35.0–45.0)
Hemoglobin: 15 g/dL (ref 11.7–15.5)
MCH: 30.2 pg (ref 27.0–33.0)
MCHC: 32.3 g/dL (ref 32.0–36.0)
MCV: 93.6 fL (ref 80.0–100.0)
MPV: 12.3 fL (ref 7.5–12.5)
Monocytes Relative: 7.7 %
Neutro Abs: 4816 {cells}/uL (ref 1500–7800)
Neutrophils Relative %: 56 %
Platelets: 194 10*3/uL (ref 140–400)
RBC: 4.97 10*6/uL (ref 3.80–5.10)
RDW: 12.6 % (ref 11.0–15.0)
Total Lymphocyte: 32.6 %
WBC: 8.6 10*3/uL (ref 3.8–10.8)

## 2023-10-10 LAB — COMPLETE METABOLIC PANEL WITH GFR
AG Ratio: 1.7 (calc) (ref 1.0–2.5)
ALT: 17 U/L (ref 6–29)
AST: 18 U/L (ref 10–35)
Albumin: 3.9 g/dL (ref 3.6–5.1)
Alkaline phosphatase (APISO): 58 U/L (ref 37–153)
BUN/Creatinine Ratio: 25 (calc) — ABNORMAL HIGH (ref 6–22)
BUN: 30 mg/dL — ABNORMAL HIGH (ref 7–25)
CO2: 32 mmol/L (ref 20–32)
Calcium: 10.8 mg/dL — ABNORMAL HIGH (ref 8.6–10.4)
Chloride: 103 mmol/L (ref 98–110)
Creat: 1.22 mg/dL — ABNORMAL HIGH (ref 0.60–0.95)
Globulin: 2.3 g/dL (ref 1.9–3.7)
Glucose, Bld: 138 mg/dL — ABNORMAL HIGH (ref 65–99)
Potassium: 4.4 mmol/L (ref 3.5–5.3)
Sodium: 143 mmol/L (ref 135–146)
Total Bilirubin: 0.6 mg/dL (ref 0.2–1.2)
Total Protein: 6.2 g/dL (ref 6.1–8.1)
eGFR: 41 mL/min/{1.73_m2} — ABNORMAL LOW (ref >=60)

## 2023-10-10 LAB — HEMOGLOBIN A1C
Hgb A1c MFr Bld: 7.4 %{Hb} — ABNORMAL HIGH (ref <5.7)
Mean Plasma Glucose: 166 mg/dL
eAG (mmol/L): 9.2 mmol/L

## 2023-10-13 ENCOUNTER — Ambulatory Visit: Payer: Medicare PPO

## 2023-10-13 DIAGNOSIS — M810 Age-related osteoporosis without current pathological fracture: Secondary | ICD-10-CM

## 2023-10-13 MED ORDER — DENOSUMAB 60 MG/ML ~~LOC~~ SOSY
60.0000 mg | PREFILLED_SYRINGE | SUBCUTANEOUS | Status: AC
  Administered 2024-04-19: 60 mg via SUBCUTANEOUS

## 2023-10-20 ENCOUNTER — Other Ambulatory Visit: Payer: Self-pay | Admitting: Nurse Practitioner

## 2023-10-20 DIAGNOSIS — N1832 Chronic kidney disease, stage 3b: Secondary | ICD-10-CM

## 2023-10-20 NOTE — Telephone Encounter (Signed)
 Pharmacy requested refill.  Pended Rx and sent to [facility] for approval.

## 2023-10-20 NOTE — Telephone Encounter (Signed)
 Pt needs follow up appt please call and schedule

## 2023-11-15 ENCOUNTER — Encounter (HOSPITAL_COMMUNITY): Payer: Self-pay

## 2023-11-15 ENCOUNTER — Ambulatory Visit (HOSPITAL_COMMUNITY)
Admission: RE | Admit: 2023-11-15 | Discharge: 2023-11-15 | Disposition: A | Discharge status: Home / Self Care | Source: Ambulatory Visit | Attending: Family Medicine | Admitting: Family Medicine

## 2023-11-15 ENCOUNTER — Telehealth (HOSPITAL_COMMUNITY): Payer: Self-pay

## 2023-11-15 VITALS — BP 128/74 | HR 86 | Wt 195.4 lb

## 2023-11-15 DIAGNOSIS — E785 Hyperlipidemia, unspecified: Secondary | ICD-10-CM

## 2023-11-15 DIAGNOSIS — I350 Nonrheumatic aortic (valve) stenosis: Secondary | ICD-10-CM | POA: Diagnosis not present

## 2023-11-15 DIAGNOSIS — I10 Essential (primary) hypertension: Secondary | ICD-10-CM

## 2023-11-15 DIAGNOSIS — N183 Chronic kidney disease, stage 3 unspecified: Secondary | ICD-10-CM | POA: Diagnosis not present

## 2023-11-15 DIAGNOSIS — I5032 Chronic diastolic (congestive) heart failure: Secondary | ICD-10-CM

## 2023-11-15 DIAGNOSIS — Z7984 Long term (current) use of oral hypoglycemic drugs: Secondary | ICD-10-CM | POA: Diagnosis not present

## 2023-11-15 DIAGNOSIS — E1122 Type 2 diabetes mellitus with diabetic chronic kidney disease: Secondary | ICD-10-CM | POA: Insufficient documentation

## 2023-11-15 DIAGNOSIS — I130 Hypertensive heart and chronic kidney disease with heart failure and stage 1 through stage 4 chronic kidney disease, or unspecified chronic kidney disease: Secondary | ICD-10-CM | POA: Insufficient documentation

## 2023-11-15 DIAGNOSIS — Z794 Long term (current) use of insulin: Secondary | ICD-10-CM | POA: Insufficient documentation

## 2023-11-15 DIAGNOSIS — Z79899 Other long term (current) drug therapy: Secondary | ICD-10-CM | POA: Insufficient documentation

## 2023-11-15 DIAGNOSIS — Z86711 Personal history of pulmonary embolism: Secondary | ICD-10-CM | POA: Diagnosis not present

## 2023-11-15 NOTE — Progress Notes (Signed)
 Patient ID: Melissa Shannon, female   DOB: August 30, 1927, 89 y.o.   MRN: [id number] PCP: Dr. Glade Lloyd Cardiology: Dr. Shirlee Latch  89 y.o. with history of HTN, DM, and immobility due to osteoarthritis presents for followup of right heart failure and PE.  At baseline, patient is not very active.  She has knee arthritis that is very limiting.  She does not do much walking and uses a walker to get around.  She has been very limited for a few years now.  She was admitted in 10/15 with dyspnea.  The dyspnea began about 2-3 days prior to admission.  She would get short of breath after walking about 20 feet with her walker.  She was noted to be in CHF at admission with troponin elevated to 0.6.  Echo showed normal LV size and systolic function but severely dilated/dysfunctional RV with pulmonary hypertension.  V/Q scan was done and showed intermediate probability for PE.  She was seen by pulmonary and thought to have a PE.  Apixaban was started.  She was diuresed with IV Lasix.  Repeat echo was done 3/16.  This showed EF 55-60%, normal RV size and systolic function, and PA systolic pressure 28 mmHg.  V/Q scan in 5/16 was normal.   She was admitted in 4/16 with altered mental status, likely due to E coli urosepsis.   She was admitted in 1/20 after getting tracheal edema likely as an ACEI reaction.  Echo in 1/20 showed EF 55-60% with mild AS and normal RV size and systolic function.  Echo in 1/21 showed EF 55-60%, mild-moderate AS with mean gradient 13 and AVA 1.3 cm^2, mild MR.  Echo in 2/22 showed EF 55-60%, mild AS, normal RV, PASP 19, IVC normal.   Echo 2/24 showed EF 65-70%, normal RV, mild to moderate AS.  Today she returns for HF follow up with her daughter. Overall feeling fine. She is mostly wheelchair confined but able to walk around the house with a walker. She is SOB if she over does it with ADLs. Denies palpitations, abnormal bleeding, CP, dizziness, edema, or PND/Orthopnea. Appetite ok. No fever or chills.  Taking all medications.   ECG (personally reviewed):  NSR 76 bpm  Labs (6/23): LDL 83, HDL 57 Labs (12/23): K 3.8, creatinine 1.34 Labs (8/24): LDL 74 Labs (2/25): K 4.4, creatinine 1.22, hgb 15.0  PMH: 1. HTN - tracheal edema with ACEI.  2. Hyperlipidemia 3. Osteoarthritis: Involving knees, uses walker.  4. Type II diabetes 5. H/o CCY 6. CKD stage III 7. Hypothyroidism 8. PE: Admitted 10/15 with dyspnea/CHF.  Echo (10/15) with EF 60-65%, D-shaped interventricular septum, severely dilated RV with moderately decreased RV systolic function, PA systolic pressure 47 mmHg.  V/Q scan (10/15) was intermediate probability for PE. Patient was started on apixaban.  Echo (3/16) with EF 55-60%, basal inferior hypokinesis, normal RV size and systolic function, PA systolic pressure 28 mmHg. V/Q scan 5/16 was normal.  - Echo (1/20): EF 55-60%, mild AS, normal RV size and systolic function.  - Echo (1/21): EF 55-60%, mild-moderate AS with mean gradient 13 and AVA 1.3 cm^2, mild MR. - Echo (2/22): EF 55-60%, mild AS, normal RV, PASP 19, IVC normal. - Echo 2/24 showed EF 65-70%, normal RV, mild to moderate AS with mean gradient 15.3 mmHg and AVA 1.29 cm2  9. Nodules on CT chest 10/15: Per Dr Sherene Sires, likely benign findings.  10. GERD 11. Aortic stenosis: Mild on 1/20 echo (mean gradient 13).  Mild-moderate on 1/21 echo.  Mild on 2/22 echo.   FH: No h/o venous thromboembolism, no heart problems that she knows of.   SH: Widow, lives alone, nonsmoker, 3 kids.   ROS: All systems reviewed and negative except as per HPI.   Current Outpatient Medications  Medication Sig Dispense Refill   ACCU-CHEK GUIDE test strip USE DAILY TO TEST BLOOD SUGAR (DX:E11.22) 100 strip 11   Accu-Chek Softclix Lancets lancets USE EVERY OTHER DAY TO TEST BLOOD SUGAR 100 each 3   amLODipine (NORVASC) 5 MG tablet TAKE 1 TABLET BY MOUTH EVERY DAY 90 tablet 2   Cholecalciferol (VITAMIN D3) 5000 UNITS CHEW Chew 5,000 Units by mouth  daily.      donepezil (ARICEPT) 10 MG tablet TAKE 1 TABLET BY MOUTH EVERY DAY AT BEDTIME TO PRESERVE MEMORY 90 tablet 1   ELIQUIS 5 MG TABS tablet TAKE 1 TABLET BY MOUTH TWICE A DAY 180 tablet 3   furosemide (LASIX) 20 MG tablet TAKE 1 TABLET EVERY DAY 90 tablet 3   insulin glargine (LANTUS SOLOSTAR) 100 UNIT/ML Solostar Pen INJECT 7 UNITS INTO THE SKIN DAILY. 3 mL 0   Insulin Pen Needle (BD PEN NEEDLE NANO 2ND GEN) 32G X 4 MM MISC USE EVERY DAY AS DIRECTED 100 each 11   levothyroxine (SYNTHROID) 88 MCG tablet TAKE 1 TABLET BY MOUTH EVERY DAY BEFORE BREAKFAST 90 tablet 3   Multiple Vitamin (MULTIVITAMIN WITH MINERALS) TABS tablet Take 1 tablet by mouth daily. 30 tablet 0   Nutritional Supplements (GLUCERNA SHAKE PO) Take by mouth.     rosuvastatin (CRESTOR) 10 MG tablet TAKE 1 TABLET BY MOUTH EVERY DAY 90 tablet 3   TRADJENTA 5 MG TABS tablet TAKE 1 TABLET BY MOUTH EVERY DAY 90 tablet 1   Current Facility-Administered Medications  Medication Dose Route Frequency Provider Last Rate Last Admin   [START ON 04/15/2024] denosumab (PROLIA) injection 60 mg  60 mg Subcutaneous Q6 months Eubanks, Hydia Copelin K, NP       BP 128/74   Pulse 86   Wt 88.6 kg (195 lb 6.4 oz)   SpO2 94%   BMI 29.71 kg/m   Physical Exam General:  NAD. No resp difficulty, arrived in [facility], elderly HEENT: Normal Neck: Supple. No JVD. Cor: Regular rate & rhythm. No rubs, gallops or murmurs. Lungs: Clear Abdomen: Soft, nontender, nondistended.  Extremities: No cyanosis, clubbing, rash, chronic BLE lymphedema Neuro: Alert & oriented x 3, moves all 4 extremities w/o difficulty. Affect pleasant.  Assessment/Plan 1. Right heart failure: Echo on 10/15 showed dilated RV with decreased systolic function.  V/Q scan with suspected PE. 1/20 echo showed normal RV size and systolic function with normal estimate PA systolic pressure.  5/16 V/Q scan showed no PE.  Most recent echo in 1/21 showed normal RV function by report.  Echo 2.24 showed  EF 65-70%, normal RV. She is not volume overloaded by exam. NYHA class III symptoms, suspect obesity and deconditioning play a major role in her dyspnea.  - Continue Lasix 20 mg daily. Labs reviewed from 10/09/23 and are stable, K 4.4, SCr 1.22 2. PE: V/Q scan intermediate probability for PE in 10/15.  Pulmonary saw and suspect that she did indeed have PE.  The PE was spontaneous.  She is at risk given general immobility. No bleeding problems.  She did not have evidence for chronic PE on 5/16 V/Q scan.   - Recommend that she continue apixaban long-term to decrease risk as she will likely remain fairly immobile long-term due to  her arthritis. Hgb stable at 15 on CBC 10/09/23 3. HTN: BP reasonably controlled.    - Continue amlodipine 5 mg daily. 4. Hyperlipidemia: Continue Crestor, LDL 74 on labs 03/24/23 5. Aortic stenosis: Mild to moderate on last echo 2/24 - Continue to follow    Followup in 6 months with Dr. Shirlee Latch.  [facility] FNP-BC 11/15/2023

## 2023-11-15 NOTE — Telephone Encounter (Signed)
 Called to confirm/remind patient of their appointment at the [facility] on 11/15/23.   Appointment:   [] Confirmed  [] Left mess   [x] No answer/No voice mail  [] Phone not in service

## 2023-11-15 NOTE — Patient Instructions (Addendum)
 Good to see you today!  No  medication changes were made ,continue as prescribed  Your physician recommends that you schedule a follow-up appointment :6 months with Dr. Rodell Perna) Call office in August to schedule an appointment.  If you have any questions or concerns before your next appointment please send [place] a message through [place] or call our office at [phone].    TO LEAVE A MESSAGE FOR THE NURSE SELECT OPTION 2, PLEASE LEAVE A MESSAGE INCLUDING: YOUR NAME DATE OF BIRTH CALL BACK NUMBER REASON FOR CALL**this is important as we prioritize the call backs  YOU WILL RECEIVE A CALL BACK THE SAME DAY AS LONG AS YOU CALL BEFORE 4:00 PM At the [facility], you and your health needs are our priority. As part of our continuing mission to provide you with exceptional heart care, we have created designated Provider Care Teams. These Care Teams include your primary Cardiologist (physician) and Advanced Practice Providers (APPs- Physician Assistants and Nurse Practitioners) who all work together to provide you with the care you need, when you need it.   You may see any of the following providers on your designated Care Team at your next follow up: Dr Arvilla Meres Dr Marca Ancona Dr. Dorthula Nettles Dr. Clearnce Hasten Amy Filbert Schilder, NP Robbie Lis, [place] [facility] [place], [place] Brynda Peon, NP [place] Lee, NP Clarisa Kindred, NP Karle Plumber, PharmD Enos Fling, PharmD   Please be sure to bring in all your medications bottles to every appointment.    Thank you for choosing [facility]

## 2023-12-08 DIAGNOSIS — L821 Other seborrheic keratosis: Secondary | ICD-10-CM | POA: Diagnosis not present

## 2023-12-08 DIAGNOSIS — D1801 Hemangioma of skin and subcutaneous tissue: Secondary | ICD-10-CM | POA: Diagnosis not present

## 2023-12-08 DIAGNOSIS — L720 Epidermal cyst: Secondary | ICD-10-CM | POA: Diagnosis not present

## 2023-12-08 DIAGNOSIS — Z85828 Personal history of other malignant neoplasm of skin: Secondary | ICD-10-CM | POA: Diagnosis not present

## 2023-12-08 DIAGNOSIS — L309 Dermatitis, unspecified: Secondary | ICD-10-CM | POA: Diagnosis not present

## 2023-12-08 DIAGNOSIS — L570 Actinic keratosis: Secondary | ICD-10-CM | POA: Diagnosis not present

## 2023-12-16 ENCOUNTER — Other Ambulatory Visit: Payer: Self-pay | Admitting: Nurse Practitioner

## 2023-12-30 ENCOUNTER — Other Ambulatory Visit: Payer: Self-pay | Admitting: Nurse Practitioner

## 2024-01-13 ENCOUNTER — Other Ambulatory Visit: Payer: Self-pay | Admitting: Nurse Practitioner

## 2024-02-28 DIAGNOSIS — L603 Nail dystrophy: Secondary | ICD-10-CM | POA: Diagnosis not present

## 2024-02-28 DIAGNOSIS — Z85828 Personal history of other malignant neoplasm of skin: Secondary | ICD-10-CM | POA: Diagnosis not present

## 2024-03-07 ENCOUNTER — Telehealth: Payer: Self-pay | Admitting: *Deleted

## 2024-03-07 NOTE — Telephone Encounter (Signed)
 Received Verification from [employer] for Prolia  [employer] #: [phone]  Copay $80, PA Approved through [facility] 08/16/2023-08/14/2024 Ref#: [id number]

## 2024-03-22 ENCOUNTER — Other Ambulatory Visit: Payer: Self-pay | Admitting: Nurse Practitioner

## 2024-03-22 DIAGNOSIS — E034 Atrophy of thyroid (acquired): Secondary | ICD-10-CM

## 2024-03-22 DIAGNOSIS — N1832 Chronic kidney disease, stage 3b: Secondary | ICD-10-CM

## 2024-03-22 DIAGNOSIS — I10 Essential (primary) hypertension: Secondary | ICD-10-CM

## 2024-03-25 ENCOUNTER — Encounter: Payer: Self-pay | Admitting: Nurse Practitioner

## 2024-03-25 ENCOUNTER — Ambulatory Visit (INDEPENDENT_AMBULATORY_CARE_PROVIDER_SITE_OTHER): Payer: Medicare PPO | Admitting: Nurse Practitioner

## 2024-03-25 ENCOUNTER — Other Ambulatory Visit: Payer: Medicare PPO

## 2024-03-25 VITALS — BP 126/82 | HR 79 | Temp 97.7°F | Resp 13 | Ht 68.0 in

## 2024-03-25 DIAGNOSIS — E034 Atrophy of thyroid (acquired): Secondary | ICD-10-CM | POA: Diagnosis not present

## 2024-03-25 DIAGNOSIS — N1832 Chronic kidney disease, stage 3b: Secondary | ICD-10-CM

## 2024-03-25 DIAGNOSIS — Z794 Long term (current) use of insulin: Secondary | ICD-10-CM

## 2024-03-25 DIAGNOSIS — Z0000 Encounter for general adult medical examination without abnormal findings: Secondary | ICD-10-CM

## 2024-03-25 DIAGNOSIS — I10 Essential (primary) hypertension: Secondary | ICD-10-CM | POA: Diagnosis not present

## 2024-03-25 DIAGNOSIS — E1122 Type 2 diabetes mellitus with diabetic chronic kidney disease: Secondary | ICD-10-CM

## 2024-03-25 LAB — CBC WITH DIFFERENTIAL/PLATELET
Absolute Lymphocytes: 2706 {cells}/uL (ref 850–3900)
Absolute Monocytes: 589 {cells}/uL (ref 200–950)
Basophils Absolute: 58 {cells}/uL (ref 0–200)
Basophils Relative: 0.7 %
Eosinophils Absolute: 232 {cells}/uL (ref 15–500)
Eosinophils Relative: 2.8 %
HCT: 47.4 % — ABNORMAL HIGH (ref 35.0–45.0)
Hemoglobin: 14.4 g/dL (ref 11.7–15.5)
MCH: 29.1 pg (ref 27.0–33.0)
MCHC: 30.4 g/dL — ABNORMAL LOW (ref 32.0–36.0)
MCV: 96 fL (ref 80.0–100.0)
MPV: 11.5 fL (ref 7.5–12.5)
Monocytes Relative: 7.1 %
Neutro Abs: 4714 {cells}/uL (ref 1500–7800)
Neutrophils Relative %: 56.8 %
Platelets: 241 Thousand/uL (ref 140–400)
RBC: 4.94 Million/uL (ref 3.80–5.10)
RDW: 13.9 % (ref 11.0–15.0)
Total Lymphocyte: 32.6 %
WBC: 8.3 Thousand/uL (ref 3.8–10.8)

## 2024-03-25 LAB — COMPREHENSIVE METABOLIC PANEL WITH GFR
AG Ratio: 1.7 (calc) (ref 1.0–2.5)
ALT: 12 U/L (ref 6–29)
AST: 15 U/L (ref 10–35)
Albumin: 4 g/dL (ref 3.6–5.1)
Alkaline phosphatase (APISO): 54 U/L (ref 37–153)
BUN/Creatinine Ratio: 24 (calc) — ABNORMAL HIGH (ref 6–22)
BUN: 31 mg/dL — ABNORMAL HIGH (ref 7–25)
CO2: 30 mmol/L (ref 20–32)
Calcium: 10.4 mg/dL (ref 8.6–10.4)
Chloride: 103 mmol/L (ref 98–110)
Creat: 1.3 mg/dL — ABNORMAL HIGH (ref 0.60–0.95)
Globulin: 2.3 g/dL (ref 1.9–3.7)
Glucose, Bld: 141 mg/dL — ABNORMAL HIGH (ref 65–99)
Potassium: 4 mmol/L (ref 3.5–5.3)
Sodium: 142 mmol/L (ref 135–146)
Total Bilirubin: 0.6 mg/dL (ref 0.2–1.2)
Total Protein: 6.3 g/dL (ref 6.1–8.1)
eGFR: 38 mL/min/1.73m2 — ABNORMAL LOW (ref >=60)

## 2024-03-25 LAB — LIPID PANEL
Cholesterol: 165 mg/dL (ref <200)
HDL: 62 mg/dL (ref >=50)
LDL Cholesterol (Calc): 78 mg/dL
Non-HDL Cholesterol (Calc): 103 mg/dL (ref <130)
Total CHOL/HDL Ratio: 2.7 (calc) (ref <5.0)
Triglycerides: 148 mg/dL (ref <150)

## 2024-03-25 LAB — TSH: TSH: 2.02 m[IU]/L (ref 0.40–4.50)

## 2024-03-25 LAB — HEMOGLOBIN A1C
Hgb A1c MFr Bld: 7.1 % — ABNORMAL HIGH (ref <5.7)
Mean Plasma Glucose: 157 mg/dL
eAG (mmol/L): 8.7 mmol/L

## 2024-03-25 NOTE — Progress Notes (Signed)
 Subjective:   Melissa Shannon is a 89 y.o. female who presents for Medicare Annual (Subsequent) preventive examination.  Visit Complete: In person [facility] clinic         Objective:    Today's Vitals   03/25/24 1000  BP: 126/82  Pulse: 79  Resp: 13  Temp: 97.7 F (36.5 C)  SpO2: 93%  Height: 5' 8 (1.727 m)   Body mass index is 29.71 kg/m.     09/06/2023    3:33 PM 03/27/2023   11:07 AM 03/24/2023   10:21 AM 08/10/2022    7:41 PM 04/22/2022   10:29 AM 03/17/2022   12:52 PM 09/10/2021   10:28 AM  Advanced Directives  Does Patient Have a Medical Advance Directive? Yes Yes Yes No Yes Yes Yes  Type of Advance Directive Out of facility DNR (pink MOST or yellow form) Out of facility DNR (pink MOST or yellow form) Out of facility DNR (pink MOST or yellow form)  Out of facility DNR (pink MOST or yellow form) Out of facility DNR (pink MOST or yellow form) Out of facility DNR (pink MOST or yellow form)  Does patient want to make changes to medical advance directive? No - Patient declined No - Patient declined No - Patient declined  No - Patient declined No - Patient declined No - Patient declined  Pre-existing out of facility DNR order (yellow form or pink MOST form) Yellow form placed in chart (order not valid for inpatient use) Yellow form placed in chart (order not valid for inpatient use) Yellow form placed in chart (order not valid for inpatient use)   Yellow form placed in chart (order not valid for inpatient use) Yellow form placed in chart (order not valid for inpatient use)    Current Medications (verified) Outpatient Encounter Medications as of 03/25/2024  Medication Sig   ACCU-CHEK GUIDE test strip USE DAILY TO TEST BLOOD SUGAR (DX:E11.22)   Accu-Chek Softclix Lancets lancets USE EVERY OTHER DAY TO TEST BLOOD SUGAR   amLODipine  (NORVASC ) 5 MG tablet TAKE 1 TABLET BY MOUTH EVERY DAY   BD PEN NEEDLE NANO 2ND GEN 32G X 4 MM MISC USE EVERY DAY AS DIRECTED   Cholecalciferol   (VITAMIN D3) 5000 UNITS CHEW Chew 5,000 Units by mouth daily.    donepezil  (ARICEPT ) 10 MG tablet TAKE 1 TABLET BY MOUTH EVERY DAY AT BEDTIME TO PRESERVE MEMORY   ELIQUIS  5 MG TABS tablet TAKE 1 TABLET BY MOUTH TWICE A DAY   furosemide  (LASIX ) 20 MG tablet TAKE 1 TABLET EVERY DAY   insulin  glargine (LANTUS  SOLOSTAR) 100 UNIT/ML Solostar Pen INJECT 7 UNITS INTO THE SKIN DAILY.   levothyroxine  (SYNTHROID ) 88 MCG tablet TAKE 1 TABLET BY MOUTH EVERY DAY BEFORE BREAKFAST   Multiple Vitamin (MULTIVITAMIN WITH MINERALS) TABS tablet Take 1 tablet by mouth daily.   mupirocin ointment (BACTROBAN) 2 % Apply 1 Application topically daily.   Nutritional Supplements (GLUCERNA SHAKE PO) Take by mouth.   rosuvastatin  (CRESTOR ) 10 MG tablet TAKE 1 TABLET BY MOUTH EVERY DAY   TRADJENTA  5 MG TABS tablet TAKE 1 TABLET BY MOUTH EVERY DAY   Facility-Administered Encounter Medications as of 03/25/2024  Medication   [START ON 04/15/2024] denosumab  (PROLIA ) injection 60 mg    Allergies (verified) Lisinopril  and Penicillins   History: Past Medical History:  Diagnosis Date   Arthritis    bilateral knees   Chronic diastolic CHF (congestive heart failure) (HCC)    CKD (chronic kidney disease), stage III (HCC)  Diabetes mellitus without complication (HCC)    Does use hearing aid    Edema    Hyperlipidemia    Hypertension    Hypothyroidism    Pulmonary embolism (HCC)    Urinary incontinence    Past Surgical History:  Procedure Laterality Date   BLADDER SURGERY  2000   Dr.Tannerbaum (Bladder Tact)   CATARACT EXTRACTION  03/2013   Dr.Shapiro   CHOLECYSTECTOMY     EYE SURGERY     bilateral cataracts   PILONIDAL CYST EXCISION  1952   [facility]    right elbow  1999   x 2, still has one piece of metal in it   TONSILLECTOMY AND ADENOIDECTOMY  1937   Dr.Brewer   Family History  Problem Relation Age of Onset   Suicidality Father    Cancer Brother    Cancer Maternal Grandfather    Diabetes  Maternal Grandmother    Melanoma Brother    Social History   Socioeconomic History   Marital status: Widowed    Spouse name: Not on file   Number of children: Not on file   Years of education: Not on file   Highest education level: Not on file  Occupational History   Occupation: Retired Runner, broadcasting/film/video  Tobacco Use   Smoking status: Never   Smokeless tobacco: Never  Vaping Use   Vaping status: Never Used  Substance and Sexual Activity   Alcohol  use: No   Drug use: No   Sexual activity: Never  Other Topics Concern   Not on file  Social History Narrative   Diet: Low sodium    Do you drink/eat things with caffeine? Yes, tea   Widowed, married 1952   Lives in a house, yes- 1 or more stories, 1 person in home. No pets    Current/past profession- Teacher    Patient exercises with walker twice daily        Social Drivers of Health   Financial Resource Strain: Low Risk  (02/23/2018)   Overall Financial Resource Strain (CARDIA)    Difficulty of Paying Living Expenses: Not hard at all  Food Insecurity: No Food Insecurity (06/09/2022)   Hunger Vital Sign    Worried About Running Out of Food in the Last Year: Never true    Ran Out of Food in the Last Year: Never true  Transportation Needs: No Transportation Needs (06/09/2022)   PRAPARE - Administrator, Civil Service (Medical): No    Lack of Transportation (Non-Medical): No  Physical Activity: Insufficiently Active (02/23/2018)   Exercise Vital Sign    Days of Exercise per Week: 7 days    Minutes of Exercise per Session: 20 min  Stress: No Stress Concern Present (02/23/2018)   [employer] of Occupational Health - Occupational Stress Questionnaire    Feeling of Stress : Not at all  Social Connections: Moderately Isolated (02/23/2018)   Social Connection and Isolation Panel    Frequency of Communication with Friends and Family: Three times a week    Frequency of Social Gatherings with Friends and Family: Once a week     Attends Religious Services: Never    Database administrator or Organizations: No    Attends Banker Meetings: Never    Marital Status: Widowed    Tobacco Counseling Counseling given: Not Answered   Clinical Intake:                        Activities  of Daily Living     No data to display          Patient Care Team: Caro Harlene POUR, NP as PCP - General (Nurse Practitioner) Rolan Ezra RAMAN, MD as PCP - Advanced Heart Failure (Cardiology) Darlean Ozell NOVAK, MD as Consulting Physician (Pulmonary Disease) Rolan Ezra RAMAN, MD as Consulting Physician (Cardiology)  Indicate any recent Medical Services you may have received from other than [facility] providers in the past year (date may be approximate).     Assessment:   This is a routine wellness examination for [place].  Hearing/Vision screen Hearing Screening - Comments:: Patient stated hearing has been a &quot;mess' Vision Screening - Comments:: Good   Goals Addressed   None    Depression Screen    03/25/2024   10:02 AM 09/06/2023    3:28 PM 03/24/2023   10:19 AM 03/17/2022   12:55 PM 01/17/2022   10:08 AM 03/10/2021    1:38 PM 07/03/2020    9:21 AM  PHQ 2/9 Scores  PHQ - 2 Score 0 0 0 0 0 0 0    Fall Risk    03/25/2024   10:02 AM 09/06/2023    3:28 PM 03/27/2023   11:07 AM 03/24/2023   10:19 AM 09/30/2022   10:41 AM  Fall Risk   Falls in the past year? 0 0 0 0 0  Number falls in past yr: 0 0 0 0 0  Injury with Fall? 0 0 0 0 0  Risk for fall due to : No Fall Risks  No Fall Risks No Fall Risks No Fall Risks  Follow up Falls evaluation completed  Falls evaluation completed Falls evaluation completed Falls evaluation completed    MEDICARE RISK AT HOME:    TIMED UP AND GO:  Was the test performed?  No    Cognitive Function:    03/24/2023   10:25 AM 02/27/2019   10:33 AM 02/23/2018   10:15 AM 02/17/2017   11:06 AM 02/11/2016   11:00 AM  MMSE - Mini Mental State Exam  Orientation to time 5 5 5  5  5    Orientation to Place 4 5 3 4  5    Registration 3 3 3 3  3    Attention/ Calculation 5 5 5 5  5    Recall 2 3 2 1  2    Language- name 2 objects 2 2 2 2  2    Language- repeat 1 1 1 1 1   Language- follow 3 step command 3 3 3 2  3    Language- read & follow direction 1 1 1 1  1    Write a sentence 1 1 1 1  1    Copy design 1 1 1 1  1    Total score 28 30 27 26  29       Data saved with a previous flowsheet row definition        03/25/2024   10:03 AM 03/17/2022   12:56 PM 03/10/2021    1:40 PM 03/06/2020    3:43 PM  6CIT Screen  What Year? 0 points 0 points 0 points 0 points  What month? 0 points 0 points 0 points 0 points  What time? 0 points 0 points 0 points 0 points  Count back from 20 0 points 0 points 0 points 0 points  Months in reverse 0 points 0 points 0 points 0 points  Repeat phrase 2 points 0 points 0 points 2 points  Total Score 2 points 0  points 0 points 2 points    Immunizations Immunization History  Administered Date(s) Administered   PFIZER(Purple Top)SARS-COV-2 Vaccination 09/21/2019, 10/14/2019, 06/03/2020   Pneumococcal Polysaccharide-23 04/06/2009   Td 04/06/2009    TDAP status: Up to date  Flu Vaccine status: Due, but declines Education has been provided regarding the importance of this vaccine. Advised may receive this vaccine at local pharmacy or Health Dept. Aware to provide a copy of the vaccination record if obtained from local pharmacy or Health Dept. Verbalized acceptance and understanding.  Pneumococcal vaccine status: Declined,  Education has been provided regarding the importance of this vaccine but patient still declined. Advised may receive this vaccine at local pharmacy or Health Dept. Aware to provide a copy of the vaccination record if obtained from local pharmacy or Health Dept. Verbalized acceptance and understanding.   Covid-19 vaccine status: Information provided on how to obtain vaccines.   Qualifies for Shingles Vaccine? Yes   Zostavax  completed No   Shingrix Completed?: No.    Education has been provided regarding the importance of this vaccine. Patient has been advised to call insurance company to determine out of pocket expense if they have not yet received this vaccine. Advised may also receive vaccine at local pharmacy or Health Dept. Verbalized acceptance and understanding.  Screening Tests Health Maintenance  Topic Date Due   Pneumococcal Vaccine: 50+ Years (2 of 2 - PCV) 04/06/2010   COVID-19 Vaccine (4 - 2024-25 season) 04/16/2023   OPHTHALMOLOGY EXAM  07/19/2023   FOOT EXAM  03/23/2024   INFLUENZA VACCINE  08/15/2048 (Originally 03/15/2024)   HEMOGLOBIN A1C  04/07/2024   Medicare Annual Wellness (AWV)  03/25/2025   DEXA SCAN  Completed   Hepatitis B Vaccines  Aged Out   HPV VACCINES  Aged Out   Meningococcal B Vaccine  Aged Out   DTaP/Tdap/Td  Discontinued   Zoster Vaccines- Shingrix  Discontinued    Health Maintenance  Health Maintenance Due  Topic Date Due   Pneumococcal Vaccine: 50+ Years (2 of 2 - PCV) 04/06/2010   COVID-19 Vaccine (4 - 2024-25 season) 04/16/2023   OPHTHALMOLOGY EXAM  07/19/2023   FOOT EXAM  03/23/2024    Colorectal cancer screening: No longer required.   Mammogram status: No longer required due to age.   Lung Cancer Screening: (Low Dose CT Chest recommended if Age 79-80 years, 20 pack-year currently smoking OR have quit w/in 15years.) does not qualify.   Additional Screening:  Hepatitis C Screening: does not qualify; Completed na  Vision Screening: Recommended annual ophthalmology exams for early detection of glaucoma and other disorders of the eye. Is the patient up to date with their annual eye exam?  No  Who is the provider or what is the name of the office in which the patient attends annual eye exams? Does not have If pt is not established with a provider, would they like to be referred to a provider to establish care? Yes . Referral placed   Dental Screening:  Recommended annual dental exams for proper oral hygiene  Diabetic Foot Exam: Diabetic Foot Exam: Completed today  Community Resource Referral / Chronic Care Management: CRR required this visit?  No   CCM required this visit?  No     Plan:     I have personally reviewed and noted the following in the patient's chart:   Medical and social history Use of alcohol , tobacco or illicit drugs  Current medications and supplements including opioid prescriptions. Patient is not currently taking  opioid prescriptions. Functional ability and status Nutritional status Physical activity Advanced directives List of other physicians Hospitalizations, surgeries, and ER visits in previous 12 months Vitals Screenings to include cognitive, depression, and falls Referrals and appointments  In addition, I have reviewed and discussed with patient certain preventive protocols, quality metrics, and best practice recommendations. A written personalized care plan for preventive services as well as general preventive health recommendations were provided to patient.     Harlene MARLA An, NP   03/25/2024

## 2024-03-25 NOTE — Patient Instructions (Signed)
  Melissa Shannon , Thank you for taking time to come for your Medicare Wellness Visit. I appreciate your ongoing commitment to your health goals. Please review the following plan we discussed and let me know if I can assist you in the future.      This is a list of the screening recommended for you and due dates:  Health Maintenance  Topic Date Due   Pneumococcal Vaccine for age over 28 (2 of 2 - PCV) 04/06/2010   COVID-19 Vaccine (4 - 2024-25 season) 04/16/2023   Eye exam for diabetics  07/19/2023   Complete foot exam   03/23/2024   Flu Shot  08/15/2048*   Hemoglobin A1C  04/07/2024   Medicare Annual Wellness Visit  03/25/2025   DEXA scan (bone density measurement)  Completed   Hepatitis B Vaccine  Aged Out   HPV Vaccine  Aged Out   Meningitis B Vaccine  Aged Out   DTaP/Tdap/Td vaccine  Discontinued   Zoster (Shingles) Vaccine  Discontinued  *Topic was postponed. The date shown is not the original due date.

## 2024-03-27 ENCOUNTER — Ambulatory Visit: Payer: Self-pay | Admitting: Nurse Practitioner

## 2024-03-29 ENCOUNTER — Ambulatory Visit: Payer: Medicare PPO | Admitting: Nurse Practitioner

## 2024-03-29 ENCOUNTER — Encounter: Payer: Self-pay | Admitting: Nurse Practitioner

## 2024-03-29 VITALS — BP 126/80 | HR 76 | Temp 97.9°F | Ht 68.0 in | Wt 190.2 lb

## 2024-03-29 DIAGNOSIS — M810 Age-related osteoporosis without current pathological fracture: Secondary | ICD-10-CM | POA: Diagnosis not present

## 2024-03-29 DIAGNOSIS — I5032 Chronic diastolic (congestive) heart failure: Secondary | ICD-10-CM

## 2024-03-29 DIAGNOSIS — E034 Atrophy of thyroid (acquired): Secondary | ICD-10-CM

## 2024-03-29 DIAGNOSIS — G3184 Mild cognitive impairment, so stated: Secondary | ICD-10-CM | POA: Diagnosis not present

## 2024-03-29 DIAGNOSIS — E1122 Type 2 diabetes mellitus with diabetic chronic kidney disease: Secondary | ICD-10-CM | POA: Diagnosis not present

## 2024-03-29 DIAGNOSIS — E785 Hyperlipidemia, unspecified: Secondary | ICD-10-CM | POA: Diagnosis not present

## 2024-03-29 DIAGNOSIS — Z794 Long term (current) use of insulin: Secondary | ICD-10-CM

## 2024-03-29 DIAGNOSIS — Z86711 Personal history of pulmonary embolism: Secondary | ICD-10-CM

## 2024-03-29 DIAGNOSIS — I10 Essential (primary) hypertension: Secondary | ICD-10-CM

## 2024-03-29 DIAGNOSIS — M170 Bilateral primary osteoarthritis of knee: Secondary | ICD-10-CM

## 2024-03-29 DIAGNOSIS — N184 Chronic kidney disease, stage 4 (severe): Secondary | ICD-10-CM

## 2024-03-29 DIAGNOSIS — N1832 Chronic kidney disease, stage 3b: Secondary | ICD-10-CM

## 2024-03-29 MED ORDER — DICLOFENAC SODIUM 1 % EX GEL: 4.0000 g | Freq: Four times a day (QID) | CUTANEOUS | 1 refills | Status: AC

## 2024-03-29 NOTE — Progress Notes (Signed)
 "   Careteam: Patient Care Team: Caro Harlene POUR, NP as PCP - General (Nurse Practitioner) Rolan Ezra RAMAN, MD as PCP - Advanced Heart Failure (Cardiology) Darlean Ozell NOVAK, MD as Consulting Physician (Pulmonary Disease) Rolan Ezra RAMAN, MD as Consulting Physician (Cardiology)  PLACE OF SERVICE:  [facility]  Advanced Directive information    Allergies  Allergen Reactions   Lisinopril  Shortness Of Breath    Tracheal Edema   Penicillins Other (See Comments)    DID THE REACTION INVOLVE: Swelling of the face/tongue/throat, SOB, or low BP? Yes Sudden or severe rash/hives, skin peeling, or the inside of the mouth or nose? No Did it require medical treatment? No When did it last happen?      more than 10 years  If all above answers are NO, may proceed with cephalosporin use.   Throat felt tight    Chief Complaint  Patient presents with   Medical Management of Chronic Issues    Patient states feeling tired and out of breath all the time unless she is sitting or laying down. Need for foot exam and Eye exam     HPI:  Discussed the use of AI scribe software for clinical note transcription with the patient, who gave verbal consent to proceed.  History of Present Illness Melissa Shannon is a 89 year old female who presents for a six-month follow-up.  She feels 'blah' and lacks energy, experiencing difficulty standing and cooking due to fatigue and muscle weakness. Her knees feel sore when walking, though they do not hurt otherwise. She sleeps a few hours at a time and occasionally wakes to use the bathroom. She feels 'semi-depressed' due to recent stressors, including caring for her son who had a thrombus in his lungs and has been staying with her since April.  She has diabetes and is currently taking Lantus  7 units and Tradjenta  5 mg daily. Recent blood work showed a fasting blood sugar that was slightly elevated, but her A1c has improved from 7.4 to 7.1.  She is on  Synthroid  88 mcg daily for thyroid  management, and her TSH levels are within the normal range.  She has chronic kidney disease, which has remained stable.  She takes Norvasc  (amlodipine ) 5 mg daily for blood pressure.   She is also on Crestor  10 mg daily, and her LDL cholesterol is 78.  She has a history of congestive heart failure and is on a daily diuretic.  She takes Aricept  10 mg daily for memory preservation. She has difficulty recalling words and names, which 'pop up later'.  She has a history of pulmonary embolism and is on Eliquis . No abnormal bruising or bleeding, and her hemoglobin levels are normal.  She is on Prolia  injections twice a year for osteoporosis and takes a vitamin D  supplement, currently 2000 IU daily.   Review of Systems:  Review of Systems  Constitutional:  Negative for chills, fever and weight loss.  HENT:  Negative for tinnitus.   Respiratory:  Negative for cough, sputum production and shortness of breath.   Cardiovascular:  Negative for chest pain, palpitations and leg swelling.  Gastrointestinal:  Negative for abdominal pain, constipation, diarrhea and heartburn.  Genitourinary:  Negative for dysuria, frequency and urgency.  Musculoskeletal:  Negative for back pain, falls, joint pain and myalgias.  Skin: Negative.   Neurological:  Negative for dizziness and headaches.  Psychiatric/Behavioral:  Negative for depression and memory loss. The patient does not have insomnia.     Past Medical  History:  Diagnosis Date   Arthritis    bilateral knees   Chronic diastolic CHF (congestive heart failure) (HCC)    CKD (chronic kidney disease), stage III (HCC)    Diabetes mellitus without complication (HCC)    Does use hearing aid    Edema    Hyperlipidemia    Hypertension    Hypothyroidism    Pulmonary embolism (HCC)    Urinary incontinence    Past Surgical History:  Procedure Laterality Date   BLADDER SURGERY  2000   Dr.Tannerbaum (Bladder Tact)    CATARACT EXTRACTION  03/2013   Dr.Shapiro   CHOLECYSTECTOMY     EYE SURGERY     bilateral cataracts   PILONIDAL CYST EXCISION  1952   [facility]    right elbow  1999   x 2, still has one piece of metal in it   TONSILLECTOMY AND ADENOIDECTOMY  1937   Dr.Brewer   Social History:   reports that she has never smoked. She has never used smokeless tobacco. She reports that she does not drink alcohol  and does not use drugs.  Family History  Problem Relation Age of Onset   Suicidality Father    Cancer Brother    Cancer Maternal Grandfather    Diabetes Maternal Grandmother    Melanoma Brother     Medications: Patient's Medications  New Prescriptions   No medications on file  Previous Medications   ACCU-CHEK GUIDE TEST STRIP    USE DAILY TO TEST BLOOD SUGAR (DX:E11.22)   ACCU-CHEK SOFTCLIX LANCETS LANCETS    USE EVERY OTHER DAY TO TEST BLOOD SUGAR   AMLODIPINE  (NORVASC ) 5 MG TABLET    TAKE 1 TABLET BY MOUTH EVERY DAY   BD PEN NEEDLE NANO 2ND GEN 32G X 4 MM MISC    USE EVERY DAY AS DIRECTED   CHOLECALCIFEROL  (VITAMIN D3) 5000 UNITS CHEW    Chew 5,000 Units by mouth daily.    DONEPEZIL  (ARICEPT ) 10 MG TABLET    TAKE 1 TABLET BY MOUTH EVERY DAY AT BEDTIME TO PRESERVE MEMORY   ELIQUIS  5 MG TABS TABLET    TAKE 1 TABLET BY MOUTH TWICE A DAY   FUROSEMIDE  (LASIX ) 20 MG TABLET    TAKE 1 TABLET EVERY DAY   INSULIN  GLARGINE (LANTUS  SOLOSTAR) 100 UNIT/ML SOLOSTAR PEN    INJECT 7 UNITS INTO THE SKIN DAILY.   LEVOTHYROXINE  (SYNTHROID ) 88 MCG TABLET    TAKE 1 TABLET BY MOUTH EVERY DAY BEFORE BREAKFAST   MULTIPLE VITAMIN (MULTIVITAMIN WITH MINERALS) TABS TABLET    Take 1 tablet by mouth daily.   MUPIROCIN OINTMENT (BACTROBAN) 2 %    Apply 1 Application topically daily.   NUTRITIONAL SUPPLEMENTS (GLUCERNA SHAKE PO)    Take by mouth.   ROSUVASTATIN  (CRESTOR ) 10 MG TABLET    TAKE 1 TABLET BY MOUTH EVERY DAY   TRADJENTA  5 MG TABS TABLET    TAKE 1 TABLET BY MOUTH EVERY DAY  Modified Medications    No medications on file  Discontinued Medications   No medications on file    Physical Exam:  Vitals:   03/29/24 1019  BP: 126/80  Pulse: 76  Temp: 97.9 F (36.6 C)  SpO2: 95%  Weight: 190 lb 3.2 oz (86.3 kg)  Height: 5' 8 (1.727 m)   Body mass index is 28.92 kg/m. Wt Readings from Last 3 Encounters:  03/29/24 190 lb 3.2 oz (86.3 kg)  11/15/23 195 lb 6.4 oz (88.6 kg)  09/06/23 199 lb  6.4 oz (90.4 kg)    Physical Exam Constitutional:      General: She is not in acute distress.    Appearance: She is well-developed. She is not diaphoretic.  HENT:     Head: Normocephalic and atraumatic.     Mouth/Throat:     Pharynx: No oropharyngeal exudate.  Eyes:     Conjunctiva/sclera: Conjunctivae normal.     Pupils: Pupils are equal, round, and reactive to light.  Cardiovascular:     Rate and Rhythm: Normal rate and regular rhythm.     Heart sounds: Normal heart sounds.  Pulmonary:     Effort: Pulmonary effort is normal.     Breath sounds: Normal breath sounds.  Abdominal:     General: Bowel sounds are normal.     Palpations: Abdomen is soft.  Musculoskeletal:     Cervical back: Normal range of motion and neck supple.     Right lower leg: No edema.     Left lower leg: No edema.  Skin:    General: Skin is warm and dry.  Neurological:     Mental Status: She is alert.  Psychiatric:        Mood and Affect: Mood normal.     Labs reviewed: Basic Metabolic Panel: Recent Labs    10/09/23 0956 03/25/24 0934  NA 143 142  K 4.4 4.0  CL 103 103  CO2 32 30  GLUCOSE 138* 141*  BUN 30* 31*  CREATININE 1.22* 1.30*  CALCIUM  10.8* 10.4  TSH  --  2.02   Liver Function Tests: Recent Labs    10/09/23 0956 03/25/24 0934  AST 18 15  ALT 17 12  BILITOT 0.6 0.6  PROT 6.2 6.3   No results for input(s): LIPASE, AMYLASE in the last 8760 hours. No results for input(s): AMMONIA in the last 8760 hours. CBC: Recent Labs    10/09/23 0956 03/25/24 0934  WBC 8.6 8.3   NEUTROABS 4,816 4,714  HGB 15.0 14.4  HCT 46.5* 47.4*  MCV 93.6 96.0  PLT 194 241   Lipid Panel: Recent Labs    03/25/24 0934  CHOL 165  HDL 62  LDLCALC 78  TRIG 148  CHOLHDL 2.7   TSH: Recent Labs    03/25/24 0934  TSH 2.02   A1C: Lab Results  Component Value Date   HGBA1C 7.1 (H) 03/25/2024     Assessment/Plan  Hypothyroidism due to acquired atrophy of thyroid  Assessment & Plan: TSH at goal on synthroid  88 mcg   Type 2 diabetes mellitus with stage 3b chronic kidney disease, with long-term current use of insulin  (HCC) Assessment & Plan: A1c at goal, continue current medications and encouraged dietary compliance, routine foot care/monitoring and to keep up with diabetic eye exams through ophthalmology     Primary hypertension Assessment & Plan: Blood pressure well controlled, goal bp <140/90 Continue current medications and dietary modifications follow metabolic panel    Chronic diastolic CHF (congestive heart failure) (HCC) Assessment & Plan: Euvolemic, continues on lasix    MCI (mild cognitive impairment) Assessment & Plan: Ongoing and stable, continues on aricept    CKD (chronic kidney disease) stage 4, GFR 15-29 ml/min (HCC) Assessment & Plan: Chronic and stable Encourage proper hydration Follow metabolic panel Avoid nephrotoxic meds (NSAIDS)    Hyperlipidemia, unspecified hyperlipidemia type Assessment & Plan: Continues on crestor  10 mg daily with dietary with dietary modifications Follow lipids   Hx of pulmonary embolus Assessment & Plan: Continues on eliquis  5 mg BID  Osteoporosis, unspecified osteoporosis type, unspecified pathological fracture presence Assessment & Plan: Continues on prolia , cal and vit d   Primary osteoarthritis of both knees Assessment & Plan: Worsening knee pain. Tylenol  PRN and voltaren  gel PRN  Orders: -     Diclofenac  Sodium; Apply 4 g topically 4 (four) times daily.  Dispense: 120 g; Refill:  1     Return in about 4 months (around 07/29/2024) for routine follow up.  Roth Ress K. Caro BODILY [facility] [phone]  "

## 2024-03-29 NOTE — Patient Instructions (Signed)
 Voltaren  gel to knees- up to 4 times daily as needed

## 2024-04-01 DIAGNOSIS — M170 Bilateral primary osteoarthritis of knee: Secondary | ICD-10-CM | POA: Insufficient documentation

## 2024-04-01 DIAGNOSIS — Z86711 Personal history of pulmonary embolism: Secondary | ICD-10-CM | POA: Insufficient documentation

## 2024-04-01 DIAGNOSIS — E034 Atrophy of thyroid (acquired): Secondary | ICD-10-CM | POA: Insufficient documentation

## 2024-04-01 DIAGNOSIS — E1122 Type 2 diabetes mellitus with diabetic chronic kidney disease: Secondary | ICD-10-CM | POA: Insufficient documentation

## 2024-04-01 DIAGNOSIS — M810 Age-related osteoporosis without current pathological fracture: Secondary | ICD-10-CM | POA: Insufficient documentation

## 2024-04-01 NOTE — Assessment & Plan Note (Signed)
 Continues on prolia , cal and vit d

## 2024-04-01 NOTE — Assessment & Plan Note (Signed)
 Blood pressure well controlled, goal bp <140/90 Continue current medications and dietary modifications follow metabolic panel

## 2024-04-01 NOTE — Assessment & Plan Note (Signed)
 Continues on crestor  10 mg daily with dietary with dietary modifications Follow lipids

## 2024-04-01 NOTE — Assessment & Plan Note (Signed)
 Continues on eliquis  5 mg BID

## 2024-04-01 NOTE — Assessment & Plan Note (Signed)
 Euvolemic, continues on lasix

## 2024-04-01 NOTE — Assessment & Plan Note (Signed)
 Chronic and stable Encourage proper hydration Follow metabolic panel Avoid nephrotoxic meds (NSAIDS)

## 2024-04-01 NOTE — Assessment & Plan Note (Signed)
 TSH at goal on synthroid  88 mcg

## 2024-04-01 NOTE — Assessment & Plan Note (Signed)
 Worsening knee pain. Tylenol  PRN and voltaren  gel PRN

## 2024-04-01 NOTE — Assessment & Plan Note (Signed)
 A1c at goal, continue current medications and encouraged dietary compliance, routine foot care/monitoring and to keep up with diabetic eye exams through ophthalmology

## 2024-04-01 NOTE — Assessment & Plan Note (Signed)
 Ongoing and stable, continues on aricept 

## 2024-04-19 ENCOUNTER — Ambulatory Visit: Admitting: Nurse Practitioner

## 2024-04-19 DIAGNOSIS — M810 Age-related osteoporosis without current pathological fracture: Secondary | ICD-10-CM | POA: Diagnosis not present

## 2024-04-19 MED ORDER — DENOSUMAB 60 MG/ML ~~LOC~~ SOSY: 60.0000 mg | PREFILLED_SYRINGE | SUBCUTANEOUS | Status: AC

## 2024-04-19 NOTE — Progress Notes (Signed)
 Patient is in office today for a nurse visit for  Prolia Injection . Patient Injection was given in the  Right arm. Patient tolerated injection well.

## 2024-04-20 ENCOUNTER — Other Ambulatory Visit: Payer: Self-pay | Admitting: Nurse Practitioner

## 2024-04-20 DIAGNOSIS — R413 Other amnesia: Secondary | ICD-10-CM

## 2024-06-09 ENCOUNTER — Other Ambulatory Visit: Payer: Self-pay | Admitting: Nurse Practitioner

## 2024-07-29 ENCOUNTER — Encounter: Payer: Self-pay | Admitting: Nurse Practitioner

## 2024-07-29 NOTE — Patient Instructions (Signed)
 Please visit your local pharmacy to receive your covid vaccine, if you have not already received.     Labs will be done on January 13, 2025 at [facility] at 7:45 am

## 2024-07-29 NOTE — Progress Notes (Signed)
 This encounter was created in error - please disregard.

## 2024-08-04 ENCOUNTER — Other Ambulatory Visit: Payer: Self-pay | Admitting: Nurse Practitioner

## 2024-08-05 NOTE — Telephone Encounter (Signed)
 High risk warning populated when attempting to refill medications, will send to provider for pending  review and approval

## 2024-09-02 ENCOUNTER — Encounter: Payer: Self-pay | Admitting: Nurse Practitioner

## 2024-09-02 ENCOUNTER — Ambulatory Visit: Admitting: Nurse Practitioner

## 2024-09-02 VITALS — BP 122/78 | HR 97 | Temp 96.0°F | Resp 18 | Ht 68.0 in | Wt 194.6 lb

## 2024-09-02 DIAGNOSIS — Z794 Long term (current) use of insulin: Secondary | ICD-10-CM

## 2024-09-02 DIAGNOSIS — R5381 Other malaise: Secondary | ICD-10-CM | POA: Diagnosis not present

## 2024-09-02 DIAGNOSIS — M810 Age-related osteoporosis without current pathological fracture: Secondary | ICD-10-CM | POA: Diagnosis not present

## 2024-09-02 DIAGNOSIS — I10 Essential (primary) hypertension: Secondary | ICD-10-CM | POA: Diagnosis not present

## 2024-09-02 DIAGNOSIS — E785 Hyperlipidemia, unspecified: Secondary | ICD-10-CM | POA: Diagnosis not present

## 2024-09-02 DIAGNOSIS — N1832 Chronic kidney disease, stage 3b: Secondary | ICD-10-CM

## 2024-09-02 DIAGNOSIS — E1122 Type 2 diabetes mellitus with diabetic chronic kidney disease: Secondary | ICD-10-CM | POA: Diagnosis not present

## 2024-09-02 DIAGNOSIS — H6123 Impacted cerumen, bilateral: Secondary | ICD-10-CM | POA: Diagnosis not present

## 2024-09-02 DIAGNOSIS — I5032 Chronic diastolic (congestive) heart failure: Secondary | ICD-10-CM | POA: Diagnosis not present

## 2024-09-02 DIAGNOSIS — E034 Atrophy of thyroid (acquired): Secondary | ICD-10-CM | POA: Diagnosis not present

## 2024-09-02 DIAGNOSIS — N184 Chronic kidney disease, stage 4 (severe): Secondary | ICD-10-CM

## 2024-09-02 DIAGNOSIS — M170 Bilateral primary osteoarthritis of knee: Secondary | ICD-10-CM | POA: Diagnosis not present

## 2024-09-02 NOTE — Progress Notes (Unsigned)
 "   Careteam: Patient Care Team: Melissa Harlene POUR, NP as PCP - General (Nurse Practitioner) Rolan Ezra RAMAN, MD as PCP - Advanced Heart Failure (Cardiology) Darlean Ozell NOVAK, MD as Consulting Physician (Pulmonary Disease) Rolan Ezra RAMAN, MD as Consulting Physician (Cardiology)  PLACE OF SERVICE:  [facility]  Advanced Directive information    Allergies[1]  Chief Complaint  Patient presents with   Medical Management of Chronic Issues    4 months for routine follow up.      HPI:  Discussed the use of AI scribe software for clinical note transcription with the patient, who gave verbal consent to proceed.  History of Present Illness Melissa Shannon is a 89 year old female with diabetes and congestive heart failure who presents for a four-month follow-up and ear wax removal.  She is experiencing difficulty walking and feels weak, with a sensation of potentially 'dropping down.' There is soreness in both knees when moving, although she is pain-free while sitting. She has a history of knee pain, and her daughter mentions receiving injections for her own knee issues, which have been beneficial.  She has a history of diabetes and is currently taking Lantus  7 units nightly and Tradjenta  5 mg daily. No low blood sugars or symptoms such as shakiness or jitteriness. Her A1c was 7.1, and she checks her blood sugars at home regularly.  She is on Synthroid  88 mcg for thyroid  management, with a TSH level that was stable in August. She also takes Crestor  10 mg daily for cholesterol management, with an LDL level of 78.  She has chronic kidney disease and takes Lasix  20 mg daily for swelling in the legs. There is persistent leg swelling, though it is improved compared to before. She experiences shortness of breath, which has worsened, particularly after walking or exertion. Her daughter notes she becomes winded more easily than usual.  No changes in bowel movements, abnormal bruising, or  bleeding.  ***  Review of Systems:  ROS***  Past Medical History:  Diagnosis Date   Arthritis    bilateral knees   Chronic diastolic CHF (congestive heart failure) (HCC)    CKD (chronic kidney disease), stage III (HCC)    Diabetes mellitus without complication (HCC)    Does use hearing aid    Edema    Hyperlipidemia    Hypertension    Hypothyroidism    Pulmonary embolism (HCC)    Urinary incontinence    Past Surgical History:  Procedure Laterality Date   BLADDER SURGERY  2000   Dr.Tannerbaum (Bladder Tact)   CATARACT EXTRACTION  03/2013   Dr.Shapiro   CHOLECYSTECTOMY     EYE SURGERY     bilateral cataracts   PILONIDAL CYST EXCISION  1952   [facility]    right elbow  1999   x 2, still has one piece of metal in it   TONSILLECTOMY AND ADENOIDECTOMY  1937   Dr.Brewer   Social History:   reports that she has never smoked. She has never used smokeless tobacco. She reports that she does not drink alcohol  and does not use drugs.  Family History  Problem Relation Age of Onset   Suicidality Father    Cancer Brother    Cancer Maternal Grandfather    Diabetes Maternal Grandmother    Melanoma Brother     Medications: Patient's Medications  New Prescriptions   No medications on file  Previous Medications   ACCU-CHEK GUIDE TEST STRIP    USE DAILY TO TEST BLOOD  SUGAR (DX:E11.22)   ACCU-CHEK SOFTCLIX LANCETS LANCETS    USE EVERY OTHER DAY TO TEST BLOOD SUGAR   AMLODIPINE  (NORVASC ) 5 MG TABLET    TAKE 1 TABLET BY MOUTH EVERY DAY   BD PEN NEEDLE NANO 2ND GEN 32G X 4 MM MISC    USE EVERY DAY AS DIRECTED   CHOLECALCIFEROL  (VITAMIN D3) 5000 UNITS CHEW    Chew 5,000 Units by mouth daily.    DICLOFENAC  SODIUM (VOLTAREN ) 1 % GEL    Apply 4 g topically 4 (four) times daily.   DONEPEZIL  (ARICEPT ) 10 MG TABLET    TAKE 1 TABLET BY MOUTH EVERY DAY AT BEDTIME TO PRESERVE MEMORY   ELIQUIS  5 MG TABS TABLET    TAKE 1 TABLET BY MOUTH TWICE A DAY   FUROSEMIDE  (LASIX ) 20 MG TABLET     TAKE 1 TABLET EVERY DAY   INSULIN  GLARGINE (LANTUS  SOLOSTAR) 100 UNIT/ML SOLOSTAR PEN    INJECT 7 UNITS INTO THE SKIN DAILY.   LEVOTHYROXINE  (SYNTHROID ) 88 MCG TABLET    TAKE 1 TABLET BY MOUTH EVERY DAY BEFORE BREAKFAST   MULTIPLE VITAMIN (MULTIVITAMIN WITH MINERALS) TABS TABLET    Take 1 tablet by mouth daily.   MUPIROCIN OINTMENT (BACTROBAN) 2 %    Apply 1 Application topically daily.   NUTRITIONAL SUPPLEMENTS (GLUCERNA SHAKE PO)    Take by mouth.   ROSUVASTATIN  (CRESTOR ) 10 MG TABLET    TAKE 1 TABLET BY MOUTH EVERY DAY   TRADJENTA  5 MG TABS TABLET    TAKE 1 TABLET BY MOUTH EVERY DAY  Modified Medications   No medications on file  Discontinued Medications   No medications on file    Physical Exam:  Vitals:   09/02/24 1148  BP: 122/78  Pulse: 97  Resp: 18  Temp: (!) 96 F (35.6 C)  SpO2: 98%  Weight: 194 lb 9.6 oz (88.3 kg)  Height: 5' 8 (1.727 m)   Body mass index is 29.59 kg/m. Wt Readings from Last 3 Encounters:  09/02/24 194 lb 9.6 oz (88.3 kg)  03/29/24 190 lb 3.2 oz (86.3 kg)  11/15/23 195 lb 6.4 oz (88.6 kg)    Physical Exam***  Labs reviewed: Basic Metabolic Panel: Recent Labs    10/09/23 0956 03/25/24 0934  NA 143 142  K 4.4 4.0  CL 103 103  CO2 32 30  GLUCOSE 138* 141*  BUN 30* 31*  CREATININE 1.22* 1.30*  CALCIUM  10.8* 10.4  TSH  --  2.02   Liver Function Tests: Recent Labs    10/09/23 0956 03/25/24 0934  AST 18 15  ALT 17 12  BILITOT 0.6 0.6  PROT 6.2 6.3   No results for input(s): LIPASE, AMYLASE in the last 8760 hours. No results for input(s): AMMONIA in the last 8760 hours. CBC: Recent Labs    10/09/23 0956 03/25/24 0934  WBC 8.6 8.3  NEUTROABS 4,816 4,714  HGB 15.0 14.4  HCT 46.5* 47.4*  MCV 93.6 96.0  PLT 194 241   Lipid Panel: Recent Labs    03/25/24 0934  CHOL 165  HDL 62  LDLCALC 78  TRIG 148  CHOLHDL 2.7   TSH: Recent Labs    03/25/24 0934  TSH 2.02   A1C: Lab Results  Component Value Date    HGBA1C 7.1 (H) 03/25/2024     Assessment/Plan *** There are no diagnoses linked to this encounter.   No follow-ups on file.: ***  Arion Shankles K. Melissa BODILY [facility] [phone]     [1]  Allergies Allergen Reactions   Lisinopril  Shortness Of Breath    Tracheal Edema   Penicillins Other (See Comments)    DID THE REACTION INVOLVE: Swelling of the face/tongue/throat, SOB, or low BP? Yes Sudden or severe rash/hives, skin peeling, or the inside of the mouth or nose? No Did it require medical treatment? No When did it last happen?      more than 10 years  If all above answers are NO, may proceed with cephalosporin use.   Throat felt tight   "

## 2024-09-03 ENCOUNTER — Ambulatory Visit: Payer: Self-pay | Admitting: Nurse Practitioner

## 2024-09-03 LAB — CBC WITH DIFFERENTIAL/PLATELET
Absolute Lymphocytes: 2937 {cells}/uL (ref 850–3900)
Absolute Monocytes: 668 {cells}/uL (ref 200–950)
Basophils Absolute: 71 {cells}/uL (ref 0–200)
Basophils Relative: 0.8 %
Eosinophils Absolute: 196 {cells}/uL (ref 15–500)
Eosinophils Relative: 2.2 %
HCT: 44.6 % (ref 35.9–46.0)
Hemoglobin: 14.1 g/dL (ref 11.7–15.5)
MCH: 29.7 pg (ref 27.0–33.0)
MCHC: 31.6 g/dL (ref 31.6–35.4)
MCV: 93.9 fL (ref 81.4–101.7)
MPV: 11.9 fL (ref 7.5–12.5)
Monocytes Relative: 7.5 %
Neutro Abs: 5029 {cells}/uL (ref 1500–7800)
Neutrophils Relative %: 56.5 %
Platelets: 217 Thousand/uL (ref 140–400)
RBC: 4.75 Million/uL (ref 3.80–5.10)
RDW: 12.1 % (ref 11.0–15.0)
Total Lymphocyte: 33 %
WBC: 8.9 Thousand/uL (ref 3.8–10.8)

## 2024-09-03 LAB — COMPREHENSIVE METABOLIC PANEL WITH GFR
AG Ratio: 1.6 (calc) (ref 1.0–2.5)
ALT: 15 U/L (ref 6–29)
AST: 17 U/L (ref 10–35)
Albumin: 4 g/dL (ref 3.6–5.1)
Alkaline phosphatase (APISO): 56 U/L (ref 37–153)
BUN/Creatinine Ratio: 33 (calc) — ABNORMAL HIGH (ref 6–22)
BUN: 38 mg/dL — ABNORMAL HIGH (ref 7–25)
CO2: 31 mmol/L (ref 20–32)
Calcium: 10.4 mg/dL (ref 8.6–10.4)
Chloride: 101 mmol/L (ref 98–110)
Creat: 1.15 mg/dL — ABNORMAL HIGH (ref 0.60–0.95)
Globulin: 2.5 g/dL (ref 1.9–3.7)
Glucose, Bld: 156 mg/dL — ABNORMAL HIGH (ref 65–139)
Potassium: 4.7 mmol/L (ref 3.5–5.3)
Sodium: 141 mmol/L (ref 135–146)
Total Bilirubin: 0.4 mg/dL (ref 0.2–1.2)
Total Protein: 6.5 g/dL (ref 6.1–8.1)
eGFR: 44 mL/min/1.73m2 — ABNORMAL LOW

## 2024-09-03 LAB — HEMOGLOBIN A1C
Hgb A1c MFr Bld: 7 % — ABNORMAL HIGH
Mean Plasma Glucose: 154 mg/dL
eAG (mmol/L): 8.5 mmol/L

## 2024-09-04 ENCOUNTER — Telehealth: Payer: Self-pay | Admitting: *Deleted

## 2024-09-04 NOTE — Telephone Encounter (Signed)
 Received [employer] Prolia  Verification.  Copay $80 and needs Prior Auth through [place]. Will obtain.

## 2024-09-05 NOTE — Telephone Encounter (Signed)
 [employer] Prior Authorization was APPROVED 08/15/2024-08/14/2025 Reference #:[id number]

## 2024-09-27 ENCOUNTER — Ambulatory Visit: Admitting: Physician Assistant

## 2024-10-18 ENCOUNTER — Ambulatory Visit

## 2025-01-13 ENCOUNTER — Ambulatory Visit: Admitting: Nurse Practitioner

## 2025-03-28 ENCOUNTER — Encounter: Payer: Self-pay | Admitting: Nurse Practitioner
